# Patient Record
Sex: Female | Born: 1965
Health system: Southern US, Community
[De-identification: ages and names within clinical notes are randomized; demographics above are authoritative.]

## PROBLEM LIST (undated history)

## (undated) ENCOUNTER — Emergency Department (HOSPITAL_COMMUNITY): Admission: EM | Payer: Medicare Other | Source: Home / Self Care

## (undated) DIAGNOSIS — M109 Gout, unspecified: Secondary | ICD-10-CM

## (undated) DIAGNOSIS — R188 Other ascites: Secondary | ICD-10-CM

## (undated) DIAGNOSIS — J309 Allergic rhinitis, unspecified: Secondary | ICD-10-CM

## (undated) DIAGNOSIS — G2581 Restless legs syndrome: Secondary | ICD-10-CM

## (undated) DIAGNOSIS — F329 Major depressive disorder, single episode, unspecified: Secondary | ICD-10-CM

## (undated) DIAGNOSIS — I499 Cardiac arrhythmia, unspecified: Secondary | ICD-10-CM

## (undated) DIAGNOSIS — I10 Essential (primary) hypertension: Secondary | ICD-10-CM

## (undated) DIAGNOSIS — G250 Essential tremor: Secondary | ICD-10-CM

## (undated) DIAGNOSIS — F32A Depression, unspecified: Secondary | ICD-10-CM

## (undated) DIAGNOSIS — I4891 Unspecified atrial fibrillation: Secondary | ICD-10-CM

## (undated) DIAGNOSIS — G629 Polyneuropathy, unspecified: Secondary | ICD-10-CM

## (undated) DIAGNOSIS — F4310 Post-traumatic stress disorder, unspecified: Secondary | ICD-10-CM

## (undated) DIAGNOSIS — G43909 Migraine, unspecified, not intractable, without status migrainosus: Secondary | ICD-10-CM

## (undated) DIAGNOSIS — M1990 Unspecified osteoarthritis, unspecified site: Secondary | ICD-10-CM

## (undated) DIAGNOSIS — N1330 Unspecified hydronephrosis: Secondary | ICD-10-CM

## (undated) DIAGNOSIS — C189 Malignant neoplasm of colon, unspecified: Secondary | ICD-10-CM

## (undated) DIAGNOSIS — K5792 Diverticulitis of intestine, part unspecified, without perforation or abscess without bleeding: Secondary | ICD-10-CM

## (undated) DIAGNOSIS — F419 Anxiety disorder, unspecified: Secondary | ICD-10-CM

## (undated) DIAGNOSIS — K219 Gastro-esophageal reflux disease without esophagitis: Secondary | ICD-10-CM

## (undated) DIAGNOSIS — Z9221 Personal history of antineoplastic chemotherapy: Secondary | ICD-10-CM

## (undated) DIAGNOSIS — R600 Localized edema: Secondary | ICD-10-CM

## (undated) HISTORY — DX: Anxiety disorder, unspecified: F41.9

## (undated) HISTORY — PX: TUBAL LIGATION: SHX77

## (undated) HISTORY — DX: Other ascites: R18.8

## (undated) HISTORY — DX: Polyneuropathy, unspecified: G62.9

## (undated) HISTORY — DX: Allergic rhinitis, unspecified: J30.9

## (undated) HISTORY — DX: Migraine, unspecified, not intractable, without status migrainosus: G43.909

## (undated) HISTORY — DX: Localized edema: R60.0

## (undated) HISTORY — PX: OTHER SURGICAL HISTORY: SHX169

## (undated) HISTORY — PX: CARDIOVASCULAR STRESS TEST: SHX262

## (undated) HISTORY — DX: Depression, unspecified: F32.A

## (undated) HISTORY — DX: Major depressive disorder, single episode, unspecified: F32.9

## (undated) HISTORY — DX: Unspecified osteoarthritis, unspecified site: M19.90

## (undated) HISTORY — DX: Gastro-esophageal reflux disease without esophagitis: K21.9

## (undated) HISTORY — PX: APPENDECTOMY: SHX54

## (undated) HISTORY — DX: Gout, unspecified: M10.9

## (undated) HISTORY — DX: Unspecified hydronephrosis: N13.30

## (undated) HISTORY — DX: Post-traumatic stress disorder, unspecified: F43.10

## (undated) HISTORY — DX: Restless legs syndrome: G25.81

## (undated) HISTORY — PX: TRANSTHORACIC ECHOCARDIOGRAM: SHX275

## (undated) HISTORY — DX: Essential tremor: G25.0

## (undated) HISTORY — DX: Essential (primary) hypertension: I10

---

## 1999-09-04 ENCOUNTER — Emergency Department (HOSPITAL_COMMUNITY): Admission: EM | Admit: 1999-09-04 | Discharge: 1999-09-04 | Payer: Self-pay | Admitting: Emergency Medicine

## 2000-06-22 ENCOUNTER — Emergency Department (HOSPITAL_COMMUNITY): Admission: EM | Admit: 2000-06-22 | Discharge: 2000-06-22 | Payer: Self-pay | Admitting: Emergency Medicine

## 2000-06-22 ENCOUNTER — Encounter: Payer: Self-pay | Admitting: Emergency Medicine

## 2001-09-21 ENCOUNTER — Emergency Department (HOSPITAL_COMMUNITY): Admission: EM | Admit: 2001-09-21 | Discharge: 2001-09-21 | Payer: Self-pay | Admitting: Emergency Medicine

## 2002-07-07 ENCOUNTER — Emergency Department (HOSPITAL_COMMUNITY): Admission: EM | Admit: 2002-07-07 | Discharge: 2002-07-07 | Payer: Self-pay | Admitting: Emergency Medicine

## 2003-04-07 ENCOUNTER — Emergency Department (HOSPITAL_COMMUNITY): Admission: EM | Admit: 2003-04-07 | Discharge: 2003-04-07 | Payer: Self-pay | Admitting: Emergency Medicine

## 2003-06-28 ENCOUNTER — Emergency Department (HOSPITAL_COMMUNITY): Admission: EM | Admit: 2003-06-28 | Discharge: 2003-06-28 | Payer: Self-pay | Admitting: Emergency Medicine

## 2004-11-16 ENCOUNTER — Emergency Department (HOSPITAL_COMMUNITY): Admission: EM | Admit: 2004-11-16 | Discharge: 2004-11-16 | Payer: Self-pay | Admitting: Emergency Medicine

## 2005-01-28 ENCOUNTER — Emergency Department (HOSPITAL_COMMUNITY): Admission: EM | Admit: 2005-01-28 | Discharge: 2005-01-28 | Payer: Self-pay | Admitting: Emergency Medicine

## 2006-04-17 ENCOUNTER — Emergency Department: Payer: Self-pay | Admitting: Emergency Medicine

## 2006-07-02 ENCOUNTER — Emergency Department: Payer: Self-pay | Admitting: Unknown Physician Specialty

## 2007-01-18 ENCOUNTER — Emergency Department (HOSPITAL_COMMUNITY): Admission: EM | Admit: 2007-01-18 | Discharge: 2007-01-18 | Payer: Self-pay | Admitting: Emergency Medicine

## 2007-09-04 ENCOUNTER — Emergency Department (HOSPITAL_COMMUNITY): Admission: EM | Admit: 2007-09-04 | Discharge: 2007-09-04 | Payer: Self-pay | Admitting: Emergency Medicine

## 2008-04-22 ENCOUNTER — Emergency Department (HOSPITAL_COMMUNITY): Admission: EM | Admit: 2008-04-22 | Discharge: 2008-04-22 | Payer: Self-pay | Admitting: Emergency Medicine

## 2008-10-03 ENCOUNTER — Emergency Department (HOSPITAL_COMMUNITY): Admission: EM | Admit: 2008-10-03 | Discharge: 2008-10-03 | Payer: Self-pay | Admitting: Emergency Medicine

## 2008-10-09 ENCOUNTER — Emergency Department (HOSPITAL_COMMUNITY): Admission: EM | Admit: 2008-10-09 | Discharge: 2008-10-09 | Payer: Self-pay | Admitting: Emergency Medicine

## 2009-06-01 ENCOUNTER — Emergency Department (HOSPITAL_COMMUNITY): Admission: EM | Admit: 2009-06-01 | Discharge: 2009-06-01 | Payer: Self-pay | Admitting: Emergency Medicine

## 2010-05-21 LAB — TYPE AND SCREEN: Antibody Screen: NEGATIVE

## 2010-05-21 LAB — COMPREHENSIVE METABOLIC PANEL
ALT: 21 U/L (ref 0–35)
Alkaline Phosphatase: 53 U/L (ref 39–117)
CO2: 23 mEq/L (ref 19–32)
Calcium: 9.3 mg/dL (ref 8.4–10.5)
Creatinine, Ser: 0.68 mg/dL (ref 0.4–1.2)
GFR calc Af Amer: >60 mL/min (ref >60)
Potassium: 3.8 mEq/L (ref 3.5–5.1)
Total Bilirubin: 0.5 mg/dL (ref 0.3–1.2)

## 2010-05-21 LAB — URINALYSIS, ROUTINE W REFLEX MICROSCOPIC
Bilirubin Urine: NEGATIVE
Glucose, UA: NEGATIVE mg/dL
Leukocytes, UA: NEGATIVE
Nitrite: NEGATIVE
Protein, ur: NEGATIVE mg/dL

## 2010-05-21 LAB — POCT I-STAT, CHEM 8
Chloride: 111 mEq/L (ref 96–112)
Sodium: 144 mEq/L (ref 135–145)

## 2010-05-21 LAB — URINE MICROSCOPIC-ADD ON

## 2010-05-21 LAB — LACTIC ACID, PLASMA: Lactic Acid, Venous: 1.4 mmol/L (ref 0.5–2.2)

## 2010-05-21 LAB — CBC
MCHC: 34.4 g/dL (ref 30.0–36.0)
Platelets: 352 10*3/uL (ref 150–400)
RBC: 4.66 MIL/uL (ref 3.87–5.11)

## 2010-05-21 LAB — PROTIME-INR
INR: 1.04 (ref 0.00–1.49)
Prothrombin Time: 13.5 seconds (ref 11.6–15.2)

## 2010-05-21 LAB — APTT: aPTT: 23 seconds — ABNORMAL LOW (ref 24–37)

## 2010-06-07 LAB — URINALYSIS, ROUTINE W REFLEX MICROSCOPIC
Hgb urine dipstick: NEGATIVE
Ketones, ur: NEGATIVE mg/dL
Nitrite: NEGATIVE
Protein, ur: NEGATIVE mg/dL
Specific Gravity, Urine: 1.021 (ref 1.005–1.030)
Urobilinogen, UA: 0.2 mg/dL (ref 0.0–1.0)

## 2010-06-07 LAB — POCT I-STAT, CHEM 8
Calcium, Ion: 1.19 mmol/L (ref 1.12–1.32)
Creatinine, Ser: 0.9 mg/dL (ref 0.4–1.2)
Glucose, Bld: 92 mg/dL (ref 70–99)
HCT: 40 % (ref 36.0–46.0)
Hemoglobin: 13.6 g/dL (ref 12.0–15.0)
Potassium: 3.6 mEq/L (ref 3.5–5.1)
TCO2: 23 mmol/L (ref 0–100)

## 2010-06-07 LAB — POCT CARDIAC MARKERS: Myoglobin, poc: 52.9 ng/mL (ref 12–200)

## 2010-06-15 ENCOUNTER — Emergency Department (HOSPITAL_COMMUNITY)
Admission: EM | Admit: 2010-06-15 | Discharge: 2010-06-15 | Disposition: A | Discharge status: Home / Self Care | Payer: Self-pay | Attending: Emergency Medicine | Admitting: Emergency Medicine

## 2010-06-15 DIAGNOSIS — R0989 Other specified symptoms and signs involving the circulatory and respiratory systems: Secondary | ICD-10-CM | POA: Insufficient documentation

## 2010-06-15 DIAGNOSIS — R0609 Other forms of dyspnea: Secondary | ICD-10-CM | POA: Insufficient documentation

## 2010-06-15 DIAGNOSIS — R112 Nausea with vomiting, unspecified: Secondary | ICD-10-CM | POA: Insufficient documentation

## 2010-06-15 DIAGNOSIS — J45909 Unspecified asthma, uncomplicated: Secondary | ICD-10-CM | POA: Insufficient documentation

## 2010-06-15 DIAGNOSIS — R059 Cough, unspecified: Secondary | ICD-10-CM | POA: Insufficient documentation

## 2010-06-15 DIAGNOSIS — R05 Cough: Secondary | ICD-10-CM | POA: Insufficient documentation

## 2010-06-15 DIAGNOSIS — J3489 Other specified disorders of nose and nasal sinuses: Secondary | ICD-10-CM | POA: Insufficient documentation

## 2010-06-15 DIAGNOSIS — R070 Pain in throat: Secondary | ICD-10-CM | POA: Insufficient documentation

## 2010-06-15 DIAGNOSIS — R509 Fever, unspecified: Secondary | ICD-10-CM | POA: Insufficient documentation

## 2011-02-18 ENCOUNTER — Ambulatory Visit (HOSPITAL_COMMUNITY)
Admission: EM | Admit: 2011-02-18 | Discharge: 2011-02-18 | Discharge status: Against Medical Advice | Payer: Managed Care, Other (non HMO) | Attending: Emergency Medicine | Admitting: Emergency Medicine

## 2011-02-18 ENCOUNTER — Other Ambulatory Visit: Payer: Self-pay

## 2011-02-18 DIAGNOSIS — R079 Chest pain, unspecified: Secondary | ICD-10-CM | POA: Insufficient documentation

## 2011-02-19 ENCOUNTER — Emergency Department (INDEPENDENT_AMBULATORY_CARE_PROVIDER_SITE_OTHER)
Admission: EM | Admit: 2011-02-19 | Discharge: 2011-02-19 | Disposition: A | Discharge status: Home / Self Care | Payer: Managed Care, Other (non HMO) | Source: Home / Self Care | Attending: Family Medicine | Admitting: Family Medicine

## 2011-02-19 ENCOUNTER — Encounter: Payer: Self-pay | Admitting: Emergency Medicine

## 2011-02-19 DIAGNOSIS — R6889 Other general symptoms and signs: Secondary | ICD-10-CM

## 2011-02-19 HISTORY — DX: Unspecified atrial fibrillation: I48.91

## 2011-02-19 MED ORDER — ONDANSETRON HCL 4 MG PO TABS: 4.0000 mg | ORAL_TABLET | Freq: Three times a day (TID) | ORAL | Status: AC | PRN

## 2011-02-19 MED ORDER — ONDANSETRON 4 MG PO TBDP
ORAL_TABLET | ORAL | Status: AC
  Filled 2011-02-19: qty 1

## 2011-02-19 MED ORDER — GUAIFENESIN-CODEINE 100-10 MG/5ML PO SYRP: 5.0000 mL | ORAL_SOLUTION | Freq: Four times a day (QID) | ORAL | Status: AC | PRN

## 2011-02-19 MED ORDER — FLUTICASONE PROPIONATE 50 MCG/ACT NA SUSP: 2.0000 | Freq: Every day | NASAL | Status: DC

## 2011-02-19 MED ORDER — ONDANSETRON 4 MG PO TBDP
4.0000 mg | ORAL_TABLET | Freq: Once | ORAL | Status: AC
  Administered 2011-02-19: 4 mg via ORAL

## 2011-02-19 MED ORDER — ONDANSETRON HCL 4 MG PO TABS: 4.0000 mg | ORAL_TABLET | Freq: Three times a day (TID) | ORAL | Status: DC | PRN

## 2011-02-19 NOTE — ED Notes (Signed)
PT HERE WITH FLU LIKE SX WITHOUT FEVERS THAT STARTED SUDDEN YESTERDAY.VOMITING WITH ANY FOOD/FLUIDS AND NO DIARRHEA.BIALT EARS ARE HURTING AND BODY ACHES ALL OVER REPORTED.PT STATES GRANDCHILD HAS FLU.AFEBRILE TODAY.LAST ATE YESTERDAY AM

## 2011-02-19 NOTE — ED Provider Notes (Signed)
History     CSN: [id number]  Arrival date & time 02/19/11  1014   First MD Initiated Contact with Patient 02/19/11 1131      Chief Complaint  Patient presents with  . Influenza  . Emesis    (Consider location/radiation/quality/duration/timing/severity/associated sxs/prior treatment) HPI Comments: Sheri Calderon presents for evaluation of nausea, vomiting, decreased PO tolerance, body aches, cough, and bilateral ear pain. She reports grandson, with flu, as sick contact.   Patient is a 45 y.o. female presenting with flu symptoms and vomiting. The history is provided by the patient.  Influenza This is a new problem. The current episode started 2 days ago. The problem occurs constantly. Associated symptoms include headaches. The symptoms are aggravated by nothing. The symptoms are relieved by nothing.  Emesis  The current episode started yesterday. The problem occurs 2 to 4 times per day. The problem has not changed since onset.There has been no fever. Associated symptoms include cough, headaches and myalgias.    Past Medical History  Diagnosis Date  . Atrial fibrillation     History reviewed. No pertinent past surgical history.  Family History  Problem Relation Age of Onset  . Diabetes Other   . Cancer Other     History  Substance Use Topics  . Smoking status: Current Everyday Smoker  . Smokeless tobacco: Not on file  . Alcohol Use: No    OB History    Grav Para Term Preterm Abortions TAB SAB Ect Mult Living                  Review of Systems  Constitutional: Positive for appetite change.  HENT: Positive for congestion and rhinorrhea.   Eyes: Negative.   Respiratory: Positive for cough.   Cardiovascular: Negative.   Gastrointestinal: Positive for nausea and vomiting.  Genitourinary: Negative.   Musculoskeletal: Positive for myalgias.  Skin: Negative.   Neurological: Positive for headaches.    Allergies  Review of patient's allergies indicates no known  allergies.  Home Medications   Current Outpatient Rx  Name Route Sig Dispense Refill  . DILTIAZEM HCL 120 MG PO TABS Oral Take 120 mg by mouth 1 day or 1 dose.      Marland Kitchen FLUTICASONE PROPIONATE 50 MCG/ACT NA SUSP Nasal Place 2 sprays into the nose daily. 16 g 2  . GUAIFENESIN-CODEINE 100-10 MG/5ML PO SYRP Oral Take 5 mLs by mouth every 6 (six) hours as needed for cough or congestion. 120 mL 0  . ONDANSETRON HCL 4 MG PO TABS Oral Take 1 tablet (4 mg total) by mouth every 8 (eight) hours as needed for nausea. 15 tablet 0    BP 111/71  Pulse 110  Temp(Src) 98.2 F (36.8 C) (Oral)  Resp 16  SpO2 100%  LMP 02/14/2011  Physical Exam  Nursing note and vitals reviewed. Constitutional: She is oriented to person, place, and time. She appears well-developed and well-nourished.  HENT:  Head: Normocephalic and atraumatic.  Right Ear: Tympanic membrane is retracted.  Left Ear: Tympanic membrane is retracted.  Mouth/Throat: Uvula is midline, oropharynx is clear and moist and mucous membranes are normal.  Eyes: EOM are normal.  Neck: Normal range of motion.  Pulmonary/Chest: Effort normal and breath sounds normal. She has no wheezes. She has no rhonchi. She has no rales.  Musculoskeletal: Normal range of motion.  Neurological: She is alert and oriented to person, place, and time.  Skin: Skin is warm and dry.  Psychiatric: Her behavior is normal.    ED  Course  Procedures (including critical care time)  Labs Reviewed - No data to display No results found.   1. Influenza-like illness       MDM  Given rx for guaifenesin AC, fluticasone for congestion and cough, and ondansetron for nausea and vomiting; tolerated PO after administration of ondansetron in clinic        Marcell Anger, MD 02/19/11 1313

## 2011-02-19 NOTE — ED Notes (Signed)
PT WAS ABLE TO KEEP GINGER ALE DOWN WITH VOMITING

## 2011-04-27 ENCOUNTER — Institutional Professional Consult (permissible substitution): Payer: Managed Care, Other (non HMO) | Admitting: Cardiology

## 2011-05-20 ENCOUNTER — Institutional Professional Consult (permissible substitution): Payer: Managed Care, Other (non HMO) | Admitting: Cardiology

## 2012-03-02 DIAGNOSIS — C189 Malignant neoplasm of colon, unspecified: Secondary | ICD-10-CM

## 2012-03-02 HISTORY — DX: Malignant neoplasm of colon, unspecified: C18.9

## 2012-03-25 ENCOUNTER — Emergency Department (INDEPENDENT_AMBULATORY_CARE_PROVIDER_SITE_OTHER)
Admission: EM | Admit: 2012-03-25 | Discharge: 2012-03-25 | Disposition: A | Discharge status: Home / Self Care | Payer: Self-pay | Source: Home / Self Care | Attending: Family Medicine | Admitting: Family Medicine

## 2012-03-25 ENCOUNTER — Encounter (HOSPITAL_COMMUNITY): Payer: Self-pay

## 2012-03-25 DIAGNOSIS — J069 Acute upper respiratory infection, unspecified: Secondary | ICD-10-CM

## 2012-03-25 MED ORDER — IPRATROPIUM BROMIDE 0.06 % NA SOLN: 2.0000 | Freq: Four times a day (QID) | NASAL | Status: DC

## 2012-03-25 MED ORDER — HYDROCOD POLST-CHLORPHEN POLST 10-8 MG/5ML PO LQCR: 5.0000 mL | Freq: Two times a day (BID) | ORAL | Status: DC | PRN

## 2012-03-25 NOTE — ED Provider Notes (Signed)
History     CSN: [id number]  Arrival date & time 03/25/12  1316   First MD Initiated Contact with Patient 03/25/12 1447      Chief Complaint  Patient presents with  . URI    (Consider location/radiation/quality/duration/timing/severity/associated sxs/prior treatment) Patient is a 47 y.o. female presenting with URI. The history is provided by the patient.  URI The primary symptoms include cough and myalgias. Primary symptoms do not include fever, fatigue, sore throat, wheezing, abdominal pain, nausea or vomiting. The current episode started 3 to 5 days ago. This is a new problem. The problem has not changed since onset. The onset of the illness is associated with exposure to sick contacts. Symptoms associated with the illness include chills, congestion and rhinorrhea.    Past Medical History  Diagnosis Date  . Atrial fibrillation   . Lower extremity edema   . Restless legs syndrome     Past Surgical History  Procedure Date  . Tubal ligation     Family History  Problem Relation Age of Onset  . Cancer Mother   . Diabetes Mother   . Heart disease Mother   . COPD Father   . Hypertension Father   . Hyperlipidemia Father     History  Substance Use Topics  . Smoking status: Current Every Day Smoker  . Smokeless tobacco: Not on file  . Alcohol Use: No    OB History    Grav Para Term Preterm Abortions TAB SAB Ect Mult Living                  Review of Systems  Constitutional: Positive for chills. Negative for fever and fatigue.  HENT: Positive for congestion and rhinorrhea. Negative for sore throat.   Respiratory: Positive for cough. Negative for shortness of breath and wheezing.   Cardiovascular: Negative.   Gastrointestinal: Negative.  Negative for nausea, vomiting and abdominal pain.  Genitourinary: Negative.   Musculoskeletal: Positive for myalgias.    Allergies  Review of patient's allergies indicates no known allergies.  Home Medications   Current  Outpatient Rx  Name  Route  Sig  Dispense  Refill  . HYDROCOD POLST-CPM POLST ER 10-8 MG/5ML PO LQCR   Oral   Take 5 mLs by mouth every 12 (twelve) hours as needed.   115 mL   0   . DILTIAZEM HCL 120 MG PO TABS   Oral   Take 120 mg by mouth 1 day or 1 dose.           Marland Kitchen FAMOTIDINE 20 MG PO TABS   Oral   Take 20 mg by mouth 2 (two) times daily.         Marland Kitchen FLUTICASONE PROPIONATE 50 MCG/ACT NA SUSP   Nasal   Place 2 sprays into the nose daily.   16 g   2   . HYDROCHLOROTHIAZIDE 25 MG PO TABS   Oral   Take 25 mg by mouth daily.         . IPRATROPIUM BROMIDE 0.06 % NA SOLN   Nasal   Place 2 sprays into the nose 4 (four) times daily.   15 mL   1   . LORAZEPAM 1 MG PO TABS   Oral   Take 1 mg by mouth every 8 (eight) hours.         Marland Kitchen ROPINIROLE HCL 0.5 MG PO TABS   Oral   Take 0.5 mg by mouth 2 (two) times daily.         Marland Kitchen  TRAMADOL HCL 50 MG PO TABS   Oral   Take 50 mg by mouth 2 (two) times daily.         . TRIAMCINOLONE ACETONIDE 0.1 % EX CREA   Topical   Apply topically 2 (two) times daily.           BP 130/91  Pulse 84  Temp 97.2 F (36.2 C) (Oral)  Resp 20  SpO2 97%  Physical Exam  Nursing note and vitals reviewed. Constitutional: She is oriented to person, place, and time. She appears well-developed and well-nourished.  HENT:  Head: Normocephalic.  Right Ear: External ear normal.  Left Ear: External ear normal.  Nose: Mucosal edema and rhinorrhea present.  Mouth/Throat: Oropharynx is clear and moist.  Eyes: Pupils are equal, round, and reactive to light.  Neck: Neck supple.  Cardiovascular: Normal rate, regular rhythm, normal heart sounds and intact distal pulses.   Pulmonary/Chest: Effort normal and breath sounds normal.  Abdominal: Soft. Bowel sounds are normal.  Lymphadenopathy:    She has no cervical adenopathy.  Neurological: She is alert and oriented to person, place, and time.  Skin: Skin is warm and dry.    ED Course    Procedures (including critical care time)  Labs Reviewed - No data to display No results found.   1. URI (upper respiratory infection)       MDM          Billy Fischer, MD 03/25/12 1459

## 2012-03-25 NOTE — ED Notes (Signed)
Reportedly has been ill w flu like syx for 1 week, minimal relief w OTC medications

## 2012-06-07 ENCOUNTER — Inpatient Hospital Stay (HOSPITAL_COMMUNITY)
Admission: EM | Admit: 2012-06-07 | Discharge: 2012-06-10 | DRG: 182 | Disposition: A | Discharge status: Home / Self Care | Payer: BC Managed Care – PPO | Attending: Internal Medicine | Admitting: Internal Medicine

## 2012-06-07 ENCOUNTER — Encounter (HOSPITAL_COMMUNITY): Payer: Self-pay | Admitting: Emergency Medicine

## 2012-06-07 DIAGNOSIS — F17200 Nicotine dependence, unspecified, uncomplicated: Secondary | ICD-10-CM | POA: Diagnosis present

## 2012-06-07 DIAGNOSIS — J449 Chronic obstructive pulmonary disease, unspecified: Secondary | ICD-10-CM | POA: Diagnosis present

## 2012-06-07 DIAGNOSIS — J4489 Other specified chronic obstructive pulmonary disease: Secondary | ICD-10-CM | POA: Diagnosis present

## 2012-06-07 DIAGNOSIS — D72829 Elevated white blood cell count, unspecified: Secondary | ICD-10-CM | POA: Diagnosis present

## 2012-06-07 DIAGNOSIS — K5732 Diverticulitis of large intestine without perforation or abscess without bleeding: Principal | ICD-10-CM | POA: Diagnosis present

## 2012-06-07 DIAGNOSIS — E871 Hypo-osmolality and hyponatremia: Secondary | ICD-10-CM | POA: Diagnosis present

## 2012-06-07 DIAGNOSIS — R109 Unspecified abdominal pain: Secondary | ICD-10-CM | POA: Diagnosis present

## 2012-06-07 DIAGNOSIS — IMO0002 Reserved for concepts with insufficient information to code with codable children: Secondary | ICD-10-CM

## 2012-06-07 DIAGNOSIS — K630 Abscess of intestine: Secondary | ICD-10-CM | POA: Diagnosis present

## 2012-06-07 DIAGNOSIS — K5792 Diverticulitis of intestine, part unspecified, without perforation or abscess without bleeding: Secondary | ICD-10-CM | POA: Diagnosis present

## 2012-06-07 LAB — CBC WITH DIFFERENTIAL/PLATELET
Basophils Absolute: 0 10*3/uL (ref 0.0–0.1)
Eosinophils Absolute: 0.3 10*3/uL (ref 0.0–0.7)
Eosinophils Relative: 2 % (ref 0–5)
MCH: 29.1 pg (ref 26.0–34.0)
MCV: 84.4 fL (ref 78.0–100.0)
Monocytes Absolute: 0.9 10*3/uL (ref 0.1–1.0)
Platelets: 393 10*3/uL (ref 150–400)
RDW: 12.9 % (ref 11.5–15.5)

## 2012-06-07 LAB — COMPREHENSIVE METABOLIC PANEL
ALT: 9 U/L (ref 0–35)
AST: 10 U/L (ref 0–37)
Calcium: 9.3 mg/dL (ref 8.4–10.5)
Creatinine, Ser: 0.82 mg/dL (ref 0.50–1.10)
GFR calc Af Amer: >90 mL/min (ref >90)
Glucose, Bld: 99 mg/dL (ref 70–99)
Sodium: 133 mEq/L — ABNORMAL LOW (ref 135–145)
Total Protein: 7.3 g/dL (ref 6.0–8.3)

## 2012-06-07 LAB — URINE MICROSCOPIC-ADD ON

## 2012-06-07 LAB — URINALYSIS, ROUTINE W REFLEX MICROSCOPIC
Bilirubin Urine: NEGATIVE
Hgb urine dipstick: NEGATIVE
Ketones, ur: NEGATIVE mg/dL
Protein, ur: NEGATIVE mg/dL
Urobilinogen, UA: 1 mg/dL (ref 0.0–1.0)

## 2012-06-07 MED ORDER — HYDROMORPHONE HCL PF 1 MG/ML IJ SOLN
1.0000 mg | Freq: Once | INTRAMUSCULAR | Status: AC
  Administered 2012-06-07: 1 mg via INTRAVENOUS
  Filled 2012-06-07: qty 1

## 2012-06-07 MED ORDER — IOHEXOL 300 MG/ML  SOLN
50.0000 mL | Freq: Once | INTRAMUSCULAR | Status: AC | PRN
  Administered 2012-06-07: 50 mL via ORAL

## 2012-06-07 MED ORDER — SODIUM CHLORIDE 0.9 % IV SOLN
1000.0000 mL | Freq: Once | INTRAVENOUS | Status: AC
  Administered 2012-06-07: 1000 mL via INTRAVENOUS

## 2012-06-07 MED ORDER — SODIUM CHLORIDE 0.9 % IV SOLN
1000.0000 mL | INTRAVENOUS | Status: DC
  Administered 2012-06-08: 1000 mL via INTRAVENOUS

## 2012-06-07 MED ORDER — ONDANSETRON HCL 4 MG/2ML IJ SOLN
4.0000 mg | Freq: Once | INTRAMUSCULAR | Status: AC
  Administered 2012-06-07: 4 mg via INTRAVENOUS
  Filled 2012-06-07: qty 2

## 2012-06-07 NOTE — ED Notes (Signed)
Pt drove herself to ED

## 2012-06-07 NOTE — ED Notes (Signed)
Pt presents with RLQ and lower back pain that began 2 days ago.  Pt admits to abdomen being distended- RLQ tender to palpation.  Denies any medical hx- states her mother passed away a month ago from stomach cancer.  Pt denies any N/V/D, denies vaginal discharge.  Pt undressed in gown.  Will continue to monitor pt.

## 2012-06-07 NOTE — ED Notes (Signed)
PT. REPORTS LOW BACK AND LOW ABDOMINAL PAIN ONSET 2 DAYS AGO , DENIES NAUSEA/VOMITTING OR DIARRHEA.  NO DYSURIA OR HEMATURIA . PT. ALSO REPORTS ABDOMINAL DISTENTION .

## 2012-06-08 ENCOUNTER — Encounter (HOSPITAL_COMMUNITY): Payer: Self-pay | Admitting: Radiology

## 2012-06-08 ENCOUNTER — Emergency Department (HOSPITAL_COMMUNITY): Payer: BC Managed Care – PPO

## 2012-06-08 DIAGNOSIS — K5732 Diverticulitis of large intestine without perforation or abscess without bleeding: Secondary | ICD-10-CM

## 2012-06-08 DIAGNOSIS — R109 Unspecified abdominal pain: Secondary | ICD-10-CM | POA: Diagnosis present

## 2012-06-08 DIAGNOSIS — D72829 Elevated white blood cell count, unspecified: Secondary | ICD-10-CM | POA: Diagnosis present

## 2012-06-08 DIAGNOSIS — K5792 Diverticulitis of intestine, part unspecified, without perforation or abscess without bleeding: Secondary | ICD-10-CM | POA: Diagnosis present

## 2012-06-08 DIAGNOSIS — E871 Hypo-osmolality and hyponatremia: Secondary | ICD-10-CM | POA: Diagnosis present

## 2012-06-08 LAB — CBC
HCT: 34.9 % — ABNORMAL LOW (ref 36.0–46.0)
MCH: 28.8 pg (ref 26.0–34.0)
MCV: 84.5 fL (ref 78.0–100.0)
Platelets: 328 10*3/uL (ref 150–400)
RBC: 4.13 MIL/uL (ref 3.87–5.11)

## 2012-06-08 LAB — BASIC METABOLIC PANEL
CO2: 24 mEq/L (ref 19–32)
Calcium: 8.3 mg/dL — ABNORMAL LOW (ref 8.4–10.5)
Chloride: 105 mEq/L (ref 96–112)
Creatinine, Ser: 0.82 mg/dL (ref 0.50–1.10)
Glucose, Bld: 95 mg/dL (ref 70–99)
Sodium: 136 mEq/L (ref 135–145)

## 2012-06-08 MED ORDER — PIPERACILLIN-TAZOBACTAM 3.375 G IVPB
3.3750 g | Freq: Once | INTRAVENOUS | Status: AC
  Administered 2012-06-08: 3.375 g via INTRAVENOUS
  Filled 2012-06-08: qty 50

## 2012-06-08 MED ORDER — SODIUM CHLORIDE 0.9 % IV SOLN
1000.0000 mL | INTRAVENOUS | Status: DC
  Administered 2012-06-08 – 2012-06-09 (×2): 1000 mL via INTRAVENOUS

## 2012-06-08 MED ORDER — ACETAMINOPHEN 325 MG PO TABS
650.0000 mg | ORAL_TABLET | Freq: Four times a day (QID) | ORAL | Status: DC | PRN
  Administered 2012-06-10: 650 mg via ORAL
  Filled 2012-06-08: qty 2

## 2012-06-08 MED ORDER — METRONIDAZOLE IN NACL 5-0.79 MG/ML-% IV SOLN
500.0000 mg | Freq: Three times a day (TID) | INTRAVENOUS | Status: DC
  Administered 2012-06-08 – 2012-06-09 (×4): 500 mg via INTRAVENOUS
  Filled 2012-06-08 (×8): qty 100

## 2012-06-08 MED ORDER — DIPHENHYDRAMINE HCL 50 MG/ML IJ SOLN
12.5000 mg | Freq: Four times a day (QID) | INTRAMUSCULAR | Status: DC | PRN
  Filled 2012-06-08: qty 1

## 2012-06-08 MED ORDER — ONDANSETRON HCL 4 MG/2ML IJ SOLN
INTRAMUSCULAR | Status: AC
  Administered 2012-06-08: 4 mg
  Filled 2012-06-08: qty 2

## 2012-06-08 MED ORDER — ONDANSETRON HCL 4 MG PO TABS: 4.0000 mg | ORAL_TABLET | Freq: Four times a day (QID) | ORAL | Status: DC | PRN

## 2012-06-08 MED ORDER — CIPROFLOXACIN IN D5W 400 MG/200ML IV SOLN
400.0000 mg | Freq: Two times a day (BID) | INTRAVENOUS | Status: DC
  Administered 2012-06-08 – 2012-06-09 (×4): 400 mg via INTRAVENOUS
  Filled 2012-06-08 (×5): qty 200

## 2012-06-08 MED ORDER — HYDROMORPHONE HCL PF 1 MG/ML IJ SOLN
1.0000 mg | Freq: Once | INTRAMUSCULAR | Status: AC
  Administered 2012-06-08: 1 mg via INTRAVENOUS
  Filled 2012-06-08: qty 1

## 2012-06-08 MED ORDER — MORPHINE SULFATE 2 MG/ML IJ SOLN
2.0000 mg | INTRAMUSCULAR | Status: DC | PRN
  Administered 2012-06-08 – 2012-06-09 (×3): 2 mg via INTRAVENOUS
  Filled 2012-06-08 (×3): qty 1

## 2012-06-08 MED ORDER — ENOXAPARIN SODIUM 40 MG/0.4ML ~~LOC~~ SOLN
40.0000 mg | SUBCUTANEOUS | Status: DC
  Administered 2012-06-08 – 2012-06-09 (×2): 40 mg via SUBCUTANEOUS
  Filled 2012-06-08 (×3): qty 0.4

## 2012-06-08 MED ORDER — ONDANSETRON HCL 4 MG/2ML IJ SOLN
4.0000 mg | Freq: Four times a day (QID) | INTRAMUSCULAR | Status: DC | PRN
  Filled 2012-06-08: qty 2

## 2012-06-08 MED ORDER — IOHEXOL 300 MG/ML  SOLN
100.0000 mL | Freq: Once | INTRAMUSCULAR | Status: AC | PRN
  Administered 2012-06-08: 100 mL via INTRAVENOUS

## 2012-06-08 MED ORDER — PROMETHAZINE HCL 25 MG/ML IJ SOLN
12.5000 mg | Freq: Four times a day (QID) | INTRAMUSCULAR | Status: DC | PRN
  Administered 2012-06-08: 12.5 mg via INTRAVENOUS
  Filled 2012-06-08: qty 1

## 2012-06-08 MED ORDER — ACETAMINOPHEN 650 MG RE SUPP: 650.0000 mg | Freq: Four times a day (QID) | RECTAL | Status: DC | PRN

## 2012-06-08 MED ORDER — HYDROMORPHONE HCL PF 1 MG/ML IJ SOLN: 0.5000 mg | INTRAMUSCULAR | Status: DC | PRN

## 2012-06-08 NOTE — ED Provider Notes (Signed)
Case is discussed with Dr. Reece Levy of [facility] who agrees to admit the patient.   Delora Fuel, MD 17/00/17 [id number]

## 2012-06-08 NOTE — Progress Notes (Signed)
[facility] PROGRESS NOTE  Sheri LEHNEN KCM:[id number] DOB: 01/27/1966 DOA: 06/07/2012 PCP: Default, Provider, MD   Brief Narrative: 47 year old female presenting with abdominal pain for last few weeks which was worse on the day of admission with COPD and over bilateral lower quadrant. CT of the abdomen done in the ED showed a distal sigmoid diverticulitis with 2 cm pericolonic abscess.   Assessment/Plan: Sigmoid diverticulitis with pericolonic abscess Patient kept n.p.o. Continue IV hydration Started empirically on ciprofloxacin and Flagyl Appreciate surgical consult. Recommend the abscess to be quite small be drained. Recommend to continue current antibiotics. If clinically improved to start years tomorrow -Continue pain control. -Serial abdominal exam.  Remote history of A. fib Not on any medications. In sinus rhythm  DVT prophylaxis Subcutaneous Lovenox     Code Status: Full code Family Communication: None at Bedside Disposition Plan: Home once clinically improved.   Consultants:  General surgery  Procedures:  None  Antibiotics:  IV ciprofloxacin and Flagyl (day 1)  HPI/Subjective: Admission H&P view. Patient complains of bilateral lower quadrant pain. Denies nausea or vomiting.  Objective: Filed Vitals:   06/08/12 0116 06/08/12 0245 06/08/12 0300 06/08/12 0507  BP: 137/98  121/78 133/83  Pulse: 79  81 75  Temp:   98.6 F (37 C) 98.6 F (37 C)  TempSrc:   Oral Oral  Resp: 20  19 19   Height:  5' 7"  (1.702 m)    Weight:  83.462 kg (184 lb)    SpO2: 99%  99% 98%    Intake/Output Summary (Last 24 hours) at 06/08/12 1130 Last data filed at 06/08/12 0600  Gross per 24 hour  Intake 296.25 ml  Output      0 ml  Net 296.25 ml   Filed Weights   06/08/12 0245  Weight: 83.462 kg (184 lb)    Exam:   General: Middle-aged female lying in bed in no acute distress  HEENT: No pallor, moist oral mucosa  CVS: Normal S1 and S2, no murmurs rubs  or gallop  Chest: Clear to auscultation bilaterally , no added sounds  Abdomen: Soft, nondistended, tender to palpation over bilateral lower quadrants,  bowel sounds present  Extremities: Warm, no edema  CNS: AAO x3   Data Reviewed: Basic Metabolic Panel:  Recent Labs Lab 06/07/12 1938 06/08/12 0640  NA 133* 136  K 4.3 3.8  CL 100 105  CO2 25 24  GLUCOSE 99 95  BUN 12 9  CREATININE 0.82 0.82  CALCIUM 9.3 8.3*   Liver Function Tests:  Recent Labs Lab 06/07/12 1938  AST 10  ALT 9  ALKPHOS 63  BILITOT 0.3  PROT 7.3  ALBUMIN 3.5    Recent Labs Lab 06/07/12 1938  LIPASE 21   No results found for this basename: AMMONIA,  in the last 168 hours CBC:  Recent Labs Lab 06/07/12 1938 06/08/12 0640  WBC 12.9* 10.9*  NEUTROABS 9.3*  --   HGB 13.4 11.9*  HCT 38.9 34.9*  MCV 84.4 84.5  PLT 393 328   Cardiac Enzymes: No results found for this basename: CKTOTAL, CKMB, CKMBINDEX, TROPONINI,  in the last 168 hours BNP (last 3 results) No results found for this basename: PROBNP,  in the last 8760 hours CBG: No results found for this basename: GLUCAP,  in the last 168 hours  No results found for this or any previous visit (from the past 240 hour(s)).   Studies: Ct Abdomen Pelvis W Contrast  06/08/2012  *RADIOLOGY REPORT*  Clinical Data: Right-sided abdominal pain extending into the back.  CT ABDOMEN AND PELVIS WITH CONTRAST  Technique:  Multidetector CT imaging of the abdomen and pelvis was performed following the standard protocol during bolus administration of intravenous contrast.  Contrast: 160m OMNIPAQUE IOHEXOL 300 MG/ML  SOLN  Comparison: CT abdomen and pelvis with contrast 06/01/2009.  Findings: The lung bases are clear without focal nodule, mass, or airspace disease.  The heart size is normal.  No significant pleural or pericardial effusion is present.  Fatty infiltration of the liver is suspected.  No discrete lesions are present.  The spleen is unremarkable.   The stomach, duodenum, and pancreas are within normal limits.  The common bile duct and gallbladder are normal.  The adrenal glands are normal bilaterally.  Parapelvic renal cyst bilaterally there is much more prominent than on the prior study.  The collecting system ureters are within normal limits.  The urinary bladder is unremarkable.  Extensive inflammatory changes surround the distal sigmoid colon. A focal contained perforation or developing abscess is seen just anterior to the area of diverticulitis, measuring 16 x 20 x 13 mm. The collection contains gas.  A small amount of free fluid is present.  The remainder of the colon is unremarkable.  The appendix is visualized and within normal limits.  The small bowel is normal. The uterus and adnexa are normal for age.  Mild degenerative changes are noted in the lower lumbar facets. The bone windows are otherwise unremarkable.  IMPRESSION:  1.  Distal sigmoid diverticulitis. 2.  A 2 cm pericolonic abscess is noted anteriorly. 3.  Minimal free fluid is layering. 4.  Bilateral parapelvic kidney cysts are slightly more conspicuous than on the prior exam.  There is no obstruction. 5.  Probable fatty infiltration of the liver.  Critical Value/emergent results were called by telephone at the time of interpretation on 06/09/2038 at 1 o'clock a.m. to Dr. CVenora Maples who verbally acknowledged these results.   Original Report Authenticated By: CSan Morelle M.D.     Scheduled Meds: . ciprofloxacin  400 mg Intravenous Q12H  . enoxaparin (LOVENOX) injection  40 mg Subcutaneous Q24H  . metronidazole  500 mg Intravenous Q8H   Continuous Infusions: . sodium chloride 1,000 mL (06/08/12 0600)      Time spent: 20 minutes    Shanon Seawright  [facility] Pager [phone] If 7PM-7AM, please contact night-coverage at [email or web address], password [email or web address]/10/2012, 11:30 AM  LOS: 1 day

## 2012-06-08 NOTE — Consult Note (Signed)
Agree with above. Discussed indications for surgery as well as need for colonoscopy.

## 2012-06-08 NOTE — ED Provider Notes (Signed)
History     CSN: [id number]  Arrival date & time 06/07/12  1904   First MD Initiated Contact with Patient 06/07/12 2027      Chief Complaint  Patient presents with  . Abdominal Pain  . Back Pain    (Consider location/radiation/quality/duration/timing/severity/associated sxs/prior treatment) The history is provided by the patient.   patient reports right-sided abdominal pain worsening over the past several days.  She denies nausea vomiting.  She denies fevers and chills.  She reports her pain is worse with palpation and movement.  She's also developed some new abdominal distention.  She has a history of atrial fibrillation and peripheral edema.  She denies a history of GI issues.  No prior abdominal surgery except for a bilateral tubal ligation.  No vaginal complaints.  No urinary complaints.  Her pain is moderate to severe at this time.  Past Medical History  Diagnosis Date  . Atrial fibrillation   . Lower extremity edema   . Restless legs syndrome     Past Surgical History  Procedure Laterality Date  . Tubal ligation      Family History  Problem Relation Age of Onset  . Cancer Mother   . Diabetes Mother   . Heart disease Mother   . COPD Father   . Hypertension Father   . Hyperlipidemia Father     History  Substance Use Topics  . Smoking status: Current Every Day Smoker  . Smokeless tobacco: Not on file  . Alcohol Use: No    OB History   Grav Para Term Preterm Abortions TAB SAB Ect Mult Living                  Review of Systems  All other systems reviewed and are negative.    Allergies  Review of patient's allergies indicates no known allergies.  Home Medications   Current Outpatient Rx  Name  Route  Sig  Dispense  Refill  . naproxen sodium (ANAPROX) 220 MG tablet   Oral   Take 440 mg by mouth every 3 (three) hours as needed (for stomack, back pain).           BP 121/75  Pulse 77  Temp(Src) 97.6 F (36.4 C) (Oral)  Resp 19  SpO2 98%  LMP  04/13/2012  Physical Exam  Nursing note and vitals reviewed. Constitutional: She is oriented to person, place, and time. She appears well-developed and well-nourished. No distress.  HENT:  Head: Normocephalic and atraumatic.  Eyes: EOM are normal.  Neck: Normal range of motion.  Cardiovascular: Normal rate, regular rhythm and normal heart sounds.   Pulmonary/Chest: Effort normal and breath sounds normal.  Abdominal: Soft. She exhibits no distension.  Right-sided abdominal tenderness with guarding no rebound.  No peritonitis.  Her pain on her right side of her abdomen seems to be slightly more severe in the right lower quadrant versus the right upper quadrant.  Also the right lateral abdominal wall tenderness.  No frank right flank tenderness.  Musculoskeletal: Normal range of motion.  Neurological: She is alert and oriented to person, place, and time.  Skin: Skin is warm and dry.  Psychiatric: She has a normal mood and affect. Judgment normal.    ED Course  Procedures (including critical care time)  Labs Reviewed  URINALYSIS, ROUTINE W REFLEX MICROSCOPIC - Abnormal; Notable for the following:    APPearance TURBID (*)    pH 8.5 (*)    Leukocytes, UA TRACE (*)    All  other components within normal limits  CBC WITH DIFFERENTIAL - Abnormal; Notable for the following:    WBC 12.9 (*)    Neutro Abs 9.3 (*)    All other components within normal limits  COMPREHENSIVE METABOLIC PANEL - Abnormal; Notable for the following:    Sodium 133 (*)    GFR calc non Af Amer 84 (*)    All other components within normal limits  URINE MICROSCOPIC-ADD ON - Abnormal; Notable for the following:    Squamous Epithelial / LPF FEW (*)    Bacteria, UA MANY (*)    Crystals TRIPLE PHOSPHATE CRYSTALS (*)    All other components within normal limits  URINE CULTURE  LIPASE, BLOOD  POCT PREGNANCY, URINE   Ct Abdomen Pelvis W Contrast  06/08/2012  *RADIOLOGY REPORT*  Clinical Data: Right-sided abdominal pain  extending into the back.  CT ABDOMEN AND PELVIS WITH CONTRAST  Technique:  Multidetector CT imaging of the abdomen and pelvis was performed following the standard protocol during bolus administration of intravenous contrast.  Contrast: 172m OMNIPAQUE IOHEXOL 300 MG/ML  SOLN  Comparison: CT abdomen and pelvis with contrast 06/01/2009.  Findings: The lung bases are clear without focal nodule, mass, or airspace disease.  The heart size is normal.  No significant pleural or pericardial effusion is present.  Fatty infiltration of the liver is suspected.  No discrete lesions are present.  The spleen is unremarkable.  The stomach, duodenum, and pancreas are within normal limits.  The common bile duct and gallbladder are normal.  The adrenal glands are normal bilaterally.  Parapelvic renal cyst bilaterally there is much more prominent than on the prior study.  The collecting system ureters are within normal limits.  The urinary bladder is unremarkable.  Extensive inflammatory changes surround the distal sigmoid colon. A focal contained perforation or developing abscess is seen just anterior to the area of diverticulitis, measuring 16 x 20 x 13 mm. The collection contains gas.  A small amount of free fluid is present.  The remainder of the colon is unremarkable.  The appendix is visualized and within normal limits.  The small bowel is normal. The uterus and adnexa are normal for age.  Mild degenerative changes are noted in the lower lumbar facets. The bone windows are otherwise unremarkable.  IMPRESSION:  1.  Distal sigmoid diverticulitis. 2.  A 2 cm pericolonic abscess is noted anteriorly. 3.  Minimal free fluid is layering. 4.  Bilateral parapelvic kidney cysts are slightly more conspicuous than on the prior exam.  There is no obstruction. 5.  Probable fatty infiltration of the liver.  Critical Value/emergent results were called by telephone at the time of interpretation on 06/09/2038 at 1 o'clock a.m. to Dr. CVenora Maples who  verbally acknowledged these results.   Original Report Authenticated By: CSan Morelle M.D.    I personally reviewed the imaging tests through PACS system I reviewed available ER/hospitalization records through the EMR   1. Diverticulitis   2. Abdominal abscess       MDM  IV zosyn now for complicated diverticulitis. Will admit. Pain control        KHoy Morn MD 06/08/12 [phone]

## 2012-06-08 NOTE — H&P (Addendum)
Patient's PCP: Does not have a primary care physician  Chief Complaint: Abdominal pain  History of Present Illness: Sheri Calderon is a 47 y.o. Caucasian female history of A. fib which is resolved not on any anticoagulation, and restless leg syndrome who presents with the above complaints.  She reports that she has been having generalized abdominal pain over the last month.  However in the last 2-3 days she has noted increased abdominal distention with increasing pain in the right lower quadrant as a result she presented to the ER for further evaluation.  Imaging studies in the ER showed distal sigmoid diverticulitis with 2 cm pericolonic abscess anteriorly.  Hospitalist service was consulted and the patient for further care and management.  Patient denies any fevers but has felt feverish at home.  Denies any chills.  Denies any nausea, vomiting, shortness of breath, chest pain, diarrhea, headaches or vision changes  Review of Systems: All systems reviewed with the patient and positive as per history of present illness, otherwise all other systems are negative.  Past Medical History  Diagnosis Date  . Atrial fibrillation   . Lower extremity edema   . Restless legs syndrome    Past Surgical History  Procedure Laterality Date  . Tubal ligation     Family History  Problem Relation Age of Onset  . Diabetes Mother   . Heart disease Mother   . Stomach cancer Mother   . COPD Father   . Hypertension Father   . Hyperlipidemia Father    History   Social History  . Marital Status: Married    Spouse Name: N/A    Number of Children: N/A  . Years of Education: N/A   Occupational History  . Not on file.   Social History Main Topics  . Smoking status: Current Every Day Smoker  . Smokeless tobacco: Not on file  . Alcohol Use: No  . Drug Use: No  . Sexually Active: Not on file   Other Topics Concern  . Not on file   Social History Narrative  . No narrative on file    Allergies: Review of patient's allergies indicates no known allergies.  Home Meds: Prior to Admission medications   Medication Sig Start Date End Date Taking? Authorizing Provider  naproxen sodium (ANAPROX) 220 MG tablet Take 440 mg by mouth every 3 (three) hours as needed (for stomack, back pain).   Yes Historical Provider, MD    Physical Exam: Blood pressure 137/98, pulse 79, temperature 97.6 F (36.4 C), temperature source Oral, resp. rate 20, last menstrual period 04/13/2012, SpO2 99.00%. General: Awake, Oriented x3, No acute distress. HEENT: EOMI, Moist mucous membranes Neck: Supple CV: S1 and S2 Lungs: Clear to ascultation bilaterally Abdomen: Soft, generalized tenderness in all quadrants but more so in the right lower quadrant, Nondistended, +bowel sounds. Ext: Good pulses. Trace edema. No clubbing or cyanosis noted. Neuro: Cranial Nerves II-XII grossly intact. Has 5/5 motor strength in upper and lower extremities.  Lab results:  Recent Labs  06/07/12 1938  NA 133*  K 4.3  CL 100  CO2 25  GLUCOSE 99  BUN 12  CREATININE 0.82  CALCIUM 9.3    Recent Labs  06/07/12 1938  AST 10  ALT 9  ALKPHOS 63  BILITOT 0.3  PROT 7.3  ALBUMIN 3.5    Recent Labs  06/07/12 1938  LIPASE 21    Recent Labs  06/07/12 1938  WBC 12.9*  NEUTROABS 9.3*  HGB 13.4  HCT 38.9  MCV 84.4  PLT 393   No results found for this basename: CKTOTAL, CKMB, CKMBINDEX, TROPONINI,  in the last 72 hours No components found with this basename: POCBNP,  No results found for this basename: DDIMER,  in the last 72 hours No results found for this basename: HGBA1C,  in the last 72 hours No results found for this basename: CHOL, HDL, LDLCALC, TRIG, CHOLHDL, LDLDIRECT,  in the last 72 hours No results found for this basename: TSH, T4TOTAL, FREET3, T3FREE, THYROIDAB,  in the last 72 hours No results found for this basename: VITAMINB12, FOLATE, FERRITIN, TIBC, IRON, RETICCTPCT,  in the last 72  hours Imaging results:  Ct Abdomen Pelvis W Contrast  06/08/2012  *RADIOLOGY REPORT*  Clinical Data: Right-sided abdominal pain extending into the back.  CT ABDOMEN AND PELVIS WITH CONTRAST  Technique:  Multidetector CT imaging of the abdomen and pelvis was performed following the standard protocol during bolus administration of intravenous contrast.  Contrast: 158m OMNIPAQUE IOHEXOL 300 MG/ML  SOLN  Comparison: CT abdomen and pelvis with contrast 06/01/2009.  Findings: The lung bases are clear without focal nodule, mass, or airspace disease.  The heart size is normal.  No significant pleural or pericardial effusion is present.  Fatty infiltration of the liver is suspected.  No discrete lesions are present.  The spleen is unremarkable.  The stomach, duodenum, and pancreas are within normal limits.  The common bile duct and gallbladder are normal.  The adrenal glands are normal bilaterally.  Parapelvic renal cyst bilaterally there is much more prominent than on the prior study.  The collecting system ureters are within normal limits.  The urinary bladder is unremarkable.  Extensive inflammatory changes surround the distal sigmoid colon. A focal contained perforation or developing abscess is seen just anterior to the area of diverticulitis, measuring 16 x 20 x 13 mm. The collection contains gas.  A small amount of free fluid is present.  The remainder of the colon is unremarkable.  The appendix is visualized and within normal limits.  The small bowel is normal. The uterus and adnexa are normal for age.  Mild degenerative changes are noted in the lower lumbar facets. The bone windows are otherwise unremarkable.  IMPRESSION:  1.  Distal sigmoid diverticulitis. 2.  A 2 cm pericolonic abscess is noted anteriorly. 3.  Minimal free fluid is layering. 4.  Bilateral parapelvic kidney cysts are slightly more conspicuous than on the prior exam.  There is no obstruction. 5.  Probable fatty infiltration of the liver.  Critical  Value/emergent results were called by telephone at the time of interpretation on 06/09/2038 at 1 o'clock a.m. to Dr. CVenora Maples who verbally acknowledged these results.   Original Report Authenticated By: CSan Morelle M.D.    Assessment & Plan by Problem: Abdominal pain due to distal sigmoid diverticulitis with 2 cm peri-colonic abscess Patient initially given Zosyn in the ER.  Discussed with Dr. BBarry Dienes general surgery who will evaluate the patient in the morning.  Recommended Cipro and Flagyl.  Continue n.p.o.  Continue IV hydration.  Patient may need interventional radiology consultation to determine if the abscess could be drained by CT-guided approach, will defer to general surgery.  Control pain with pain medications.  Continue anti-emetics.  Hyponatremia Likely due to pain.  Continue to monitor.  Mild leukocytosis Likely due to diverticulitis.  Prophylaxis Lovenox.  CODE STATUS Full code.  Disposition Admit the patient to MedSurg as inpatient.  Time spent on admission, talking to the patient, and coordinating  care was: 50 mins.  Colletta Spillers A, MD 06/08/2012, 2:10 AM

## 2012-06-08 NOTE — Progress Notes (Signed)
UR COMPLETED  

## 2012-06-08 NOTE — Consult Note (Signed)
Reason for Consult:Abdominal pain Referring Physician: Dr. Lottie Dawson   HPI: This is a surgery consultation for Sheri Calderon who is a 47 y.o. female with a history of A, RLS, tobacco use who presented to [facility] with complaints of abdominal pain.  The states her pain started about 1 month ago, mild in intensity.  It worsened two days ago, became more constant.  Associated with malaise and anorexia.  She denies nausea, vomiting, diarrhea or constipation.  Denies melena or hematochezia.  The pain is constant.  No aggravating or alleviating factors.  She denies prior like episodes.  Family history significant for mother with metastatic ovarian cancer.  Pt. Denies family history of colon cancer.  Denies history of crohns, colitis, celiac disease. She had a CT scan of abdomen and pelvis which noted distal sigmoid diverticulitis with 2cm pericolonic abscess.  She was started on IV cipro and flagyl per Dr. Barry Dienes recommendations.  IV hydration as well as pain control was initiated.  At present time, she is not in any acute distress.  Father and son are at bedside.  She complains of abdominal pain most pronounced in the right lower quadrant and suprapubic region.  She denies n/v.  She is NPO.     Past Medical History  Diagnosis Date  . Atrial fibrillation   . Lower extremity edema   . Restless legs syndrome     Past Surgical History  Procedure Laterality Date  . Tubal ligation      Family History  Problem Relation Age of Onset  . Diabetes Mother   . Heart disease Mother   . Stomach cancer Mother   . Cancer Mother     Ovarian CA  . COPD Father   . Hypertension Father   . Hyperlipidemia Father     Social History:  reports that she has been smoking.  She does not have any smokeless tobacco history on file. She reports that  drinks alcohol. She reports that she does not use illicit drugs.  Allergies: No Known Allergies  Medications: reviewed  Results for orders placed during the hospital  encounter of 06/07/12 (from the past 48 hour(s))  CBC WITH DIFFERENTIAL     Status: Abnormal   Collection Time    06/07/12  7:38 PM      Result Value Range   WBC 12.9 (*) 4.0 - 10.5 K/uL   RBC 4.61  3.87 - 5.11 MIL/uL   Hemoglobin 13.4  12.0 - 15.0 g/dL   HCT 38.9  36.0 - 46.0 %   MCV 84.4  78.0 - 100.0 fL   MCH 29.1  26.0 - 34.0 pg   MCHC 34.4  30.0 - 36.0 g/dL   RDW 12.9  11.5 - 15.5 %   Platelets 393  150 - 400 K/uL   Neutrophils Relative 73  43 - 77 %   Neutro Abs 9.3 (*) 1.7 - 7.7 K/uL   Lymphocytes Relative 18  12 - 46 %   Lymphs Abs 2.3  0.7 - 4.0 K/uL   Monocytes Relative 7  3 - 12 %   Monocytes Absolute 0.9  0.1 - 1.0 K/uL   Eosinophils Relative 2  0 - 5 %   Eosinophils Absolute 0.3  0.0 - 0.7 K/uL   Basophils Relative 0  0 - 1 %   Basophils Absolute 0.0  0.0 - 0.1 K/uL  COMPREHENSIVE METABOLIC PANEL     Status: Abnormal   Collection Time    06/07/12  7:38  PM      Result Value Range   Sodium 133 (*) 135 - 145 mEq/L   Potassium 4.3  3.5 - 5.1 mEq/L   Chloride 100  96 - 112 mEq/L   CO2 25  19 - 32 mEq/L   Glucose, Bld 99  70 - 99 mg/dL   BUN 12  6 - 23 mg/dL   Creatinine, Ser 0.82  0.50 - 1.10 mg/dL   Calcium 9.3  8.4 - 10.5 mg/dL   Total Protein 7.3  6.0 - 8.3 g/dL   Albumin 3.5  3.5 - 5.2 g/dL   AST 10  0 - 37 U/L   ALT 9  0 - 35 U/L   Alkaline Phosphatase 63  39 - 117 U/L   Total Bilirubin 0.3  0.3 - 1.2 mg/dL   GFR calc non Af Amer 84 (*) >90 mL/min   GFR calc Af Amer >90  >90 mL/min   Comment:            The eGFR has been calculated     using the CKD EPI equation.     This calculation has not been     validated in all clinical     situations.     eGFR's persistently     <90 mL/min signify     possible Chronic Kidney Disease.  LIPASE, BLOOD     Status: None   Collection Time    06/07/12  7:38 PM      Result Value Range   Lipase 21  11 - 59 U/L  URINALYSIS, ROUTINE W REFLEX MICROSCOPIC     Status: Abnormal   Collection Time    06/07/12  7:43 PM       Result Value Range   Color, Urine YELLOW  YELLOW   APPearance TURBID (*) CLEAR   Specific Gravity, Urine 1.025  1.005 - 1.030   pH 8.5 (*) 5.0 - 8.0   Glucose, UA NEGATIVE  NEGATIVE mg/dL   Hgb urine dipstick NEGATIVE  NEGATIVE   Bilirubin Urine NEGATIVE  NEGATIVE   Ketones, ur NEGATIVE  NEGATIVE mg/dL   Protein, ur NEGATIVE  NEGATIVE mg/dL   Urobilinogen, UA 1.0  0.0 - 1.0 mg/dL   Nitrite NEGATIVE  NEGATIVE   Leukocytes, UA TRACE (*) NEGATIVE  URINE MICROSCOPIC-ADD ON     Status: Abnormal   Collection Time    06/07/12  7:43 PM      Result Value Range   Squamous Epithelial / LPF FEW (*) RARE   WBC, UA 0-2  <3 WBC/hpf   RBC / HPF 0-2  <3 RBC/hpf   Bacteria, UA MANY (*) RARE   Crystals TRIPLE PHOSPHATE CRYSTALS (*) NEGATIVE   Urine-Other MUCOUS PRESENT     Comment: AMORPHOUS URATES/PHOSPHATES  POCT PREGNANCY, URINE     Status: None   Collection Time    06/07/12  7:45 PM      Result Value Range   Preg Test, Ur NEGATIVE  NEGATIVE   Comment:            THE SENSITIVITY OF THIS     METHODOLOGY IS >24 mIU/mL  BASIC METABOLIC PANEL     Status: Abnormal   Collection Time    06/08/12  6:40 AM      Result Value Range   Sodium 136  135 - 145 mEq/L   Potassium 3.8  3.5 - 5.1 mEq/L   Chloride 105  96 - 112 mEq/L   CO2 24  19 - 32 mEq/L   Glucose, Bld 95  70 - 99 mg/dL   BUN 9  6 - 23 mg/dL   Creatinine, Ser 0.82  0.50 - 1.10 mg/dL   Calcium 8.3 (*) 8.4 - 10.5 mg/dL   GFR calc non Af Amer 84 (*) >90 mL/min   GFR calc Af Amer >90  >90 mL/min   Comment:            The eGFR has been calculated     using the CKD EPI equation.     This calculation has not been     validated in all clinical     situations.     eGFR's persistently     <90 mL/min signify     possible Chronic Kidney Disease.  CBC     Status: Abnormal   Collection Time    06/08/12  6:40 AM      Result Value Range   WBC 10.9 (*) 4.0 - 10.5 K/uL   RBC 4.13  3.87 - 5.11 MIL/uL   Hemoglobin 11.9 (*) 12.0 - 15.0 g/dL    HCT 34.9 (*) 36.0 - 46.0 %   MCV 84.5  78.0 - 100.0 fL   MCH 28.8  26.0 - 34.0 pg   MCHC 34.1  30.0 - 36.0 g/dL   RDW 13.2  11.5 - 15.5 %   Platelets 328  150 - 400 K/uL    Ct Abdomen Pelvis W Contrast  06/08/2012  *RADIOLOGY REPORT*  Clinical Data: Right-sided abdominal pain extending into the back.  CT ABDOMEN AND PELVIS WITH CONTRAST  Technique:  Multidetector CT imaging of the abdomen and pelvis was performed following the standard protocol during bolus administration of intravenous contrast.  Contrast: 168m OMNIPAQUE IOHEXOL 300 MG/ML  SOLN  Comparison: CT abdomen and pelvis with contrast 06/01/2009.  Findings: The lung bases are clear without focal nodule, mass, or airspace disease.  The heart size is normal.  No significant pleural or pericardial effusion is present.  Fatty infiltration of the liver is suspected.  No discrete lesions are present.  The spleen is unremarkable.  The stomach, duodenum, and pancreas are within normal limits.  The common bile duct and gallbladder are normal.  The adrenal glands are normal bilaterally.  Parapelvic renal cyst bilaterally there is much more prominent than on the prior study.  The collecting system ureters are within normal limits.  The urinary bladder is unremarkable.  Extensive inflammatory changes surround the distal sigmoid colon. A focal contained perforation or developing abscess is seen just anterior to the area of diverticulitis, measuring 16 x 20 x 13 mm. The collection contains gas.  A small amount of free fluid is present.  The remainder of the colon is unremarkable.  The appendix is visualized and within normal limits.  The small bowel is normal. The uterus and adnexa are normal for age.  Mild degenerative changes are noted in the lower lumbar facets. The bone windows are otherwise unremarkable.  IMPRESSION:  1.  Distal sigmoid diverticulitis. 2.  A 2 cm pericolonic abscess is noted anteriorly. 3.  Minimal free fluid is layering. 4.  Bilateral  parapelvic kidney cysts are slightly more conspicuous than on the prior exam.  There is no obstruction. 5.  Probable fatty infiltration of the liver.  Critical Value/emergent results were called by telephone at the time of interpretation on 06/09/2038 at 1 o'clock a.m. to Dr. CVenora Maples who verbally acknowledged these results.   Original Report Authenticated By: CSan Morelle  M.D.     Review of Systems  Constitutional: Negative for fever, chills, weight loss and diaphoresis.  Cardiovascular: Negative for chest pain and palpitations.  Gastrointestinal: Positive for abdominal pain. Negative for nausea, vomiting, diarrhea, constipation, blood in stool and melena.  Genitourinary: Negative for dysuria.   Blood pressure 133/83, pulse 75, temperature 98.6 F (37 C), temperature source Oral, resp. rate 19, height 5' 7"  (1.702 m), weight 184 lb (83.462 kg), last menstrual period 04/13/2012, SpO2 98.00%. Physical Exam  Constitutional: She is oriented to person, place, and time. She appears well-developed and well-nourished. No distress.  HENT:  Head: Normocephalic and atraumatic.  Eyes: Conjunctivae and EOM are normal.  Cardiovascular: Normal rate and normal heart sounds.  Exam reveals no gallop.   No murmur heard. Respiratory: No respiratory distress. She has no wheezes. She has no rales.  GI: Bowel sounds are normal. She exhibits no distension and no mass. There is tenderness.  Suprapubic, RLQ and LLQ tenderness  Neurological: She is alert and oriented to person, place, and time.  Skin: Skin is warm. No erythema.  Psychiatric: She has a normal mood and affect.    Assessment/Plan: Acute Complicated Diverticulitis with 2cm pericolonic abscess -this is likely a phlegmonous process and too small to drain at this time.  Will hold off on IR. -Conservative treatment -continue with flagyl and cipro.  If no improvement will change to zosyn. -NPO if markedly improved, start clear liquids  tomorrow. -Continue with IV hydration and pain control. -repeat CBC in AM -discussed pathophysiology, clinical manifestations, coarse of treatment of diverticulosis.  She verbalizes understanding.    Neill Jurewicz ANP-BC 06/08/2012, 10:55 AM

## 2012-06-09 DIAGNOSIS — K651 Peritoneal abscess: Secondary | ICD-10-CM

## 2012-06-09 LAB — URINE CULTURE

## 2012-06-09 LAB — CBC
MCH: 28.4 pg (ref 26.0–34.0)
MCHC: 33.6 g/dL (ref 30.0–36.0)
Platelets: 318 10*3/uL (ref 150–400)
RDW: 13 % (ref 11.5–15.5)

## 2012-06-09 MED ORDER — HYDROCODONE-ACETAMINOPHEN 5-325 MG PO TABS
1.0000 | ORAL_TABLET | ORAL | Status: DC | PRN
  Administered 2012-06-09 (×4): 1 via ORAL
  Filled 2012-06-09 (×5): qty 1

## 2012-06-09 MED ORDER — CIPROFLOXACIN HCL 500 MG PO TABS
500.0000 mg | ORAL_TABLET | Freq: Two times a day (BID) | ORAL | Status: DC
  Administered 2012-06-09 – 2012-06-10 (×2): 500 mg via ORAL
  Filled 2012-06-09 (×4): qty 1

## 2012-06-09 MED ORDER — METRONIDAZOLE 500 MG PO TABS
500.0000 mg | ORAL_TABLET | Freq: Three times a day (TID) | ORAL | Status: DC
  Administered 2012-06-09 – 2012-06-10 (×4): 500 mg via ORAL
  Filled 2012-06-09 (×6): qty 1

## 2012-06-09 NOTE — Progress Notes (Signed)
Agree with above, wbc normal today

## 2012-06-09 NOTE — Progress Notes (Signed)
[facility] PROGRESS NOTE  Sheri Calderon EOF:[id number] DOB: 1965-06-09 DOA: 06/07/2012 PCP: Default, Provider, MD  Brief Narrative:  47 year old female presenting with abdominal pain for last few weeks which was worse on the day of admission with COPD and over bilateral lower quadrant.  CT of the abdomen done in the ED showed a distal sigmoid diverticulitis with 2 cm pericolonic abscess.   Assessment/Plan:  Sigmoid diverticulitis with pericolonic abscess  Patient kept n.p.o on admission and hydrated. Started empirically on ciprofloxacin and Flagyl  Appreciate surgical consult. Recommend the abscess to be quite small be drained. Recommend to continue current antibiotics and colonoscopy in 4-6 weeks as outpatient..  Pain markedly improved today. Will start clears.   -Continue pain control. ( has not taken any IV meds since yday), will order prn vicodin  Remote history of A. fib  Not on any medications. In sinus rhythm   DVT prophylaxis  Subcutaneous Lovenox   Code Status: Full code   Family Communication: husband at Bedside  Disposition Plan: Home if tolerating diet. Likely tomorrow  Consultants:  General surgery  Procedures:  none  Antibiotics: IV cipro and flagyl ( day 2)  HPI/Subjective: Pain markedly improved, afebrile  Objective: Filed Vitals:   06/08/12 1300 06/08/12 2113 06/09/12 0500 06/09/12 0627  BP: 89/49 118/66  122/71  Pulse: 71 66  71  Temp: 98.2 F (36.8 C) 98.4 F (36.9 C)  98.7 F (37.1 C)  TempSrc:  Oral  Oral  Resp: 18 18  18   Height:      Weight:   83.468 kg (184 lb 0.2 oz)   SpO2: 97% 98%  99%    Intake/Output Summary (Last 24 hours) at 06/09/12 0827 Last data filed at 06/09/12 0645  Gross per 24 hour  Intake   1860 ml  Output      0 ml  Net   1860 ml   Filed Weights   06/08/12 0245 06/09/12 0500  Weight: 83.462 kg (184 lb) 83.468 kg (184 lb 0.2 oz)    Exam:  General: Middle-aged female lying in bed in no acute distress   HEENT: No pallor, moist oral mucosa  CVS: Normal S1 and S2, no murmurs rubs or gallop  Chest: Clear to auscultation bilaterally , no added sounds  Abdomen: Soft, nondistended,  bowel sounds present , minimal LLQ tenderness Extremities: Warm, no edema  CNS: AAO x3   Data Reviewed: Basic Metabolic Panel:  Recent Labs Lab 06/07/12 1938 06/08/12 0640  NA 133* 136  K 4.3 3.8  CL 100 105  CO2 25 24  GLUCOSE 99 95  BUN 12 9  CREATININE 0.82 0.82  CALCIUM 9.3 8.3*   Liver Function Tests:  Recent Labs Lab 06/07/12 1938  AST 10  ALT 9  ALKPHOS 63  BILITOT 0.3  PROT 7.3  ALBUMIN 3.5    Recent Labs Lab 06/07/12 1938  LIPASE 21   No results found for this basename: AMMONIA,  in the last 168 hours CBC:  Recent Labs Lab 06/07/12 1938 06/08/12 0640 06/09/12 0640  WBC 12.9* 10.9* 7.8  NEUTROABS 9.3*  --   --   HGB 13.4 11.9* 11.4*  HCT 38.9 34.9* 33.9*  MCV 84.4 84.5 84.3  PLT 393 328 318   Cardiac Enzymes: No results found for this basename: CKTOTAL, CKMB, CKMBINDEX, TROPONINI,  in the last 168 hours BNP (last 3 results) No results found for this basename: PROBNP,  in the last 8760 hours CBG: No results found for  this basename: GLUCAP,  in the last 168 hours  Recent Results (from the past 240 hour(s))  URINE CULTURE     Status: None   Collection Time    06/08/12  1:00 AM      Result Value Range Status   Specimen Description URINE, CLEAN CATCH   Final   Special Requests NONE   Final   Culture  Setup Time 06/08/2012 01:28   Final   Colony Count 40,000 COLONIES/ML   Final   Culture     Final   Value: Multiple bacterial morphotypes present, none predominant. Suggest appropriate recollection if clinically indicated.   Report Status 06/09/2012 FINAL   Final     Studies: Ct Abdomen Pelvis W Contrast  06/08/2012  *RADIOLOGY REPORT*  Clinical Data: Right-sided abdominal pain extending into the back.  CT ABDOMEN AND PELVIS WITH CONTRAST  Technique:   Multidetector CT imaging of the abdomen and pelvis was performed following the standard protocol during bolus administration of intravenous contrast.  Contrast: 177m OMNIPAQUE IOHEXOL 300 MG/ML  SOLN  Comparison: CT abdomen and pelvis with contrast 06/01/2009.  Findings: The lung bases are clear without focal nodule, mass, or airspace disease.  The heart size is normal.  No significant pleural or pericardial effusion is present.  Fatty infiltration of the liver is suspected.  No discrete lesions are present.  The spleen is unremarkable.  The stomach, duodenum, and pancreas are within normal limits.  The common bile duct and gallbladder are normal.  The adrenal glands are normal bilaterally.  Parapelvic renal cyst bilaterally there is much more prominent than on the prior study.  The collecting system ureters are within normal limits.  The urinary bladder is unremarkable.  Extensive inflammatory changes surround the distal sigmoid colon. A focal contained perforation or developing abscess is seen just anterior to the area of diverticulitis, measuring 16 x 20 x 13 mm. The collection contains gas.  A small amount of free fluid is present.  The remainder of the colon is unremarkable.  The appendix is visualized and within normal limits.  The small bowel is normal. The uterus and adnexa are normal for age.  Mild degenerative changes are noted in the lower lumbar facets. The bone windows are otherwise unremarkable.  IMPRESSION:  1.  Distal sigmoid diverticulitis. 2.  A 2 cm pericolonic abscess is noted anteriorly. 3.  Minimal free fluid is layering. 4.  Bilateral parapelvic kidney cysts are slightly more conspicuous than on the prior exam.  There is no obstruction. 5.  Probable fatty infiltration of the liver.  Critical Value/emergent results were called by telephone at the time of interpretation on 06/09/2038 at 1 o'clock a.m. to Dr. CVenora Maples who verbally acknowledged these results.   Original Report Authenticated By:  CSan Morelle M.D.     Scheduled Meds: . ciprofloxacin  400 mg Intravenous Q12H  . enoxaparin (LOVENOX) injection  40 mg Subcutaneous Q24H  . metronidazole  500 mg Intravenous Q8H   Continuous Infusions: . sodium chloride 1,000 mL (06/08/12 1353)      Time spent: 25 minutes    Shanautica Forker  [facility] Pager [phone] If 7PM-7AM, please contact night-coverage at [email or web address], password [email or web address] At Quarterfield Station4/11/2012, 8:27 AM  LOS: 2 days

## 2012-06-09 NOTE — Progress Notes (Signed)
Patient ID: Sheri Calderon, female   DOB: 11/07/1965, 47 y.o.   MRN: [id number]    Subjective: Pt feels better today, but still having some pain.  Doesn't need strong meds anymore she says.  Ate some jello for breakfast.  Objective: Vital signs in last 24 hours: Temp:  [98.2 F (36.8 C)-98.7 F (37.1 C)] 98.7 F (37.1 C) (04/10 0627) Pulse Rate:  [66-71] 71 (04/10 0627) Resp:  [18] 18 (04/10 0627) BP: (89-122)/(49-71) 122/71 mmHg (04/10 0627) SpO2:  [97 %-99 %] 99 % (04/10 0627) Weight:  [184 lb 0.2 oz (83.468 kg)] 184 lb 0.2 oz (83.468 kg) (04/10 0500) Last BM Date: 06/07/12  Intake/Output from previous day: 04/09 0701 - 04/10 0700 In: 1860 [P.O.:60; I.V.:1800] Out: -  Intake/Output this shift:    PE: Abd: soft, decrease tenderness, +BS, ND Heart: regular Lungs: CTAB  Lab Results:   Recent Labs  06/08/12 0640 06/09/12 0640  WBC 10.9* 7.8  HGB 11.9* 11.4*  HCT 34.9* 33.9*  PLT 328 318   BMET  Recent Labs  06/07/12 1938 06/08/12 0640  NA 133* 136  K 4.3 3.8  CL 100 105  CO2 25 24  GLUCOSE 99 95  BUN 12 9  CREATININE 0.82 0.82  CALCIUM 9.3 8.3*   PT/INR No results found for this basename: LABPROT, INR,  in the last 72 hours CMP     Component Value Date/Time   NA 136 06/08/2012 0640   K 3.8 06/08/2012 0640   CL 105 06/08/2012 0640   CO2 24 06/08/2012 0640   GLUCOSE 95 06/08/2012 0640   BUN 9 06/08/2012 0640   CREATININE 0.82 06/08/2012 0640   CALCIUM 8.3* 06/08/2012 0640   PROT 7.3 06/07/2012 1938   ALBUMIN 3.5 06/07/2012 1938   AST 10 06/07/2012 1938   ALT 9 06/07/2012 1938   ALKPHOS 63 06/07/2012 1938   BILITOT 0.3 06/07/2012 1938   GFRNONAA 84* 06/08/2012 0640   GFRAA >90 06/08/2012 0640   Lipase     Component Value Date/Time   LIPASE 21 06/07/2012 1938       Studies/Results: Ct Abdomen Pelvis W Contrast  06/08/2012  *RADIOLOGY REPORT*  Clinical Data: Right-sided abdominal pain extending into the back.  CT ABDOMEN AND PELVIS WITH CONTRAST  Technique:  Multidetector  CT imaging of the abdomen and pelvis was performed following the standard protocol during bolus administration of intravenous contrast.  Contrast: 138m OMNIPAQUE IOHEXOL 300 MG/ML  SOLN  Comparison: CT abdomen and pelvis with contrast 06/01/2009.  Findings: The lung bases are clear without focal nodule, mass, or airspace disease.  The heart size is normal.  No significant pleural or pericardial effusion is present.  Fatty infiltration of the liver is suspected.  No discrete lesions are present.  The spleen is unremarkable.  The stomach, duodenum, and pancreas are within normal limits.  The common bile duct and gallbladder are normal.  The adrenal glands are normal bilaterally.  Parapelvic renal cyst bilaterally there is much more prominent than on the prior study.  The collecting system ureters are within normal limits.  The urinary bladder is unremarkable.  Extensive inflammatory changes surround the distal sigmoid colon. A focal contained perforation or developing abscess is seen just anterior to the area of diverticulitis, measuring 16 x 20 x 13 mm. The collection contains gas.  A small amount of free fluid is present.  The remainder of the colon is unremarkable.  The appendix is visualized and within normal limits.  The  small bowel is normal. The uterus and adnexa are normal for age.  Mild degenerative changes are noted in the lower lumbar facets. The bone windows are otherwise unremarkable.  IMPRESSION:  1.  Distal sigmoid diverticulitis. 2.  A 2 cm pericolonic abscess is noted anteriorly. 3.  Minimal free fluid is layering. 4.  Bilateral parapelvic kidney cysts are slightly more conspicuous than on the prior exam.  There is no obstruction. 5.  Probable fatty infiltration of the liver.  Critical Value/emergent results were called by telephone at the time of interpretation on 06/09/2038 at 1 o'clock a.m. to Dr. Venora Maples, who verbally acknowledged these results.   Original Report Authenticated By: San Morelle, M.D.     Anti-infectives: Anti-infectives   Start     Dose/Rate Route Frequency Ordered Stop   06/08/12 0215  ciprofloxacin (CIPRO) IVPB 400 mg     400 mg 200 mL/hr over 60 Minutes Intravenous Every 12 hours 06/08/12 0206     06/08/12 0215  metroNIDAZOLE (FLAGYL) IVPB 500 mg     500 mg 100 mL/hr over 60 Minutes Intravenous Every 8 hours 06/08/12 0206     06/08/12 0115  piperacillin-tazobactam (ZOSYN) IVPB 3.375 g     3.375 g 12.5 mL/hr over 240 Minutes Intravenous  Once 06/08/12 0103 06/08/12 0203       Assessment/Plan  1. Diverticulitis with microperf and phlegmon  Plan: 1. Would recommend not advancing past clear liquids today given complicated case of diverticulitis with microperforation.  May advance tomorrow if tolerates this well today.  If conts to do well, could likely change to po abx therapy tomorrow as well. 2. Will cont to follow.   LOS: 2 days    Lavon Horn E 06/09/2012, 9:41 AM Pager: [phone]

## 2012-06-10 DIAGNOSIS — D72829 Elevated white blood cell count, unspecified: Secondary | ICD-10-CM

## 2012-06-10 DIAGNOSIS — E871 Hypo-osmolality and hyponatremia: Secondary | ICD-10-CM

## 2012-06-10 MED ORDER — METRONIDAZOLE 500 MG PO TABS: 500.0000 mg | ORAL_TABLET | Freq: Three times a day (TID) | ORAL | Status: DC

## 2012-06-10 MED ORDER — HYDROCODONE-ACETAMINOPHEN 5-325 MG PO TABS: 1.0000 | ORAL_TABLET | Freq: Four times a day (QID) | ORAL | Status: DC | PRN

## 2012-06-10 MED ORDER — CIPROFLOXACIN HCL 500 MG PO TABS: 500.0000 mg | ORAL_TABLET | Freq: Two times a day (BID) | ORAL | Status: DC

## 2012-06-10 NOTE — Progress Notes (Signed)
Agree with above, will need gi referral for scope

## 2012-06-10 NOTE — Discharge Summary (Signed)
Physician Discharge Summary  CARMAN AUXIER KJZ:[id number] DOB: [birth year] DOA: 06/07/2012  PCP: Default, Provider, MD  Admit date: 06/07/2012 Discharge date: 06/10/2012  Time spent: 40 minutes  Recommendations for Outpatient Follow-up:  Home with outpt surgery follow up in 2 weeks  Discharge Diagnoses:   Principal Problem:  sigmoid  Diverticulitis with abscess  Active Problems:   Leukocytosis   Abdominal pain   Hyponatremia   Discharge Condition: fair  Diet recommendation:low fiber  Filed Weights   06/08/12 0245 06/09/12 0500  Weight: 83.462 kg (184 lb) 83.468 kg (184 lb 0.2 oz)    History of present illness:  Please refer to admission H&P for details, but in brief, 47 year old female presenting with abdominal pain for last few weeks which was worse on the day of admission with pain over bilateral lower quadrant.  CT of the abdomen done in the ED showed a distal sigmoid diverticulitis with 2 cm pericolonic abscess.    Hospital Course:  Sigmoid diverticulitis with pericolonic abscess  Patient kept n.p.o on admission and hydrated.  Started empirically on ciprofloxacin and Flagyl  Appreciate surgical consult. Recommend the abscess to be quite small be drained. Recommend to continue current antibiotics and colonoscopy in 4-6 weeks as outpatient..  Pain markedly improved and tolerating advanced diet to low fiber. Will discharge her home on total 2 weeks course of cipro and flagyl and  follow up with [place] surgery.  leucocytosis and  hyponatremia on presentation have resolved.   Remote history of A. fib  Not on any medications. In sinus rhythm      Procedures:  none  Consultations: [facility] [facility] ( Dr Donne Hazel)  Discharge Exam: Filed Vitals:   06/09/12 0627 06/09/12 1300 06/09/12 2101 06/10/12 0600  BP: 122/71 114/65 155/87 150/93  Pulse: 71 69 69 54  Temp: 98.7 F (37.1 C) 98.8 F (37.1 C) 98.1 F (36.7 C) 97.3 F (36.3 C)  TempSrc: Oral     Resp: 18  18 18 18   Height:      Weight:      SpO2: 99% 98% 100% 97%    General: Middle-aged female lying in bed in no acute distress  HEENT: No pallor, moist oral mucosa  CVS: Normal S1 and S2, no murmurs rubs or gallop  Chest: Clear to auscultation bilaterally , no added sounds  Abdomen: Soft, nondistended, bowel sounds present ,  LLQ tenderness resolved  Extremities: Warm, no edema  CNS: AAO x3   Discharge Instructions     Medication List    TAKE these medications       ciprofloxacin 500 MG tablet  Commonly known as:  CIPRO  Take 1 tablet (500 mg total) by mouth 2 (two) times daily. Until 4/22     HYDROcodone-acetaminophen 5-325 MG per tablet  Commonly known as:  NORCO/VICODIN  Take 1 tablet by mouth every 6 (six) hours as needed for pain.     metroNIDAZOLE 500 MG tablet  Commonly known as:  FLAGYL  Take 1 tablet (500 mg total) by mouth every 8 (eight) hours.  Until 4/22     naproxen sodium 220 MG tablet  Commonly known as:  ANAPROX  Take 440 mg by mouth every 3 (three) hours as needed (for stomack, back pain).           Follow-up Information   Follow up with Default, Provider, MD.   Contact information:   [place] [address] [phone]       Follow up with  [facility]. Schedule an appointment as soon as possible for a visit in 2 weeks.   Contact information:   [place] [address] [phone]       The results of significant diagnostics from this hospitalization (including imaging, microbiology, ancillary and laboratory) are listed below for reference.    Significant Diagnostic Studies: Ct Abdomen Pelvis W Contrast  06/08/2012  *RADIOLOGY REPORT*  Clinical Data: Right-sided abdominal pain extending into the back.  CT ABDOMEN AND PELVIS WITH CONTRAST  Technique:  Multidetector CT imaging of the abdomen and pelvis was performed following the standard protocol during bolus administration of  intravenous contrast.  Contrast: 126m OMNIPAQUE IOHEXOL 300 MG/ML  SOLN  Comparison: CT abdomen and pelvis with contrast 06/01/2009.  Findings: The lung bases are clear without focal nodule, mass, or airspace disease.  The heart size is normal.  No significant pleural or pericardial effusion is present.  Fatty infiltration of the liver is suspected.  No discrete lesions are present.  The spleen is unremarkable.  The stomach, duodenum, and pancreas are within normal limits.  The common bile duct and gallbladder are normal.  The adrenal glands are normal bilaterally.  Parapelvic renal cyst bilaterally there is much more prominent than on the prior study.  The collecting system ureters are within normal limits.  The urinary bladder is unremarkable.  Extensive inflammatory changes surround the distal sigmoid colon. A focal contained perforation or developing abscess is seen just anterior to the area of diverticulitis, measuring 16 x 20 x 13 mm. The collection contains gas.  A small amount of free fluid is present.  The remainder of the colon is unremarkable.  The appendix is visualized and within normal limits.  The small bowel is normal. The uterus and adnexa are normal for age.  Mild degenerative changes are noted in the lower lumbar facets. The bone windows are otherwise unremarkable.  IMPRESSION:  1.  Distal sigmoid diverticulitis. 2.  A 2 cm pericolonic abscess is noted anteriorly. 3.  Minimal free fluid is layering. 4.  Bilateral parapelvic kidney cysts are slightly more conspicuous than on the prior exam.  There is no obstruction. 5.  Probable fatty infiltration of the liver.  Critical Value/emergent results were called by telephone at the time of interpretation on 06/09/2038 at 1 o'clock a.m. to Dr. CVenora Maples who verbally acknowledged these results.   Original Report Authenticated By: CSan Morelle M.D.     Microbiology: Recent Results (from the past 240 hour(s))  URINE CULTURE     Status: None    Collection Time    06/08/12  1:00 AM      Result Value Range Status   Specimen Description URINE, CLEAN CATCH   Final   Special Requests NONE   Final   Culture  Setup Time 06/08/2012 01:28   Final   Colony Count 40,000 COLONIES/ML   Final   Culture     Final   Value: Multiple bacterial morphotypes present, none predominant. Suggest appropriate recollection if clinically indicated.   Report Status 06/09/2012 FINAL   Final     Labs: Basic Metabolic Panel:  Recent Labs Lab 06/07/12 1938 06/08/12 0640  NA 133* 136  K 4.3 3.8  CL 100 105  CO2 25 24  GLUCOSE 99 95  BUN 12 9  CREATININE 0.82 0.82  CALCIUM 9.3 8.3*   Liver Function Tests:  Recent Labs Lab 06/07/12 1938  AST 10  ALT 9  ALKPHOS 63  BILITOT  0.3  PROT 7.3  ALBUMIN 3.5    Recent Labs Lab 06/07/12 1938  LIPASE 21   No results found for this basename: AMMONIA,  in the last 168 hours CBC:  Recent Labs Lab 06/07/12 1938 06/08/12 0640 06/09/12 0640  WBC 12.9* 10.9* 7.8  NEUTROABS 9.3*  --   --   HGB 13.4 11.9* 11.4*  HCT 38.9 34.9* 33.9*  MCV 84.4 84.5 84.3  PLT 393 328 318   Cardiac Enzymes: No results found for this basename: CKTOTAL, CKMB, CKMBINDEX, TROPONINI,  in the last 168 hours BNP: BNP (last 3 results) No results found for this basename: PROBNP,  in the last 8760 hours CBG: No results found for this basename: GLUCAP,  in the last 168 hours     Signed:  Zofia Peckinpaugh, [place]  [facility] 06/10/2012, 9:53 AM

## 2012-06-10 NOTE — Progress Notes (Signed)
NCM attempted call to pt to provide info on how to obtain a PCP by calling the toll free number on her [employer] card to request a list of providers. She can also visit [employer] online for a list of providers. Left message on voice mail for pt to return call. Jonnie Finner RN CCM Case Mgmt phone [phone]

## 2012-06-10 NOTE — Progress Notes (Signed)
D/C instructions and scripts given. I gave pt a list of low fiber foods to review and a note to excuse her from work. pts husband at bedside to take pt home.

## 2012-06-10 NOTE — Progress Notes (Signed)
Patient ID: Sheri Calderon, female   DOB: [birth year], 47 y.o.   MRN: [id number]    Subjective: Pt feels great.  Hungry.  No pain.  Objective: Vital signs in last 24 hours: Temp:  [97.3 F (36.3 C)-98.8 F (37.1 C)] 97.3 F (36.3 C) (04/11 0600) Pulse Rate:  [54-69] 54 (04/11 0600) Resp:  [18] 18 (04/11 0600) BP: (114-155)/(65-93) 150/93 mmHg (04/11 0600) SpO2:  [97 %-100 %] 97 % (04/11 0600) Last BM Date: 06/07/12  Intake/Output from previous day:   Intake/Output this shift:    PE: Abd: soft, NT, ND, +BS  Lab Results:   Recent Labs  06/08/12 0640 06/09/12 0640  WBC 10.9* 7.8  HGB 11.9* 11.4*  HCT 34.9* 33.9*  PLT 328 318   BMET  Recent Labs  06/07/12 1938 06/08/12 0640  NA 133* 136  K 4.3 3.8  CL 100 105  CO2 25 24  GLUCOSE 99 95  BUN 12 9  CREATININE 0.82 0.82  CALCIUM 9.3 8.3*   PT/INR No results found for this basename: LABPROT, INR,  in the last 72 hours CMP     Component Value Date/Time   NA 136 06/08/2012 0640   K 3.8 06/08/2012 0640   CL 105 06/08/2012 0640   CO2 24 06/08/2012 0640   GLUCOSE 95 06/08/2012 0640   BUN 9 06/08/2012 0640   CREATININE 0.82 06/08/2012 0640   CALCIUM 8.3* 06/08/2012 0640   PROT 7.3 06/07/2012 1938   ALBUMIN 3.5 06/07/2012 1938   AST 10 06/07/2012 1938   ALT 9 06/07/2012 1938   ALKPHOS 63 06/07/2012 1938   BILITOT 0.3 06/07/2012 1938   GFRNONAA 84* 06/08/2012 0640   GFRAA >90 06/08/2012 0640   Lipase     Component Value Date/Time   LIPASE 21 06/07/2012 1938       Studies/Results: No results found.  Anti-infectives: Anti-infectives   Start     Dose/Rate Route Frequency Ordered Stop   06/10/12 0000  ciprofloxacin (CIPRO) 500 MG tablet     500 mg Oral 2 times daily 06/10/12 0952     06/10/12 0000  metroNIDAZOLE (FLAGYL) 500 MG tablet  Status:  Discontinued     500 mg Oral Every 8 hours 06/10/12 0952 06/10/12    06/10/12 0000  metroNIDAZOLE (FLAGYL) 500 MG tablet     500 mg Oral Every 8 hours 06/10/12 0955     06/09/12 2000   ciprofloxacin (CIPRO) tablet 500 mg     500 mg Oral 2 times daily 06/09/12 1634     06/09/12 1715  metroNIDAZOLE (FLAGYL) tablet 500 mg     500 mg Oral 3 times per day 06/09/12 1634     06/08/12 0215  ciprofloxacin (CIPRO) IVPB 400 mg  Status:  Discontinued     400 mg 200 mL/hr over 60 Minutes Intravenous Every 12 hours 06/08/12 0206 06/09/12 1634   06/08/12 0215  metroNIDAZOLE (FLAGYL) IVPB 500 mg  Status:  Discontinued     500 mg 100 mL/hr over 60 Minutes Intravenous Every 8 hours 06/08/12 0206 06/09/12 1634   06/08/12 0115  piperacillin-tazobactam (ZOSYN) IVPB 3.375 g     3.375 g 12.5 mL/hr over 240 Minutes Intravenous  Once 06/08/12 0103 06/08/12 0203       Assessment/Plan  1. Diverticulitis with small phlegmon, improved  Plan: 1. Low fiber diet.  [place] [facility] [employer] home later today if tolerates well. 2. 14 days total of abx therapy 3. Follow up with [place] in 3 weeks  in the office as patient has no PCP at this time.   LOS: 3 days    Ezri Landers E 06/10/2012, 10:25 AM Pager: [phone]

## 2012-06-20 ENCOUNTER — Telehealth (INDEPENDENT_AMBULATORY_CARE_PROVIDER_SITE_OTHER): Payer: Self-pay

## 2012-06-20 ENCOUNTER — Encounter (INDEPENDENT_AMBULATORY_CARE_PROVIDER_SITE_OTHER): Payer: Self-pay

## 2012-06-20 ENCOUNTER — Other Ambulatory Visit (INDEPENDENT_AMBULATORY_CARE_PROVIDER_SITE_OTHER): Payer: Self-pay

## 2012-06-20 DIAGNOSIS — K5732 Diverticulitis of large intestine without perforation or abscess without bleeding: Secondary | ICD-10-CM

## 2012-06-20 LAB — CBC WITH DIFFERENTIAL/PLATELET
Basophils Absolute: 0.1 10*3/uL (ref 0.0–0.1)
Eosinophils Relative: 2 % (ref 0–5)
Lymphocytes Relative: 17 % (ref 12–46)
MCV: 82.4 fL (ref 78.0–100.0)
Neutro Abs: 7.8 10*3/uL — ABNORMAL HIGH (ref 1.7–7.7)
Platelets: 431 10*3/uL — ABNORMAL HIGH (ref 150–400)
RDW: 13.3 % (ref 11.5–15.5)
WBC: 10.6 10*3/uL — ABNORMAL HIGH (ref 4.0–10.5)

## 2012-06-20 NOTE — Telephone Encounter (Signed)
The pt called in s/p discharge from the hospital with diverticulitis.  She is on Cipro and Flagyl.  She has cramps in her abdomen and her back hurts.  She gets the cramps worse after she eats.  They come and go.  She avoids eating so she won't hurt.  She has no fever.  She is not nauseated and isn't vomiting.  She has diarrhea.  She is drinking plenty of liquids.  She is worried that the antibiotics aren't strong enough.  I told her the cramps and diarrhea could be from the antibiotics.  She also wonders if she has a kidney infection.  It does not burn when she urinates but she feels pressure after she goes.  She gets cramping after her bowel movements.  She has an appointment to follow up on May 5th. I paged Dr Donne Hazel.  He advised we can get a cbc and ua. I will arrange and notify the pt.

## 2012-06-21 ENCOUNTER — Other Ambulatory Visit (INDEPENDENT_AMBULATORY_CARE_PROVIDER_SITE_OTHER): Payer: Self-pay | Admitting: *Deleted

## 2012-06-21 ENCOUNTER — Inpatient Hospital Stay (HOSPITAL_COMMUNITY)
Admission: AD | Admit: 2012-06-21 | Discharge: 2012-07-04 | DRG: 148 | Disposition: A | Discharge status: Home / Self Care | Payer: BC Managed Care – PPO | Source: Ambulatory Visit | Attending: Surgery | Admitting: Surgery

## 2012-06-21 ENCOUNTER — Other Ambulatory Visit (INDEPENDENT_AMBULATORY_CARE_PROVIDER_SITE_OTHER): Payer: Self-pay | Admitting: General Surgery

## 2012-06-21 ENCOUNTER — Telehealth (INDEPENDENT_AMBULATORY_CARE_PROVIDER_SITE_OTHER): Payer: Self-pay | Admitting: General Surgery

## 2012-06-21 ENCOUNTER — Encounter (HOSPITAL_COMMUNITY): Payer: Self-pay

## 2012-06-21 ENCOUNTER — Ambulatory Visit
Admission: RE | Admit: 2012-06-21 | Discharge: 2012-06-21 | Disposition: A | Discharge status: Home / Self Care | Payer: BC Managed Care – PPO | Source: Ambulatory Visit | Attending: General Surgery | Admitting: General Surgery

## 2012-06-21 DIAGNOSIS — Z87891 Personal history of nicotine dependence: Secondary | ICD-10-CM

## 2012-06-21 DIAGNOSIS — I4891 Unspecified atrial fibrillation: Secondary | ICD-10-CM | POA: Diagnosis present

## 2012-06-21 DIAGNOSIS — D72829 Elevated white blood cell count, unspecified: Secondary | ICD-10-CM | POA: Diagnosis present

## 2012-06-21 DIAGNOSIS — N3289 Other specified disorders of bladder: Secondary | ICD-10-CM | POA: Diagnosis not present

## 2012-06-21 DIAGNOSIS — E876 Hypokalemia: Secondary | ICD-10-CM | POA: Diagnosis present

## 2012-06-21 DIAGNOSIS — R109 Unspecified abdominal pain: Secondary | ICD-10-CM | POA: Diagnosis present

## 2012-06-21 DIAGNOSIS — C772 Secondary and unspecified malignant neoplasm of intra-abdominal lymph nodes: Secondary | ICD-10-CM | POA: Diagnosis present

## 2012-06-21 DIAGNOSIS — C187 Malignant neoplasm of sigmoid colon: Secondary | ICD-10-CM | POA: Diagnosis present

## 2012-06-21 DIAGNOSIS — E871 Hypo-osmolality and hyponatremia: Secondary | ICD-10-CM | POA: Diagnosis not present

## 2012-06-21 DIAGNOSIS — K5732 Diverticulitis of large intestine without perforation or abscess without bleeding: Secondary | ICD-10-CM

## 2012-06-21 DIAGNOSIS — K5792 Diverticulitis of intestine, part unspecified, without perforation or abscess without bleeding: Secondary | ICD-10-CM | POA: Diagnosis present

## 2012-06-21 DIAGNOSIS — K630 Abscess of intestine: Secondary | ICD-10-CM | POA: Diagnosis present

## 2012-06-21 DIAGNOSIS — D62 Acute posthemorrhagic anemia: Secondary | ICD-10-CM | POA: Diagnosis not present

## 2012-06-21 HISTORY — DX: Diverticulitis of intestine, part unspecified, without perforation or abscess without bleeding: K57.92

## 2012-06-21 LAB — CBC
HCT: 35.2 % — ABNORMAL LOW (ref 36.0–46.0)
Hemoglobin: 11.9 g/dL — ABNORMAL LOW (ref 12.0–15.0)
MCH: 28.3 pg (ref 26.0–34.0)
MCHC: 33.8 g/dL (ref 30.0–36.0)

## 2012-06-21 LAB — URINALYSIS
Hgb urine dipstick: NEGATIVE
Ketones, ur: NEGATIVE mg/dL
Nitrite: POSITIVE — AB
Specific Gravity, Urine: 1.022 (ref 1.005–1.030)
Urobilinogen, UA: 0.2 mg/dL (ref 0.0–1.0)

## 2012-06-21 MED ORDER — HYDROMORPHONE HCL PF 1 MG/ML IJ SOLN
1.0000 mg | INTRAMUSCULAR | Status: DC | PRN
  Administered 2012-06-21 – 2012-06-25 (×15): 1 mg via INTRAVENOUS
  Filled 2012-06-21 (×15): qty 1

## 2012-06-21 MED ORDER — PANTOPRAZOLE SODIUM 40 MG IV SOLR: 40.0000 mg | Freq: Every day | INTRAVENOUS | Status: DC

## 2012-06-21 MED ORDER — PANTOPRAZOLE SODIUM 40 MG IV SOLR
40.0000 mg | Freq: Every day | INTRAVENOUS | Status: DC
  Administered 2012-06-21 – 2012-06-25 (×5): 40 mg via INTRAVENOUS
  Filled 2012-06-21 (×6): qty 40

## 2012-06-21 MED ORDER — METRONIDAZOLE IN NACL 5-0.79 MG/ML-% IV SOLN
500.0000 mg | Freq: Three times a day (TID) | INTRAVENOUS | Status: DC
  Administered 2012-06-21 – 2012-06-23 (×5): 500 mg via INTRAVENOUS
  Filled 2012-06-21 (×7): qty 100

## 2012-06-21 MED ORDER — MORPHINE SULFATE 4 MG/ML IJ SOLN: 2.0000 mg | INTRAMUSCULAR | Status: DC | PRN

## 2012-06-21 MED ORDER — ACETAMINOPHEN 325 MG PO TABS: 650.0000 mg | ORAL_TABLET | Freq: Four times a day (QID) | ORAL | Status: DC | PRN

## 2012-06-21 MED ORDER — CIPROFLOXACIN IN D5W 400 MG/200ML IV SOLN
400.0000 mg | Freq: Two times a day (BID) | INTRAVENOUS | Status: DC
  Administered 2012-06-21 – 2012-06-23 (×4): 400 mg via INTRAVENOUS
  Filled 2012-06-21 (×5): qty 200

## 2012-06-21 MED ORDER — ACETAMINOPHEN 650 MG RE SUPP: 650.0000 mg | Freq: Four times a day (QID) | RECTAL | Status: DC | PRN

## 2012-06-21 MED ORDER — SODIUM CHLORIDE 0.9 % IV SOLN: INTRAVENOUS | Status: DC

## 2012-06-21 MED ORDER — IOHEXOL 300 MG/ML  SOLN
100.0000 mL | Freq: Once | INTRAMUSCULAR | Status: AC | PRN
  Administered 2012-06-21: 100 mL via INTRAVENOUS

## 2012-06-21 MED ORDER — DEXTROSE-NACL 5-0.9 % IV SOLN
INTRAVENOUS | Status: DC
  Administered 2012-06-21 – 2012-06-27 (×15): via INTRAVENOUS
  Filled 2012-06-21 (×2): qty 1000

## 2012-06-21 MED ORDER — SODIUM CHLORIDE 0.9 % IV SOLN: 1.0000 g | INTRAVENOUS | Status: DC

## 2012-06-21 MED ORDER — ENOXAPARIN SODIUM 40 MG/0.4ML ~~LOC~~ SOLN
40.0000 mg | SUBCUTANEOUS | Status: DC
  Administered 2012-06-21 – 2012-06-27 (×7): 40 mg via SUBCUTANEOUS
  Filled 2012-06-21 (×8): qty 0.4

## 2012-06-21 MED ORDER — HEPARIN SODIUM (PORCINE) 5000 UNIT/ML IJ SOLN: 5000.0000 [IU] | Freq: Three times a day (TID) | INTRAMUSCULAR | Status: DC

## 2012-06-21 MED ORDER — ONDANSETRON HCL 4 MG/2ML IJ SOLN
4.0000 mg | Freq: Four times a day (QID) | INTRAMUSCULAR | Status: DC | PRN
  Administered 2012-06-21 – 2012-06-26 (×14): 4 mg via INTRAVENOUS
  Filled 2012-06-21 (×14): qty 2

## 2012-06-21 MED ORDER — ONDANSETRON HCL 4 MG/2ML IJ SOLN: 4.0000 mg | Freq: Four times a day (QID) | INTRAMUSCULAR | Status: DC | PRN

## 2012-06-21 MED ORDER — IOHEXOL 300 MG/ML  SOLN
30.0000 mL | Freq: Once | INTRAMUSCULAR | Status: AC | PRN
  Administered 2012-06-21: 30 mL via ORAL

## 2012-06-21 NOTE — Telephone Encounter (Signed)
Called pt at [facility] to notify her of her CT results showing a larger diverticular abscess per Dr Donne Hazel. The pt needs to be admitted to [facility] to get IV antibiotics today. I called [facility] to do a direct admit for the pt to be admitted directly to [facility]. Dr Donne Hazel notified Dr Hulen Skains at [facility] about the pt coming to the hospital. The pt understands and will go directly to [facility] admitting.

## 2012-06-21 NOTE — Progress Notes (Signed)
Assessed veins for IV insertion; pt will like to have IV in left hand; veins to left hand very tortuous.  Pt reports she is a hard stick and veins collapse at times. Paged IV team to attempt IV stick.

## 2012-06-21 NOTE — H&P (Signed)
Sheri Calderon is an 47 y.o. female.   Chief Complaint: Abdominal pain  And recurrent diverticulitis with abscess HPI: discharged last Thursday with diverticulitis and known abscess which at that time was too small to drain.  Call the office today because her symptms have returned and worsened.  No fevers or chill, no nausea or vomiting, but increased pain in the LLQ.  After she eats she gets intense abdominal pain in the LLQ, almost like spasms.    Patient was sent for a repeat CT of the abdomen and pelvis which demonstrated an increased sized abscess associated with peri-diverticular stranding.  Dr. Donne Hazel talked to the patient for readmission.  Past Medical History  Diagnosis Date  . Atrial fibrillation   . Lower extremity edema   . Restless legs syndrome     Past Surgical History  Procedure Laterality Date  . Tubal ligation      Family History  Problem Relation Age of Onset  . Diabetes Mother   . Heart disease Mother   . Stomach cancer Mother   . Cancer Mother     Ovarian CA  . COPD Father   . Hypertension Father   . Hyperlipidemia Father    Social History:  reports that she quit smoking about 2 weeks ago. She does not have any smokeless tobacco history on file. She reports that  drinks alcohol. She reports that she does not use illicit drugs.  Allergies: No Known Allergies  Medications Prior to Admission  Medication Sig Dispense Refill  . ciprofloxacin (CIPRO) 500 MG tablet Take 1 tablet (500 mg total) by mouth 2 (two) times daily.  24 tablet  0  . HYDROcodone-acetaminophen (NORCO/VICODIN) 5-325 MG per tablet Take 1 tablet by mouth every 6 (six) hours as needed for pain.      . metroNIDAZOLE (FLAGYL) 500 MG tablet Take 500 mg by mouth every 8 (eight) hours.      . naproxen sodium (ANAPROX) 220 MG tablet Take 440 mg by mouth every 3 (three) hours as needed (for stomach, back pain).       . [DISCONTINUED] HYDROcodone-acetaminophen (NORCO/VICODIN) 5-325 MG per tablet Take 1  tablet by mouth every 6 (six) hours as needed for pain.  30 tablet  0  . [DISCONTINUED] metroNIDAZOLE (FLAGYL) 500 MG tablet Take 1 tablet (500 mg total) by mouth every 8 (eight) hours.  24 tablet  0    Results for orders placed in visit on 06/20/12 (from the past 48 hour(s))  CBC WITH DIFFERENTIAL     Status: Abnormal   Collection Time    06/20/12  2:08 PM      Result Value Range   WBC 10.6 (*) 4.0 - 10.5 K/uL   RBC 4.44  3.87 - 5.11 MIL/uL   Hemoglobin 12.5  12.0 - 15.0 g/dL   HCT 36.6  36.0 - 46.0 %   MCV 82.4  78.0 - 100.0 fL   MCH 28.2  26.0 - 34.0 pg   MCHC 34.2  30.0 - 36.0 g/dL   RDW 13.3  11.5 - 15.5 %   Platelets 431 (*) 150 - 400 K/uL   Neutrophils Relative 73  43 - 77 %   Neutro Abs 7.8 (*) 1.7 - 7.7 K/uL   Lymphocytes Relative 17  12 - 46 %   Lymphs Abs 1.8  0.7 - 4.0 K/uL   Monocytes Relative 7  3 - 12 %   Monocytes Absolute 0.7  0.1 - 1.0 K/uL   Eosinophils  Relative 2  0 - 5 %   Eosinophils Absolute 0.3  0.0 - 0.7 K/uL   Basophils Relative 1  0 - 1 %   Basophils Absolute 0.1  0.0 - 0.1 K/uL   Smear Review Criteria for review not met    URINALYSIS     Status: Abnormal   Collection Time    06/20/12  2:08 PM      Result Value Range   Color, Urine YELLOW  YELLOW   APPearance CLEAR  CLEAR   Specific Gravity, Urine 1.022  1.005 - 1.030   pH 6.5  5.0 - 8.0   Glucose, UA NEG  NEG mg/dL   Bilirubin Urine NEG  NEG   Ketones, ur NEG  NEG mg/dL   Hgb urine dipstick NEG  NEG   Protein, ur NEG  NEG mg/dL   Urobilinogen, UA 0.2  0.0 - 1.0 mg/dL   Nitrite POS (*) NEG   Leukocytes, UA NEG  NEG   Ct Abdomen Pelvis W Contrast  06/21/2012  *RADIOLOGY REPORT*  Clinical Data: Follow-up diverticulitis and diverticular abscess. New right-sided lower abdominal pain.  CT ABDOMEN AND PELVIS WITH CONTRAST  Technique:  Multidetector CT imaging of the abdomen and pelvis was performed following the standard protocol during bolus administration of intravenous contrast.  Contrast: 65m  OMNIPAQUE IOHEXOL 300 MG/ML  SOLN, 1058mOMNIPAQUE IOHEXOL 300 MG/ML  SOLN  Comparison: 06/08/2012  Findings: Lung bases are clear.  No effusions.  Heart is normal size.  Liver, gallbladder, spleen, pancreas, adrenals, stomach unremarkable.  Numerous bilateral renal parapelvic cysts, stable. No hydronephrosis.  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  Inflammatory stranding again noted around the sigmoid colon.  There is enlarging extraluminal fluid collection anterior to the sigmoid colon and posterior to the uterus.  The largest area now measures 3.7 x 2.7 cm.  This area previously measured 2.4 x 2.0 cm when measured at the same level and in the same planes.  The amount of inflammatory stranding around the sigmoid colon also likely increased.  2.7 cm left ovarian cyst.  Right ovary unremarkable as is the uterus and urinary bladder.  No acute bony abnormality.  IMPRESSION: Increasing inflammatory stranding around the sigmoid colon. Enlarging extraluminal fluid and gas collection compatible with enlarging abscess.  This measures up to 3.7 cm.  Given its location between the sigmoid colon and uterus, this likely is not amenable to percutaneous drainage.   Original Report Authenticated By: KeRolm BaptiseM.D.     Review of Systems  Constitutional: Negative for fever and chills.  HENT: Negative.   Respiratory: Negative.   Cardiovascular: Negative.   Gastrointestinal: Positive for abdominal pain. Negative for heartburn, nausea, vomiting, diarrhea and constipation.  Genitourinary: Negative.   Musculoskeletal: Negative.   Skin: Negative.   Neurological: Negative.   Endo/Heme/Allergies: Negative.   Psychiatric/Behavioral: Negative.     Blood pressure 117/85, pulse 94, temperature 97.8 F (36.6 C), temperature source Oral, resp. rate 18, last menstrual period 06/07/2012, SpO2 97.00%. Physical Exam  Constitutional: She is oriented to person, place, and time. She appears well-developed and  well-nourished.  HENT:  Head: Normocephalic and atraumatic.  Right Ear: External ear normal.  Left Ear: External ear normal.  Eyes: Conjunctivae and EOM are normal. Pupils are equal, round, and reactive to light.  Neck: Normal range of motion. Neck supple.  Cardiovascular: Normal rate, regular rhythm and normal heart sounds.   Respiratory: Effort normal and breath sounds normal.  GI: Soft. Normal appearance  and bowel sounds are normal. There is tenderness in the left lower quadrant. There is rebound and guarding.  Musculoskeletal: Normal range of motion.  Neurological: She is alert and oriented to person, place, and time. She has normal reflexes.  Skin: Skin is warm and dry.  Psychiatric: She has a normal mood and affect. Her behavior is normal. Judgment and thought content normal.     Assessment/Plan Acute diverticulitis with abscess, still not large enough to require drainage.  The patient has been readmitted to the hospital less than one week after being discharge for the same diagnosis.  We will try to get her symptoms resolved, then likely go ahead and prep her and try to perform a one stage procedure while in the hospital this time.  Gwenyth Ober 06/21/2012, 6:18 PM

## 2012-06-21 NOTE — Telephone Encounter (Signed)
Ms Urton has worsening right sided pain after recent hosp for first episode diverticulitis, she has very mildly elevated wbc and nl ua except for nitrite.  Will send for ct today.  If has fever, worse pain will need to go to er

## 2012-06-21 NOTE — Telephone Encounter (Signed)
Patient called again today to state that pain continues.  Patient denies any other symptoms.  Spoke with Donne Hazel MD who will order CT scan for patient.

## 2012-06-22 LAB — CBC
HCT: 33.5 % — ABNORMAL LOW (ref 36.0–46.0)
Hemoglobin: 11.3 g/dL — ABNORMAL LOW (ref 12.0–15.0)
MCH: 28.3 pg (ref 26.0–34.0)
MCHC: 33.7 g/dL (ref 30.0–36.0)

## 2012-06-22 LAB — BASIC METABOLIC PANEL
BUN: 7 mg/dL (ref 6–23)
Chloride: 104 mEq/L (ref 96–112)
GFR calc Af Amer: >90 mL/min (ref >90)
Glucose, Bld: 97 mg/dL (ref 70–99)
Potassium: 3.4 mEq/L — ABNORMAL LOW (ref 3.5–5.1)

## 2012-06-22 LAB — PROTIME-INR: INR: 1.18 (ref 0.00–1.49)

## 2012-06-22 MED ORDER — BIOTENE DRY MOUTH MT LIQD
15.0000 mL | Freq: Two times a day (BID) | OROMUCOSAL | Status: DC
  Administered 2012-06-22 – 2012-06-27 (×4): 15 mL via OROMUCOSAL

## 2012-06-22 MED ORDER — CHLORHEXIDINE GLUCONATE 0.12 % MT SOLN
15.0000 mL | Freq: Two times a day (BID) | OROMUCOSAL | Status: DC
  Administered 2012-06-22 – 2012-06-27 (×3): 15 mL via OROMUCOSAL
  Filled 2012-06-22 (×15): qty 15

## 2012-06-22 NOTE — Progress Notes (Signed)
Will see if the patient gets well enough to start bowel prep tomorrow for colectomy.  Sheri Calderon. [name], MD, [place] [phone] [facility] Surgery

## 2012-06-22 NOTE — Progress Notes (Signed)
Patient ID: Sheri Calderon, female   DOB: 1965-06-03, 47 y.o.   MRN: [id number]    Subjective: Pt feels a little better, but pain still persists.  Objective: Vital signs in last 24 hours: Temp:  [97.6 F (36.4 C)-98.2 F (36.8 C)] 98.2 F (36.8 C) (04/23 0547) Pulse Rate:  [77-96] 77 (04/23 0547) Resp:  [18-20] 20 (04/23 0547) BP: (115-124)/(74-89) 115/74 mmHg (04/23 0547) SpO2:  [96 %-97 %] 96 % (04/23 0547) Weight:  [178 lb 8 oz (80.967 kg)] 178 lb 8 oz (80.967 kg) (04/22 1700) Last BM Date: 06/22/12  Intake/Output from previous day: 04/22 0701 - 04/23 0700 In: 1587.5 [I.V.:1187.5; IV Piggyback:400] Out: 700 [Urine:700] Intake/Output this shift: Total I/O In: 0  Out: 100 [Urine:100]  PE: Abd: soft, still quite tender in LLQ, +BS, ND Heart: regular  Lab Results:   Recent Labs  06/21/12 1822 06/22/12 0455  WBC 11.0* 9.3  HGB 11.9* 11.3*  HCT 35.2* 33.5*  PLT 368 357   BMET  Recent Labs  06/21/12 1822 06/22/12 0455  NA  --  137  K  --  3.4*  CL  --  104  CO2  --  26  GLUCOSE  --  97  BUN  --  7  CREATININE 0.77 0.74  CALCIUM  --  8.2*   PT/INR  Recent Labs  06/22/12 0455  LABPROT 14.8  INR 1.18   CMP     Component Value Date/Time   NA 137 06/22/2012 0455   K 3.4* 06/22/2012 0455   CL 104 06/22/2012 0455   CO2 26 06/22/2012 0455   GLUCOSE 97 06/22/2012 0455   BUN 7 06/22/2012 0455   CREATININE 0.74 06/22/2012 0455   CALCIUM 8.2* 06/22/2012 0455   PROT 7.3 06/07/2012 1938   ALBUMIN 3.5 06/07/2012 1938   AST 10 06/07/2012 1938   ALT 9 06/07/2012 1938   ALKPHOS 63 06/07/2012 1938   BILITOT 0.3 06/07/2012 1938   GFRNONAA >90 06/22/2012 0455   GFRAA >90 06/22/2012 0455   Lipase     Component Value Date/Time   LIPASE 21 06/07/2012 1938       Studies/Results: Ct Abdomen Pelvis W Contrast  06/21/2012  *RADIOLOGY REPORT*  Clinical Data: Follow-up diverticulitis and diverticular abscess. New right-sided lower abdominal pain.  CT ABDOMEN AND PELVIS WITH CONTRAST   Technique:  Multidetector CT imaging of the abdomen and pelvis was performed following the standard protocol during bolus administration of intravenous contrast.  Contrast: 24m OMNIPAQUE IOHEXOL 300 MG/ML  SOLN, 1035mOMNIPAQUE IOHEXOL 300 MG/ML  SOLN  Comparison: 06/08/2012  Findings: Lung bases are clear.  No effusions.  Heart is normal size.  Liver, gallbladder, spleen, pancreas, adrenals, stomach unremarkable.  Numerous bilateral renal parapelvic cysts, stable. No hydronephrosis.  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  Inflammatory stranding again noted around the sigmoid colon.  There is enlarging extraluminal fluid collection anterior to the sigmoid colon and posterior to the uterus.  The largest area now measures 3.7 x 2.7 cm.  This area previously measured 2.4 x 2.0 cm when measured at the same level and in the same planes.  The amount of inflammatory stranding around the sigmoid colon also likely increased.  2.7 cm left ovarian cyst.  Right ovary unremarkable as is the uterus and urinary bladder.  No acute bony abnormality.  IMPRESSION: Increasing inflammatory stranding around the sigmoid colon. Enlarging extraluminal fluid and gas collection compatible with enlarging abscess.  This measures up to 3.7  cm.  Given its location between the sigmoid colon and uterus, this likely is not amenable to percutaneous drainage.   Original Report Authenticated By: Rolm Baptise, M.D.     Anti-infectives: Anti-infectives   Start     Dose/Rate Route Frequency Ordered Stop   06/21/12 2000  ciprofloxacin (CIPRO) IVPB 400 mg     400 mg 200 mL/hr over 60 Minutes Intravenous Every 12 hours 06/21/12 1803     06/21/12 2000  metroNIDAZOLE (FLAGYL) IVPB 500 mg     500 mg 100 mL/hr over 60 Minutes Intravenous Every 8 hours 06/21/12 1803         Assessment/Plan  1. Persistent diverticulitis with enlarging abscess  Plan: 1. Continue NPO for now x ice chips 2. Given this process worsened despite  continued oral abx therapy at home, she may require cooling off here and surgery prior to discharge.  i had a long d/w the patient about this today.  She is a little anxious about this, but wants to get better however is necessary. 3. If patient's symptoms are not improving tomorrow, may try changing abx therapy to [place].   LOS: 1 day    Minnette Merida E 06/22/2012, 9:33 AM Pager: [phone]

## 2012-06-23 MED ORDER — SODIUM CHLORIDE 0.9 % IV SOLN
1.0000 g | INTRAVENOUS | Status: DC
  Administered 2012-06-23 – 2012-06-26 (×4): 1 g via INTRAVENOUS
  Filled 2012-06-23 (×4): qty 1

## 2012-06-23 MED ORDER — TAMSULOSIN HCL 0.4 MG PO CAPS
0.4000 mg | ORAL_CAPSULE | Freq: Every day | ORAL | Status: DC
  Administered 2012-06-23 – 2012-06-27 (×5): 0.4 mg via ORAL
  Filled 2012-06-23 (×6): qty 1

## 2012-06-23 NOTE — Progress Notes (Signed)
Patient stated that she could not pee and stated she felt very full. Bladder scanner results were 281cc. Patient stated that she was going to try to pee. Patient voided 250cc of tea colored urine in bathroom. Patient stated that she felt relief now. Will continue to monitor patient. Ranelle Oyster, RN

## 2012-06-23 NOTE — Care Management Note (Unsigned)
    Page 1 of 1   06/24/2012     4:20:43 PM   CARE MANAGEMENT NOTE 06/24/2012  Patient:  Sheri Calderon, Sheri Calderon   Account Number:  [id number]  Date Initiated:  06/23/2012  Documentation initiated by:  Tomi Bamberger  Subjective/Objective Assessment:   dx diverticulitis with abscess  admit- lives with spouse.  pta indep.  plan for colectomy next week.     Action/Plan:   Anticipated DC Date:  06/28/2012   Anticipated DC Plan:  [place]  CM consult      Choice offered to / List presented to:             Status of service:  In process, will continue to follow Medicare Important Message given?   (If response is "NO", the following Medicare IM given date fields will be blank) Date Medicare IM given:   Date Additional Medicare IM given:    Discharge Disposition:    Per UR Regulation:  Reviewed for med. necessity/level of care/duration of stay  If discussed at [place] of Stay Meetings, dates discussed:    Comments:  06/24/12 16:19 Tomi Bamberger RN, BSN [phone] patient conts on clears and will need a colectomy next week per surgery.  NCM will continue to follow for dc needs.  06/23/12 16:00 Tomi Bamberger RN ,BSN [phone] patient with diverticulitis with abscess, patient is npo with ice chips.  NCM will continue to follow for dc needs.

## 2012-06-23 NOTE — Progress Notes (Signed)
Subjective: Pt states abdominal pain is same from yesterday and she rates the pain as 6/10.  She c/o urinary difficulty and states once she receives pain medication she is able to urinate without difficulty.    Objective: Vital signs in last 24 hours: Temp:  [98 F (36.7 C)-98.1 F (36.7 C)] 98 F (36.7 C) (04/24 0550) Pulse Rate:  [73-82] 77 (04/24 0550) Resp:  [18-20] 18 (04/24 0550) BP: (103-122)/(55-78) 122/78 mmHg (04/24 0550) SpO2:  [96 %-97 %] 96 % (04/24 0550) Last BM Date: 06/22/12  Intake/Output from previous day: 04/23 0701 - 04/24 0700 In: 1808.3 [I.V.:1408.3; IV Piggyback:400] Out: 350 [Urine:350] Intake/Output this shift:    PE: Gen:  Alert, NAD, pleasant Card:  RRR, no M/G/R heard Pulm:  CTA, no W/R/R Abd: Soft, ND, RLQ/LLQ TTP, +BS  Lab Results:   Recent Labs  06/21/12 1822 06/22/12 0455  WBC 11.0* 9.3  HGB 11.9* 11.3*  HCT 35.2* 33.5*  PLT 368 357   BMET  Recent Labs  06/21/12 1822 06/22/12 0455  NA  --  137  K  --  3.4*  CL  --  104  CO2  --  26  GLUCOSE  --  97  BUN  --  7  CREATININE 0.77 0.74  CALCIUM  --  8.2*   PT/INR  Recent Labs  06/22/12 0455  LABPROT 14.8  INR 1.18   CMP     Component Value Date/Time   NA 137 06/22/2012 0455   K 3.4* 06/22/2012 0455   CL 104 06/22/2012 0455   CO2 26 06/22/2012 0455   GLUCOSE 97 06/22/2012 0455   BUN 7 06/22/2012 0455   CREATININE 0.74 06/22/2012 0455   CALCIUM 8.2* 06/22/2012 0455   PROT 7.3 06/07/2012 1938   ALBUMIN 3.5 06/07/2012 1938   AST 10 06/07/2012 1938   ALT 9 06/07/2012 1938   ALKPHOS 63 06/07/2012 1938   BILITOT 0.3 06/07/2012 1938   GFRNONAA >90 06/22/2012 0455   GFRAA >90 06/22/2012 0455   Lipase     Component Value Date/Time   LIPASE 21 06/07/2012 1938       Studies/Results: Ct Abdomen Pelvis W Contrast  06/21/2012  *RADIOLOGY REPORT*  Clinical Data: Follow-up diverticulitis and diverticular abscess. New right-sided lower abdominal pain.  CT ABDOMEN AND PELVIS WITH  CONTRAST  Technique:  Multidetector CT imaging of the abdomen and pelvis was performed following the standard protocol during bolus administration of intravenous contrast.  Contrast: 57m OMNIPAQUE IOHEXOL 300 MG/ML  SOLN, 1033mOMNIPAQUE IOHEXOL 300 MG/ML  SOLN  Comparison: 06/08/2012  Findings: Lung bases are clear.  No effusions.  Heart is normal size.  Liver, gallbladder, spleen, pancreas, adrenals, stomach unremarkable.  Numerous bilateral renal parapelvic cysts, stable. No hydronephrosis.  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  Inflammatory stranding again noted around the sigmoid colon.  There is enlarging extraluminal fluid collection anterior to the sigmoid colon and posterior to the uterus.  The largest area now measures 3.7 x 2.7 cm.  This area previously measured 2.4 x 2.0 cm when measured at the same level and in the same planes.  The amount of inflammatory stranding around the sigmoid colon also likely increased.  2.7 cm left ovarian cyst.  Right ovary unremarkable as is the uterus and urinary bladder.  No acute bony abnormality.  IMPRESSION: Increasing inflammatory stranding around the sigmoid colon. Enlarging extraluminal fluid and gas collection compatible with enlarging abscess.  This measures up to 3.7 cm.  Given  its location between the sigmoid colon and uterus, this likely is not amenable to percutaneous drainage.   Original Report Authenticated By: Rolm Baptise, M.D.     Anti-infectives: Anti-infectives   Start     Dose/Rate Route Frequency Ordered Stop   06/23/12 0900  ertapenem (INVANZ) 1 g in sodium chloride 0.9 % 50 mL IVPB     1 g 100 mL/hr over 30 Minutes Intravenous Every 24 hours 06/23/12 0829     06/21/12 2000  ciprofloxacin (CIPRO) IVPB 400 mg  Status:  Discontinued     400 mg 200 mL/hr over 60 Minutes Intravenous Every 12 hours 06/21/12 1803 06/23/12 0829   06/21/12 2000  metroNIDAZOLE (FLAGYL) IVPB 500 mg  Status:  Discontinued     500 mg 100 mL/hr over  60 Minutes Intravenous Every 8 hours 06/21/12 1803 06/23/12 0829       Assessment/Plan 1. Persistent diverticulitis with enlarging abscess 2. Bladder spasms/urinary difficulty  Plan: 1. Cont NPO with ice chips today  2. Will prescribe Flomax for bladder spasms/urinary difficulty 3. Since current ABX therapy is not effective, will switch to Invanz and see if her pain improves.  4. Cont dilaudid for pain control   LOS: 2 days    Donah Driver 06/23/2012, 8:33 AM [phone]

## 2012-06-24 NOTE — Progress Notes (Signed)
  Subjective: Pt states that her abdominal pain is slightly less today and rates it at 5/10.  She states she has not had to take her pain medication as frequently and did not take any overnight.  Her urinary difficulty has resolved with Flomax and has no complaints currently.    Objective: Vital signs in last 24 hours: Temp:  [98 F (36.7 C)-98.5 F (36.9 C)] 98.5 F (36.9 C) (04/25 0528) Pulse Rate:  [74-107] 107 (04/25 0528) Resp:  [18] 18 (04/25 0528) BP: (112-123)/(70-75) 112/70 mmHg (04/25 0528) SpO2:  [95 %-98 %] 95 % (04/25 0528) Last BM Date: 06/23/12  Intake/Output from previous day: 04/24 0701 - 04/25 0700 In: 2827.1 [I.V.:2827.1] Out: 650 [Urine:650] Intake/Output this shift: Total I/O In: 156.3 [I.V.:156.3] Out: -   PE: Gen:  Alert, NAD, pleasant Card:  RRR, no M/G/R heard Pulm:  CTA, no W/R/R Abd: Soft, ND, LLQ TTP, +BS - slightly hyperactive  Lab Results:   Recent Labs  06/21/12 1822 06/22/12 0455  WBC 11.0* 9.3  HGB 11.9* 11.3*  HCT 35.2* 33.5*  PLT 368 357   BMET  Recent Labs  06/21/12 1822 06/22/12 0455  NA  --  137  K  --  3.4*  CL  --  104  CO2  --  26  GLUCOSE  --  97  BUN  --  7  CREATININE 0.77 0.74  CALCIUM  --  8.2*   PT/INR  Recent Labs  06/22/12 0455  LABPROT 14.8  INR 1.18   CMP     Component Value Date/Time   NA 137 06/22/2012 0455   K 3.4* 06/22/2012 0455   CL 104 06/22/2012 0455   CO2 26 06/22/2012 0455   GLUCOSE 97 06/22/2012 0455   BUN 7 06/22/2012 0455   CREATININE 0.74 06/22/2012 0455   CALCIUM 8.2* 06/22/2012 0455   PROT 7.3 06/07/2012 1938   ALBUMIN 3.5 06/07/2012 1938   AST 10 06/07/2012 1938   ALT 9 06/07/2012 1938   ALKPHOS 63 06/07/2012 1938   BILITOT 0.3 06/07/2012 1938   GFRNONAA >90 06/22/2012 0455   GFRAA >90 06/22/2012 0455   Lipase     Component Value Date/Time   LIPASE 21 06/07/2012 1938       Studies/Results: No results found.  Anti-infectives: Anti-infectives   Start     Dose/Rate Route  Frequency Ordered Stop   06/23/12 0900  ertapenem (INVANZ) 1 g in sodium chloride 0.9 % 50 mL IVPB     1 g 100 mL/hr over 30 Minutes Intravenous Every 24 hours 06/23/12 0829     06/21/12 2000  ciprofloxacin (CIPRO) IVPB 400 mg  Status:  Discontinued     400 mg 200 mL/hr over 60 Minutes Intravenous Every 12 hours 06/21/12 1803 06/23/12 0829   06/21/12 2000  metroNIDAZOLE (FLAGYL) IVPB 500 mg  Status:  Discontinued     500 mg 100 mL/hr over 60 Minutes Intravenous Every 8 hours 06/21/12 1803 06/23/12 0829       Assessment/Plan 1. Persistent diverticulitis with enlarging abscess  2. Bladder spasms/urinary difficulty   Plan:  1. Will initiate intermittent clear liquids today with cont ice chips, as tolerated 2. Cont current ABX therapy - pain has decreased slightly since yesterday but is still too tender to initiate bowel prep.   3. Cont Flomax for bladder spasms/urinary difficulty 4. Cont dilaudid for pain control    LOS: 3 days    Donah Driver 06/24/2012, 8:25 AM [phone]

## 2012-06-24 NOTE — Progress Notes (Signed)
May be able to start a slow bowel prep over the weekend for colectomy one stage procedure next week.  Sheri Calderon. [name], MD, [place] [phone] [facility] Surgery

## 2012-06-24 NOTE — Progress Notes (Signed)
Stable but not able to take prep.  Keep on ice chips.  Sheri Calderon. [name], MD, [place] [phone] [facility] Surgery

## 2012-06-25 LAB — CBC
HCT: 28.2 % — ABNORMAL LOW (ref 36.0–46.0)
Hemoglobin: 9.9 g/dL — ABNORMAL LOW (ref 12.0–15.0)
MCH: 28.8 pg (ref 26.0–34.0)
MCHC: 35.1 g/dL (ref 30.0–36.0)
MCV: 82 fL (ref 78.0–100.0)
Platelets: 338 10*3/uL (ref 150–400)
RBC: 3.44 MIL/uL — ABNORMAL LOW (ref 3.87–5.11)
RDW: 12.5 % (ref 11.5–15.5)
WBC: 5.9 10*3/uL (ref 4.0–10.5)

## 2012-06-25 MED ORDER — HYDROMORPHONE HCL PF 1 MG/ML IJ SOLN
0.5000 mg | INTRAMUSCULAR | Status: DC | PRN
  Administered 2012-06-25 – 2012-06-28 (×25): 1 mg via INTRAVENOUS
  Filled 2012-06-25 (×19): qty 1
  Filled 2012-06-25: qty 2
  Filled 2012-06-25 (×4): qty 1

## 2012-06-25 NOTE — Progress Notes (Signed)
Patient ID: Sheri Calderon, female   DOB: [birth year], 47 y.o.   MRN: [id number]    Subjective: Pt c/o worsening pain this morning after initiating clear liquids yesterday.  Pain meds not controlling her pain well (only 1 mg q 4h).  C/o increasing rectal and low back pressure  Objective: Vital signs in last 24 hours: Temp:  [97.3 F (36.3 C)-98.2 F (36.8 C)] 98.2 F (36.8 C) (04/26 0510) Pulse Rate:  [70-74] 70 (04/26 0510) Resp:  [18-20] 20 (04/26 0510) BP: (138-143)/(85-90) 138/85 mmHg (04/26 0510) SpO2:  [96 %-99 %] 96 % (04/26 0510) Last BM Date: 06/24/12  Intake/Output from previous day: 04/25 0701 - 04/26 0700 In: 3120 [P.O.:120; I.V.:3000] Out: -  Intake/Output this shift: Total I/O In: 156.3 [I.V.:156.3] Out: -   PE: Abd: soft, increase in focal LLQ tenderness today, hypoactive BS, ND Heart: regular Lungs: CTAB  Lab Results:  No results found for this basename: WBC, HGB, HCT, PLT,  in the last 72 hours BMET No results found for this basename: NA, K, CL, CO2, GLUCOSE, BUN, CREATININE, CALCIUM,  in the last 72 hours PT/INR No results found for this basename: LABPROT, INR,  in the last 72 hours CMP     Component Value Date/Time   NA 137 06/22/2012 0455   K 3.4* 06/22/2012 0455   CL 104 06/22/2012 0455   CO2 26 06/22/2012 0455   GLUCOSE 97 06/22/2012 0455   BUN 7 06/22/2012 0455   CREATININE 0.74 06/22/2012 0455   CALCIUM 8.2* 06/22/2012 0455   PROT 7.3 06/07/2012 1938   ALBUMIN 3.5 06/07/2012 1938   AST 10 06/07/2012 1938   ALT 9 06/07/2012 1938   ALKPHOS 63 06/07/2012 1938   BILITOT 0.3 06/07/2012 1938   GFRNONAA >90 06/22/2012 0455   GFRAA >90 06/22/2012 0455   Lipase     Component Value Date/Time   LIPASE 21 06/07/2012 1938       Studies/Results: No results found.  Anti-infectives: Anti-infectives   Start     Dose/Rate Route Frequency Ordered Stop   06/23/12 0900  ertapenem (INVANZ) 1 g in sodium chloride 0.9 % 50 mL IVPB     1 g 100 mL/hr over 30 Minutes  Intravenous Every 24 hours 06/23/12 0829     06/21/12 2000  ciprofloxacin (CIPRO) IVPB 400 mg  Status:  Discontinued     400 mg 200 mL/hr over 60 Minutes Intravenous Every 12 hours 06/21/12 1803 06/23/12 0829   06/21/12 2000  metroNIDAZOLE (FLAGYL) IVPB 500 mg  Status:  Discontinued     500 mg 100 mL/hr over 60 Minutes Intravenous Every 8 hours 06/21/12 1803 06/23/12 0829       Assessment/Plan  1. Diverticulitis with abscess  Plan: 1. Will make npo and dc clear liquids given increase in abdominal pain 2. Increase pain meds to q2 hrs for better pain control. 3. Will d/w MD.  Tomorrow the patient would be eligible for repeat CT scan to see if this abscess is enlarging or becomes drainable, which i doubt, vs going ahead and discussing surgery without being able to prep her.  Hopefully, resuming bowel rest and further Invanz therapy will cool her off again so we can attempt to bowel prep her before surgery.   4. No labs have been check in several days, will check a cbc today and cbc, bmet in am.   LOS: 4 days    Enrico Eaddy E 06/25/2012, 8:46 AM Pager: [phone]

## 2012-06-25 NOTE — Progress Notes (Signed)
Agree with PA-Osborne's note NPO today May need CT Sun/Mon to re-eval her diverticulitis if pain not better. Mobilize Abx

## 2012-06-26 ENCOUNTER — Inpatient Hospital Stay (HOSPITAL_COMMUNITY): Payer: BC Managed Care – PPO

## 2012-06-26 DIAGNOSIS — G2581 Restless legs syndrome: Secondary | ICD-10-CM | POA: Insufficient documentation

## 2012-06-26 LAB — CBC
Hemoglobin: 10.5 g/dL — ABNORMAL LOW (ref 12.0–15.0)
Platelets: 380 10*3/uL (ref 150–400)
RBC: 3.69 MIL/uL — ABNORMAL LOW (ref 3.87–5.11)
WBC: 7.7 10*3/uL (ref 4.0–10.5)

## 2012-06-26 LAB — BASIC METABOLIC PANEL
CO2: 26 mEq/L (ref 19–32)
Calcium: 8.1 mg/dL — ABNORMAL LOW (ref 8.4–10.5)
Glucose, Bld: 103 mg/dL — ABNORMAL HIGH (ref 70–99)
Potassium: 2.9 mEq/L — ABNORMAL LOW (ref 3.5–5.1)
Sodium: 139 mEq/L (ref 135–145)

## 2012-06-26 MED ORDER — DIPHENHYDRAMINE HCL 50 MG/ML IJ SOLN
12.5000 mg | Freq: Four times a day (QID) | INTRAMUSCULAR | Status: DC | PRN
  Administered 2012-06-26 – 2012-06-27 (×2): 12.5 mg via INTRAVENOUS
  Administered 2012-06-27: 25 mg via INTRAVENOUS
  Administered 2012-06-28: 12.5 mg via INTRAVENOUS
  Filled 2012-06-26 (×4): qty 1

## 2012-06-26 MED ORDER — PANTOPRAZOLE SODIUM 40 MG PO TBEC
40.0000 mg | DELAYED_RELEASE_TABLET | Freq: Every day | ORAL | Status: DC
  Administered 2012-06-26 – 2012-06-27 (×2): 40 mg via ORAL
  Filled 2012-06-26 (×2): qty 1

## 2012-06-26 MED ORDER — KETOROLAC TROMETHAMINE 15 MG/ML IJ SOLN
15.0000 mg | Freq: Four times a day (QID) | INTRAMUSCULAR | Status: DC | PRN
  Filled 2012-06-26: qty 2

## 2012-06-26 MED ORDER — POTASSIUM CHLORIDE 10 MEQ/100ML IV SOLN
10.0000 meq | INTRAVENOUS | Status: AC
  Administered 2012-06-26 (×4): 10 meq via INTRAVENOUS
  Filled 2012-06-26 (×4): qty 100

## 2012-06-26 MED ORDER — IOHEXOL 300 MG/ML  SOLN
25.0000 mL | INTRAMUSCULAR | Status: AC
  Administered 2012-06-26: 25 mL via ORAL

## 2012-06-26 MED ORDER — PIPERACILLIN-TAZOBACTAM 3.375 G IVPB
3.3750 g | Freq: Three times a day (TID) | INTRAVENOUS | Status: DC
  Administered 2012-06-26 – 2012-06-28 (×6): 3.375 g via INTRAVENOUS
  Filled 2012-06-26 (×8): qty 50

## 2012-06-26 MED ORDER — MAGIC MOUTHWASH
15.0000 mL | Freq: Four times a day (QID) | ORAL | Status: DC | PRN
  Filled 2012-06-26: qty 15

## 2012-06-26 MED ORDER — ONDANSETRON HCL 4 MG/2ML IJ SOLN
4.0000 mg | INTRAMUSCULAR | Status: DC | PRN
  Administered 2012-06-26 – 2012-06-28 (×4): 4 mg via INTRAVENOUS
  Filled 2012-06-26 (×4): qty 2

## 2012-06-26 MED ORDER — BISACODYL 10 MG RE SUPP: 10.0000 mg | Freq: Two times a day (BID) | RECTAL | Status: DC | PRN

## 2012-06-26 MED ORDER — LIP MEDEX EX OINT
1.0000 "application " | TOPICAL_OINTMENT | Freq: Two times a day (BID) | CUTANEOUS | Status: DC
  Filled 2012-06-26: qty 7

## 2012-06-26 MED ORDER — METOPROLOL TARTRATE 1 MG/ML IV SOLN: 5.0000 mg | Freq: Four times a day (QID) | INTRAVENOUS | Status: DC | PRN

## 2012-06-26 MED ORDER — SACCHAROMYCES BOULARDII 250 MG PO CAPS
250.0000 mg | ORAL_CAPSULE | Freq: Two times a day (BID) | ORAL | Status: DC
  Administered 2012-06-26 – 2012-06-27 (×4): 250 mg via ORAL
  Filled 2012-06-26 (×6): qty 1

## 2012-06-26 MED ORDER — POTASSIUM CHLORIDE CRYS ER 20 MEQ PO TBCR
40.0000 meq | EXTENDED_RELEASE_TABLET | Freq: Every day | ORAL | Status: DC
  Administered 2012-06-26 – 2012-06-27 (×2): 40 meq via ORAL
  Filled 2012-06-26 (×3): qty 2

## 2012-06-26 MED ORDER — ALUM & MAG HYDROXIDE-SIMETH 200-200-20 MG/5ML PO SUSP: 30.0000 mL | Freq: Four times a day (QID) | ORAL | Status: DC | PRN

## 2012-06-26 MED ORDER — BLISTEX EX OINT
TOPICAL_OINTMENT | Freq: Two times a day (BID) | CUTANEOUS | Status: DC
  Administered 2012-06-26 – 2012-06-27 (×2): via TOPICAL
  Filled 2012-06-26 (×2): qty 10

## 2012-06-26 MED ORDER — FLUCONAZOLE IN SODIUM CHLORIDE 200-0.9 MG/100ML-% IV SOLN
200.0000 mg | INTRAVENOUS | Status: DC
  Administered 2012-06-27: 200 mg via INTRAVENOUS
  Filled 2012-06-26 (×2): qty 100

## 2012-06-26 MED ORDER — IOHEXOL 300 MG/ML  SOLN
100.0000 mL | Freq: Once | INTRAMUSCULAR | Status: AC | PRN
  Administered 2012-06-26: 100 mL via INTRAVENOUS

## 2012-06-26 MED ORDER — PROMETHAZINE HCL 25 MG/ML IJ SOLN
12.5000 mg | Freq: Four times a day (QID) | INTRAMUSCULAR | Status: DC | PRN
  Administered 2012-06-26 – 2012-06-27 (×3): 12.5 mg via INTRAVENOUS
  Filled 2012-06-26 (×2): qty 1

## 2012-06-26 MED ORDER — FLUCONAZOLE IN SODIUM CHLORIDE 400-0.9 MG/200ML-% IV SOLN
400.0000 mg | Freq: Once | INTRAVENOUS | Status: AC
  Administered 2012-06-26: 400 mg via INTRAVENOUS
  Filled 2012-06-26: qty 200

## 2012-06-26 MED ORDER — ONDANSETRON HCL 4 MG/2ML IJ SOLN: 4.0000 mg | Freq: Four times a day (QID) | INTRAMUSCULAR | Status: DC | PRN

## 2012-06-26 MED ORDER — ACETAMINOPHEN 650 MG RE SUPP: 650.0000 mg | Freq: Four times a day (QID) | RECTAL | Status: DC | PRN

## 2012-06-26 NOTE — Progress Notes (Signed)
ANTIBIOTIC CONSULT NOTE - INITIAL  Pharmacy Consult for zosyn Indication: diverticulitis  No Known Allergies  Patient Measurements: Height: 5' 7"  (170.2 cm) Weight: 178 lb 8 oz (80.967 kg) IBW/kg (Calculated) : 61.6 Adjusted Body Weight:   Vital Signs: Temp: 98.4 F (36.9 C) (04/27 0500) Temp src: Oral (04/27 0500) BP: 136/71 mmHg (04/27 0500) Pulse Rate: 71 (04/27 0500) Intake/Output from previous day: 04/26 0701 - 04/27 0700 In: 1622.9 [I.V.:1622.9] Out: -  Intake/Output from this shift: Total I/O In: 1658.3 [I.V.:1658.3] Out: -   Labs:  Recent Labs  06/25/12 1142 06/26/12 0651  WBC 5.9 7.7  HGB 9.9* 10.5*  PLT 338 380  CREATININE  --  0.66   Estimated Creatinine Clearance: 95.2 ml/min (by C-G formula based on Cr of 0.66). No results found for this basename: VANCOTROUGH, Corlis Leak, VANCORANDOM, [place], GENTPEAK, [place], [place], TOBRAPEAK, TOBRARND, AMIKACINPEAK, AMIKACINTROU, AMIKACIN,  in the last 72 hours   Microbiology: Recent Results (from the past 720 hour(s))  URINE CULTURE     Status: None   Collection Time    06/08/12  1:00 AM      Result Value Range Status   Specimen Description URINE, CLEAN CATCH   Final   Special Requests NONE   Final   Culture  Setup Time 06/08/2012 01:28   Final   Colony Count 40,000 COLONIES/ML   Final   Culture     Final   Value: Multiple bacterial morphotypes present, none predominant. Suggest appropriate recollection if clinically indicated.   Report Status 06/09/2012 FINAL   Final    Medical History: Past Medical History  Diagnosis Date  . Atrial fibrillation   . Lower extremity edema   . Restless legs syndrome   . Diverticulitis     "this is my 2nd time in hospital w/this in the last 2 wk" (06/21/2012)    Medications:  Scheduled:  . antiseptic oral rinse  15 mL Mouth Rinse q12n4p  . chlorhexidine  15 mL Mouth Rinse BID  . enoxaparin (LOVENOX) injection  40 mg Subcutaneous Q24H  . [START ON  06/27/2012] fluconazole (DIFLUCAN) IV  200 mg Intravenous Q24H  . fluconazole (DIFLUCAN) IV  400 mg Intravenous Once  . [EXPIRED] iohexol  25 mL Oral Q1 Hr x 2  . lip balm  1 application Topical BID  . pantoprazole  40 mg Oral Q1200  . potassium chloride  10 mEq Intravenous Q1 Hr x 4  . potassium chloride  40 mEq Oral Daily  . saccharomyces boulardii  250 mg Oral BID  . tamsulosin  0.4 mg Oral Daily  . [DISCONTINUED] ertapenem  1 g Intravenous Q24H  . [DISCONTINUED] pantoprazole (PROTONIX) IV  40 mg Intravenous QHS   Infusions:  . dextrose 5 % and 0.9% NaCl 125 mL/hr at 06/26/12 0708   Assessment: 47 yo female with diverticulitis will be switched from ertapenem to zosyn.  CrCl ~95  Goal of Therapy:    Plan:  1) Zosyn 3.375g iv q8h (4h infusion).  2) Pharmacy will sign off  Jeanpierre Thebeau, Tsz-Yin 06/26/2012,12:02 PM

## 2012-06-26 NOTE — Progress Notes (Signed)
Patient ID: Sheri Calderon, female   DOB: 01-20-1966, 47 y.o.   MRN: [id number]    Subjective: Pt says pain is a little better, but now nauseated with dry heaving all night.  Just miserable right now.  Objective: Vital signs in last 24 hours: Temp:  [97.7 F (36.5 C)-98.4 F (36.9 C)] 98.4 F (36.9 C) (04/27 0500) Pulse Rate:  [69-75] 71 (04/27 0500) Resp:  [16-18] 16 (04/27 0500) BP: (136-151)/(71-94) 136/71 mmHg (04/27 0500) SpO2:  [95 %-98 %] 95 % (04/27 0500) Last BM Date: 06/25/12  Intake/Output from previous day: 04/26 0701 - 04/27 0700 In: 1622.9 [I.V.:1622.9] Out: -  Intake/Output this shift:    PE: Abd: soft, still focally tender in LLQ, +BS, ND Heart: regular Lungs: CTAB  Lab Results:   Recent Labs  06/25/12 1142 06/26/12 0651  WBC 5.9 7.7  HGB 9.9* 10.5*  HCT 28.2* 29.9*  PLT 338 380   BMET No results found for this basename: NA, K, CL, CO2, GLUCOSE, BUN, CREATININE, CALCIUM,  in the last 72 hours PT/INR No results found for this basename: LABPROT, INR,  in the last 72 hours CMP     Component Value Date/Time   NA 137 06/22/2012 0455   K 3.4* 06/22/2012 0455   CL 104 06/22/2012 0455   CO2 26 06/22/2012 0455   GLUCOSE 97 06/22/2012 0455   BUN 7 06/22/2012 0455   CREATININE 0.74 06/22/2012 0455   CALCIUM 8.2* 06/22/2012 0455   PROT 7.3 06/07/2012 1938   ALBUMIN 3.5 06/07/2012 1938   AST 10 06/07/2012 1938   ALT 9 06/07/2012 1938   ALKPHOS 63 06/07/2012 1938   BILITOT 0.3 06/07/2012 1938   GFRNONAA >90 06/22/2012 0455   GFRAA >90 06/22/2012 0455   Lipase     Component Value Date/Time   LIPASE 21 06/07/2012 1938       Studies/Results: No results found.  Anti-infectives: Anti-infectives   Start     Dose/Rate Route Frequency Ordered Stop   06/23/12 0900  ertapenem (INVANZ) 1 g in sodium chloride 0.9 % 50 mL IVPB     1 g 100 mL/hr over 30 Minutes Intravenous Every 24 hours 06/23/12 0829     06/21/12 2000  ciprofloxacin (CIPRO) IVPB 400 mg  Status:   Discontinued     400 mg 200 mL/hr over 60 Minutes Intravenous Every 12 hours 06/21/12 1803 06/23/12 0829   06/21/12 2000  metroNIDAZOLE (FLAGYL) IVPB 500 mg  Status:  Discontinued     500 mg 100 mL/hr over 60 Minutes Intravenous Every 8 hours 06/21/12 1803 06/23/12 0829       Assessment/Plan  1. Diverticulitis with abscess, not progressing clinically  Plan: 1. Will repeat a CT scan today to evaluate this area of tics as well as abscess.  We were hoping to be able to prep her and do a one stage procedure; however, the patient is struggling enough right now that we are unable to prep her.  She may end up with a colectomy/colostomy, if repeat CT does not show anything still drainable.   LOS: 5 days    Sheri Calderon E 06/26/2012, 8:24 AM Pager: [phone]

## 2012-06-26 NOTE — Progress Notes (Addendum)
06/26/12 Physician called to inform of nausea and not due for another dose till 1117am.She is unable to keep contrast down.

## 2012-06-26 NOTE — Progress Notes (Signed)
Struggling w pain Tired but not toxic Still w LLQ pain Agree w CT scan r/o abscess Switch ABx to Zosyn/fluconazole OR if worse or not improved  Adin Hector, M.D., F.A.C.S. Gastrointestinal and Minimally Invasive Surgery [facility] [address] [place], [place] [address] [phone] Main / Paging

## 2012-06-27 LAB — CBC
HCT: 29 % — ABNORMAL LOW (ref 36.0–46.0)
Hemoglobin: 10 g/dL — ABNORMAL LOW (ref 12.0–15.0)
MCHC: 34.5 g/dL (ref 30.0–36.0)
RBC: 3.52 MIL/uL — ABNORMAL LOW (ref 3.87–5.11)

## 2012-06-27 LAB — SURGICAL PCR SCREEN
MRSA, PCR: POSITIVE — AB
Staphylococcus aureus: POSITIVE — AB

## 2012-06-27 LAB — CREATININE, SERUM
GFR calc Af Amer: >90 mL/min (ref >90)
GFR calc non Af Amer: >90 mL/min (ref >90)

## 2012-06-27 MED ORDER — CLONAZEPAM 0.5 MG PO TABS
0.5000 mg | ORAL_TABLET | Freq: Two times a day (BID) | ORAL | Status: DC | PRN
  Administered 2012-06-27: 0.5 mg via ORAL
  Filled 2012-06-27: qty 1

## 2012-06-27 MED ORDER — PEG 3350-KCL-NA BICARB-NACL 420 G PO SOLR
4000.0000 mL | Freq: Once | ORAL | Status: AC
  Administered 2012-06-27: 4000 mL via ORAL
  Filled 2012-06-27: qty 4000

## 2012-06-27 MED ORDER — CHLORHEXIDINE GLUCONATE CLOTH 2 % EX PADS
6.0000 | MEDICATED_PAD | Freq: Every day | CUTANEOUS | Status: DC
  Administered 2012-06-28: 6 via TOPICAL

## 2012-06-27 MED ORDER — MUPIROCIN 2 % EX OINT
1.0000 "application " | TOPICAL_OINTMENT | Freq: Two times a day (BID) | CUTANEOUS | Status: DC
  Administered 2012-06-27: 1 via NASAL
  Filled 2012-06-27: qty 22

## 2012-06-27 NOTE — Progress Notes (Signed)
Patient ID: Sheri Calderon, female   DOB: 1966-02-03, 47 y.o.   MRN: [id number]   I had a long discussion with the patient and her family.  She is improving minimally.  Will start bowel prep today and plan partial colectomy with possible colostomy tomorrow.  I discussed this with her in detail.  I discussed the risks which include but are not limited to bleeding, infection, need for ostomy, injury to surrounding structures, etc.

## 2012-06-27 NOTE — Progress Notes (Signed)
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 Placed on orange precautions, and protocol treatments ordered. Pt completed her bowel prep. Pt reports that her BMs are clear liquid.

## 2012-06-27 NOTE — Progress Notes (Signed)
Patient ID: Sheri Calderon, female   DOB: 26-Jun-1965, 47 y.o.   MRN: [id number]    Subjective: Pt reports pain a little better but still present, had diarrhea last night.  Frustrated at situation and hungry  Objective: Vital signs in last 24 hours: Temp:  [98.1 F (36.7 C)-98.2 F (36.8 C)] 98.2 F (36.8 C) (04/28 0500) Pulse Rate:  [66-73] 66 (04/28 0500) Resp:  [16-18] 16 (04/28 0500) BP: (146-155)/(88-96) 146/89 mmHg (04/28 0500) SpO2:  [92 %-96 %] 92 % (04/28 0500) Last BM Date: 06/26/12  Intake/Output from previous day: 04/27 0701 - 04/28 0700 In: 4877.1 [P.O.:100; I.V.:4127.1; IV Piggyback:650] Out: -  Intake/Output this shift:    PE: Abd: tender in left lower quad and suprapubic area, +bs, soft otherwise General: awake, alert, NAd  Lab Results:   Recent Labs  06/26/12 0651 06/27/12 0606  WBC 7.7 6.1  HGB 10.5* 10.0*  HCT 29.9* 29.0*  PLT 380 350   BMET  Recent Labs  06/26/12 0651 06/27/12 0100  NA 139  --   K 2.9*  --   CL 104  --   CO2 26  --   GLUCOSE 103*  --   BUN <3*  --   CREATININE 0.66 0.77  CALCIUM 8.1*  --    PT/INR No results found for this basename: LABPROT, INR,  in the last 72 hours CMP     Component Value Date/Time   NA 139 06/26/2012 0651   K 2.9* 06/26/2012 0651   CL 104 06/26/2012 0651   CO2 26 06/26/2012 0651   GLUCOSE 103* 06/26/2012 0651   BUN <3* 06/26/2012 0651   CREATININE 0.77 06/27/2012 0100   CALCIUM 8.1* 06/26/2012 0651   PROT 7.3 06/07/2012 1938   ALBUMIN 3.5 06/07/2012 1938   AST 10 06/07/2012 1938   ALT 9 06/07/2012 1938   ALKPHOS 63 06/07/2012 1938   BILITOT 0.3 06/07/2012 1938   GFRNONAA >90 06/27/2012 0100   GFRAA >90 06/27/2012 0100   Lipase     Component Value Date/Time   LIPASE 21 06/07/2012 1938       Studies/Results: Ct Abdomen Pelvis W Contrast  06/26/2012  *RADIOLOGY REPORT*  Clinical Data: Evaluate known diverticular abscess  CT ABDOMEN AND PELVIS WITH CONTRAST  Technique:  Multidetector CT imaging of the  abdomen and pelvis was performed following the standard protocol during bolus administration of intravenous contrast.  Contrast:  100 ml Omnipaque-300  Comparison: CT abdomen pelvis - 06/21/2012; 06/08/2012  Findings:  There has been suspected improvement in previously noted diverticular abscess adjacent to the anterior distal sigmoid colon with extraluminal fluid collection now measuring approximately 3.6 x 1.9 cm (previously, 3.7 x 2.7 cm and no longer containing extraluminal air.  There is grossly unchanged moderate length wall thickening involving the adjacent sigmoid colon without evidence of obstruction.  Enteric contrast extends beyond this location to the level of the rectum.  There is no definitive extraluminal enteric contrast.  Persistent stranding centered primarily within the left hemi pelvis and lower abdomen.  Minimal amount of fluid within the caudal aspect of the right pericolic gutter.  No pneumoperitoneum, pneumatosis or portal venous gas.  The bowel is otherwise normal in course and caliber, again without evidence of obstruction.  Grossly unchanged appearance of approximately 3.2 x 2.9 cm left adnexal cyst (image 74, series 2).  A small amount of fluid is seen within endometrial canal, presumably physiologic.  Possible Nabothian cyst.  No discrete right-sided adnexal lesions.  Normal  hepatic contour.  Minimal amount of focal fatty infiltration adjacent to the fissure for the ligamentum teres.  Query very minimal intrahepatic biliary ductal dilatation, the etiology of which is not detected on this examination.  Normal appearance of the gallbladder.  No ascites.  There is symmetric enhancement and excretion of the bilateral kidneys.  Multiple parapelvic cysts are seen bilaterally, right greater than left.  No evidence of urinary obstruction or perinephric stranding.  Normal appearance of the bilateral adrenal glands, pancreas and spleen.  Normal caliber of the abdominal aorta.  The major branch  vessels of the abdominal aorta appear patent on this non CT examination. Incidental note is made of a retroaortic left sided renal vein.  No retroperitoneal, mesenteric, pelvic or inguinal lymphadenopathy.  Limited visualization of the lower thorax demonstrates minimal subsegmental atelectasis within the left lower lobe, lingula and right middle lobe.  No focal airspace opacities.  No pleural effusion.  Normal heart size.  No pericardial effusion.  No acute or aggressive osseous abnormalities.  Facet degenerative change bilaterally at L4 - L5 and L5 - S1.  IMPRESSION: 1.  Interval decrease in size of small diverticular abscess located within the pelvis anterior to the sigmoid colon. Also of note, the abscess no longer contains extraluminal air. 2.  Persistent wall thickening within and moderate length of adjacent sigmoid colon, not resulting in obstruction.  There is no definitive extraluminal enteric contrast identified in this examination.   Original Report Authenticated By: Jake Seats, MD     Anti-infectives: Anti-infectives   Start     Dose/Rate Route Frequency Ordered Stop   06/27/12 1300  fluconazole (DIFLUCAN) IVPB 200 mg     200 mg 100 mL/hr over 60 Minutes Intravenous Every 24 hours 06/26/12 1156     06/26/12 1400  piperacillin-tazobactam (ZOSYN) IVPB 3.375 g     3.375 g 12.5 mL/hr over 240 Minutes Intravenous Every 8 hours 06/26/12 1204     06/26/12 1300  fluconazole (DIFLUCAN) IVPB 400 mg     400 mg 100 mL/hr over 120 Minutes Intravenous  Once 06/26/12 1156 06/26/12 1541   06/23/12 0900  ertapenem (INVANZ) 1 g in sodium chloride 0.9 % 50 mL IVPB  Status:  Discontinued     1 g 100 mL/hr over 30 Minutes Intravenous Every 24 hours 06/23/12 0829 06/26/12 1158   06/21/12 2000  ciprofloxacin (CIPRO) IVPB 400 mg  Status:  Discontinued     400 mg 200 mL/hr over 60 Minutes Intravenous Every 12 hours 06/21/12 1803 06/23/12 0829   06/21/12 2000  metroNIDAZOLE (FLAGYL) IVPB 500 mg  Status:   Discontinued     500 mg 100 mL/hr over 60 Minutes Intravenous Every 8 hours 06/21/12 1803 06/23/12 0829       Assessment/Plan 1. Perf Diverticulitis with abscess and Persistent wall thickening within and moderate length of adjacent sigmoid colon: abscess smaller on CT yesterday but strictured area in colon seen, pt needs colectomy on this admission but timing and procedure will need to be determined by Dr. Ninfa Linden, he will see and talk to pt today.  Hypokalemia yesterday was replaced, keep on zosyn/flagyl for now, IVF, NPO   LOS: 6 days    Sheri Calderon 06/27/2012

## 2012-06-27 NOTE — Progress Notes (Signed)
I have seen and examined the patient and agree with the assessment and plans. Plan bowel prep today and OR tomorrow  Nathaneil Canary A. Ninfa Linden  MD, FACS

## 2012-06-28 ENCOUNTER — Inpatient Hospital Stay (HOSPITAL_COMMUNITY): Payer: BC Managed Care – PPO | Admitting: Anesthesiology

## 2012-06-28 ENCOUNTER — Encounter (HOSPITAL_COMMUNITY): Payer: Self-pay | Admitting: Certified Registered"

## 2012-06-28 ENCOUNTER — Encounter (HOSPITAL_COMMUNITY): Admission: AD | Disposition: A | Discharge status: Home / Self Care | Payer: Self-pay | Source: Ambulatory Visit

## 2012-06-28 ENCOUNTER — Encounter (HOSPITAL_COMMUNITY): Payer: Self-pay | Admitting: Anesthesiology

## 2012-06-28 DIAGNOSIS — C189 Malignant neoplasm of colon, unspecified: Secondary | ICD-10-CM

## 2012-06-28 HISTORY — PX: PARTIAL COLECTOMY: SHX5273

## 2012-06-28 SURGERY — COLECTOMY, PARTIAL
Anesthesia: General | Site: Abdomen | Wound class: Clean Contaminated

## 2012-06-28 MED ORDER — HYDROMORPHONE HCL PF 1 MG/ML IJ SOLN: 0.2500 mg | INTRAMUSCULAR | Status: DC | PRN

## 2012-06-28 MED ORDER — CHLORHEXIDINE GLUCONATE 0.12 % MT SOLN
15.0000 mL | Freq: Two times a day (BID) | OROMUCOSAL | Status: DC
  Administered 2012-06-28 – 2012-07-03 (×8): 15 mL via OROMUCOSAL
  Filled 2012-06-28 (×5): qty 15

## 2012-06-28 MED ORDER — HYDROMORPHONE HCL PF 1 MG/ML IJ SOLN
INTRAMUSCULAR | Status: AC
  Filled 2012-06-28: qty 1

## 2012-06-28 MED ORDER — ONDANSETRON HCL 4 MG PO TABS: 4.0000 mg | ORAL_TABLET | Freq: Four times a day (QID) | ORAL | Status: DC | PRN

## 2012-06-28 MED ORDER — POTASSIUM CHLORIDE IN NACL 20-0.9 MEQ/L-% IV SOLN
INTRAVENOUS | Status: DC
  Administered 2012-06-28 – 2012-07-01 (×10): via INTRAVENOUS
  Filled 2012-06-28 (×17): qty 1000

## 2012-06-28 MED ORDER — CHLORHEXIDINE GLUCONATE CLOTH 2 % EX PADS
6.0000 | MEDICATED_PAD | Freq: Every day | CUTANEOUS | Status: AC
  Administered 2012-06-29 – 2012-07-03 (×5): 6 via TOPICAL

## 2012-06-28 MED ORDER — PROPOFOL 10 MG/ML IV BOLUS
INTRAVENOUS | Status: DC | PRN
  Administered 2012-06-28: 50 mg via INTRAVENOUS
  Administered 2012-06-28: 150 mg via INTRAVENOUS

## 2012-06-28 MED ORDER — SODIUM CHLORIDE 0.9 % IJ SOLN: 9.0000 mL | INTRAMUSCULAR | Status: DC | PRN

## 2012-06-28 MED ORDER — BIOTENE DRY MOUTH MT LIQD
15.0000 mL | Freq: Two times a day (BID) | OROMUCOSAL | Status: DC
  Administered 2012-06-29 – 2012-07-02 (×4): 15 mL via OROMUCOSAL

## 2012-06-28 MED ORDER — OXYCODONE HCL 5 MG PO TABS: 5.0000 mg | ORAL_TABLET | Freq: Once | ORAL | Status: DC | PRN

## 2012-06-28 MED ORDER — LACTATED RINGERS IV SOLN
INTRAVENOUS | Status: DC | PRN
  Administered 2012-06-28 (×3): via INTRAVENOUS

## 2012-06-28 MED ORDER — DIPHENHYDRAMINE HCL 50 MG/ML IJ SOLN: 12.5000 mg | Freq: Four times a day (QID) | INTRAMUSCULAR | Status: DC | PRN

## 2012-06-28 MED ORDER — FENTANYL CITRATE 0.05 MG/ML IJ SOLN
INTRAMUSCULAR | Status: DC | PRN
  Administered 2012-06-28: 100 ug via INTRAVENOUS
  Administered 2012-06-28: 50 ug via INTRAVENOUS
  Administered 2012-06-28 (×3): 100 ug via INTRAVENOUS
  Administered 2012-06-28: 150 ug via INTRAVENOUS
  Administered 2012-06-28: 100 ug via INTRAVENOUS
  Administered 2012-06-28: 50 ug via INTRAVENOUS

## 2012-06-28 MED ORDER — CHLORHEXIDINE GLUCONATE 0.12 % MT SOLN
OROMUCOSAL | Status: AC
  Administered 2012-06-28: 23:00:00
  Filled 2012-06-28: qty 15

## 2012-06-28 MED ORDER — NEOSTIGMINE METHYLSULFATE 1 MG/ML IJ SOLN
INTRAMUSCULAR | Status: DC | PRN
  Administered 2012-06-28: 4 mg via INTRAVENOUS

## 2012-06-28 MED ORDER — NALOXONE HCL 0.4 MG/ML IJ SOLN: 0.4000 mg | INTRAMUSCULAR | Status: DC | PRN

## 2012-06-28 MED ORDER — ARTIFICIAL TEARS OP OINT
TOPICAL_OINTMENT | OPHTHALMIC | Status: DC | PRN
  Administered 2012-06-28: 1 via OPHTHALMIC

## 2012-06-28 MED ORDER — MIDAZOLAM HCL 5 MG/5ML IJ SOLN
INTRAMUSCULAR | Status: DC | PRN
  Administered 2012-06-28: 2 mg via INTRAVENOUS

## 2012-06-28 MED ORDER — MEPERIDINE HCL 25 MG/ML IJ SOLN: 6.2500 mg | INTRAMUSCULAR | Status: DC | PRN

## 2012-06-28 MED ORDER — HYDROMORPHONE 0.3 MG/ML IV SOLN
INTRAVENOUS | Status: DC
  Administered 2012-06-28: 1.8 mg via INTRAVENOUS
  Administered 2012-06-28: 0.9 mg via INTRAVENOUS
  Administered 2012-06-28: 2 mg via INTRAVENOUS
  Administered 2012-06-28: 0.3 mg via INTRAVENOUS
  Administered 2012-06-29: 1.2 mg via INTRAVENOUS
  Administered 2012-06-29: 1.8 mg via INTRAVENOUS
  Administered 2012-06-29: 0.9 mg via INTRAVENOUS
  Administered 2012-06-29: 2.1 mg via INTRAVENOUS
  Filled 2012-06-28: qty 25

## 2012-06-28 MED ORDER — ONDANSETRON HCL 4 MG/2ML IJ SOLN
INTRAMUSCULAR | Status: DC | PRN
  Administered 2012-06-28: 4 mg via INTRAVENOUS

## 2012-06-28 MED ORDER — MUPIROCIN 2 % EX OINT
1.0000 "application " | TOPICAL_OINTMENT | Freq: Two times a day (BID) | CUTANEOUS | Status: AC
  Administered 2012-06-28 – 2012-07-03 (×10): 1 via NASAL
  Filled 2012-06-28: qty 22

## 2012-06-28 MED ORDER — SODIUM CHLORIDE 0.9 % IR SOLN
Status: DC | PRN
  Administered 2012-06-28 (×3): 1

## 2012-06-28 MED ORDER — GLYCOPYRROLATE 0.2 MG/ML IJ SOLN
INTRAMUSCULAR | Status: DC | PRN
  Administered 2012-06-28: 0.6 mg via INTRAVENOUS

## 2012-06-28 MED ORDER — HYDROMORPHONE HCL PF 1 MG/ML IJ SOLN
INTRAMUSCULAR | Status: AC
  Administered 2012-06-28 (×2): 0.5 mg
  Filled 2012-06-28: qty 1

## 2012-06-28 MED ORDER — LIDOCAINE HCL (CARDIAC) 20 MG/ML IV SOLN
INTRAVENOUS | Status: DC | PRN
  Administered 2012-06-28: 100 mg via INTRAVENOUS

## 2012-06-28 MED ORDER — ONDANSETRON HCL 4 MG/2ML IJ SOLN: 4.0000 mg | Freq: Once | INTRAMUSCULAR | Status: DC | PRN

## 2012-06-28 MED ORDER — ENOXAPARIN SODIUM 40 MG/0.4ML ~~LOC~~ SOLN
40.0000 mg | SUBCUTANEOUS | Status: DC
  Administered 2012-06-29 – 2012-07-04 (×6): 40 mg via SUBCUTANEOUS
  Filled 2012-06-28 (×8): qty 0.4

## 2012-06-28 MED ORDER — ONDANSETRON HCL 4 MG/2ML IJ SOLN
4.0000 mg | Freq: Four times a day (QID) | INTRAMUSCULAR | Status: DC | PRN
  Administered 2012-06-29 – 2012-07-02 (×3): 4 mg via INTRAVENOUS
  Filled 2012-06-28 (×2): qty 2

## 2012-06-28 MED ORDER — DIPHENHYDRAMINE HCL 12.5 MG/5ML PO ELIX: 12.5000 mg | ORAL_SOLUTION | Freq: Four times a day (QID) | ORAL | Status: DC | PRN

## 2012-06-28 MED ORDER — KETOROLAC TROMETHAMINE 30 MG/ML IJ SOLN
30.0000 mg | Freq: Three times a day (TID) | INTRAMUSCULAR | Status: AC | PRN
  Administered 2012-06-28 – 2012-06-29 (×3): 30 mg via INTRAVENOUS
  Filled 2012-06-28 (×3): qty 1

## 2012-06-28 MED ORDER — OXYCODONE HCL 5 MG/5ML PO SOLN: 5.0000 mg | Freq: Once | ORAL | Status: DC | PRN

## 2012-06-28 MED ORDER — ROCURONIUM BROMIDE 100 MG/10ML IV SOLN
INTRAVENOUS | Status: DC | PRN
  Administered 2012-06-28: 10 mg via INTRAVENOUS
  Administered 2012-06-28: 50 mg via INTRAVENOUS
  Administered 2012-06-28: 20 mg via INTRAVENOUS
  Administered 2012-06-28: 10 mg via INTRAVENOUS

## 2012-06-28 MED ORDER — PIPERACILLIN-TAZOBACTAM 3.375 G IVPB
3.3750 g | Freq: Three times a day (TID) | INTRAVENOUS | Status: DC
  Administered 2012-06-28 – 2012-07-03 (×16): 3.375 g via INTRAVENOUS
  Filled 2012-06-28 (×20): qty 50

## 2012-06-28 MED ORDER — HYDROMORPHONE 0.3 MG/ML IV SOLN
INTRAVENOUS | Status: AC
  Filled 2012-06-28: qty 25

## 2012-06-28 MED ORDER — HYDROMORPHONE HCL PF 1 MG/ML IJ SOLN
0.2500 mg | INTRAMUSCULAR | Status: DC | PRN
  Administered 2012-06-28: 0.5 mg via INTRAVENOUS

## 2012-06-28 MED ORDER — ONDANSETRON HCL 4 MG/2ML IJ SOLN: 4.0000 mg | Freq: Four times a day (QID) | INTRAMUSCULAR | Status: DC | PRN

## 2012-06-28 SURGICAL SUPPLY — 66 items
BLADE SURG ROTATE 9660 (MISCELLANEOUS) IMPLANT
CANISTER SUCTION 2500CC (MISCELLANEOUS) ×2 IMPLANT
CHLORAPREP W/TINT 26ML (MISCELLANEOUS) ×2 IMPLANT
CLOTH BEACON ORANGE TIMEOUT ST (SAFETY) ×2 IMPLANT
COVER MAYO STAND STRL (DRAPES) ×2 IMPLANT
COVER SURGICAL LIGHT HANDLE (MISCELLANEOUS) ×3 IMPLANT
DRAPE INCISE IOBAN 66X45 STRL (DRAPES) IMPLANT
DRAPE LAPAROSCOPIC ABDOMINAL (DRAPES) ×2 IMPLANT
DRAPE PROXIMA HALF (DRAPES) ×1 IMPLANT
DRAPE UTILITY 15X26 W/TAPE STR (DRAPE) ×6 IMPLANT
DRAPE WARM FLUID 44X44 (DRAPE) ×2 IMPLANT
DRSG OPSITE POSTOP 4X8 (GAUZE/BANDAGES/DRESSINGS) ×1 IMPLANT
ELECT BLADE 6.5 EXT (BLADE) ×1 IMPLANT
ELECT CAUTERY BLADE 6.4 (BLADE) ×4 IMPLANT
ELECT REM PT RETURN 9FT ADLT (ELECTROSURGICAL) ×2
ELECTRODE REM PT RTRN 9FT ADLT (ELECTROSURGICAL) ×1 IMPLANT
GLOVE BIO SURGEON STRL SZ7.5 (GLOVE) ×1 IMPLANT
GLOVE BIOGEL PI IND STRL 6 (GLOVE) IMPLANT
GLOVE BIOGEL PI IND STRL 6.5 (GLOVE) IMPLANT
GLOVE BIOGEL PI IND STRL 7.0 (GLOVE) IMPLANT
GLOVE BIOGEL PI IND STRL 7.5 (GLOVE) IMPLANT
GLOVE BIOGEL PI INDICATOR 6 (GLOVE) ×1
GLOVE BIOGEL PI INDICATOR 6.5 (GLOVE) ×1
GLOVE BIOGEL PI INDICATOR 7.0 (GLOVE) ×3
GLOVE BIOGEL PI INDICATOR 7.5 (GLOVE) ×3
GLOVE ECLIPSE 7.5 STRL STRAW (GLOVE) ×2 IMPLANT
GLOVE SS BIOGEL STRL SZ 7 (GLOVE) IMPLANT
GLOVE SUPERSENSE BIOGEL SZ 7 (GLOVE) ×1
GLOVE SURG SIGNA 7.5 PF LTX (GLOVE) ×7 IMPLANT
GOWN BRE IMP PREV XXLGXLNG (GOWN DISPOSABLE) ×2 IMPLANT
GOWN ISOLATION IMPERV (GOWNS) ×1 IMPLANT
GOWN PREVENTION PLUS XLARGE (GOWN DISPOSABLE) ×4 IMPLANT
GOWN STRL NON-REIN LRG LVL3 (GOWN DISPOSABLE) ×12 IMPLANT
KIT BASIN OR (CUSTOM PROCEDURE TRAY) ×2 IMPLANT
KIT ROOM TURNOVER OR (KITS) ×2 IMPLANT
LEGGING LITHOTOMY PAIR STRL (DRAPES) IMPLANT
LIGASURE IMPACT 36 18CM CVD LR (INSTRUMENTS) ×2 IMPLANT
NS IRRIG 1000ML POUR BTL (IV SOLUTION) ×4 IMPLANT
PACK GENERAL/GYN (CUSTOM PROCEDURE TRAY) ×2 IMPLANT
PAD ARMBOARD 7.5X6 YLW CONV (MISCELLANEOUS) ×2 IMPLANT
PAD SHARPS MAGNETIC DISPOSAL (MISCELLANEOUS) ×2 IMPLANT
RELOAD PROXIMATE 75MM BLUE (ENDOMECHANICALS) ×2 IMPLANT
RELOAD STAPLE 75 3.8 BLU REG (ENDOMECHANICALS) IMPLANT
SPECIMEN JAR LARGE (MISCELLANEOUS) ×1 IMPLANT
SPONGE LAP 18X18 X RAY DECT (DISPOSABLE) ×3 IMPLANT
STAPLER CIRC CVD 29MM 37CM (STAPLE) ×1 IMPLANT
STAPLER CUT CVD 40MM BLUE (STAPLE) ×1 IMPLANT
STAPLER PROXIMATE 75MM BLUE (STAPLE) ×1 IMPLANT
STAPLER VISISTAT 35W (STAPLE) ×2 IMPLANT
SUCTION POOLE TIP (SUCTIONS) ×2 IMPLANT
SURGILUBE 2OZ TUBE FLIPTOP (MISCELLANEOUS) ×1 IMPLANT
SUT PDS AB 1 TP1 96 (SUTURE) ×2 IMPLANT
SUT PROLENE 2 0 CT2 30 (SUTURE) ×1 IMPLANT
SUT PROLENE 2 0 KS (SUTURE) IMPLANT
SUT SILK 2 0 SH CR/8 (SUTURE) ×3 IMPLANT
SUT SILK 2 0 TIES 10X30 (SUTURE) ×2 IMPLANT
SUT SILK 3 0 SH CR/8 (SUTURE) ×3 IMPLANT
SUT SILK 3 0 TIES 10X30 (SUTURE) ×2 IMPLANT
SUT VIC AB 3-0 SH 18 (SUTURE) IMPLANT
SYR BULB IRRIGATION 50ML (SYRINGE) ×2 IMPLANT
TOWEL OR 17X26 10 PK STRL BLUE (TOWEL DISPOSABLE) ×4 IMPLANT
TRAY FOLEY CATH 14FRSI W/METER (CATHETERS) ×2 IMPLANT
TRAY PROCTOSCOPIC FIBER OPTIC (SET/KITS/TRAYS/PACK) IMPLANT
UNDERPAD 30X30 INCONTINENT (UNDERPADS AND DIAPERS) IMPLANT
WATER STERILE IRR 1000ML POUR (IV SOLUTION) IMPLANT
YANKAUER SUCT BULB TIP NO VENT (SUCTIONS) ×4 IMPLANT

## 2012-06-28 NOTE — Op Note (Signed)
PARTIAL COLECTOMY  Procedure Note  TOREE EDLING 06/21/2012 - 06/28/2012   Pre-op Diagnosis: Diverticulitis with abscess     Post-op Diagnosis: same  Procedure(s): PARTIAL COLECTOMY  Surgeon(s): Harl Bowie, MD  Anesthesia: General  Staff:  Circulator: Luberta Mutter, RN; Cyd Silence, RN Scrub Person: Delrae Alfred, [place]; Cyd Silence, RN  Estimated Blood Loss: Minimal               Specimens: sent to path          [facility] A   Date: 06/28/2012  Time: 9:34 AM

## 2012-06-28 NOTE — Anesthesia Preprocedure Evaluation (Addendum)
Anesthesia Evaluation  Patient identified by MRN, date of birth, ID band Patient awake    Reviewed: Allergy & Precautions, H&P , NPO status , Patient's Chart, lab work & pertinent test results  Airway Mallampati: I TM Distance: >3 FB Neck ROM: Full    Dental  (+) Teeth Intact, Poor Dentition and Chipped,    Pulmonary          Cardiovascular + dysrhythmias Atrial Fibrillation Rhythm:Regular Rate:Normal     Neuro/Psych    GI/Hepatic   Endo/Other    Renal/GU      Musculoskeletal   Abdominal   Peds  Hematology   Anesthesia Other Findings   Reproductive/Obstetrics                          Anesthesia Physical Anesthesia Plan  ASA: III  Anesthesia Plan: General   Post-op Pain Management:    Induction: Intravenous  Airway Management Planned: Oral ETT  Additional Equipment:   Intra-op Plan:   Post-operative Plan: Extubation in OR  Informed Consent: I have reviewed the patients History and Physical, chart, labs and discussed the procedure including the risks, benefits and alternatives for the proposed anesthesia with the patient or authorized representative who has indicated his/her understanding and acceptance.     Plan Discussed with: CRNA and Surgeon  Anesthesia Plan Comments:         Anesthesia Quick Evaluation

## 2012-06-28 NOTE — Anesthesia Procedure Notes (Signed)
Procedure Name: Intubation Date/Time: 06/28/2012 7:44 AM Performed by: Sampson Si E Pre-anesthesia Checklist: Patient identified, Timeout performed, Emergency Drugs available, Suction available and Patient being monitored Patient Re-evaluated:Patient Re-evaluated prior to inductionOxygen Delivery Method: Circle system utilized Preoxygenation: Pre-oxygenation with 100% oxygen Intubation Type: IV induction Ventilation: Mask ventilation without difficulty Laryngoscope Size: Mac and 4 Grade View: Grade I Tube type: Oral Tube size: 7.0 mm Number of attempts: 1 Airway Equipment and Method: Stylet Placement Confirmation: ETT inserted through vocal cords under direct vision,  breath sounds checked- equal and bilateral and positive ETCO2 Secured at: 21 cm Tube secured with: Tape Dental Injury: Teeth and Oropharynx as per pre-operative assessment

## 2012-06-28 NOTE — Preoperative (Signed)
Beta Blockers   Reason not to administer Beta Blockers:Not Applicable 

## 2012-06-28 NOTE — Transfer of Care (Signed)
Immediate Anesthesia Transfer of Care Note  Patient: Sheri Calderon  Procedure(s) Performed: Procedure(s): PARTIAL COLECTOMY (N/A)  Patient Location: PACU  Anesthesia Type:General  Level of Consciousness: oriented, lethargic and responds to stimulation  Airway & Oxygen Therapy: Patient Spontanous Breathing and Patient connected to nasal cannula oxygen  Post-op Assessment: Report given to PACU RN  Post vital signs: Reviewed and stable  Complications: No apparent anesthesia complications

## 2012-06-28 NOTE — Progress Notes (Addendum)
ANTIBIOTIC CONSULT NOTE - INITIAL  Pharmacy Consult for Zosyn Indication: diverticulitis with abscess  No Known Allergies  Patient Measurements: Height: 5' 7"  (170.2 cm) Weight: 184 lb (83.462 kg) IBW/kg (Calculated) : 61.6   Vital Signs: Temp: 97.7 F (36.5 C) (04/29 1234) Temp src: Oral (04/29 1234) BP: 136/83 mmHg (04/29 1234) Pulse Rate: 84 (04/29 1234) Intake/Output from previous day: 04/28 0701 - 04/29 0700 In: 1875 [I.V.:1875] Out: -  Intake/Output from this shift: Total I/O In: 2250 [I.V.:2250] Out: 925 [Urine:775; Blood:150]  Labs:  Recent Labs  06/26/12 0651 06/27/12 0100 06/27/12 0606  WBC 7.7  --  6.1  HGB 10.5*  --  10.0*  PLT 380  --  350  CREATININE 0.66 0.77  --    Estimated Creatinine Clearance: 96.6 ml/min (by C-G formula based on Cr of 0.77). No results found for this basename: VANCOTROUGH, Corlis Leak, VANCORANDOM, [place], GENTPEAK, [place], [place], TOBRAPEAK, TOBRARND, AMIKACINPEAK, AMIKACINTROU, AMIKACIN,  in the last 72 hours   Microbiology: Recent Results (from the past 720 hour(s))  URINE CULTURE     Status: None   Collection Time    06/08/12  1:00 AM      Result Value Range Status   Specimen Description URINE, CLEAN CATCH   Final   Special Requests NONE   Final   Culture  Setup Time 06/08/2012 01:28   Final   Colony Count 40,000 COLONIES/ML   Final   Culture     Final   Value: Multiple bacterial morphotypes present, none predominant. Suggest appropriate recollection if clinically indicated.   Report Status 06/09/2012 FINAL   Final  SURGICAL PCR SCREEN     Status: Abnormal   Collection Time    06/27/12  2:46 PM      Result Value Range Status   MRSA, PCR POSITIVE (*) NEGATIVE Final   Staphylococcus aureus POSITIVE (*) NEGATIVE Final   Comment:            The Xpert SA Assay (FDA     approved for NASAL specimens     in patients over 36 years of age),     is one component of     a comprehensive surveillance   program.  Test performance has     been validated by [employer] for patients greater     than or equal to 2 year old.     It is not intended     to diagnose infection nor to     guide or monitor treatment.    Assessment: 47 yo female presents with acute diverticulitis with abscess. Pt on Day #3 of Zosyn (Day #8 total abx - Cipro/Flagyl -> Ertapenem -> Zosyn). CrCl remains stable. Zosyn to continue post-op. S/p OR today for partial colectomy.  Goal of Therapy: Eradication of infection   Plan:  1) Continue Zosyn 3.375g iv q8h (4h infusion).  2) As CrCl seems to be stable, pharmacy will sign off - please reconsult if needed.  Sherlon Handing, PharmD, BCPS Clinical pharmacist, pager [phone] 06/28/2012,12:50 PM

## 2012-06-28 NOTE — Anesthesia Postprocedure Evaluation (Signed)
Anesthesia Post Note  Patient: Sheri Calderon  Procedure(s) Performed: Procedure(s) (LRB): PARTIAL COLECTOMY (N/A)  Anesthesia type: general  Patient location: PACU  Post pain: Pain level controlled  Post assessment: Patient's Cardiovascular Status Stable  Last Vitals:  Filed Vitals:   06/28/12 1017  BP: 148/91  Pulse: 83  Temp:   Resp: 17    Post vital signs: Reviewed and stable  Level of consciousness: sedated  Complications: No apparent anesthesia complications

## 2012-06-29 ENCOUNTER — Encounter (HOSPITAL_COMMUNITY): Payer: Self-pay | Admitting: Surgery

## 2012-06-29 LAB — CBC
HCT: 31.5 % — ABNORMAL LOW (ref 36.0–46.0)
Hemoglobin: 10.6 g/dL — ABNORMAL LOW (ref 12.0–15.0)
MCV: 82.9 fL (ref 78.0–100.0)
RBC: 3.8 MIL/uL — ABNORMAL LOW (ref 3.87–5.11)
RDW: 12.7 % (ref 11.5–15.5)
WBC: 8.6 10*3/uL (ref 4.0–10.5)

## 2012-06-29 LAB — BASIC METABOLIC PANEL
BUN: <3 mg/dL — ABNORMAL LOW (ref 6–23)
CO2: 26 mEq/L (ref 19–32)
Chloride: 104 mEq/L (ref 96–112)
Creatinine, Ser: 0.75 mg/dL (ref 0.50–1.10)
Glucose, Bld: 77 mg/dL (ref 70–99)
Potassium: 3.5 mEq/L (ref 3.5–5.1)

## 2012-06-29 MED ORDER — ONDANSETRON HCL 4 MG/2ML IJ SOLN
4.0000 mg | Freq: Four times a day (QID) | INTRAMUSCULAR | Status: DC | PRN
  Filled 2012-06-29: qty 2

## 2012-06-29 MED ORDER — SODIUM CHLORIDE 0.9 % IJ SOLN: 9.0000 mL | INTRAMUSCULAR | Status: DC | PRN

## 2012-06-29 MED ORDER — DIPHENHYDRAMINE HCL 12.5 MG/5ML PO ELIX: 12.5000 mg | ORAL_SOLUTION | Freq: Four times a day (QID) | ORAL | Status: DC | PRN

## 2012-06-29 MED ORDER — BLISTEX EX OINT
TOPICAL_OINTMENT | CUTANEOUS | Status: DC | PRN
  Filled 2012-06-29: qty 10

## 2012-06-29 MED ORDER — LIP MEDEX EX OINT
TOPICAL_OINTMENT | CUTANEOUS | Status: DC | PRN
Start: 2012-06-29 — End: 2012-06-29
  Filled 2012-06-29: qty 7

## 2012-06-29 MED ORDER — HYDROMORPHONE 0.3 MG/ML IV SOLN
INTRAVENOUS | Status: DC
  Administered 2012-06-29: 17:00:00 via INTRAVENOUS
  Administered 2012-06-29: 2.4 mg via INTRAVENOUS
  Administered 2012-06-30: 1.25 mg via INTRAVENOUS
  Filled 2012-06-29: qty 25

## 2012-06-29 MED ORDER — ONDANSETRON HCL 4 MG/2ML IJ SOLN: 4.0000 mg | Freq: Four times a day (QID) | INTRAMUSCULAR | Status: DC | PRN

## 2012-06-29 MED ORDER — DIPHENHYDRAMINE HCL 50 MG/ML IJ SOLN: 12.5000 mg | Freq: Four times a day (QID) | INTRAMUSCULAR | Status: DC | PRN

## 2012-06-29 MED ORDER — NALOXONE HCL 0.4 MG/ML IJ SOLN: 0.4000 mg | INTRAMUSCULAR | Status: DC | PRN

## 2012-06-29 MED ORDER — MORPHINE SULFATE (PF) 1 MG/ML IV SOLN
INTRAVENOUS | Status: DC
  Administered 2012-06-29: 12 mg via INTRAVENOUS
  Administered 2012-06-29: 6 mg via INTRAVENOUS
  Administered 2012-06-29: 12:00:00 via INTRAVENOUS
  Filled 2012-06-29: qty 25

## 2012-06-29 MED ORDER — DIPHENHYDRAMINE HCL 50 MG/ML IJ SOLN
12.5000 mg | Freq: Four times a day (QID) | INTRAMUSCULAR | Status: DC | PRN
  Administered 2012-06-29: 12.5 mg via INTRAVENOUS
  Filled 2012-06-29: qty 1

## 2012-06-29 MED ORDER — DIPHENHYDRAMINE HCL 12.5 MG/5ML PO ELIX
12.5000 mg | ORAL_SOLUTION | Freq: Four times a day (QID) | ORAL | Status: DC | PRN
  Filled 2012-06-29: qty 10

## 2012-06-29 NOTE — Progress Notes (Signed)
I have seen and examined the patient and agree with the assessment and plans.  Cecil Vandyke A. Ninfa Linden  MD, FACS

## 2012-06-29 NOTE — Op Note (Signed)
Sheri Calderon, Sheri Calderon NO.:  [id number]  MEDICAL RECORD NO.:  [id number]  LOCATION:  [id number]                        FACILITY:  [facility]  PHYSICIAN:  Coralie Keens, M.D. DATE OF BIRTH:  1965-07-31  DATE OF PROCEDURE:  06/28/2012 DATE OF DISCHARGE:                              OPERATIVE REPORT   PREOPERATIVE DIAGNOSIS:  Diverticular abscess.  POSTOPERATIVE DIAGNOSIS:  Diverticular abscess.  PROCEDURES: 1. Partial colectomy. 2. Incidental appendectomy.  SURGEONS:  Coralie Keens, M.D.  ANESTHESIA:  General endotracheal anesthesia.  ESTIMATED BLOOD LOSS:  Minimal.  ASSISTANT:  Judyann Munson, RN.  ESTIMATED BLOOD LOSS:  Minimal.  INDICATIONS:  This is a 47 year old female who has had significant diverticulitis with abscess.  She was not able to be percutaneously drained; however, she did improve with IV antibiotics.  She was able to undergo a bowel preparation.  Decision was made to proceed to the operating room.  FINDINGS:  Patient was found to have diverticulitis without gross perforation or spillage of the abscess.  She actually had a left ovarian cyst as well.  Decision was made to proceed with a partial colectomy with reanastomosis as well as an incidental appendectomy.  PROCEDURE IN DETAIL:  The patient was brought to the operative room, identified as Sheri Calderon.  She was placed supine on the operative table and general anesthesia was induced.  Her abdomen was then prepped and draped in usual sterile fashion.  I made a lower midline incision with a #10 blade scalpel.  I took this down through the fascia with electrocautery.  The peritoneum was opened to the entire length of the incision.  Upon entering the abdomen, there was a small amount of free fluid.  The patient was found to have extensive diverticulitis involving the sigmoid colon.  She actually had a left ovarian cyst, which had the appearance of an abscess on CAT scan, but actually  just involving the ovaries as a simple cyst which was drained.  The other abscess was in the wall of the colon and was contained.  I was able to stay out of the abscess during the entire dissection.  I was able to mobilize the sigmoid colon along the white line of T[place] and the lateral pelvic sidewall taking down multiple adhesions.  I then transected the colon proximal to the area of diverticulitis with a GIA 75 stapler.  I then took down the mesentery with both the ligature and 2-0 silk ties.  I then identified the proximal rectum and transected with a contour stapler.  The sigmoid colon contained the diverticulitis with abscess. It was completely removed and sent to pathology for evaluation.  I then opened up the proximal staple line with the cautery.  I evaluated with the EEA stapler sizers and decided to proceed with a 29 EEA stapler.  I then placed a 2-0 Prolene pursestring suture in the proximal end of colon and placed the anvil from a 29 EEA stapler into the open end and sutured it down with the pursestring suture.  I then brought the other end of the EEA stapler up through the rectum and out of the distal staple line.  The stapling device was  then opened up bringing the end just anterior to the staple line.  I then connected the anvil to the stapler and slowly closed the stapling device bringing the proximal colon down to the rectum.  The stapling device was then fired creating the colorectal anastomosis.  I examined the donuts which were found to be intact.  There was some slight serosal splitting on the anterior staple lines.  I reinforced this with interrupted silk sutures.  I then placed a bowel clamp on the proximal colon.  I filled the pelvis with saline and then insufflated air into the rectum and anus through a proctoscope.  The anastomosis distended well without evidence of leak. I then release the air from the colon.  I released the bowel clamp.  I then thoroughly  irrigated the abdomen with several L of normal saline. At this point, the appendix was identified.  I took down the mesoappendix with ligature and then transected the base of the appendix with a single firing of the GIA 75 stapler.  I irrigated the abdomen further.  Hemostasis appeared to be achieved.  I then closed the patient's midline fascia peritoneum with a running #1 looped PDS suture. The skin was then irrigated and closed with skin staples.  Occlusive dressing was then applied.  The patient tolerated the procedure well. All counts were correct at the end of the procedure.  The patient was then extubated in the operating room and taken in stable condition to recovery room.     Coralie Keens, M.D.     DB/MEDQ  D:  06/28/2012  T:  06/29/2012  Job:  [id number]

## 2012-06-29 NOTE — Progress Notes (Signed)
1 Day Post-Op  Subjective: Pt states she is in significant pain, especially with movement.  She rates her pain as 9/10, even at rest.  She denies flatus but states that she heard a "rumbling" in her stomach this morning.  Her Foley was removed this morning and she has not urinated since.  Denies nausea and vomiting as well.    Objective: Vital signs in last 24 hours: Temp:  [97.7 F (36.5 C)-99.1 F (37.3 C)] 98.4 F (36.9 C) (04/30 0636) Pulse Rate:  [75-93] 86 (04/30 0636) Resp:  [12-24] 20 (04/30 0929) BP: (123-153)/(74-93) 151/93 mmHg (04/30 0636) SpO2:  [93 %-100 %] 98 % (04/30 0929) FiO2 (%):  [37 %-40 %] 40 % (04/30 0400) Weight:  [83.462 kg (184 lb)] 83.462 kg (184 lb) (04/29 1233) Last BM Date: 06/27/12  Intake/Output from previous day: 04/29 0701 - 04/30 0700 In: 3429.4 [P.O.:60; I.V.:3369.4] Out: 2575 [Urine:2425; Blood:150] Intake/Output this shift:    PE: Gen:  Alert, NAD, pleasant Card:  RRR, no M/G/R heard Pulm:  CTA, no W/R/R Abd: Soft, slightly distended, diffuse TTP throughout all 4 quadrants and appropriately around the incision, +BS, staples/incision C/D/I with no drainage noted  Lab Results:   Recent Labs  06/27/12 0606 06/29/12 0440  WBC 6.1 8.6  HGB 10.0* 10.6*  HCT 29.0* 31.5*  PLT 350 378   BMET  Recent Labs  06/27/12 0100 06/29/12 0440  NA  --  138  K  --  3.5  CL  --  104  CO2  --  26  GLUCOSE  --  77  BUN  --  <3*  CREATININE 0.77 0.75  CALCIUM  --  8.0*   PT/INR No results found for this basename: LABPROT, INR,  in the last 72 hours CMP     Component Value Date/Time   NA 138 06/29/2012 0440   K 3.5 06/29/2012 0440   CL 104 06/29/2012 0440   CO2 26 06/29/2012 0440   GLUCOSE 77 06/29/2012 0440   BUN <3* 06/29/2012 0440   CREATININE 0.75 06/29/2012 0440   CALCIUM 8.0* 06/29/2012 0440   PROT 7.3 06/07/2012 1938   ALBUMIN 3.5 06/07/2012 1938   AST 10 06/07/2012 1938   ALT 9 06/07/2012 1938   ALKPHOS 63 06/07/2012 1938   BILITOT 0.3  06/07/2012 1938   GFRNONAA >90 06/29/2012 0440   GFRAA >90 06/29/2012 0440   Lipase     Component Value Date/Time   LIPASE 21 06/07/2012 1938       Studies/Results: No results found.  Anti-infectives: Anti-infectives   Start     Dose/Rate Route Frequency Ordered Stop   06/28/12 1400  piperacillin-tazobactam (ZOSYN) IVPB 3.375 g     3.375 g 12.5 mL/hr over 240 Minutes Intravenous Every 8 hours 06/28/12 1245     06/27/12 1300  fluconazole (DIFLUCAN) IVPB 200 mg  Status:  Discontinued     200 mg 100 mL/hr over 60 Minutes Intravenous Every 24 hours 06/26/12 1156 06/28/12 1233   06/26/12 1400  piperacillin-tazobactam (ZOSYN) IVPB 3.375 g  Status:  Discontinued     3.375 g 12.5 mL/hr over 240 Minutes Intravenous Every 8 hours 06/26/12 1204 06/28/12 1233   06/26/12 1300  fluconazole (DIFLUCAN) IVPB 400 mg     400 mg 100 mL/hr over 120 Minutes Intravenous  Once 06/26/12 1156 06/26/12 1541   06/23/12 0900  ertapenem (INVANZ) 1 g in sodium chloride 0.9 % 50 mL IVPB  Status:  Discontinued     1  g 100 mL/hr over 30 Minutes Intravenous Every 24 hours 06/23/12 0829 06/26/12 1158   06/21/12 2000  ciprofloxacin (CIPRO) IVPB 400 mg  Status:  Discontinued     400 mg 200 mL/hr over 60 Minutes Intravenous Every 12 hours 06/21/12 1803 06/23/12 0829   06/21/12 2000  metroNIDAZOLE (FLAGYL) IVPB 500 mg  Status:  Discontinued     500 mg 100 mL/hr over 60 Minutes Intravenous Every 8 hours 06/21/12 1803 06/23/12 0829       Assessment/Plan 1. S/P partial colectomy, incidental appendectomy for diverticular abscess  - Cont NPO with ice chips due to lack of flatus.  Once flatus returns, can advance to clear liquid diet. - Her pain is not currently well-controlled.  Will add morphine PCA, d/c dilaudid PCA. Cont Toradol. - Cont abx therapy (Zosyn) - Cont IV fluids - Cont SCDs, lovenox for VTE prophylaxis - Order PT/OT consult for ambulation 2. Acute Blood Loss Anemia - Repeat CBC tomorrow AM   LOS: 8  days    Donah Driver 06/29/2012, 9:40 AM [phone]

## 2012-06-29 NOTE — Progress Notes (Signed)
Verified Hydromorphone PCA with Charlies Silvers.

## 2012-06-30 LAB — CBC
HCT: 30.6 % — ABNORMAL LOW (ref 36.0–46.0)
Hemoglobin: 10.1 g/dL — ABNORMAL LOW (ref 12.0–15.0)
MCH: 27.7 pg (ref 26.0–34.0)
MCHC: 33 g/dL (ref 30.0–36.0)
MCV: 84.1 fL (ref 78.0–100.0)
RDW: 12.7 % (ref 11.5–15.5)

## 2012-06-30 LAB — BASIC METABOLIC PANEL
BUN: 3 mg/dL — ABNORMAL LOW (ref 6–23)
Creatinine, Ser: 0.7 mg/dL (ref 0.50–1.10)
GFR calc Af Amer: >90 mL/min (ref >90)
GFR calc non Af Amer: >90 mL/min (ref >90)
Glucose, Bld: 84 mg/dL (ref 70–99)

## 2012-06-30 MED ORDER — LORAZEPAM 2 MG/ML IJ SOLN
1.0000 mg | Freq: Four times a day (QID) | INTRAMUSCULAR | Status: DC | PRN
  Administered 2012-07-02: 1 mg via INTRAVENOUS
  Filled 2012-06-30: qty 1

## 2012-06-30 MED ORDER — CLONAZEPAM 0.5 MG PO TABS
0.5000 mg | ORAL_TABLET | Freq: Three times a day (TID) | ORAL | Status: DC | PRN
  Administered 2012-06-30 – 2012-07-03 (×7): 0.5 mg via ORAL
  Filled 2012-06-30 (×7): qty 1

## 2012-06-30 MED ORDER — ACETAMINOPHEN 10 MG/ML IV SOLN
1000.0000 mg | Freq: Four times a day (QID) | INTRAVENOUS | Status: AC
  Administered 2012-06-30 – 2012-07-01 (×4): 1000 mg via INTRAVENOUS
  Filled 2012-06-30 (×4): qty 100

## 2012-06-30 MED ORDER — HYDROMORPHONE 0.3 MG/ML IV SOLN
INTRAVENOUS | Status: DC
  Administered 2012-06-30: 1.8 mg via INTRAVENOUS
  Administered 2012-06-30: 4 mg via INTRAVENOUS
  Administered 2012-06-30: 4.04 mg via INTRAVENOUS
  Administered 2012-06-30: 2.1 mg via INTRAVENOUS
  Administered 2012-06-30: 08:00:00 via INTRAVENOUS
  Administered 2012-07-01: 0.9 mg via INTRAVENOUS
  Administered 2012-07-01: 04:00:00 via INTRAVENOUS
  Filled 2012-06-30 (×2): qty 25

## 2012-06-30 MED ORDER — HYDROMORPHONE 0.3 MG/ML IV SOLN: INTRAVENOUS | Status: DC

## 2012-06-30 NOTE — Progress Notes (Signed)
Patient ID: Sheri Calderon, female   DOB: 02/04/1966, 47 y.o.   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 Will check a CEA level  I broke the news to the patient and her family

## 2012-06-30 NOTE — Progress Notes (Signed)
OT Cancellation Note  Patient Details Name: Sheri Calderon MRN: [id number] DOB: [birth year]   Cancelled Treatment:    Reason Eval/Treat Not Completed: Other (comment)Pt and family just received news that she has colon adenocarcinoma and is very upset. Will defer eval til tomorrow   Britt Bottom, [place] 06/30/2012, 1:06 PM

## 2012-06-30 NOTE — Progress Notes (Signed)
I have seen and examined the patient and agree with the assessment and plans. Will start liquids  Nicholas Ossa A. Ninfa Linden  MD, FACS

## 2012-06-30 NOTE — Progress Notes (Signed)
2 Days Post-Op  Subjective: Pt states she is still in significant pain today, but says it has decreased to an 8/10.  Pt describes the pain as a "soreness."  Pt states she began passing flatus last night, denies bowel movements.  Denies urinary difficulty.  Pt began ambulating in the halls yesterday as well as up to the commode to urinate.   Objective: Vital signs in last 24 hours: Temp:  [97.8 F (36.6 C)-98.4 F (36.9 C)] 98.1 F (36.7 C) (05/01 0442) Pulse Rate:  [78-88] 78 (05/01 0442) Resp:  [16-20] 16 (05/01 0750) BP: (134-151)/(72-85) 151/84 mmHg (05/01 0442) SpO2:  [96 %-99 %] 97 % (05/01 0750) FiO2 (%):  [40 %] 40 % (05/01 0455) Last BM Date: 06/27/12  Intake/Output from previous day: 04/30 0701 - 05/01 0700 In: 2995.4 [P.O.:110; I.V.:2885.4] Out: 1001 [Urine:1000; Stool:1] Intake/Output this shift:    PE: Gen:  Alert, NAD, pleasant Card:  RRR, no M/G/R heard Pulm:  CTA, no W/R/R Abd: Soft, slightly distended, diffuse TTP, but primarily tender in the LLQ and appropriately around the incision site, ND, +BS, no HSM, staples C/D/I  Lab Results:   Recent Labs  06/29/12 0440 06/30/12 0555  WBC 8.6 6.7  HGB 10.6* 10.1*  HCT 31.5* 30.6*  PLT 378 383   BMET  Recent Labs  06/29/12 0440 06/30/12 0555  NA 138 137  K 3.5 3.2*  CL 104 103  CO2 26 27  GLUCOSE 77 84  BUN <3* 3*  CREATININE 0.75 0.70  CALCIUM 8.0* 8.4   PT/INR No results found for this basename: LABPROT, INR,  in the last 72 hours CMP     Component Value Date/Time   NA 137 06/30/2012 0555   K 3.2* 06/30/2012 0555   CL 103 06/30/2012 0555   CO2 27 06/30/2012 0555   GLUCOSE 84 06/30/2012 0555   BUN 3* 06/30/2012 0555   CREATININE 0.70 06/30/2012 0555   CALCIUM 8.4 06/30/2012 0555   PROT 7.3 06/07/2012 1938   ALBUMIN 3.5 06/07/2012 1938   AST 10 06/07/2012 1938   ALT 9 06/07/2012 1938   ALKPHOS 63 06/07/2012 1938   BILITOT 0.3 06/07/2012 1938   GFRNONAA >90 06/30/2012 0555   GFRAA >90 06/30/2012 0555   Lipase      Component Value Date/Time   LIPASE 21 06/07/2012 1938       Studies/Results: No results found.  Anti-infectives: Anti-infectives   Start     Dose/Rate Route Frequency Ordered Stop   06/28/12 1400  piperacillin-tazobactam (ZOSYN) IVPB 3.375 g     3.375 g 12.5 mL/hr over 240 Minutes Intravenous Every 8 hours 06/28/12 1245     06/27/12 1300  fluconazole (DIFLUCAN) IVPB 200 mg  Status:  Discontinued     200 mg 100 mL/hr over 60 Minutes Intravenous Every 24 hours 06/26/12 1156 06/28/12 1233   06/26/12 1400  piperacillin-tazobactam (ZOSYN) IVPB 3.375 g  Status:  Discontinued     3.375 g 12.5 mL/hr over 240 Minutes Intravenous Every 8 hours 06/26/12 1204 06/28/12 1233   06/26/12 1300  fluconazole (DIFLUCAN) IVPB 400 mg     400 mg 100 mL/hr over 120 Minutes Intravenous  Once 06/26/12 1156 06/26/12 1541   06/23/12 0900  ertapenem (INVANZ) 1 g in sodium chloride 0.9 % 50 mL IVPB  Status:  Discontinued     1 g 100 mL/hr over 30 Minutes Intravenous Every 24 hours 06/23/12 0829 06/26/12 1158   06/21/12 2000  ciprofloxacin (CIPRO) IVPB 400 mg  Status:  Discontinued     400 mg 200 mL/hr over 60 Minutes Intravenous Every 12 hours 06/21/12 1803 06/23/12 0829   06/21/12 2000  metroNIDAZOLE (FLAGYL) IVPB 500 mg  Status:  Discontinued     500 mg 100 mL/hr over 60 Minutes Intravenous Every 8 hours 06/21/12 1803 06/23/12 0829       Assessment/Plan 1. S/P partial colectomy, incidental appendectomy for diverticular abscess  - Due to presence of flatus, will introduce clear liquids today, as tolerated.  - Pt still in significant pain; will cont dilaudid PCA (as morphine PCA was not as effective), add IV tylenol to help control pain - Cont abx therapy (Zosyn)  - Cont IV fluids  - Cont SCDs, lovenox for VTE prophylaxis  - PT/OT for ambulation 2. Acute Blood Loss Anemia  - Hgb decreased from 10.6 to 10.1, this could be due to equilibration.  Will repeat CBC in the AM.   LOS: 9 days     Donah Driver 06/30/2012, 8:31 AM [phone]

## 2012-06-30 NOTE — Evaluation (Signed)
Physical Therapy Evaluation Patient Details Name: Sheri Calderon MRN: [id number] DOB: [birth year] Today's Date: 06/30/2012 Time: 0277-4128 PT Time Calculation (min): 21 min  PT Assessment / Plan / Recommendation Clinical Impression  Pt. presents to PT with a decrease in her usual functional activity and gait level following partial colectomy for  diverticulitis and abscess.  She is moving slowly but of her own volition and without physical assist.  She is already walking with family and nursing staff on the unit.  Needs to continue with this but does not appear to have skilled acute PT needs, therefore will sign off.      PT Assessment  Patent does not need any further PT services    Follow Up Recommendations  No PT follow up    Does the patient have the potential to tolerate intense rehabilitation      Barriers to Discharge        Equipment Recommendations  None recommended by PT    Recommendations for Other Services     Frequency      Precautions / Restrictions Precautions Precautions: Other (comment) (abdominal surgery) Restrictions Weight Bearing Restrictions: No   Pertinent Vitals/Pain See vitals tab       Mobility  Bed Mobility Bed Mobility: Supine to Sit Supine to Sit: 6: Modified independent (Device/Increase time);With rails Details for Bed Mobility Assistance: managing well with rails and increased time Transfers Transfers: Sit to Stand;Stand to Sit Sit to Stand: 7: Independent Stand to Sit: 7: Independent Details for Transfer Assistance: managing transitions well Ambulation/Gait Ambulation/Gait Assistance: 6: Modified independent (Device/Increase time) Ambulation Distance (Feet): 250 Feet Assistive device: None Ambulation/Gait Assistance Details: Pt. able to ambulate around unit with no overt LOB.  Moves slowly due to soreness, needed reminder to stand as erect as posstible.  Overall moving in the unit well and with family members assist to push IV pole Gait  Pattern: Step-through pattern;Trunk flexed Gait velocity: mildly decrerased General Gait Details: shuffles feet as she begins to fatigue Stairs: No    Exercises     PT Diagnosis:    PT Problem List:   PT Treatment Interventions:     PT Goals    Visit Information  Last PT Received On: 06/30/12 Assistance Needed: +1    Subjective Data  Subjective: "My husband has been walking with me on the hallway" Patient Stated Goal: get out of the hospital ASAP and return to work /life   Prior [place] Lives With: Spouse Available Help at Discharge: Available 24 hours/day Type of Home: House Home Access: Stairs to enter Technical brewer of Steps: 4 Entrance Stairs-Rails: Right;Left;Can reach both Home Layout: One level Bathroom Shower/Tub: Product/process development scientist: Handicapped height [place]: None Prior Function Level of Independence: Independent Able to Take Stairs?: Yes Driving: Yes Vocation: Full time employment Communication Communication: No difficulties    Cognition  Cognition Arousal/Alertness: Awake/alert Behavior During Therapy: WFL for tasks assessed/performed Overall Cognitive Status: Within Functional Limits for tasks assessed    Extremity/Trunk Assessment Right Upper Extremity Assessment RUE ROM/Strength/Tone: [facility] for tasks assessed Left Upper Extremity Assessment LUE ROM/Strength/Tone: WFL for tasks assessed Right Lower Extremity Assessment RLE ROM/Strength/Tone: [facility] for tasks assessed Left Lower Extremity Assessment LLE ROM/Strength/Tone: WFL for tasks assessed Trunk Assessment Trunk Assessment: Normal   Balance    End of Session PT - End of Session Activity Tolerance: Patient tolerated treatment well Patient left: in bed;with call bell/phone within reach;with family/visitor present Nurse Communication: Mobility status  GP  Ladona Ridgel 06/30/2012, 12:37 PM Gerlean Ren PT Acute  Rehab Services [phone] [place]

## 2012-07-01 LAB — CEA: CEA: 1.5 ng/mL (ref 0.0–5.0)

## 2012-07-01 MED ORDER — HYDROMORPHONE HCL PF 1 MG/ML IJ SOLN
0.5000 mg | INTRAMUSCULAR | Status: DC | PRN
  Administered 2012-07-01 – 2012-07-03 (×10): 1 mg via INTRAVENOUS
  Filled 2012-07-01 (×13): qty 1

## 2012-07-01 MED ORDER — PANTOPRAZOLE SODIUM 40 MG PO TBEC
40.0000 mg | DELAYED_RELEASE_TABLET | Freq: Every day | ORAL | Status: DC
  Administered 2012-07-01 – 2012-07-04 (×4): 40 mg via ORAL
  Filled 2012-07-01 (×6): qty 1

## 2012-07-01 MED ORDER — OXYCODONE HCL 5 MG PO TABS
5.0000 mg | ORAL_TABLET | ORAL | Status: DC | PRN
  Administered 2012-07-01 – 2012-07-04 (×9): 10 mg via ORAL
  Filled 2012-07-01 (×9): qty 2

## 2012-07-01 NOTE — Progress Notes (Signed)
Pt's abdomen is distended with faint bowel sounds. Pt having pain in RUQ and LUQ. VS stable. Pt ambulating and had a BM this morning. [facility] MD on-call paged and notified. No new orders given at this time. Will continue to monitor.  Estelle Grumbles, RN 07/01/12

## 2012-07-01 NOTE — Progress Notes (Signed)
Occupational Therapy Note  OT order received and appreciated. Spoke with pt and husband who report that pt is at baseline with ADLs.  Per PT note, pt independent with mobility. No acute OT needs at this time. Signing off.  07/01/2012 Darrol Jump OTR/L Pager [phone] Office [phone]

## 2012-07-01 NOTE — Progress Notes (Signed)
Patient ID: Sheri Calderon, female   DOB: 05/11/1965, 47 y.o.   MRN: [id number] 3 Days Post-Op  Subjective: Pt feeling much better.  Passing flatus and had a couple of BMs.  No nausea.  Tolerating clear liquids  Objective: Vital signs in last 24 hours: Temp:  [97.5 F (36.4 C)-98.1 F (36.7 C)] 98.1 F (36.7 C) (05/02 0601) Pulse Rate:  [81-87] 84 (05/02 0601) Resp:  [16-24] 18 (05/02 0601) BP: (133-159)/(87-95) 159/87 mmHg (05/02 0601) SpO2:  [97 %-99 %] 97 % (05/02 0601) FiO2 (%):  [40 %-41 %] 40 % (05/02 0411) Last BM Date: 06/30/12  Intake/Output from previous day: 05/01 0701 - 05/02 0700 In: 4855 [P.O.:220; I.V.:3425; IV Piggyback:1210] Out: 351 [Urine:350; Stool:1] Intake/Output this shift:    PE: Abd: soft, tender, wound is c/d/i with staples, dressing removed, few BS, some distention.  Lab Results:   Recent Labs  06/29/12 0440 06/30/12 0555  WBC 8.6 6.7  HGB 10.6* 10.1*  HCT 31.5* 30.6*  PLT 378 383   BMET  Recent Labs  06/29/12 0440 06/30/12 0555  NA 138 137  K 3.5 3.2*  CL 104 103  CO2 26 27  GLUCOSE 77 84  BUN <3* 3*  CREATININE 0.75 0.70  CALCIUM 8.0* 8.4   PT/INR No results found for this basename: LABPROT, INR,  in the last 72 hours CMP     Component Value Date/Time   NA 137 06/30/2012 0555   K 3.2* 06/30/2012 0555   CL 103 06/30/2012 0555   CO2 27 06/30/2012 0555   GLUCOSE 84 06/30/2012 0555   BUN 3* 06/30/2012 0555   CREATININE 0.70 06/30/2012 0555   CALCIUM 8.4 06/30/2012 0555   PROT 7.3 06/07/2012 1938   ALBUMIN 3.5 06/07/2012 1938   AST 10 06/07/2012 1938   ALT 9 06/07/2012 1938   ALKPHOS 63 06/07/2012 1938   BILITOT 0.3 06/07/2012 1938   GFRNONAA >90 06/30/2012 0555   GFRAA >90 06/30/2012 0555   Lipase     Component Value Date/Time   LIPASE 21 06/07/2012 1938       Studies/Results: No results found.  Anti-infectives: Anti-infectives   Start     Dose/Rate Route Frequency Ordered Stop   06/28/12 1400  piperacillin-tazobactam (ZOSYN) IVPB 3.375 g      3.375 g 12.5 mL/hr over 240 Minutes Intravenous Every 8 hours 06/28/12 1245     06/27/12 1300  fluconazole (DIFLUCAN) IVPB 200 mg  Status:  Discontinued     200 mg 100 mL/hr over 60 Minutes Intravenous Every 24 hours 06/26/12 1156 06/28/12 1233   06/26/12 1400  piperacillin-tazobactam (ZOSYN) IVPB 3.375 g  Status:  Discontinued     3.375 g 12.5 mL/hr over 240 Minutes Intravenous Every 8 hours 06/26/12 1204 06/28/12 1233   06/26/12 1300  fluconazole (DIFLUCAN) IVPB 400 mg     400 mg 100 mL/hr over 120 Minutes Intravenous  Once 06/26/12 1156 06/26/12 1541   06/23/12 0900  ertapenem (INVANZ) 1 g in sodium chloride 0.9 % 50 mL IVPB  Status:  Discontinued     1 g 100 mL/hr over 30 Minutes Intravenous Every 24 hours 06/23/12 0829 06/26/12 1158   06/21/12 2000  ciprofloxacin (CIPRO) IVPB 400 mg  Status:  Discontinued     400 mg 200 mL/hr over 60 Minutes Intravenous Every 12 hours 06/21/12 1803 06/23/12 0829   06/21/12 2000  metroNIDAZOLE (FLAGYL) IVPB 500 mg  Status:  Discontinued     500 mg 100 mL/hr over  60 Minutes Intravenous Every 8 hours 06/21/12 1803 06/23/12 0829       Assessment/Plan  1. S/p sigmoid colectomy, path reveals adeno CA with 10 nodes +  Plan: 1. advance to full liquids 2. Dc PCA, start oral pain meds 3. Zosyn still running, will d/w MD how long he wants this 4. CEA pending 5. Will d/w MD about oncology and whether we will get an inpatient consult or proceed as outpatient.   LOS: 10 days    Latavious Bitter E 07/01/2012, 9:21 AM Pager: [phone]

## 2012-07-01 NOTE — Progress Notes (Signed)
I have seen and examined the patient and agree with the assessment and plans. 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 Ninfa Linden  MD, FACS

## 2012-07-01 NOTE — Progress Notes (Signed)
Cardiac monitoring discontinued per Dr Grandville Silos for pt patient had been in NSR at all times.

## 2012-07-02 NOTE — Progress Notes (Signed)
4 Days Post-Op   Assessment: s/p Procedure(s): PARTIAL COLECTOMY Patient Active Problem List   Diagnosis Date Noted  . Restless legs syndrome   . Abdominal pain 06/08/2012  . Diverticulitis 06/08/2012  . Hyponatremia 06/08/2012  . Leukocytosis 06/08/2012    Not ready to advance diet, thinnk abd bloatinig secondary to air swallowing  Plan: No change today, ask to avoid eating ice and carbonated drinks. Continue to ambulate as much as possible. Will recheck some labs in am to be sure all OK  Subjective: Feels more distended today than yesterday, passed a little gas but had a BM last night and again this am. No nausea, intermittent crampy abd pain. Eating a lot of ice chips  Objective: Vital signs in last 24 hours: Temp:  [97.4 F (36.3 C)-98 F (36.7 C)] 98 F (36.7 C) (05/03 0612) Pulse Rate:  [79-87] 86 (05/03 0612) Resp:  [16-18] 18 (05/03 0612) BP: (143-162)/(92-97) 161/96 mmHg (05/03 0612) SpO2:  [98 %-100 %] 100 % (05/03 0612)   Intake/Output from previous day: 05/02 0701 - 05/03 0700 In: 1141.7 [I.V.:1091.7; IV Piggyback:50] Out: -   General appearance: alert, cooperative, no distress and sitting up in bed visiting with family, paying bills Resp: clear to auscultation bilaterally GI: soft but distended, not tender except at incision, BS+  Incision: healing well  Lab Results:   Recent Labs  06/30/12 0555  WBC 6.7  HGB 10.1*  HCT 30.6*  PLT 383   BMET  Recent Labs  06/30/12 0555  NA 137  K 3.2*  CL 103  CO2 27  GLUCOSE 84  BUN 3*  CREATININE 0.70  CALCIUM 8.4    MEDS, Scheduled . antiseptic oral rinse  15 mL Mouth Rinse q12n4p  . chlorhexidine  15 mL Mouth Rinse BID  . Chlorhexidine Gluconate Cloth  6 each Topical Q0600  . enoxaparin (LOVENOX) injection  40 mg Subcutaneous Q24H  . mupirocin ointment  1 application Nasal BID  . pantoprazole  40 mg Oral Daily  . piperacillin-tazobactam (ZOSYN)  IV  3.375 g Intravenous Q8H     Studies/Results: No results found.    LOS: 11 days     Haywood Lasso, MD, [facility], [place] [phone]   07/02/2012 9:22 AM

## 2012-07-03 LAB — CBC
HCT: 30.7 % — ABNORMAL LOW (ref 36.0–46.0)
Hemoglobin: 10.5 g/dL — ABNORMAL LOW (ref 12.0–15.0)
MCV: 81.9 fL (ref 78.0–100.0)
WBC: 4.8 10*3/uL (ref 4.0–10.5)

## 2012-07-03 LAB — BASIC METABOLIC PANEL
BUN: <3 mg/dL — ABNORMAL LOW (ref 6–23)
CO2: 29 mEq/L (ref 19–32)
Chloride: 102 mEq/L (ref 96–112)
GFR calc Af Amer: >90 mL/min (ref >90)
Glucose, Bld: 107 mg/dL — ABNORMAL HIGH (ref 70–99)
Potassium: 3.4 mEq/L — ABNORMAL LOW (ref 3.5–5.1)

## 2012-07-03 NOTE — Progress Notes (Signed)
5 Days Post-Op   Assessment: s/p Procedure(s): PARTIAL COLECTOMY Patient Active Problem List   Diagnosis Date Noted  . Restless legs syndrome   . Abdominal pain 06/08/2012  . Diverticulitis 06/08/2012  . Hyponatremia 06/08/2012  . Leukocytosis 06/08/2012    Improved and close to being able to go home Mild hypokalemia  Plan: Advance diet Supplemental K May be able to go later today Subjective: Feels better, less distended, passed more gas, no nausea, hungry, less pain  Objective: Vital signs in last 24 hours: Temp:  [98.2 F (36.8 C)-98.5 F (36.9 C)] 98.5 F (36.9 C) (05/04 0627) Pulse Rate:  [80-96] 80 (05/04 0627) Resp:  [19] 19 (05/04 0627) BP: (152-167)/(89-98) 152/89 mmHg (05/04 0627) SpO2:  [100 %] 100 % (05/04 0627)   Intake/Output from previous day:    General appearance: alert, cooperative and no distress Resp: clear to auscultation bilaterally GI: Still a little distended, less than yesterday, still some incisional tenderness, BS+  Incision: healing well  Lab Results:   Recent Labs  07/03/12 0625  WBC 4.8  HGB 10.5*  HCT 30.7*  PLT 460*   BMET  Recent Labs  07/03/12 0625  NA 137  K 3.4*  CL 102  CO2 29  GLUCOSE 107*  BUN <3*  CREATININE 0.68  CALCIUM 8.7    MEDS, Scheduled . antiseptic oral rinse  15 mL Mouth Rinse q12n4p  . chlorhexidine  15 mL Mouth Rinse BID  . enoxaparin (LOVENOX) injection  40 mg Subcutaneous Q24H  . mupirocin ointment  1 application Nasal BID  . pantoprazole  40 mg Oral Daily  . piperacillin-tazobactam (ZOSYN)  IV  3.375 g Intravenous Q8H    Studies/Results: No results found.    LOS: 12 days     Haywood Lasso, MD, [facility], [place]   07/03/2012 9:57 AM

## 2012-07-04 ENCOUNTER — Other Ambulatory Visit (INDEPENDENT_AMBULATORY_CARE_PROVIDER_SITE_OTHER): Payer: Self-pay | Admitting: Surgery

## 2012-07-04 ENCOUNTER — Encounter (HOSPITAL_COMMUNITY): Payer: Self-pay | Admitting: Surgery

## 2012-07-04 ENCOUNTER — Encounter (INDEPENDENT_AMBULATORY_CARE_PROVIDER_SITE_OTHER): Payer: BC Managed Care – PPO | Admitting: General Surgery

## 2012-07-04 MED ORDER — OXYCODONE-ACETAMINOPHEN 5-325 MG PO TABS: 2.0000 | ORAL_TABLET | ORAL | Status: DC | PRN

## 2012-07-04 NOTE — Discharge Summary (Signed)
Physician Discharge Summary  Patient ID: Sheri Calderon MRN: [id number] DOB/AGE: 1965/09/02 47 y.o.  Admit date: 06/21/2012 Discharge date: 07/04/2012  Admission Diagnoses:  Discharge Diagnoses:  Principal Problem:   Diverticulitis Active Problems:   Abdominal pain   Leukocytosis colon adenocarcinoma  Discharged Condition: good  Hospital Course: admitted for diverticulitis with abscess.  Because of lack of improvement, bowel was prepped and she was taken to OR for resection.  Post op found to also have adenocarcinoma of colon wit positive nodes.  Had uneventful post op recovery.  Discharge POD#6  Consults: None  Significant Diagnostic Studies:   Treatments: surgery: partial colectomy, appendectomy  Discharge Exam: Blood pressure 149/81, pulse 71, temperature 98.5 F (36.9 C), temperature source Oral, resp. rate 18, height 5' 7"  (1.702 m), weight 184 lb (83.462 kg), last menstrual period 06/07/2012, SpO2 100.00%. General appearance: alert, cooperative and no distress Resp: clear to auscultation bilaterally Incision/Wound:minimal abd fullness, non tender, incision healing well  Disposition: 01-Home or Self Care   Future Appointments Provider Department Dept Phone   07/04/2012 3:30 PM Rolm Bookbinder, MD [facility], [place] [phone]       Medication List    TAKE these medications       ciprofloxacin 500 MG tablet  Commonly known as:  CIPRO  Take 1 tablet (500 mg total) by mouth 2 (two) times daily.     HYDROcodone-acetaminophen 5-325 MG per tablet  Commonly known as:  NORCO/VICODIN  Take 1 tablet by mouth every 6 (six) hours as needed for pain.     metroNIDAZOLE 500 MG tablet  Commonly known as:  FLAGYL  Take 500 mg by mouth every 8 (eight) hours.     naproxen sodium 220 MG tablet  Commonly known as:  ANAPROX  Take 440 mg by mouth every 3 (three) hours as needed (for stomach, back pain).     oxyCODONE-acetaminophen 5-325 MG per tablet  Commonly known  as:  ROXICET  Take 2 tablets by mouth every 4 (four) hours as needed for pain.           Follow-up Information   Follow up with [facility], MD. (office will call you with surgery plans)    Contact information:   [address] [place] [address] [phone]       Signed: Harl Bowie 07/04/2012, 8:17 AM

## 2012-07-04 NOTE — Progress Notes (Signed)
Patient ID: Sheri Calderon, female   DOB: May 10, 1965, 47 y.o.   MRN: [id number]  Feeling better today Passing flatus and having BM's  Less bloated Incision clean  Plan: discharge

## 2012-07-04 NOTE — Progress Notes (Signed)
Patient discharged to home with family.  Discharge teaching completed including follow up care, medications, signs and symptoms of infection, diet and activity.  Verbalizes understanding with no further questions.  Vital signs stable. Tolerating diet with no complaints of nausea. Discharged per wheelchair with family.

## 2012-07-05 ENCOUNTER — Telehealth: Payer: Self-pay | Admitting: *Deleted

## 2012-07-05 ENCOUNTER — Telehealth: Payer: Self-pay | Admitting: Oncology

## 2012-07-05 NOTE — Telephone Encounter (Signed)
Spoke with patient and confirmed appointment with Dr. Benay Spice for 07/18/12.  Contact names and phone numbers were provided.

## 2012-07-05 NOTE — Telephone Encounter (Signed)
Pt scheduled per Barnett Applebaum

## 2012-07-06 ENCOUNTER — Encounter (HOSPITAL_COMMUNITY): Payer: Self-pay | Admitting: Pharmacy Technician

## 2012-07-07 ENCOUNTER — Encounter (INDEPENDENT_AMBULATORY_CARE_PROVIDER_SITE_OTHER): Payer: BC Managed Care – PPO | Admitting: Surgery

## 2012-07-07 ENCOUNTER — Other Ambulatory Visit (HOSPITAL_COMMUNITY): Payer: Self-pay | Admitting: *Deleted

## 2012-07-07 ENCOUNTER — Encounter (HOSPITAL_COMMUNITY): Payer: Self-pay | Admitting: *Deleted

## 2012-07-09 NOTE — H&P (Signed)
Sheri Calderon is an 47 y.o. female.  Chief Complaint: Colon cancer HPI: She is status post recent exploratory laparotomy was expected to be diverticulitis with abscess. Unfortunately, she was also found to have perforated colon cancer with positive nodes. She now presents for Port-A-Cath insertion. She is currently stable without complaints  Past Medical History  Diagnosis Date  . Lower extremity edema   . Restless legs syndrome   . Diverticulitis     "this is my 2nd time in hospital w/this in the last 2 wk" (06/21/2012)  . Atrial fibrillation     resolved - does not see cardiologist  . Colon cancer     Past Surgical History  Procedure Laterality Date  . Tubal ligation  ~ 1989  . Partial colectomy N/A 06/28/2012    Procedure: PARTIAL COLECTOMY;  Surgeon: Harl Bowie, MD;  Location: [place];  Service: General;  Laterality: N/A;    Family History  Problem Relation Age of Onset  . Diabetes Mother   . Heart disease Mother   . Stomach cancer Mother   . Cancer Mother     Ovarian CA  . COPD Father   . Hypertension Father   . Hyperlipidemia Father    Social History:  reports that she quit smoking about 4 weeks ago. Her smoking use included Cigarettes. She has a 15 pack-year smoking history. She has never used smokeless tobacco. She reports that she does not drink alcohol or use illicit drugs.  Allergies: No Known Allergies  No prescriptions prior to admission    No results found for this or any previous visit (from the past 48 hour(s)). No results found.  Review of Systems  All other systems reviewed and are negative.    Height 5' 7.5" (1.715 m), weight 180 lb (81.647 kg), last menstrual period 06/30/2012. Physical Exam  Constitutional: She is oriented to person, place, and time. She appears well-developed and well-nourished. No distress.  HENT:  Head: Normocephalic and atraumatic.  Eyes: Conjunctivae are normal. Pupils are equal, round, and reactive to light.  Neck:  Normal range of motion. Neck supple. No tracheal deviation present.  Cardiovascular: Normal rate, regular rhythm, normal heart sounds and intact distal pulses.   No murmur heard. Respiratory: Effort normal and breath sounds normal. No respiratory distress. She has no wheezes.  GI: Soft. Bowel sounds are normal. She exhibits no distension.  Musculoskeletal: Normal range of motion.  Neurological: She is alert and oriented to person, place, and time.  Skin: Skin is warm and dry. No erythema.  Psychiatric: Her behavior is normal. Judgment normal.     Assessment/Plan Colon cancer with need for IV chemotherapy  We'll proceed with Port-A-Cath insertion. I discussed this with the patient and her husband in detail. I discussed the risks of surgery which includes but is not limited to bleeding, infection, injury to surrounding structures, pneumothorax, etc. She understands and wishes to proceed  Sheri Calderon A 07/09/2012, 5:33 PM

## 2012-07-11 ENCOUNTER — Encounter (HOSPITAL_COMMUNITY): Payer: Self-pay | Admitting: *Deleted

## 2012-07-11 ENCOUNTER — Encounter (HOSPITAL_COMMUNITY)
Admission: RE | Disposition: A | Discharge status: Home / Self Care | Payer: Self-pay | Source: Ambulatory Visit | Attending: Surgery

## 2012-07-11 ENCOUNTER — Ambulatory Visit (HOSPITAL_COMMUNITY)
Admission: RE | Admit: 2012-07-11 | Discharge: 2012-07-11 | Disposition: A | Discharge status: Home / Self Care | Payer: BC Managed Care – PPO | Source: Ambulatory Visit | Attending: Surgery | Admitting: Surgery

## 2012-07-11 ENCOUNTER — Ambulatory Visit (HOSPITAL_COMMUNITY): Payer: BC Managed Care – PPO

## 2012-07-11 ENCOUNTER — Encounter (HOSPITAL_COMMUNITY): Payer: Self-pay | Admitting: Anesthesiology

## 2012-07-11 ENCOUNTER — Ambulatory Visit (HOSPITAL_COMMUNITY): Payer: BC Managed Care – PPO | Admitting: Anesthesiology

## 2012-07-11 DIAGNOSIS — C189 Malignant neoplasm of colon, unspecified: Secondary | ICD-10-CM | POA: Insufficient documentation

## 2012-07-11 HISTORY — DX: Malignant neoplasm of colon, unspecified: C18.9

## 2012-07-11 HISTORY — PX: PORTACATH PLACEMENT: SHX2246

## 2012-07-11 LAB — SURGICAL PCR SCREEN: MRSA, PCR: POSITIVE — AB

## 2012-07-11 SURGERY — INSERTION, TUNNELED CENTRAL VENOUS DEVICE, WITH PORT
Anesthesia: General | Wound class: Clean

## 2012-07-11 MED ORDER — OXYCODONE-ACETAMINOPHEN 10-325 MG PO TABS: 1.0000 | ORAL_TABLET | ORAL | Status: DC | PRN

## 2012-07-11 MED ORDER — LIDOCAINE HCL 1 % IJ SOLN
INTRAMUSCULAR | Status: DC | PRN
  Administered 2012-07-11: 10 mg

## 2012-07-11 MED ORDER — LIDOCAINE HCL 1 % IJ SOLN
INTRAMUSCULAR | Status: AC
  Filled 2012-07-11: qty 20

## 2012-07-11 MED ORDER — CEFAZOLIN SODIUM-DEXTROSE 2-3 GM-% IV SOLR
2.0000 g | INTRAVENOUS | Status: AC
  Administered 2012-07-11: 2 g via INTRAVENOUS

## 2012-07-11 MED ORDER — MEPERIDINE HCL 50 MG/ML IJ SOLN: 6.2500 mg | INTRAMUSCULAR | Status: DC | PRN

## 2012-07-11 MED ORDER — CEFAZOLIN SODIUM-DEXTROSE 2-3 GM-% IV SOLR
INTRAVENOUS | Status: AC
  Filled 2012-07-11: qty 50

## 2012-07-11 MED ORDER — ONDANSETRON HCL 4 MG/2ML IJ SOLN
INTRAMUSCULAR | Status: DC | PRN
  Administered 2012-07-11: 4 mg via INTRAVENOUS

## 2012-07-11 MED ORDER — HEPARIN SOD (PORK) LOCK FLUSH 100 UNIT/ML IV SOLN
INTRAVENOUS | Status: AC
  Filled 2012-07-11: qty 10

## 2012-07-11 MED ORDER — BUPIVACAINE-EPINEPHRINE PF 0.25-1:200000 % IJ SOLN
INTRAMUSCULAR | Status: AC
  Filled 2012-07-11: qty 30

## 2012-07-11 MED ORDER — FENTANYL CITRATE 0.05 MG/ML IJ SOLN
INTRAMUSCULAR | Status: DC | PRN
  Administered 2012-07-11: 50 ug via INTRAVENOUS

## 2012-07-11 MED ORDER — DEXAMETHASONE SODIUM PHOSPHATE 10 MG/ML IJ SOLN
INTRAMUSCULAR | Status: DC | PRN
  Administered 2012-07-11: 8 mg via INTRAVENOUS

## 2012-07-11 MED ORDER — LACTATED RINGERS IV SOLN
INTRAVENOUS | Status: DC
  Administered 2012-07-11: 1000 mL via INTRAVENOUS

## 2012-07-11 MED ORDER — LIDOCAINE-EPINEPHRINE 1 %-1:100000 IJ SOLN
INTRAMUSCULAR | Status: AC
  Filled 2012-07-11: qty 1

## 2012-07-11 MED ORDER — LACTATED RINGERS IV SOLN: INTRAVENOUS | Status: DC

## 2012-07-11 MED ORDER — FENTANYL CITRATE 0.05 MG/ML IJ SOLN: 25.0000 ug | INTRAMUSCULAR | Status: DC | PRN

## 2012-07-11 MED ORDER — PROPOFOL 10 MG/ML IV BOLUS
INTRAVENOUS | Status: DC | PRN
  Administered 2012-07-11: 150 mg via INTRAVENOUS

## 2012-07-11 MED ORDER — MIDAZOLAM HCL 5 MG/5ML IJ SOLN
INTRAMUSCULAR | Status: DC | PRN
  Administered 2012-07-11: 2 mg via INTRAVENOUS

## 2012-07-11 MED ORDER — SODIUM CHLORIDE 0.9 % IR SOLN
Status: DC
  Filled 2012-07-11: qty 1.2

## 2012-07-11 MED ORDER — MUPIROCIN 2 % EX OINT
TOPICAL_OINTMENT | CUTANEOUS | Status: AC
  Administered 2012-07-11: 11:00:00
  Filled 2012-07-11: qty 22

## 2012-07-11 MED ORDER — PROMETHAZINE HCL 25 MG/ML IJ SOLN: 6.2500 mg | INTRAMUSCULAR | Status: DC | PRN

## 2012-07-11 SURGICAL SUPPLY — 47 items
ADH SKN CLS APL DERMABOND .7 (GAUZE/BANDAGES/DRESSINGS)
APL SKNCLS STERI-STRIP NONHPOA (GAUZE/BANDAGES/DRESSINGS)
BAG DECANTER FOR FLEXI CONT (MISCELLANEOUS) ×2 IMPLANT
BENZOIN TINCTURE PRP APPL 2/3 (GAUZE/BANDAGES/DRESSINGS) IMPLANT
BLADE HEX COATED 2.75 (ELECTRODE) ×2 IMPLANT
BLADE SURG 15 STRL LF DISP TIS (BLADE) ×1 IMPLANT
BLADE SURG 15 STRL SS (BLADE) ×2
BLADE SURG SZ11 CARB STEEL (BLADE) ×2 IMPLANT
CLOTH BEACON ORANGE TIMEOUT ST (SAFETY) ×2 IMPLANT
DECANTER SPIKE VIAL GLASS SM (MISCELLANEOUS) ×2 IMPLANT
DERMABOND ADVANCED (GAUZE/BANDAGES/DRESSINGS)
DERMABOND ADVANCED .7 DNX12 (GAUZE/BANDAGES/DRESSINGS) IMPLANT
DRAPE C-ARM 42X72 X-RAY (DRAPES) ×2 IMPLANT
DRAPE LAPAROSCOPIC ABDOMINAL (DRAPES) ×2 IMPLANT
DRSG TEGADERM 2-3/8X2-3/4 SM (GAUZE/BANDAGES/DRESSINGS) IMPLANT
DRSG TEGADERM 4X4.75 (GAUZE/BANDAGES/DRESSINGS) IMPLANT
DRSG TELFA 4X5 ISLAND ADH (GAUZE/BANDAGES/DRESSINGS) ×1 IMPLANT
ELECT REM PT RETURN 9FT ADLT (ELECTROSURGICAL) ×2
ELECTRODE REM PT RTRN 9FT ADLT (ELECTROSURGICAL) ×1 IMPLANT
GAUZE SPONGE 4X4 16PLY XRAY LF (GAUZE/BANDAGES/DRESSINGS) ×2 IMPLANT
GLOVE BIO SURGEON STRL SZ7 (GLOVE) ×6 IMPLANT
GLOVE BIOGEL PI IND STRL 7.0 (GLOVE) ×1 IMPLANT
GLOVE BIOGEL PI INDICATOR 7.0 (GLOVE) ×1
GLOVE SURG SIGNA 7.5 PF LTX (GLOVE) ×2 IMPLANT
GOWN STRL NON-REIN LRG LVL3 (GOWN DISPOSABLE) ×2 IMPLANT
GOWN STRL REIN XL XLG (GOWN DISPOSABLE) ×4 IMPLANT
KIT BASIN OR (CUSTOM PROCEDURE TRAY) ×2 IMPLANT
KIT PORT POWER 8FR ISP CVUE (Catheter) ×1 IMPLANT
KIT PORT POWER ISP 8FR (Catheter) IMPLANT
KIT POWER CATH 8FR (Catheter) IMPLANT
NDL HYPO 25X1 1.5 SAFETY (NEEDLE) ×1 IMPLANT
NEEDLE HYPO 25X1 1.5 SAFETY (NEEDLE) ×2 IMPLANT
NS IRRIG 1000ML POUR BTL (IV SOLUTION) ×2 IMPLANT
PACK BASIC VI WITH GOWN DISP (CUSTOM PROCEDURE TRAY) ×2 IMPLANT
PENCIL BUTTON HOLSTER BLD 10FT (ELECTRODE) ×2 IMPLANT
SPONGE GAUZE 4X4 12PLY (GAUZE/BANDAGES/DRESSINGS) IMPLANT
STRIP CLOSURE SKIN 1/2X4 (GAUZE/BANDAGES/DRESSINGS) IMPLANT
SUT MNCRL AB 4-0 PS2 18 (SUTURE) ×2 IMPLANT
SUT PROLENE 2 0 SH DA (SUTURE) ×2 IMPLANT
SUT SILK 2 0 (SUTURE)
SUT SILK 2-0 30XBRD TIE 12 (SUTURE) IMPLANT
SUT VIC AB 3-0 SH 27 (SUTURE) ×2
SUT VIC AB 3-0 SH 27XBRD (SUTURE) ×1 IMPLANT
SYR CONTROL 10ML LL (SYRINGE) ×2 IMPLANT
SYRINGE 10CC LL (SYRINGE) ×2 IMPLANT
TAPE STRIPS DRAPE STRL (GAUZE/BANDAGES/DRESSINGS) ×1 IMPLANT
TOWEL OR 17X26 10 PK STRL BLUE (TOWEL DISPOSABLE) ×2 IMPLANT

## 2012-07-11 NOTE — Transfer of Care (Signed)
Immediate Anesthesia Transfer of Care Note  Patient: Sheri Calderon  Procedure(s) Performed: Procedure(s): INSERTION PORT-A-CATH (N/A)  Patient Location: PACU  Anesthesia Type:General  Level of Consciousness: awake, sedated and patient cooperative  Airway & Oxygen Therapy: Patient Spontanous Breathing and Patient connected to face mask oxygen  Post-op Assessment: Report given to PACU RN and Post -op Vital signs reviewed and stable  Post vital signs: Reviewed and stable  Complications: No apparent anesthesia complications

## 2012-07-11 NOTE — Interval H&P Note (Signed)
History and Physical Interval Note: no change in H and P.  07/11/2012 11:08 AM  Sheri Calderon  has presented today for surgery, with the diagnosis of colon cancer  The various methods of treatment have been discussed with the patient and family. After consideration of risks, benefits and other options for treatment, the patient has consented to  Procedure(s): INSERTION PORT-A-CATH (N/A) as a surgical intervention .  The patient's history has been reviewed, patient examined, no change in status, stable for surgery.  I have reviewed the patient's chart and labs.  Questions were answered to the patient's satisfaction.  Will remove staples from the midline incision   [name]

## 2012-07-11 NOTE — Progress Notes (Signed)
Portable chest x-ray results reviewed.

## 2012-07-11 NOTE — Progress Notes (Signed)
Portable upright chest x-ray done.

## 2012-07-11 NOTE — Op Note (Signed)
INSERTION PORT-A-CATH  Procedure Note  Sheri Calderon 07/11/2012   Pre-op Diagnosis: colon cancer     Post-op Diagnosis: same  Procedure(s): INSERTION PORT-A-CATH  Surgeon(s): Harl Bowie, MD  Anesthesia: Monitor Anesthesia Care  Staff:  Circulator: Kipp Laurence, RN Scrub Person: Lannette Donath I Key  Estimated Blood Loss: Minimal                         [name]   Date: 07/11/2012  Time: 12:12 PM

## 2012-07-11 NOTE — Anesthesia Preprocedure Evaluation (Addendum)
Anesthesia Evaluation  Patient identified by MRN, date of birth, ID band Patient awake    Reviewed: Allergy & Precautions, H&P , NPO status , Patient's Chart, lab work & pertinent test results  Airway Mallampati: II TM Distance: >3 FB Neck ROM: Full    Dental no notable dental hx. (+) Chipped and Poor Dentition   Pulmonary neg pulmonary ROS,  breath sounds clear to auscultation  Pulmonary exam normal       Cardiovascular + dysrhythmias Atrial Fibrillation Rhythm:Regular Rate:Normal     Neuro/Psych negative neurological ROS  negative psych ROS   GI/Hepatic negative GI ROS, Neg liver ROS,   Endo/Other  negative endocrine ROS  Renal/GU negative Renal ROS  negative genitourinary   Musculoskeletal negative musculoskeletal ROS (+)   Abdominal   Peds negative pediatric ROS (+)  Hematology negative hematology ROS (+)   Anesthesia Other Findings   Reproductive/Obstetrics negative OB ROS                          Anesthesia Physical Anesthesia Plan  ASA: II  Anesthesia Plan: General   Post-op Pain Management:    Induction: Intravenous  Airway Management Planned: LMA  Additional Equipment:   Intra-op Plan:   Post-operative Plan:   Informed Consent: I have reviewed the patients History and Physical, chart, labs and discussed the procedure including the risks, benefits and alternatives for the proposed anesthesia with the patient or authorized representative who has indicated his/her understanding and acceptance.   Dental advisory given  Plan Discussed with: CRNA  Anesthesia Plan Comments:         Anesthesia Quick Evaluation

## 2012-07-12 ENCOUNTER — Encounter (HOSPITAL_COMMUNITY): Payer: Self-pay | Admitting: Surgery

## 2012-07-12 ENCOUNTER — Ambulatory Visit (INDEPENDENT_AMBULATORY_CARE_PROVIDER_SITE_OTHER): Payer: BC Managed Care – PPO

## 2012-07-12 VITALS — BP 128/72 | HR 68 | Temp 98.0°F | Resp 18 | Ht 67.0 in | Wt 178.0 lb

## 2012-07-12 DIAGNOSIS — Z4800 Encounter for change or removal of nonsurgical wound dressing: Secondary | ICD-10-CM

## 2012-07-12 NOTE — Op Note (Signed)
NAME:  [name], [name] NO.:  [id number]  MEDICAL RECORD NO.:  [id number]  LOCATION:  [facility]                         FACILITY:  [facility]  PHYSICIAN:  Coralie Keens, M.D. DATE OF BIRTH:  [birth year]  DATE OF PROCEDURE:  07/11/2012 DATE OF DISCHARGE:  07/11/2012                              OPERATIVE REPORT   PREOPERATIVE DIAGNOSIS:  Colon cancer.  POSTOPERATIVE DIAGNOSIS:  Colon cancer.  PROCEDURE:  Left subclavian Infuse-A-Port insertion (8-French).  SURGEON:  Coralie Keens, M.D.  ANESTHESIA:  General and 1% lidocaine.  ESTIMATED BLOOD LOSS:  Minimal.  PROCEDURE IN DETAIL:  The patient was brought to the operative room, identified as Sheri Calderon.  She was placed supine on the operating room table and general anesthesia was induced.  Her left chest and neck were then prepped and draped in usual sterile fashion.  I anesthetized the skin and clavicle with lidocaine.  I then used an introducer needle to easily cannulate the left subclavian vein with the patient in Trendelenburg position.  A wire was then passed through the needle and the needle was removed.  The wire was confirmed to be in the central venous system under direct fluoroscopy.  I then anesthetized the skin further with lidocaine.  I made a longitudinal incision on the chest incorporating the localization wire.  I took this down to the chest tissue with the electrocautery.  I then created a port pocket for the port with the electrocautery.  An 8-French clear view Infuse-A-Port was brought onto the field.  It was flushed with heparinized saline.  The port fit easily into the pocket.  I then attached the catheter to the port and cut it in appropriate length.  I then passed the introducer sheath and dilator over the wire and into central venous system.  The dilator and wire were then removed.  I then fed the catheter down the introducer sheath as the sheath was peeled away into the central  venous system.  Fluoroscopy confirmed good placement in the superior vena cava. I accessed the port and good flush and return were demonstrated with heparinized saline solution.  I then sewed the port to the chest wall with 2 interrupted 3-0 Prolene sutures.  I again accessed the port and instilled it with heparinized saline solution.  I then closed the subcutaneous tissue with interrupted 3-0 Vicryl sutures and closed the skin with running 4-0 Monocryl.  Steri-Strips, Tegaderm, and gauze were then applied.  The patient tolerated the procedure well.  All the counts were correct at the end of the procedure.  The patient was then extubated in the operating room and taken in a stable condition to the recovery room.     Coralie Keens, M.D.     DB/MEDQ  D:  07/11/2012  T:  07/12/2012  Job:  [id number]

## 2012-07-12 NOTE — Anesthesia Postprocedure Evaluation (Signed)
  Anesthesia Post-op Note  Patient: Sheri Calderon  Procedure(s) Performed: Procedure(s) (LRB): INSERTION PORT-A-CATH (N/A)  Patient Location: PACU  Anesthesia Type: MAC  Level of Consciousness: awake and alert   Airway and Oxygen Therapy: Patient Spontanous Breathing  Post-op Pain: mild  Post-op Assessment: Post-op Vital signs reviewed, Patient's Cardiovascular Status Stable, Respiratory Function Stable, Patent Airway and No signs of Nausea or vomiting  Last Vitals:  Filed Vitals:   07/11/12 1358  BP: 142/87  Pulse: 68  Temp: 36.3 C  Resp: 16    Post-op Vital Signs: stable   Complications: No apparent anesthesia complications

## 2012-07-12 NOTE — Progress Notes (Signed)
Dressing Change to left upper chest  port ;Stri streps intact; applied 4x4 secured with paper tape. Patient tolerated well. Advised to keep clean and dry. To call if any changes in condition redness around port site, fever., drainage . Patient verbalized understanding.

## 2012-07-15 ENCOUNTER — Telehealth (INDEPENDENT_AMBULATORY_CARE_PROVIDER_SITE_OTHER): Payer: Self-pay

## 2012-07-15 NOTE — Telephone Encounter (Signed)
Pt called approximately 3:00 pm today.  Unable to document until now.  She was requesting a pain medication refill for Percocet.  Dr. Ninfa Linden was paged after the telephone call and did not respond.  Pt notified.

## 2012-07-18 ENCOUNTER — Telehealth: Payer: Self-pay | Admitting: *Deleted

## 2012-07-18 ENCOUNTER — Ambulatory Visit: Payer: BC Managed Care – PPO

## 2012-07-18 ENCOUNTER — Ambulatory Visit (HOSPITAL_BASED_OUTPATIENT_CLINIC_OR_DEPARTMENT_OTHER): Payer: BC Managed Care – PPO | Admitting: Oncology

## 2012-07-18 ENCOUNTER — Encounter: Payer: Self-pay | Admitting: Oncology

## 2012-07-18 ENCOUNTER — Telehealth: Payer: Self-pay | Admitting: Oncology

## 2012-07-18 ENCOUNTER — Telehealth (INDEPENDENT_AMBULATORY_CARE_PROVIDER_SITE_OTHER): Payer: Self-pay | Admitting: *Deleted

## 2012-07-18 VITALS — BP 135/91 | HR 87 | Temp 97.0°F | Resp 18 | Ht 67.0 in | Wt 173.5 lb

## 2012-07-18 DIAGNOSIS — Z809 Family history of malignant neoplasm, unspecified: Secondary | ICD-10-CM

## 2012-07-18 DIAGNOSIS — C189 Malignant neoplasm of colon, unspecified: Secondary | ICD-10-CM

## 2012-07-18 DIAGNOSIS — C779 Secondary and unspecified malignant neoplasm of lymph node, unspecified: Secondary | ICD-10-CM

## 2012-07-18 DIAGNOSIS — I4891 Unspecified atrial fibrillation: Secondary | ICD-10-CM

## 2012-07-18 DIAGNOSIS — C187 Malignant neoplasm of sigmoid colon: Secondary | ICD-10-CM

## 2012-07-18 MED ORDER — ALPRAZOLAM 0.5 MG PO TABS: 0.5000 mg | ORAL_TABLET | Freq: Two times a day (BID) | ORAL | Status: DC | PRN

## 2012-07-18 MED ORDER — PROCHLORPERAZINE MALEATE 10 MG PO TABS: 10.0000 mg | ORAL_TABLET | Freq: Four times a day (QID) | ORAL | Status: DC | PRN

## 2012-07-18 MED ORDER — LIDOCAINE-PRILOCAINE 2.5-2.5 % EX CREA: TOPICAL_CREAM | CUTANEOUS | Status: DC | PRN

## 2012-07-18 NOTE — Progress Notes (Signed)
[place] Patient Consult   Referring MD: Liv Rallis 47 y.o.  [birth year]    Reason for Referral: Colon cancer     HPI: She was admitted on 06/08/2012 with the sudden onset of abdominal pain. She was diagnosed with sigmoid diverticulitis with a focal contained perforation. Fatty infiltration of the liver was suspected. No discrete liver lesions. Lung bases were clear. She was treated with antibiotics. The pain improved. She was discharged to complete an outpatient course of ciprofloxacin and Flagyl. She was readmitted on 06/19/2012 with increased abdominal pain. A repeat CT on 06/21/2012 revealed an enlarging extraluminal fluid collection anterior to the sigmoid colon with increased inflammatory stranding around the sigmoid colon. A repeat CT on 06/26/2012 family abscess to be smaller. Persistent wall thickening was noted at the adjacent sigmoid colon.  She was taken to the operating room by Dr. Ninfa Linden on 06/28/2012 and underwent a partial colectomy and appendectomy. Extensive diverticulosis was noted of the sigmoid colon. A contained abscess was noted in the wall of the sigmoid colon. The sigmoid colon containing the abscess was removed and submitted for pathology.  The pathology [id number]) confirmed an invasive adenocarcinoma. Lymphovascular invasion was present.9 of 10 lymph nodes were positive for metastatic adenocarcinoma. Tumor focally involved cauterized tissue at non peritonealized surgical margin. No invasive tumor was identified in sections of the perforation site.  The tumor was noted to have a K-ras mutation. The tumor was microsatellite stable by PCR and no loss of mismatch repair protein expression was identified  Past Medical History  Diagnosis Date  .    Marland Kitchen Restless legs syndrome   . Diverticulitis  April 2014   .   G1 P1   . Atrial fibrillation     resolved - does not see cardiologist  . Colon cancer (T4 b, N2 b)-sigmoid  colon   06/28/2012     Past Surgical History  Procedure Laterality Date  . Tubal ligation  ~ 1989  . Partial colectomy N/A 06/28/2012    Procedure: PARTIAL COLECTOMY;  Surgeon: Harl Bowie, MD;  Location: [place];  Service: General;  Laterality: N/A;  . Portacath placement N/A 07/11/2012    Procedure: INSERTION PORT-A-CATH;  Surgeon: Harl Bowie, MD;  Location: [facility];  Service: General;  Laterality: N/A;    Family History  Problem Relation Age of Onset  . Diabetes Mother   . Heart disease Mother   . Stomach cancer Mother   . Cancer Mother     Ovarian CA  . COPD Father   . Hypertension Father   . Hyperlipidemia Father    .   Gastric cancer                                   paternal grandmother                                              48s  Current outpatient prescriptions:ALPRAZolam (XANAX) 0.5 MG tablet, Take 1 tablet (0.5 mg total) by mouth 2 (two) times daily as needed for sleep or anxiety., Disp: 60 tablet, Rfl: 1;  lidocaine-prilocaine (EMLA) cream, Apply topically as needed. Apply 1 teaspoon to PAC site 1-2 hours prior to stick and cover with plastic wrap to numb site, Disp:  30 g, Rfl: prn oxyCODONE-acetaminophen (PERCOCET) 10-325 MG per tablet, Take 1 tablet by mouth every 4 (four) hours as needed for pain., Disp: 50 tablet, Rfl: 0;  prochlorperazine (COMPAZINE) 10 MG tablet, Take 1 tablet (10 mg total) by mouth every 6 (six) hours as needed (nausea)., Disp: 60 tablet, Rfl: 1  Allergies: No Known Allergies  Social History: She lives with her husband and [place]. She works in a [employer]. She quit smoking cigarettes for 60,014. She reports rare alcohol use. No risk factors for HIV or hepatitis.   ROS:   Positives include: Recent "heaviness "over the chest, decreased visual acuity-wears reading glasses, in her mid to low back pain following surgery, postprandial abdominal pain A complete ROS was otherwise negative.  Physical Exam:  Blood pressure 135/91,  pulse 87, temperature 97 F (36.1 C), temperature source Oral, resp. rate 18, height 5' 7"  (1.702 m), weight 173 lb 8 oz (78.699 kg), last menstrual period 06/30/2012, SpO2 98.00%.  HEENT: Oropharynx without visible mass, neck without mass Lungs: Clear bilaterally Cardiac: Regular rate and rhythm Abdomen: No hepatosplenomegaly, no mass, healed surgical incision  Vascular: No leg edema Lymph nodes: No cervical, supraclavicular, axillary, or inguinal nodes Neurologic: Alert and oriented, the motor exam appears intact in the upper and lower extremities Skin: Several tattoos Musculoskeletal: No spine tenderness   LAB:  CBC  Lab Results  Component Value Date   WBC 4.8 07/03/2012   HGB 10.5* 07/03/2012   HCT 30.7* 07/03/2012   MCV 81.9 07/03/2012   PLT 460* 07/03/2012     CMP      Component Value Date/Time   NA 137 07/03/2012 0625   K 3.4* 07/03/2012 0625   CL 102 07/03/2012 0625   CO2 29 07/03/2012 0625   GLUCOSE 107* 07/03/2012 0625   BUN <3* 07/03/2012 0625   CREATININE 0.68 07/03/2012 0625   CALCIUM 8.7 07/03/2012 0625   PROT 7.3 06/07/2012 1938   ALBUMIN 3.5 06/07/2012 1938   AST 10 06/07/2012 1938   ALT 9 06/07/2012 1938   ALKPHOS 63 06/07/2012 1938   BILITOT 0.3 06/07/2012 1938   GFRNONAA >90 07/03/2012 0625   GFRAA >90 07/03/2012 0625   CEA on 06/30/2012-1.5   Radiology: As per history of present illness    Assessment/Plan:   1. 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 The tumor is associated with a bowel perforation. Tumor focally involved a radial surgical margin.  2. Bowel perforation/diverticulitis secondary to #1  3. History of atrial fibrillation  4. Family history of cancer-the sigmoid colon tumor 06/28/2012 was microsatellite stable and without loss of mismatch repair proteins  5. Status post Port-A-Cath placement 07/11/2012   Disposition:   She has been diagnosed  with locally advanced colon cancer. I discussed the diagnosis, prognosis, and adjuvant treatment options with Ms. Sheri Calderon and multiple family members. She will complete a CT of the chest within the next one week to be sure there is no evidence of measurable metastatic disease.  She is at high-risk of developing recurrent colon cancer over the next several years. I reviewed the improvement in disease-free survival associated with adjuvant systemic chemotherapy in this setting. We specifically discussed the benefit associated with adjuvant 5-fluorouracil and oxaliplatin. We discussed the FOLFOX and CAPOX regimens. She understands these chemotherapy regimens are felt to be equivalent with regard to cancer outcome. She is more comfortable receiving FOLFOX. She would like to avoid taking  a large number of pills.  We reviewed the specific toxicities associated with the FOLFOX regimen including the chance for nausea/vomiting, mucositis, diarrhea, alopecia, and hematologic toxicity. We discussed the skin rash, hyperpigmentation, and hand/foot syndrome associated with 5-fluorouracil. We discussed the various types of neuropathy caused by oxaliplatin. She will attend a chemotherapy teaching class.  Ms. Hadlock has undergone placement of a Port-A-Cath for the administration of chemotherapy. A first cycle of FOLFOX will be scheduled for 07/28/2012. She will return for an office visit and cycle 2 on 08/11/2012.  Her case will be presented at the GI tumor conference on 07/20/2012. We will specifically discuss the indication for adjuvant radiation given the bowel perforation and positive radial margin.  Her family members are advised to proceed with colonoscopy screening at an age younger than her age at diagnosis.  [place], [facility] 07/18/2012, 6:25 PM

## 2012-07-18 NOTE — Progress Notes (Signed)
Checked in new pt with no financial concerns. °

## 2012-07-18 NOTE — Progress Notes (Signed)
Met with patient and family.  Explained role of nurse navigator.  Educational information provided on colon cancer along with [facility] resource sheet and phone numbers.Referral made to dietician for diet education.  SW here to see patient and assess per patient request.  Will continue to follow as needed.

## 2012-07-18 NOTE — Telephone Encounter (Signed)
Per staff message and POF I have scheduled appts.  [name]  

## 2012-07-18 NOTE — Telephone Encounter (Signed)
gv and printed appt sched and avs for pt...pt sched to see nut 6.3.14....emailed MW to add tx...pt aware cs will contact to sched ct

## 2012-07-18 NOTE — Telephone Encounter (Signed)
Patient has called again today requesting a refill of pain medication Percocet.  Ninfa Linden MD paged at this time.  Spoke to [place] MD who approved Percocet 5/360m 1 tablet every 4-6 hours as needed for pain #30 no refills.  Dr. BBarry Dieneshas signed the prescription per Dr. BNinfa Lindenwho is in the OR.  Patient updated and will come to pickup prescription at this time.

## 2012-07-20 ENCOUNTER — Telehealth: Payer: Self-pay | Admitting: Oncology

## 2012-07-20 ENCOUNTER — Other Ambulatory Visit: Payer: Self-pay | Admitting: *Deleted

## 2012-07-20 DIAGNOSIS — C189 Malignant neoplasm of colon, unspecified: Secondary | ICD-10-CM

## 2012-07-20 NOTE — Telephone Encounter (Signed)
Added genetics appt per 5/21 pof. No other orders. S/w pt re d/t for 7/7 and pt will get new schedule when she comes in 7/7.

## 2012-07-21 ENCOUNTER — Ambulatory Visit (HOSPITAL_COMMUNITY)
Admission: RE | Admit: 2012-07-21 | Discharge: 2012-07-21 | Disposition: A | Discharge status: Home / Self Care | Payer: BC Managed Care – PPO | Source: Ambulatory Visit | Attending: Oncology | Admitting: Oncology

## 2012-07-21 ENCOUNTER — Other Ambulatory Visit (HOSPITAL_BASED_OUTPATIENT_CLINIC_OR_DEPARTMENT_OTHER): Payer: BC Managed Care – PPO | Admitting: Lab

## 2012-07-21 ENCOUNTER — Telehealth: Payer: Self-pay | Admitting: Oncology

## 2012-07-21 DIAGNOSIS — I319 Disease of pericardium, unspecified: Secondary | ICD-10-CM | POA: Insufficient documentation

## 2012-07-21 DIAGNOSIS — R911 Solitary pulmonary nodule: Secondary | ICD-10-CM | POA: Insufficient documentation

## 2012-07-21 DIAGNOSIS — C189 Malignant neoplasm of colon, unspecified: Secondary | ICD-10-CM

## 2012-07-21 DIAGNOSIS — Z87891 Personal history of nicotine dependence: Secondary | ICD-10-CM | POA: Insufficient documentation

## 2012-07-21 DIAGNOSIS — C187 Malignant neoplasm of sigmoid colon: Secondary | ICD-10-CM

## 2012-07-21 DIAGNOSIS — Z9089 Acquired absence of other organs: Secondary | ICD-10-CM | POA: Insufficient documentation

## 2012-07-21 DIAGNOSIS — J438 Other emphysema: Secondary | ICD-10-CM | POA: Insufficient documentation

## 2012-07-21 LAB — COMPREHENSIVE METABOLIC PANEL (CC13)
AST: 27 U/L (ref 5–34)
Albumin: 3.2 g/dL — ABNORMAL LOW (ref 3.5–5.0)
Alkaline Phosphatase: 66 U/L (ref 40–150)
BUN: 8.3 mg/dL (ref 7.0–26.0)
Potassium: 4.4 mEq/L (ref 3.5–5.1)
Sodium: 140 mEq/L (ref 136–145)
Total Bilirubin: 0.2 mg/dL (ref 0.20–1.20)
Total Protein: 7.1 g/dL (ref 6.4–8.3)

## 2012-07-21 LAB — CBC WITH DIFFERENTIAL/PLATELET
EOS%: 8.1 % — ABNORMAL HIGH (ref 0.0–7.0)
LYMPH%: 39.5 % (ref 14.0–49.7)
MCH: 27.1 pg (ref 25.1–34.0)
MCV: 83.3 fL (ref 79.5–101.0)
MONO%: 6.9 % (ref 0.0–14.0)
Platelets: 307 10*3/uL (ref 145–400)
RBC: 4.33 10*6/uL (ref 3.70–5.45)
RDW: 13.7 % (ref 11.2–14.5)

## 2012-07-21 MED ORDER — IOHEXOL 300 MG/ML  SOLN
80.0000 mL | Freq: Once | INTRAMUSCULAR | Status: AC | PRN
  Administered 2012-07-21: 80 mL via INTRAVENOUS

## 2012-07-21 NOTE — Telephone Encounter (Signed)
Talked to pt and gave her appt for chemo class 07/26/12

## 2012-07-25 ENCOUNTER — Other Ambulatory Visit: Payer: Self-pay | Admitting: Oncology

## 2012-07-26 ENCOUNTER — Other Ambulatory Visit: Payer: BC Managed Care – PPO

## 2012-07-26 ENCOUNTER — Other Ambulatory Visit: Payer: Self-pay | Admitting: *Deleted

## 2012-07-26 ENCOUNTER — Encounter: Payer: Self-pay | Admitting: *Deleted

## 2012-07-26 MED ORDER — OXYCODONE-ACETAMINOPHEN 10-325 MG PO TABS: 1.0000 | ORAL_TABLET | ORAL | Status: DC | PRN

## 2012-07-27 ENCOUNTER — Encounter (INDEPENDENT_AMBULATORY_CARE_PROVIDER_SITE_OTHER): Payer: BC Managed Care – PPO | Admitting: Surgery

## 2012-07-28 ENCOUNTER — Other Ambulatory Visit: Payer: Self-pay | Admitting: Oncology

## 2012-07-28 ENCOUNTER — Other Ambulatory Visit: Payer: Self-pay | Admitting: Nurse Practitioner

## 2012-07-28 ENCOUNTER — Ambulatory Visit (HOSPITAL_BASED_OUTPATIENT_CLINIC_OR_DEPARTMENT_OTHER): Payer: BC Managed Care – PPO

## 2012-07-28 ENCOUNTER — Encounter: Payer: Self-pay | Admitting: Oncology

## 2012-07-28 VITALS — BP 138/76 | HR 98 | Temp 98.0°F | Resp 18

## 2012-07-28 DIAGNOSIS — Z5111 Encounter for antineoplastic chemotherapy: Secondary | ICD-10-CM

## 2012-07-28 DIAGNOSIS — C187 Malignant neoplasm of sigmoid colon: Secondary | ICD-10-CM

## 2012-07-28 DIAGNOSIS — C189 Malignant neoplasm of colon, unspecified: Secondary | ICD-10-CM

## 2012-07-28 MED ORDER — FLUOROURACIL CHEMO INJECTION 5 GM/100ML
2400.0000 mg/m2 | INTRAVENOUS | Status: DC
  Administered 2012-07-28: 4650 mg via INTRAVENOUS
  Filled 2012-07-28: qty 93

## 2012-07-28 MED ORDER — ALTEPLASE 2 MG IJ SOLR
2.0000 mg | Freq: Once | INTRAMUSCULAR | Status: AC | PRN
  Administered 2012-07-28: 2 mg
  Filled 2012-07-28: qty 2

## 2012-07-28 MED ORDER — SODIUM CHLORIDE 0.9 % IJ SOLN
10.0000 mL | INTRAMUSCULAR | Status: DC | PRN
  Filled 2012-07-28: qty 10

## 2012-07-28 MED ORDER — DEXTROSE 5 % IV SOLN
Freq: Once | INTRAVENOUS | Status: AC
  Administered 2012-07-28: 50 mL via INTRAVENOUS

## 2012-07-28 MED ORDER — LEUCOVORIN CALCIUM INJECTION 350 MG
400.0000 mg/m2 | Freq: Once | INTRAVENOUS | Status: AC
  Administered 2012-07-28: 772 mg via INTRAVENOUS
  Filled 2012-07-28: qty 38.6

## 2012-07-28 MED ORDER — DEXAMETHASONE SODIUM PHOSPHATE 10 MG/ML IJ SOLN
10.0000 mg | Freq: Once | INTRAMUSCULAR | Status: AC
  Administered 2012-07-28: 10 mg via INTRAVENOUS

## 2012-07-28 MED ORDER — OXALIPLATIN CHEMO INJECTION 100 MG/20ML
85.0000 mg/m2 | Freq: Once | INTRAVENOUS | Status: AC
  Administered 2012-07-28: 165 mg via INTRAVENOUS
  Filled 2012-07-28: qty 33

## 2012-07-28 MED ORDER — FLUOROURACIL CHEMO INJECTION 2.5 GM/50ML
400.0000 mg/m2 | Freq: Once | INTRAVENOUS | Status: AC
  Administered 2012-07-28: 750 mg via INTRAVENOUS
  Filled 2012-07-28: qty 15

## 2012-07-28 MED ORDER — ONDANSETRON 8 MG/50ML IVPB (CHCC)
8.0000 mg | Freq: Once | INTRAVENOUS | Status: AC
  Administered 2012-07-28: 8 mg via INTRAVENOUS

## 2012-07-28 MED ORDER — HEPARIN SOD (PORK) LOCK FLUSH 100 UNIT/ML IV SOLN
500.0000 [IU] | Freq: Once | INTRAVENOUS | Status: DC | PRN
  Filled 2012-07-28: qty 5

## 2012-07-28 NOTE — Progress Notes (Signed)
12:30 Pt's PAC accessed. Flushes easily. No blood return. Pt placed in multiple positions,coughed; no blood return. 2 mg Cath Flo Activase instilled in PAC per protocol at 1345. 1500 10 ml blood/cath flo withdrawn from [facility]. Excellent blood return and flushes easily.

## 2012-07-28 NOTE — Patient Instructions (Addendum)
[facility] Discharge Instructions for Patients Receiving Chemotherapy  Today you received the following chemotherapy agents Oxaliplatin,Leucovorin,5FU  To help prevent nausea and vomiting after your treatment, we encourage you to take your nausea medication as prescribed..   If you develop nausea and vomiting that is not controlled by your nausea medication, call the clinic. If it is after clinic hours your family physician or the after hours number for the clinic or go to the Emergency Department.   BELOW ARE SYMPTOMS THAT SHOULD BE REPORTED IMMEDIATELY:  *FEVER GREATER THAN 100.5 F  *CHILLS WITH OR WITHOUT FEVER  NAUSEA AND VOMITING THAT IS NOT CONTROLLED WITH YOUR NAUSEA MEDICATION  *UNUSUAL SHORTNESS OF BREATH  *UNUSUAL BRUISING OR BLEEDING  TENDERNESS IN MOUTH AND THROAT WITH OR WITHOUT PRESENCE OF ULCERS  *URINARY PROBLEMS  *BOWEL PROBLEMS  UNUSUAL RASH Items with * indicate a potential emergency and should be followed up as soon as possible.  One of the nurses will contact you 24 hours after your treatment. Please let the nurse know about any problems that you may have experienced. Feel free to call the clinic you have any questions or concerns. The clinic phone number is [phone].   I have been informed and understand all the instructions given to me. I know to contact the clinic, my physician, or go to the Emergency Department if any problems should occur. I do not have any questions at this time, but understand that I may call the clinic during office hours   should I have any questions or need assistance in obtaining follow up care.   Leucovorin injection What is this medicine? LEUCOVORIN (loo koe VOR in) is used to prevent or treat the harmful effects of some medicines. This medicine is used to treat anemia caused by a low amount of folic acid in the body. It is also used with 5-fluorouracil (5-FU) to treat colon cancer. This medicine may be  used for other purposes; ask your health care provider or pharmacist if you have questions. What should I tell my health care provider before I take this medicine? They need to know if you have any of these conditions: -anemia from low levels of vitamin B-12 in the blood -an unusual or allergic reaction to leucovorin, folic acid, other medicines, foods, dyes, or preservatives -pregnant or trying to get pregnant -breast-feeding How should I use this medicine? This medicine is for injection into a muscle or into a vein. It is given by a health care professional in a hospital or clinic setting. Talk to your pediatrician regarding the use of this medicine in children. Special care may be needed. Overdosage: If you think you have taken too much of this medicine contact a poison control center or emergency room at once. NOTE: This medicine is only for you. Do not share this medicine with others. What if I miss a dose? This does not apply. What may interact with this medicine? -capecitabine -fluorouracil -phenobarbital -phenytoin -primidone -trimethoprim-sulfamethoxazole This list may not describe all possible interactions. Give your health care provider a list of all the medicines, herbs, non-prescription drugs, or dietary supplements you use. Also tell them if you smoke, drink alcohol, or use illegal drugs. Some items may interact with your medicine. What should I watch for while using this medicine? Your condition will be monitored carefully while you are receiving this medicine. This medicine may increase the side effects of 5-fluorouracil, 5-FU. Tell your doctor or health care professional if you have diarrhea  or mouth sores that do not get better or that get worse. What side effects may I notice from receiving this medicine? Side effects that you should report to your doctor or health care professional as soon as possible: -allergic reactions like skin rash, itching or hives, swelling of  the face, lips, or tongue -breathing problems -fever, infection -mouth sores -unusual bleeding or bruising -unusually weak or tired Side effects that usually do not require medical attention (report to your doctor or health care professional if they continue or are bothersome): -constipation or diarrhea -loss of appetite -nausea, vomiting This list may not describe all possible side effects. Call your doctor for medical advice about side effects. You may report side effects to FDA at 1-800-FDA-1088. Where should I keep my medicine? This drug is given in a hospital or clinic and will not be stored at home. NOTE: This sheet is a summary. It may not cover all possible information. If you have questions about this medicine, talk to your doctor, pharmacist, or health care provider.   2012, Elsevier/Gold Standard. (08/23/2007 4:50:29 PM)Oxaliplatin Injection What is this medicine? OXALIPLATIN (ox AL i PLA tin) is a chemotherapy drug. It targets fast dividing cells, like cancer cells, and causes these cells to die. This medicine is used to treat cancers of the colon and rectum, and many other cancers. This medicine may be used for other purposes; ask your health care provider or pharmacist if you have questions. What should I tell my health care provider before I take this medicine? They need to know if you have any of these conditions: -kidney disease -an unusual or allergic reaction to oxaliplatin, other chemotherapy, other medicines, foods, dyes, or preservatives -pregnant or trying to get pregnant -breast-feeding How should I use this medicine? This drug is given as an infusion into a vein. It is administered in a hospital or clinic by a specially trained health care professional. Talk to your pediatrician regarding the use of this medicine in children. Special care may be needed. Overdosage: If you think you have taken too much of this medicine contact a poison control center or emergency  room at once. NOTE: This medicine is only for you. Do not share this medicine with others. What if I miss a dose? It is important not to miss a dose. Call your doctor or health care professional if you are unable to keep an appointment. What may interact with this medicine? -medicines to increase blood counts like filgrastim, pegfilgrastim, sargramostim -probenecid -some antibiotics like amikacin, gentamicin, neomycin, polymyxin B, streptomycin, tobramycin -zalcitabine Talk to your doctor or health care professional before taking any of these medicines: -acetaminophen -aspirin -ibuprofen -ketoprofen -naproxen This list may not describe all possible interactions. Give your health care provider a list of all the medicines, herbs, non-prescription drugs, or dietary supplements you use. Also tell them if you smoke, drink alcohol, or use illegal drugs. Some items may interact with your medicine. What should I watch for while using this medicine? Your condition will be monitored carefully while you are receiving this medicine. You will need important blood work done while you are taking this medicine. This medicine can make you more sensitive to cold. Do not drink cold drinks or use ice. Cover exposed skin before coming in contact with cold temperatures or cold objects. When out in cold weather wear warm clothing and cover your mouth and nose to warm the air that goes into your lungs. Tell your doctor if you get sensitive to the  cold. This drug may make you feel generally unwell. This is not uncommon, as chemotherapy can affect healthy cells as well as cancer cells. Report any side effects. Continue your course of treatment even though you feel ill unless your doctor tells you to stop. In some cases, you may be given additional medicines to help with side effects. Follow all directions for their use. Call your doctor or health care professional for advice if you get a fever, chills or sore throat, or  other symptoms of a cold or flu. Do not treat yourself. This drug decreases your body's ability to fight infections. Try to avoid being around people who are sick. This medicine may increase your risk to bruise or bleed. Call your doctor or health care professional if you notice any unusual bleeding. Be careful brushing and flossing your teeth or using a toothpick because you may get an infection or bleed more easily. If you have any dental work done, tell your dentist you are receiving this medicine. Avoid taking products that contain aspirin, acetaminophen, ibuprofen, naproxen, or ketoprofen unless instructed by your doctor. These medicines may hide a fever. Do not become pregnant while taking this medicine. Women should inform their doctor if they wish to become pregnant or think they might be pregnant. There is a potential for serious side effects to an unborn child. Talk to your health care professional or pharmacist for more information. Do not breast-feed an infant while taking this medicine. Call your doctor or health care professional if you get diarrhea. Do not treat yourself. What side effects may I notice from receiving this medicine? Side effects that you should report to your doctor or health care professional as soon as possible: -allergic reactions like skin rash, itching or hives, swelling of the face, lips, or tongue -low blood counts - This drug may decrease the number of white blood cells, red blood cells and platelets. You may be at increased risk for infections and bleeding. -signs of infection - fever or chills, cough, sore throat, pain or difficulty passing urine -signs of decreased platelets or bleeding - bruising, pinpoint red spots on the skin, black, tarry stools, nosebleeds -signs of decreased red blood cells - unusually weak or tired, fainting spells, lightheadedness -breathing problems -chest pain, pressure -cough -diarrhea -jaw tightness -mouth sores -nausea and  vomiting -pain, swelling, redness or irritation at the injection site -pain, tingling, numbness in the hands or feet -problems with balance, talking, walking -redness, blistering, peeling or loosening of the skin, including inside the mouth -trouble passing urine or change in the amount of urine Side effects that usually do not require medical attention (report to your doctor or health care professional if they continue or are bothersome): -changes in vision -constipation -hair loss -loss of appetite -metallic taste in the mouth or changes in taste -stomach pain This list may not describe all possible side effects. Call your doctor for medical advice about side effects. You may report side effects to FDA at 1-800-FDA-1088. Where should I keep my medicine? This drug is given in a hospital or clinic and will not be stored at home. NOTE: This sheet is a summary. It may not cover all possible information. If you have questions about this medicine, talk to your doctor, pharmacist, or health care provider.    2012, Elsevier/Gold Standard. (09/13/2007 5:22:47 PM)Fluorouracil, 5-FU injection What is this medicine? FLUOROURACIL, 5-FU (flure oh YOOR a sil) is a chemotherapy drug. It slows the growth of cancer cells. This medicine  is used to treat many types of cancer like breast cancer, colon or rectal cancer, pancreatic cancer, and stomach cancer. This medicine may be used for other purposes; ask your health care provider or pharmacist if you have questions. What should I tell my health care provider before I take this medicine? They need to know if you have any of these conditions: -blood disorders -dihydropyrimidine dehydrogenase (DPD) deficiency -infection (especially a virus infection such as chickenpox, cold sores, or herpes) -kidney disease -liver disease -malnourished, poor nutrition -recent or ongoing radiation therapy -an unusual or allergic reaction to fluorouracil, other  chemotherapy, other medicines, foods, dyes, or preservatives -pregnant or trying to get pregnant -breast-feeding How should I use this medicine? This drug is given as an infusion or injection into a vein. It is administered in a hospital or clinic by a specially trained health care professional. Talk to your pediatrician regarding the use of this medicine in children. Special care may be needed. Overdosage: If you think you have taken too much of this medicine contact a poison control center or emergency room at once. NOTE: This medicine is only for you. Do not share this medicine with others. What if I miss a dose? It is important not to miss your dose. Call your doctor or health care professional if you are unable to keep an appointment. What may interact with this medicine? -allopurinol -cimetidine -dapsone -digoxin -hydroxyurea -leucovorin -levamisole -medicines for seizures like ethotoin, fosphenytoin, phenytoin -medicines to increase blood counts like filgrastim, pegfilgrastim, sargramostim -medicines that treat or prevent blood clots like warfarin, enoxaparin, and dalteparin -methotrexate -metronidazole -pyrimethamine -some other chemotherapy drugs like busulfan, cisplatin, estramustine, vinblastine -trimethoprim -trimetrexate -vaccines Talk to your doctor or health care professional before taking any of these medicines: -acetaminophen -aspirin -ibuprofen -ketoprofen -naproxen This list may not describe all possible interactions. Give your health care provider a list of all the medicines, herbs, non-prescription drugs, or dietary supplements you use. Also tell them if you smoke, drink alcohol, or use illegal drugs. Some items may interact with your medicine. What should I watch for while using this medicine? Visit your doctor for checks on your progress. This drug may make you feel generally unwell. This is not uncommon, as chemotherapy can affect healthy cells as well as  cancer cells. Report any side effects. Continue your course of treatment even though you feel ill unless your doctor tells you to stop. In some cases, you may be given additional medicines to help with side effects. Follow all directions for their use. Call your doctor or health care professional for advice if you get a fever, chills or sore throat, or other symptoms of a cold or flu. Do not treat yourself. This drug decreases your body's ability to fight infections. Try to avoid being around people who are sick. This medicine may increase your risk to bruise or bleed. Call your doctor or health care professional if you notice any unusual bleeding. Be careful brushing and flossing your teeth or using a toothpick because you may get an infection or bleed more easily. If you have any dental work done, tell your dentist you are receiving this medicine. Avoid taking products that contain aspirin, acetaminophen, ibuprofen, naproxen, or ketoprofen unless instructed by your doctor. These medicines may hide a fever. Do not become pregnant while taking this medicine. Women should inform their doctor if they wish to become pregnant or think they might be pregnant. There is a potential for serious side effects to an unborn child.  Talk to your health care professional or pharmacist for more information. Do not breast-feed an infant while taking this medicine. Men should inform their doctor if they wish to father a child. This medicine may lower sperm counts. Do not treat diarrhea with over the counter products. Contact your doctor if you have diarrhea that lasts more than 2 days or if it is severe and watery. This medicine can make you more sensitive to the sun. Keep out of the sun. If you cannot avoid being in the sun, wear protective clothing and use sunscreen. Do not use sun lamps or tanning beds/booths. What side effects may I notice from receiving this medicine? Side effects that you should report to your doctor  or health care professional as soon as possible: -allergic reactions like skin rash, itching or hives, swelling of the face, lips, or tongue -low blood counts - this medicine may decrease the number of white blood cells, red blood cells and platelets. You may be at increased risk for infections and bleeding. -signs of infection - fever or chills, cough, sore throat, pain or difficulty passing urine -signs of decreased platelets or bleeding - bruising, pinpoint red spots on the skin, black, tarry stools, blood in the urine -signs of decreased red blood cells - unusually weak or tired, fainting spells, lightheadedness -breathing problems -changes in vision -chest pain -mouth sores -nausea and vomiting -pain, swelling, redness at site where injected -pain, tingling, numbness in the hands or feet -redness, swelling, or sores on hands or feet -stomach pain -unusual bleeding Side effects that usually do not require medical attention (report to your doctor or health care professional if they continue or are bothersome): -changes in finger or toe nails -diarrhea -dry or itchy skin -hair loss -headache -loss of appetite -sensitivity of eyes to the light -stomach upset -unusually teary eyes This list may not describe all possible side effects. Call your doctor for medical advice about side effects. You may report side effects to FDA at 1-800-FDA-1088. Where should I keep my medicine? This drug is given in a hospital or clinic and will not be stored at home. NOTE: This sheet is a summary. It may not cover all possible information. If you have questions about this medicine, talk to your doctor, pharmacist, or health care provider.  2012, Elsevier/Gold Standard. (06/22/2007 1:53:16 PM)

## 2012-07-28 NOTE — Progress Notes (Addendum)
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  Taking compazine which is not helping.  Pain issues, if any:  Generalized pain rating at a 9/10.  Taking 1 Percocet every 4 hours which is not helping.  SAFETY ISSUES:  Prior radiation? no  Pacemaker/ICD? no  Possible current pregnancy? no  Is the patient on methotrexate? no  Current Complaints / other details:  Sheri Calderon here with her daughter for consult.  She states she feels sick and "like she was hit by a truck."  She has been taking Percocet.  She has been up all night vomiting.  She vomited once before she was seen today.  She did take compazine this morning.  She started chemo last Thursday.  She has been crying on an off during the visit.

## 2012-07-29 ENCOUNTER — Encounter: Payer: Self-pay | Admitting: *Deleted

## 2012-07-29 ENCOUNTER — Telehealth: Payer: Self-pay | Admitting: *Deleted

## 2012-07-29 NOTE — Telephone Encounter (Signed)
Message copied by Cherylynn Ridges on Fri Jul 29, 2012  1:19 PM ------      Message from: Effie Berkshire      Created: Thu Jul 28, 2012  5:00 PM      Regarding: chemo follow up      Contact: [phone]       Pt in today 07/28/12 for first Folfox treatment. Please follow up with patient. ------

## 2012-07-29 NOTE — Telephone Encounter (Signed)
Sheri Calderon is doing well after starting FOLFOX.  Denies any side effects or symptoms at this time.  No problems with use of pump.

## 2012-07-30 ENCOUNTER — Ambulatory Visit (HOSPITAL_BASED_OUTPATIENT_CLINIC_OR_DEPARTMENT_OTHER): Payer: BC Managed Care – PPO

## 2012-07-30 DIAGNOSIS — Z452 Encounter for adjustment and management of vascular access device: Secondary | ICD-10-CM

## 2012-07-30 DIAGNOSIS — C187 Malignant neoplasm of sigmoid colon: Secondary | ICD-10-CM

## 2012-08-01 ENCOUNTER — Other Ambulatory Visit: Payer: Self-pay | Admitting: *Deleted

## 2012-08-01 ENCOUNTER — Ambulatory Visit (HOSPITAL_BASED_OUTPATIENT_CLINIC_OR_DEPARTMENT_OTHER): Payer: 59 | Admitting: Physician Assistant

## 2012-08-01 ENCOUNTER — Ambulatory Visit (HOSPITAL_BASED_OUTPATIENT_CLINIC_OR_DEPARTMENT_OTHER): Payer: 59

## 2012-08-01 ENCOUNTER — Ambulatory Visit
Admission: RE | Admit: 2012-08-01 | Discharge: 2012-08-01 | Disposition: A | Discharge status: Home / Self Care | Payer: 59 | Source: Ambulatory Visit | Attending: Radiation Oncology | Admitting: Radiation Oncology

## 2012-08-01 ENCOUNTER — Telehealth: Payer: Self-pay | Admitting: *Deleted

## 2012-08-01 ENCOUNTER — Encounter: Payer: BC Managed Care – PPO | Admitting: Nutrition

## 2012-08-01 ENCOUNTER — Encounter: Payer: Self-pay | Admitting: Nutrition

## 2012-08-01 ENCOUNTER — Ambulatory Visit (HOSPITAL_BASED_OUTPATIENT_CLINIC_OR_DEPARTMENT_OTHER): Payer: 59 | Admitting: Lab

## 2012-08-01 VITALS — BP 141/94 | HR 100 | Temp 97.7°F | Ht 67.0 in | Wt 176.0 lb

## 2012-08-01 VITALS — BP 160/105 | HR 117 | Temp 98.0°F | Resp 18 | Ht 67.0 in | Wt 175.2 lb

## 2012-08-01 DIAGNOSIS — K5732 Diverticulitis of large intestine without perforation or abscess without bleeding: Secondary | ICD-10-CM

## 2012-08-01 DIAGNOSIS — R911 Solitary pulmonary nodule: Secondary | ICD-10-CM | POA: Insufficient documentation

## 2012-08-01 DIAGNOSIS — C189 Malignant neoplasm of colon, unspecified: Secondary | ICD-10-CM

## 2012-08-01 DIAGNOSIS — R112 Nausea with vomiting, unspecified: Secondary | ICD-10-CM

## 2012-08-01 DIAGNOSIS — I319 Disease of pericardium, unspecified: Secondary | ICD-10-CM | POA: Insufficient documentation

## 2012-08-01 DIAGNOSIS — C187 Malignant neoplasm of sigmoid colon: Secondary | ICD-10-CM | POA: Insufficient documentation

## 2012-08-01 DIAGNOSIS — J438 Other emphysema: Secondary | ICD-10-CM | POA: Insufficient documentation

## 2012-08-01 DIAGNOSIS — R109 Unspecified abdominal pain: Secondary | ICD-10-CM

## 2012-08-01 DIAGNOSIS — C779 Secondary and unspecified malignant neoplasm of lymph node, unspecified: Secondary | ICD-10-CM | POA: Insufficient documentation

## 2012-08-01 DIAGNOSIS — K5730 Diverticulosis of large intestine without perforation or abscess without bleeding: Secondary | ICD-10-CM | POA: Insufficient documentation

## 2012-08-01 DIAGNOSIS — Z79899 Other long term (current) drug therapy: Secondary | ICD-10-CM | POA: Insufficient documentation

## 2012-08-01 DIAGNOSIS — Z87891 Personal history of nicotine dependence: Secondary | ICD-10-CM | POA: Insufficient documentation

## 2012-08-01 DIAGNOSIS — Z5189 Encounter for other specified aftercare: Secondary | ICD-10-CM

## 2012-08-01 DIAGNOSIS — Z9049 Acquired absence of other specified parts of digestive tract: Secondary | ICD-10-CM | POA: Insufficient documentation

## 2012-08-01 DIAGNOSIS — K5792 Diverticulitis of intestine, part unspecified, without perforation or abscess without bleeding: Secondary | ICD-10-CM

## 2012-08-01 DIAGNOSIS — IMO0002 Reserved for concepts with insufficient information to code with codable children: Secondary | ICD-10-CM | POA: Insufficient documentation

## 2012-08-01 LAB — CBC WITH DIFFERENTIAL/PLATELET
BASO%: 1.1 % (ref 0.0–2.0)
EOS%: 5.9 % (ref 0.0–7.0)
MCH: 27.6 pg (ref 25.1–34.0)
MCHC: 33.5 g/dL (ref 31.5–36.0)
MONO#: 0.1 10*3/uL (ref 0.1–0.9)
RBC: 4.29 10*6/uL (ref 3.70–5.45)
RDW: 13.6 % (ref 11.2–14.5)
WBC: 3.9 10*3/uL (ref 3.9–10.3)
lymph#: 1.7 10*3/uL (ref 0.9–3.3)

## 2012-08-01 MED ORDER — DEXAMETHASONE 4 MG PO TABS: 4.0000 mg | ORAL_TABLET | Freq: Two times a day (BID) | ORAL | Status: DC

## 2012-08-01 MED ORDER — OXYCODONE-ACETAMINOPHEN 10-325 MG PO TABS: 1.0000 | ORAL_TABLET | ORAL | Status: DC | PRN

## 2012-08-01 MED ORDER — SODIUM CHLORIDE 0.9 % IV SOLN
1000.0000 mL | INTRAVENOUS | Status: DC
  Administered 2012-08-01: 1000 mL via INTRAVENOUS

## 2012-08-01 MED ORDER — ONDANSETRON HCL 8 MG PO TABS: 8.0000 mg | ORAL_TABLET | Freq: Three times a day (TID) | ORAL | Status: DC | PRN

## 2012-08-01 MED ORDER — DEXAMETHASONE SODIUM PHOSPHATE 10 MG/ML IJ SOLN
10.0000 mg | Freq: Once | INTRAMUSCULAR | Status: AC
  Administered 2012-08-01: 10 mg via INTRAVENOUS

## 2012-08-01 MED ORDER — LORAZEPAM 0.5 MG PO TABS: 0.5000 mg | ORAL_TABLET | Freq: Four times a day (QID) | ORAL | Status: DC | PRN

## 2012-08-01 MED ORDER — ONDANSETRON 8 MG/50ML IVPB (CHCC)
8.0000 mg | Freq: Once | INTRAVENOUS | Status: AC
  Administered 2012-08-01: 8 mg via INTRAVENOUS

## 2012-08-01 NOTE — Patient Instructions (Addendum)
Dehydration, Adult  Dehydration means your body does not have as much fluid as it needs. Your kidneys, brain, and heart will not work properly without the right amount of fluids and salt.   HOME CARE   Ask your doctor how to replace body fluid losses (rehydrate).   Drink enough fluids to keep your pee (urine) clear or pale yellow.   Drink small amounts of fluids often if you feel sick to your stomach (nauseous) or throw up (vomit).   Eat like you normally do.   Avoid:   Foods or drinks high in sugar.   Bubbly (carbonated) drinks.   Juice.   Very hot or cold fluids.   Drinks with caffeine.   Fatty, greasy foods.   Alcohol.   Tobacco.   Eating too much.   Gelatin desserts.   Wash your hands to avoid spreading germs (bacteria, viruses).   Only take medicine as told by your doctor.   Keep all doctor visits as told.  GET HELP RIGHT AWAY IF:    You cannot drink something without throwing up.   You get worse even with treatment.   Your vomit has blood in it or looks greenish.   Your poop (stool) has blood in it or looks black and tarry.   You have not peed in 6 to 8 hours.   You pee a small amount of very dark pee.   You have a fever.   You pass out (faint).   You have belly (abdominal) pain that gets worse or stays in one spot (localizes).   You have a rash, stiff neck, or bad headache.   You get easily annoyed, sleepy, or are hard to wake up.   You feel weak, dizzy, or very thirsty.  MAKE SURE YOU:    Understand these instructions.   Will watch your condition.   Will get help right away if you are not doing well or get worse.  Document Released: 12/13/2008 Document Revised: 05/11/2011 Document Reviewed: 10/06/2010  [employer] Patient Information 2014 [employer].

## 2012-08-01 NOTE — Telephone Encounter (Signed)
Received phone call from patient stating she has been throwing up every day since her chemo treatment.  She stated she threw up 10 times last night.  She stated she has been taking the Compazine as prescribed and that it does not help.  She stated that she has been able to take at least 32 oz fluid per day.  She stated she is here now at [facility] seeing Dr. Lisbeth Renshaw.  Dr. Gearldine Shown nurse, Manuela Schwartz, was given this message.

## 2012-08-01 NOTE — Progress Notes (Signed)
Called in script for Lorazepam 0.5 mg every 6 hours prn nausea and Zofran 8 mg every 8 hours prn nausea to [facility].

## 2012-08-01 NOTE — Progress Notes (Signed)
Patient had a 12:00 nutrition appointment scheduled. She did not show up for scheduled appointment nor did she cancel. I have rescheduled patient for nutrition consult during chemotherapy on Thursday, June 12.

## 2012-08-01 NOTE — Progress Notes (Signed)
Please see the Nurse Progress Note in the MD Initial Consult Encounter for this patient. 

## 2012-08-01 NOTE — Patient Instructions (Addendum)
Be sure to eat a well balanced diet Follow up with Dr. Benay Spice as scheduled on 08/11/12

## 2012-08-01 NOTE — Progress Notes (Signed)
Walk in today with c/o pain and body aches all over.  vOMITING STARTED YESTERDAY AND HAS VOMITED TEN TIMES OVER NIGHT.  LBM WAS YESTERDAY AND NORMAL.  DRINKING 32 OZ FLUID AND HAS NOT EATEN TODAY.  DR. Benay Spice NOTIFIED, WANTS PATIENT SEEN.  WILL SCHEDULE TO SEE ADRENA JOHNSON PA-C.

## 2012-08-02 NOTE — Progress Notes (Signed)
No images are attached to the encounter. No scans are attached to the encounter. No scans are attached to the encounter. [place] OFFICE PROGRESS NOTE  No PCP Per Patient [place] [address]  DIAGNOSIS: Stage IIIc (T4, N2) moderately differentiated adenocarcinoma of the sigmoid colon, positive for a K-ras mutation  PRIOR THERAPY: Status post sigmoid colectomy on 06/28/2012. The tumor is associated with bowel perforation. Tumor focally involved a radiosurgical margin  CURRENT THERAPY: Adjuvant chemotherapy with FOLFOX  INTERVAL HISTORY: Sheri Calderon 47 y.o. female presents for work in visit for complaints of persistent nausea and vomiting. She received her first cycle of adjuvant FOLFOX on 07/28/2012 with pump discontinuation on 07/30/2012. She states that later that day she experienced nausea and vomiting without significant relief from her Compazine. She also continues to have pain related to her surgery and requests a refill for her Percocet. She is been taking Percocet and ibuprofen alternate leg for pain. She has been drinking primarily red Crystal light as well as ingesting primarily carrots and celery. She denied fever or chills. She's not had any problems with diarrhea or constipation.  MEDICAL HISTORY: Past Medical History  Diagnosis Date  . Lower extremity edema   . Restless legs syndrome   . Diverticulitis     "this is my 2nd time in hospital w/this in the last 2 wk" (06/21/2012)  . Atrial fibrillation     resolved - does not see cardiologist  . Colon cancer     ALLERGIES:  is allergic to lorazepam.  MEDICATIONS:  Current Outpatient Prescriptions  Medication Sig Dispense Refill  . ALPRAZolam (XANAX) 0.5 MG tablet Take 1 tablet (0.5 mg total) by mouth 2 (two) times daily as needed for sleep or anxiety.  60 tablet  1  . dexamethasone (DECADRON) 4 MG tablet Take 1 tablet (4 mg total) by mouth 2 (two) times daily with a meal.  8 tablet  1  .  lidocaine-prilocaine (EMLA) cream Apply topically as needed. Apply 1 teaspoon to PAC site 1-2 hours prior to stick and cover with plastic wrap to numb site  30 g  prn  . LORazepam (ATIVAN) 0.5 MG tablet Take 1 tablet (0.5 mg total) by mouth every 6 (six) hours as needed (nausea).  60 tablet  0  . ondansetron (ZOFRAN) 8 MG tablet Take 1 tablet (8 mg total) by mouth every 8 (eight) hours as needed for nausea.  20 tablet  1  . oxyCODONE-acetaminophen (PERCOCET) 10-325 MG per tablet Take 1 tablet by mouth every 4 (four) hours as needed for pain.  30 tablet  0  . prochlorperazine (COMPAZINE) 10 MG tablet Take 1 tablet (10 mg total) by mouth every 6 (six) hours as needed (nausea).  60 tablet  1   Current Facility-Administered Medications  Medication Dose Route Frequency Provider Last Rate Last Dose  . 0.9 %  sodium chloride infusion  1,000 mL Intravenous Continuous Carlton Adam, PA-C   1,000 mL at 08/01/12 1310    SURGICAL HISTORY:  Past Surgical History  Procedure Laterality Date  . Tubal ligation  ~ 1989  . Partial colectomy N/A 06/28/2012    Procedure: PARTIAL COLECTOMY;  Surgeon: Harl Bowie, MD;  Location: [facility];  Service: General;  Laterality: N/A;  . Portacath placement N/A 07/11/2012    Procedure: INSERTION PORT-A-CATH;  Surgeon: Harl Bowie, MD;  Location: [facility];  Service: General;  Laterality: N/A;    REVIEW OF SYSTEMS:  A comprehensive review of systems was negative except for: Constitutional: positive for fatigue Gastrointestinal: positive for nausea and vomiting   PHYSICAL EXAMINATION: General appearance: alert, cooperative, appears stated age and no distress Head: Normocephalic, without obvious abnormality, atraumatic Neck: no adenopathy, no carotid bruit, no JVD, supple, symmetrical, trachea midline and thyroid not enlarged, symmetric, no tenderness/mass/nodules Lymph nodes: Cervical, supraclavicular, and axillary nodes normal. Resp: clear to auscultation  bilaterally Cardio: regular rate and rhythm, S1, S2 normal, no murmur, click, rub or gallop GI: Abdomen generally soft with normoactive bowel sounds in all quadrants, mild right lower quadrant abdominal without rebound or referred pain Extremities: extremities normal, atraumatic, no cyanosis or edema Neurologic: Alert and oriented X 3, normal strength and tone. Normal symmetric reflexes. Normal coordination and gait  ECOG PERFORMANCE STATUS: 1 - Symptomatic but completely ambulatory  Blood pressure 160/105, pulse 117, temperature 98 F (36.7 C), temperature source Oral, resp. rate 18, height 5' 7"  (1.702 m), weight 175 lb 3.2 oz (79.47 kg), last menstrual period 06/30/2012, SpO2 95.00%.  LABORATORY DATA: Lab Results  Component Value Date   WBC 3.9 08/01/2012   HGB 11.8 08/01/2012   HCT 35.4 08/01/2012   MCV 82.4 08/01/2012   PLT 305 08/01/2012      Chemistry      Component Value Date/Time   NA 140 07/21/2012 1005   NA 137 07/03/2012 0625   K 4.4 07/21/2012 1005   K 3.4* 07/03/2012 0625   CL 108* 07/21/2012 1005   CL 102 07/03/2012 0625   CO2 24 07/21/2012 1005   CO2 29 07/03/2012 0625   BUN 8.3 07/21/2012 1005   BUN <3* 07/03/2012 0625   CREATININE 0.9 07/21/2012 1005   CREATININE 0.68 07/03/2012 0625      Component Value Date/Time   CALCIUM 9.1 07/21/2012 1005   CALCIUM 8.7 07/03/2012 0625   ALKPHOS 66 07/21/2012 1005   ALKPHOS 63 06/07/2012 1938   AST 27 07/21/2012 1005   AST 10 06/07/2012 1938   ALT 37 07/21/2012 1005   ALT 9 06/07/2012 1938   BILITOT 0.20 07/21/2012 1005   BILITOT 0.3 06/07/2012 1938       RADIOGRAPHIC STUDIES:  Ct Chest W Contrast  07/21/2012   *RADIOLOGY REPORT*  Clinical Data: Colon cancer diagnosed 04/14.  Partial colon resection.  Appendectomy.  Chemotherapy to start.  Quit smoking earlier this year.  CT CHEST WITH CONTRAST  Technique:  Multidetector CT imaging of the chest was performed following the standard protocol during bolus administration of intravenous contrast.   Contrast: 82m OMNIPAQUE IOHEXOL 300 MG/ML  SOLN  Comparison: Plain film 07/11/2012.  Chest CT 06/01/2009.  Findings: Lungs/pleura: Mild to moderate centrilobular emphysema. Mild scarring anterior right upper lobe. Bibasilar scarring as well. A ground-glass opacity which measures 5 mm on image 17/series 5 is favored to have been present on 06/01/2009 (image 17 of that exam).No pleural fluid.  Heart/Mediastinum: No supraclavicular adenopathy.  Normal heart size.  Small volume pericardial effusion which is new since 06/26/2012.  No mediastinal or hilar adenopathy.  Left-sided Port-A- Cath which terminates at the mid SVC.  Suspect a small amount of thrombus along the course of the catheter, along the lateral SVC wall on image 22/series 2. No central pulmonary embolism, on this non-dedicated study.  Upper abdomen: No significant findings.  Bones/Musculoskeletal:  No acute osseous abnormality.  IMPRESSION:  1. No acute process or evidence of metastatic disease in the chest. 2.  Left upper lobe 5 mm ground-glass nodule is favored  to be similar to 2011, most likely represents an area of post infectious or inflammatory scarring. 3.  New small pericardial effusion. 4.  Port-A-Cath in place.  Nonocclusive thrombus suspected along the course of the catheter within the SVC. 5.  Mild to moderate centrilobular emphysema.   Original Report Authenticated By: Abigail Miyamoto, M.D.   Dg Chest Port 1 View  07/11/2012   *RADIOLOGY REPORT*  Clinical Data: Colon cancer, status post left Port-A-Cath insertion  PORTABLE CHEST - 1 VIEW  Comparison: 06/22/2010  Findings: Chronic interstitial markings.  No focal consolidation. No pleural effusion or pneumothorax.  The heart is normal in size.  Left subclavian chest port terminates in the mid SVC.  IMPRESSION: Left subclavian chest port terminates in the mid SVC.  No pneumothorax is seen.   Original Report Authenticated By: Julian Hy, M.D.   Dg C-arm 1-60 Min-no Report  07/11/2012    CLINICAL DATA: port-a cath   C-ARM 1-60 MINUTES  Fluoroscopy was utilized by the requesting physician.  No radiographic  interpretation.      ASSESSMENT:  1.  stage IIIc (T4, N2) moderately differentiated adenocarcinoma of the sigmoid colon, positive for a K-ras mutation. Status post a sigmoid colectomy on 06/28/2012. The tumor associated with bowel perforation. Tumor focally involved the radial surgical margin. 2. Nausea and vomiting related to adjuvant chemotherapy with FOLFOX   PLAN:  1. Patient will be given 1 L of normal saline as well as 10 mg of dexamethasone IV and 8 mg of Zofran IV to address her persistent nausea and vomiting associated with her first cycle of adjuvant FOLFOX. 2. A prescription for dexamethasone 4 mg by mouth twice daily for 2 days after her pump DC totally tablet with 1 refill was sent to her pharmacy of record. His medications taken with food the patient understands these instructions. 3. She will be given Aloxi with her next cycle of chemotherapy. 4. Patient encouraged to increase her by mouth intake both of food and fluids and she will also see Dory Peru our dietitian for further instructions. 5. Patient reviewed with Dr. Benay Spice and she will followup with him as previously scheduled on 08/11/2012.  Wynetta Emery, Aparna Vanderweele E, PA-C     All questions were answered. The patient knows to call the clinic with any problems, questions or concerns. We can certainly see the patient much sooner if necessary.  I spent 20 minutes counseling the patient face to face. The total time spent in the appointment was 30 minutes.

## 2012-08-03 ENCOUNTER — Encounter: Payer: Self-pay | Admitting: *Deleted

## 2012-08-03 NOTE — Progress Notes (Signed)
Clinical Social Work met with patient in radiation oncology.  The patient scored a 10 on distress thermometer.  Ms. Pola states her distress is caused by physical symptoms including pain and nausea. She requested further assistance with applying for SSDI. CSW discussed social security disability benefits with patient. The patient has requested assistance with SSDI application. CSW made referral to [facility] for application.  Patient/family plans to contact CSW with any questions or concerns.  Polo Riley, MSW, [place] Worker [facility] [phone]

## 2012-08-04 DIAGNOSIS — C187 Malignant neoplasm of sigmoid colon: Secondary | ICD-10-CM | POA: Insufficient documentation

## 2012-08-04 NOTE — Progress Notes (Signed)
Radiation Oncology         [phone] ________________________________  Name: Sheri Calderon MRN: [id number]  Date: 08/01/2012  DOB: 05/24/1965  CC:No PCP Per Patient  Ladell Pier, MD     REFERRING PHYSICIAN: Ladell Pier, MD   DIAGNOSIS: The encounter diagnosis was Malignant neoplasm of colon, unspecified site.: Sigmoid colon   HISTORY OF PRESENT ILLNESS::Sheri Calderon is a 47 y.o. female who is seen for an initial consultation visit. The patient initially developed some abdominal pain in April of 2014. She was admitted on 06/08/2012. The patient was diagnosed with sigmoid diverticulitis with a focal contained perforation. The patient was treated with antibiotics and her pain improved. However she subsequently had continued/increased abdominal pain and further imaging was obtained. This included a CT scan of the abdomen and dullness on 06/26/2012. There was an interval decrease in the size of small diverticular abscess located within the pelvis anterior to the sigmoid colon. Persistent wall thickening within a moderate length of adjacent sigmoid colon was noted which was not resulting in obstruction.  The patient continued symptoms and imaging studies led to the patient proceeding with a partial colectomy on 06/28/2012. Extensive diverticulosis was noted within the sigmoid colon. An abscess was noted within the wall of the sigmoid colon. Final pathology revealed invasive adenocarcinoma with lymphovascular invasion identified. Tumor was present at the circumferential margin. The proximal and distal margins were negative. 9 of 10 lymph nodes were positive for metastatic adenocarcinoma.  The patient indicates that she has been recovering from her surgery. She has had some abdominal and lower back pain since her surgery. She has been gaining some weight. The patient was seen by Dr. Benay Spice in medical oncology and has begun chemotherapy. She states that she has not felt especially well over  last couple of days to 2 beginning chemotherapy. I been asked to see the patient today for evaluation for possible adjuvant radiotherapy as well.   PREVIOUS RADIATION THERAPY: No   PAST MEDICAL HISTORY:  has a past medical history of Lower extremity edema; Restless legs syndrome; Diverticulitis; Atrial fibrillation; and Colon cancer.     PAST SURGICAL HISTORY: Past Surgical History  Procedure Laterality Date  . Tubal ligation  ~ 1989  . Partial colectomy N/A 06/28/2012    Procedure: PARTIAL COLECTOMY;  Surgeon: Harl Bowie, MD;  Location: [facility];  Service: General;  Laterality: N/A;  . Portacath placement N/A 07/11/2012    Procedure: INSERTION PORT-A-CATH;  Surgeon: Harl Bowie, MD;  Location: [facility];  Service: General;  Laterality: N/A;     FAMILY HISTORY: family history includes COPD in her father; Cancer in her mother; Diabetes in her mother; Heart disease in her mother; Hyperlipidemia in her father; Hypertension in her father; and Stomach cancer in her mother.   SOCIAL HISTORY:  reports that she quit smoking about 8 weeks ago. Her smoking use included Cigarettes. She has a 15 pack-year smoking history. She has never used smokeless tobacco. She reports that she does not drink alcohol or use illicit drugs.   ALLERGIES: Lorazepam   MEDICATIONS:  Current Outpatient Prescriptions  Medication Sig Dispense Refill  . ALPRAZolam (XANAX) 0.5 MG tablet Take 1 tablet (0.5 mg total) by mouth 2 (two) times daily as needed for sleep or anxiety.  60 tablet  1  . lidocaine-prilocaine (EMLA) cream Apply topically as needed. Apply 1 teaspoon to PAC site 1-2 hours prior to stick and cover with plastic wrap to numb site  30 g  prn  . prochlorperazine (COMPAZINE) 10 MG tablet Take 1 tablet (10 mg total) by mouth every 6 (six) hours as needed (nausea).  60 tablet  1  . dexamethasone (DECADRON) 4 MG tablet Take 1 tablet (4 mg total) by mouth 2 (two) times daily with a meal.  8 tablet  1  .  LORazepam (ATIVAN) 0.5 MG tablet Take 1 tablet (0.5 mg total) by mouth every 6 (six) hours as needed (nausea).  60 tablet  0  . ondansetron (ZOFRAN) 8 MG tablet Take 1 tablet (8 mg total) by mouth every 8 (eight) hours as needed for nausea.  20 tablet  1  . oxyCODONE-acetaminophen (PERCOCET) 10-325 MG per tablet Take 1 tablet by mouth every 4 (four) hours as needed for pain.  30 tablet  0   No current facility-administered medications for this encounter.     REVIEW OF SYSTEMS:  A 15 point review of systems is documented in the electronic medical record. This was obtained by the nursing staff. However, I reviewed this with the patient to discuss relevant findings and make appropriate changes.  Pertinent items are noted in HPI.    PHYSICAL EXAM:  height is 5' 7"  (1.702 m) and weight is 176 lb (79.833 kg). Her temperature is 97.7 F (36.5 C). Her blood pressure is 141/94 and her pulse is 100.   General: Well-developed, in no acute distress HEENT: Normocephalic, atraumatic; oral cavity clear Neck: Supple without any lymphadenopathy Cardiovascular: Regular rate and rhythm Respiratory: Clear to auscultation bilaterally GI: Soft, nontender, normal bowel sounds, well-healed midline surgical incision present, a little tenderness present within the right abdomen in the mid abdominal region Extremities: No edema present Neuro: No focal deficits     LABORATORY DATA:  Lab Results  Component Value Date   WBC 3.9 08/01/2012   HGB 11.8 08/01/2012   HCT 35.4 08/01/2012   MCV 82.4 08/01/2012   PLT 305 08/01/2012   Lab Results  Component Value Date   NA 140 07/21/2012   K 4.4 07/21/2012   CL 108* 07/21/2012   CO2 24 07/21/2012   Lab Results  Component Value Date   ALT 37 07/21/2012   AST 27 07/21/2012   ALKPHOS 66 07/21/2012   BILITOT 0.20 07/21/2012      RADIOGRAPHY: Ct Chest W Contrast  07/21/2012   *RADIOLOGY REPORT*  Clinical Data: Colon cancer diagnosed 04/14.  Partial colon resection.   Appendectomy.  Chemotherapy to start.  Quit smoking earlier this year.  CT CHEST WITH CONTRAST  Technique:  Multidetector CT imaging of the chest was performed following the standard protocol during bolus administration of intravenous contrast.  Contrast: 60m OMNIPAQUE IOHEXOL 300 MG/ML  SOLN  Comparison: Plain film 07/11/2012.  Chest CT 06/01/2009.  Findings: Lungs/pleura: Mild to moderate centrilobular emphysema. Mild scarring anterior right upper lobe. Bibasilar scarring as well. A ground-glass opacity which measures 5 mm on image 17/series 5 is favored to have been present on 06/01/2009 (image 17 of that exam).No pleural fluid.  Heart/Mediastinum: No supraclavicular adenopathy.  Normal heart size.  Small volume pericardial effusion which is new since 06/26/2012.  No mediastinal or hilar adenopathy.  Left-sided Port-A- Cath which terminates at the mid SVC.  Suspect a small amount of thrombus along the course of the catheter, along the lateral SVC wall on image 22/series 2. No central pulmonary embolism, on this non-dedicated study.  Upper abdomen: No significant findings.  Bones/Musculoskeletal:  No acute osseous abnormality.  IMPRESSION:  1.  No acute process or evidence of metastatic disease in the chest. 2.  Left upper lobe 5 mm ground-glass nodule is favored to be similar to 2011, most likely represents an area of post infectious or inflammatory scarring. 3.  New small pericardial effusion. 4.  Port-A-Cath in place.  Nonocclusive thrombus suspected along the course of the catheter within the SVC. 5.  Mild to moderate centrilobular emphysema.   Original Report Authenticated By: Abigail Miyamoto, M.D.   Dg Chest Port 1 View  07/11/2012   *RADIOLOGY REPORT*  Clinical Data: Colon cancer, status post left Port-A-Cath insertion  PORTABLE CHEST - 1 VIEW  Comparison: 06/22/2010  Findings: Chronic interstitial markings.  No focal consolidation. No pleural effusion or pneumothorax.  The heart is normal in size.  Left  subclavian chest port terminates in the mid SVC.  IMPRESSION: Left subclavian chest port terminates in the mid SVC.  No pneumothorax is seen.   Original Report Authenticated By: Julian Hy, M.D.   Dg C-arm 1-60 Min-no Report  07/11/2012   CLINICAL DATA: port-a cath   C-ARM 1-60 MINUTES  Fluoroscopy was utilized by the requesting physician.  No radiographic  interpretation.        IMPRESSION: The patient is status post a partial colectomy for an invasive adenocarcinoma of the sigmoid colon. This corresponded to a pT4b, R1 pN2bM0 tumor with positive margins. No sign of distant disease and the patient has had a CT scan of the chest for further staging.  The patient has begun chemotherapy. She had her case discussed at multidisciplinary GI conference. From reviewing her case, I believe that the patient is at substantial risk for local failure. With the number of positive lymph nodes, certainly the patient is at risk for distant failure also. For this reason, she has begun chemotherapy as her first course of treatment after surgery. We did discuss at conference the possibility of chemoradiotherapy after a couple of cycles of chemotherapy alone. In reviewing her case in greater detail today, I believe that this would be appropriate for her.  I discussed with the patient a typical 5-1/2 week course of treatment in this setting. We discussed the benefit/rationale of this treatment. We also discussed the possible side effects and risks. All of her questions were answered.   PLAN: The patient will proceed with chemotherapy. I will plan to begin radiotherapy after a couple of cycles, likely to be given with concurrent chemotherapy during that time. I will coordinate this with Dr. Benay Spice in medical oncology.    I spent 60 minutes minutes face to face with the patient and more than 50% of that time was spent in counseling and/or coordination of care.    ________________________________   Jodelle Gross, MD, PhD

## 2012-08-07 ENCOUNTER — Other Ambulatory Visit: Payer: Self-pay | Admitting: Oncology

## 2012-08-08 ENCOUNTER — Other Ambulatory Visit: Payer: Self-pay | Admitting: *Deleted

## 2012-08-08 NOTE — Telephone Encounter (Signed)
Message from pt requesting refill on Xanax. Pharmacy would not refill. Returned call to pt, she reports she has been taking more Xanax than prescribed due to difficulty coping with news that she has to have 5 weeks of radiation. Pt tearful and slurring her speech during conversation. Reviewed with Dr. Benay Spice, do not refill early. Pt to take Xanax BID. Called pt with these instructions, she voiced understanding.

## 2012-08-11 ENCOUNTER — Other Ambulatory Visit (HOSPITAL_BASED_OUTPATIENT_CLINIC_OR_DEPARTMENT_OTHER): Payer: BC Managed Care – PPO | Admitting: Lab

## 2012-08-11 ENCOUNTER — Ambulatory Visit (HOSPITAL_BASED_OUTPATIENT_CLINIC_OR_DEPARTMENT_OTHER): Payer: BC Managed Care – PPO

## 2012-08-11 ENCOUNTER — Telehealth: Payer: Self-pay | Admitting: *Deleted

## 2012-08-11 ENCOUNTER — Ambulatory Visit (HOSPITAL_BASED_OUTPATIENT_CLINIC_OR_DEPARTMENT_OTHER): Payer: BC Managed Care – PPO | Admitting: Oncology

## 2012-08-11 ENCOUNTER — Ambulatory Visit: Payer: BC Managed Care – PPO | Admitting: Nutrition

## 2012-08-11 ENCOUNTER — Telehealth: Payer: Self-pay | Admitting: Oncology

## 2012-08-11 VITALS — BP 149/86 | HR 88 | Temp 97.7°F | Resp 18 | Ht 67.0 in | Wt 180.2 lb

## 2012-08-11 DIAGNOSIS — C187 Malignant neoplasm of sigmoid colon: Secondary | ICD-10-CM

## 2012-08-11 DIAGNOSIS — F329 Major depressive disorder, single episode, unspecified: Secondary | ICD-10-CM

## 2012-08-11 DIAGNOSIS — C189 Malignant neoplasm of colon, unspecified: Secondary | ICD-10-CM

## 2012-08-11 DIAGNOSIS — Z5111 Encounter for antineoplastic chemotherapy: Secondary | ICD-10-CM

## 2012-08-11 DIAGNOSIS — Z800 Family history of malignant neoplasm of digestive organs: Secondary | ICD-10-CM

## 2012-08-11 LAB — CBC WITH DIFFERENTIAL/PLATELET
Basophils Absolute: 0 10*3/uL (ref 0.0–0.1)
EOS%: 3.6 % (ref 0.0–7.0)
Eosinophils Absolute: 0.3 10*3/uL (ref 0.0–0.5)
HCT: 34.4 % — ABNORMAL LOW (ref 34.8–46.6)
HGB: 11.9 g/dL (ref 11.6–15.9)
LYMPH%: 16.2 % (ref 14.0–49.7)
MCH: 28.4 pg (ref 25.1–34.0)
MCV: 82 fL (ref 79.5–101.0)
MONO%: 4.5 % (ref 0.0–14.0)
NEUT%: 75.2 % (ref 38.4–76.8)
Platelets: 294 10*3/uL (ref 145–400)
RDW: 14.8 % — ABNORMAL HIGH (ref 11.2–14.5)

## 2012-08-11 LAB — COMPREHENSIVE METABOLIC PANEL (CC13)
AST: 29 U/L (ref 5–34)
Alkaline Phosphatase: 68 U/L (ref 40–150)
BUN: 14.8 mg/dL (ref 7.0–26.0)
Creatinine: 1 mg/dL (ref 0.6–1.1)
Glucose: 95 mg/dl (ref 70–99)
Total Bilirubin: 0.23 mg/dL (ref 0.20–1.20)

## 2012-08-11 MED ORDER — OXALIPLATIN CHEMO INJECTION 100 MG/20ML
85.0000 mg/m2 | Freq: Once | INTRAVENOUS | Status: AC
  Administered 2012-08-11: 165 mg via INTRAVENOUS
  Filled 2012-08-11: qty 33

## 2012-08-11 MED ORDER — OXYCODONE-ACETAMINOPHEN 5-325 MG PO TABS: 1.0000 | ORAL_TABLET | Freq: Four times a day (QID) | ORAL | Status: DC | PRN

## 2012-08-11 MED ORDER — LEUCOVORIN CALCIUM INJECTION 350 MG
400.0000 mg/m2 | Freq: Once | INTRAVENOUS | Status: AC
  Administered 2012-08-11: 772 mg via INTRAVENOUS
  Filled 2012-08-11: qty 38.6

## 2012-08-11 MED ORDER — OXYCODONE-ACETAMINOPHEN 5-325 MG PO TABS: 1.0000 | ORAL_TABLET | ORAL | Status: DC | PRN

## 2012-08-11 MED ORDER — ESCITALOPRAM OXALATE 5 MG PO TABS: 5.0000 mg | ORAL_TABLET | Freq: Every day | ORAL | Status: DC

## 2012-08-11 MED ORDER — DEXTROSE 5 % IV SOLN
Freq: Once | INTRAVENOUS | Status: AC
  Administered 2012-08-11: 12:00:00 via INTRAVENOUS

## 2012-08-11 MED ORDER — OXYCODONE-ACETAMINOPHEN 5-325 MG PO TABS
1.0000 | ORAL_TABLET | Freq: Once | ORAL | Status: AC
  Administered 2012-08-11: 1 via ORAL

## 2012-08-11 MED ORDER — FLUOROURACIL CHEMO INJECTION 2.5 GM/50ML
400.0000 mg/m2 | Freq: Once | INTRAVENOUS | Status: AC
  Administered 2012-08-11: 750 mg via INTRAVENOUS
  Filled 2012-08-11: qty 15

## 2012-08-11 MED ORDER — PALONOSETRON HCL INJECTION 0.25 MG/5ML
0.2500 mg | Freq: Once | INTRAVENOUS | Status: AC
  Administered 2012-08-11: 0.25 mg via INTRAVENOUS

## 2012-08-11 MED ORDER — DEXAMETHASONE SODIUM PHOSPHATE 10 MG/ML IJ SOLN
10.0000 mg | Freq: Once | INTRAMUSCULAR | Status: AC
  Administered 2012-08-11: 10 mg via INTRAVENOUS

## 2012-08-11 MED ORDER — SODIUM CHLORIDE 0.9 % IV SOLN
2400.0000 mg/m2 | INTRAVENOUS | Status: DC
  Administered 2012-08-11: 4650 mg via INTRAVENOUS
  Filled 2012-08-11: qty 93

## 2012-08-11 NOTE — Progress Notes (Signed)
   [facility]    OFFICE PROGRESS NOTE   INTERVAL HISTORY:   Sheri Calderon returns as scheduled. She completed a first cycle of FOLFOX on 07/28/2012. She had nausea/vomiting beginning on day 3 . No mouth sores or diarrhea. Pain in the hands and feet for several days. She had cold sensitivity following chemotherapy. This has resolved. No numbness today. She has noted a nodular area at the left side of the abdominal incision. She feels depressed.  Objective:  Vital signs in last 24 hours:  Blood pressure 149/86, pulse 88, temperature 97.7 F (36.5 C), temperature source Oral, resp. rate 18, height 5' 7"  (1.702 m), weight 180 lb 3.2 oz (81.738 kg).    HEENT: No thrush or ulcer Resp: Lungs clear bilaterally Cardio: Regular rate and rhythm GI: No hepatomegaly, healed midline incision with slight fullness deep to the left side of the upper portion of the incision. No discrete mass. No erythema or fluctuance  Vascular: No leg edema  Skin: Palms and soles without erythema   Portacath/PICC-without erythema  Lab Results:  Lab Results  Component Value Date   WBC 6.9 08/11/2012   HGB 11.9 08/11/2012   HCT 34.4* 08/11/2012   MCV 82.0 08/11/2012   PLT 294 08/11/2012   ANC 5.2   Medications: I have reviewed the patient's current medications.  Assessment/Plan: 1.Stage IIIc (T4,N2) moderately differentiated adenocarcinoma of the sigmoid colon, positive for a K-ras mutation, status post a sigmoid colectomy 06/28/2012. The tumor is associated with a bowel perforation. Tumor focally involved a radial surgical margin.  -Cycle 1 adjuvant FOLFOX 07/28/2012 2. Bowel perforation/diverticulitis secondary to #1  3. History of atrial fibrillation  4. Family history of cancer-the sigmoid colon tumor 06/28/2012 was microsatellite stable and without loss of mismatch repair proteins  5. Status post Port-A-Cath placement 07/11/2012  6. Nausea and vomiting following cycle 1 of FOLFOX, Aloxi  will be added with cycle 2 7. Depression-she will begin a trial of Lexapro   Disposition:  She has completed one cycle of adjuvant FOLFOX. The plan is to proceed with cycle 2 today. She saw Dr. Lisbeth Renshaw and he recommends adjuvant radiation based on the T4 lesion, surgical margin, and bowel perforation. We will complete at least 3 cycles of FOLFOX prior to referring her for concurrent Xeloda and radiation. Sheri Calderon will return for an office visit and chemotherapy in 2 weeks. She knows to contact us if she has significant nausea following this cycle.   Betsy Coder, MD  08/11/2012  3:20 PM

## 2012-08-11 NOTE — Progress Notes (Signed)
This is a 47 year old female patient of Dr. Benay Spice diagnosed with colon cancer status post bowel perforation.   Past medical history includes diverticulosis and atrial fibrillation.  Medications include Xanax, Percocet, and Compazine.  Labs include potassium 3.4 and glucose 107 on May 4.  Height: 67 inches. Weight: 180.2 pounds. Usual body weight: 184 pounds April 10. BMI: 28.22.  Patient reports she did experience nausea however denies nausea today. She is complaining of abdominal pain today after eating fried chicken sandwich and fries. RN is aware. Patient requesting information on what she should be eating during treatment. Patient has actually gained weight, approaching usual body weight. Patient is well-healed from surgery.  Nutrition diagnosis: Food and nutrition related knowledge deficit related to diagnosis of colon cancer and associated treatments as evidenced by no prior need for nutrition related information.  Intervention: I educated patient on the importance of smaller meals/snacks throughout the day including protein with every meal and snack. I have educated her on the importance of weight maintenance. I have reviewed strategies for dealing with nausea. I provided fact sheets for her to take with her today. I also provided my contact information. Questions were answered. Teach back method used.  Monitoring, evaluation, goals: Patient will tolerate adequate calories and protein to promote weight maintenance throughout treatment and lessen nutrition impact symptoms. Nutrition diagnosis resolved.  Next visit: Patient has my contact information and will call me with further questions or concerns.

## 2012-08-11 NOTE — Telephone Encounter (Signed)
gv and printed appt sched and avs for pt....MW added tx

## 2012-08-11 NOTE — Telephone Encounter (Signed)
Per staff message and POF I have scheduled appts.  [name]  

## 2012-08-11 NOTE — Patient Instructions (Addendum)
Dr. Benay Spice has started you on Lexapro 5 mg daily for depression symptoms. This is a low dose-he wants to start low and increase dose slowly as tolerated. It will take couple weeks for medication to take effect. You may not note improvement of symptoms until dose is increased. He just wants to start slowly to prevent adverse reaction that would make you feel worse.        Escitalopram tablets What is this medicine? ESCITALOPRAM (es sye TAL oh pram) is used to treat depression and certain types of anxiety. This medicine may be used for other purposes; ask your health care provider or pharmacist if you have questions. What should I tell my health care provider before I take this medicine? They need to know if you have any of these conditions: -bipolar disorder or a family history of bipolar disorder -diabetes -heart disease -kidney or liver disease -receiving electroconvulsive therapy -seizures (convulsions) -suicidal thoughts, plans, or attempt by you or a family member -an unusual or allergic reaction to escitalopram, the related drug citalopram, other medicines, foods, dyes, or preservatives -pregnant or trying to become pregnant -breast-feeding How should I use this medicine? Take this medicine by mouth with a glass of water. Follow the directions on the prescription label. You can take it with or without food. If it upsets your stomach, take it with food. Take your medicine at regular intervals. Do not take it more often than directed. Do not stop taking this medicine suddenly except upon the advice of your doctor. Stopping this medicine too quickly may cause serious side effects or your condition may worsen. A special MedGuide will be given to you by the pharmacist with each prescription and refill. Be sure to read this information carefully each time. Talk to your pediatrician regarding the use of this medicine in children. Special care may be needed. Overdosage: If you think you have  taken too much of this medicine contact a poison control center or emergency room at once. NOTE: This medicine is only for you. Do not share this medicine with others. What if I miss a dose? If you miss a dose, take it as soon as you can. If it is almost time for your next dose, take only that dose. Do not take double or extra doses. What may interact with this medicine? Do not take this medicine with any of the following medications: -cisapride -citalopram -linezolid -MAOIs like Carbex, Eldepryl, Marplan, Nardil, and Parnate -methylene blue (injected into a vein) -pimozide This medicine may also interact with the following medications: -alcohol -aspirin and aspirin-like medicines -carbamazepine -certain medicines for depression, anxiety, or psychotic disturbances -certain medicines for migraine headache like almotriptan, eletriptan, frovatriptan, naratriptan, rizatriptan, sumatriptan, zolmitriptan -cimetidine -diuretics -fentanyl -furazolidone -isoniazid -ketoconazole -lithium -medicines that treat or prevent blood clots like warfarin, enoxaparin, and dalteparin -medicines for sleep -metoprolol -NSAIDs, medicines for pain and inflammation, like ibuprofen or naproxen -procarbazine -rasagiline -supplements like St. John's wort, kava kava, valerian -tramadol -tryptophan This list may not describe all possible interactions. Give your health care provider a list of all the medicines, herbs, non-prescription drugs, or dietary supplements you use. Also tell them if you smoke, drink alcohol, or use illegal drugs. Some items may interact with your medicine. What should I watch for while using this medicine? Tell your doctor if your symptoms do not get better or if they get worse. Visit your doctor or health care professional for regular checks on your progress. Because it may take several weeks to see  the full effects of this medicine, it is important to continue your treatment as  prescribed by your doctor. Patients and their families should watch out for new or worsening thoughts of suicide or depression. Also watch out for sudden changes in feelings such as feeling anxious, agitated, panicky, irritable, hostile, aggressive, impulsive, severely restless, overly excited and hyperactive, or not being able to sleep. If this happens, especially at the beginning of treatment or after a change in dose, call your health care professional. [name] may get drowsy or dizzy. Do not drive, use machinery, or do anything that needs mental alertness until you know how this medicine affects you. Do not stand or sit up quickly, especially if you are an older patient. This reduces the risk of dizzy or fainting spells. Alcohol may interfere with the effect of this medicine. Avoid alcoholic drinks. Your mouth may get dry. Chewing sugarless gum or sucking hard candy, and drinking plenty of water may help. Contact your doctor if the problem does not go away or is severe. What side effects may I notice from receiving this medicine? Side effects that you should report to your doctor or health care professional as soon as possible: -allergic reactions like skin rash, itching or hives, swelling of the face, lips, or tongue -confusion -feeling faint or lightheaded, falls -fast talking and excited feelings or actions that are out of control -hallucination, loss of contact with reality -seizures -suicidal thoughts or other mood changes -unusual bleeding or bruising Side effects that usually do not require medical attention (report to your doctor or health care professional if they continue or are bothersome): -blurred vision -changes in appetite -change in sex drive or performance -headache -increased sweating -nausea This list may not describe all possible side effects. Call your doctor for medical advice about side effects. You may report side effects to FDA at 1-800-FDA-1088. Where should I keep my  medicine? Keep out of reach of children. Store at room temperature between 15 and 30 degrees C (59 and 86 degrees F). Throw away any unused medicine after the expiration date. NOTE: This sheet is a summary. It may not cover all possible information. If you have questions about this medicine, talk to your doctor, pharmacist, or health care provider.  2013, [employer]/Gold Standard. (07/03/2011 7:12:08 PM) [facility] Discharge Instructions for Patients Receiving Chemotherapy  Today you received the following chemotherapy agents Oxaliplatin, Leucovorin and Adrucil.  To help prevent nausea and vomiting after your treatment, we encourage you to take your nausea medication as prescribed.   If you develop nausea and vomiting that is not controlled by your nausea medication, call the clinic.   BELOW ARE SYMPTOMS THAT SHOULD BE REPORTED IMMEDIATELY:  *FEVER GREATER THAN 100.5 F  *CHILLS WITH OR WITHOUT FEVER  NAUSEA AND VOMITING THAT IS NOT CONTROLLED WITH YOUR NAUSEA MEDICATION  *UNUSUAL SHORTNESS OF BREATH  *UNUSUAL BRUISING OR BLEEDING  TENDERNESS IN MOUTH AND THROAT WITH OR WITHOUT PRESENCE OF ULCERS  *URINARY PROBLEMS  *BOWEL PROBLEMS  UNUSUAL RASH   Feel free to call the clinic you have any questions or concerns. The clinic phone number is [phone].   [place] Discharge Instructions for Patients Receiving Chemotherapy  Today you received the following chemotherapy agents Oxaliplatin, Leucovoein and Adrucil.  To help prevent nausea and vomiting after your treatment, we encourage you to take your nausea medication as prescribed.   If you develop nausea and vomiting that is not controlled by your nausea medication,  call the clinic.   BELOW ARE SYMPTOMS THAT SHOULD BE REPORTED IMMEDIATELY:  *FEVER GREATER THAN 100.5 F  *CHILLS WITH OR WITHOUT FEVER  NAUSEA AND VOMITING THAT IS NOT CONTROLLED WITH YOUR NAUSEA MEDICATION  *UNUSUAL  SHORTNESS OF BREATH  *UNUSUAL BRUISING OR BLEEDING  TENDERNESS IN MOUTH AND THROAT WITH OR WITHOUT PRESENCE OF ULCERS  *URINARY PROBLEMS  *BOWEL PROBLEMS  UNUSUAL RASH  Feel free to call the clinic you have any questions or concerns. The clinic phone number is [phone].

## 2012-08-11 NOTE — Progress Notes (Signed)
Reports significant nausea starting day 2 of chemo with vomiting on day 4. Nausea did not resolve until day 12.

## 2012-08-13 ENCOUNTER — Ambulatory Visit (HOSPITAL_BASED_OUTPATIENT_CLINIC_OR_DEPARTMENT_OTHER): Payer: BC Managed Care – PPO

## 2012-08-13 VITALS — BP 144/92 | HR 69 | Temp 98.0°F | Resp 20

## 2012-08-13 DIAGNOSIS — C187 Malignant neoplasm of sigmoid colon: Secondary | ICD-10-CM

## 2012-08-13 DIAGNOSIS — Z452 Encounter for adjustment and management of vascular access device: Secondary | ICD-10-CM

## 2012-08-13 MED ORDER — SODIUM CHLORIDE 0.9 % IJ SOLN
10.0000 mL | INTRAMUSCULAR | Status: DC | PRN
  Administered 2012-08-13: 10 mL
  Filled 2012-08-13: qty 10

## 2012-08-13 MED ORDER — HEPARIN SOD (PORK) LOCK FLUSH 100 UNIT/ML IV SOLN
500.0000 [IU] | Freq: Once | INTRAVENOUS | Status: AC | PRN
  Administered 2012-08-13: 500 [IU]
  Filled 2012-08-13: qty 5

## 2012-08-13 MED ORDER — HEPARIN SOD (PORK) LOCK FLUSH 100 UNIT/ML IV SOLN
500.0000 [IU] | Freq: Once | INTRAVENOUS | Status: DC | PRN
  Filled 2012-08-13: qty 5

## 2012-08-13 MED ORDER — SODIUM CHLORIDE 0.9 % IJ SOLN
10.0000 mL | INTRAMUSCULAR | Status: DC | PRN
  Filled 2012-08-13: qty 10

## 2012-08-22 ENCOUNTER — Ambulatory Visit (INDEPENDENT_AMBULATORY_CARE_PROVIDER_SITE_OTHER): Payer: 59 | Admitting: Surgery

## 2012-08-22 VITALS — BP 136/80 | HR 82 | Temp 98.0°F | Resp 18 | Ht 67.5 in | Wt 179.0 lb

## 2012-08-22 DIAGNOSIS — Z09 Encounter for follow-up examination after completed treatment for conditions other than malignant neoplasm: Secondary | ICD-10-CM

## 2012-08-22 NOTE — Progress Notes (Signed)
Subjective:     Patient ID: Sheri Calderon, female   DOB: [birth year], 47 y.o.   MRN: [id number]  HPI She is here for another postop visit. She has had 2 treatments of IV chemotherapy. She has crampy abdominal pain but moves her bowels well.  Review of Systems     Objective:   Physical Exam On exam, her abdomen is soft and nontender her incision is well-healed. The Port-A-Cath site is also well healed    Assessment:     Patient stable status post partial colectomy for colon cancer     Plan:     She will continue IV chemotherapy and will be undergoing radiation therapy as well. I will see her back in 6 months unless there is a problem

## 2012-08-24 ENCOUNTER — Other Ambulatory Visit: Payer: Self-pay | Admitting: Oncology

## 2012-08-25 ENCOUNTER — Telehealth: Payer: Self-pay | Admitting: Oncology

## 2012-08-25 ENCOUNTER — Ambulatory Visit (HOSPITAL_BASED_OUTPATIENT_CLINIC_OR_DEPARTMENT_OTHER): Payer: 59 | Admitting: Nurse Practitioner

## 2012-08-25 ENCOUNTER — Other Ambulatory Visit (HOSPITAL_BASED_OUTPATIENT_CLINIC_OR_DEPARTMENT_OTHER): Payer: 59 | Admitting: Lab

## 2012-08-25 ENCOUNTER — Ambulatory Visit (HOSPITAL_BASED_OUTPATIENT_CLINIC_OR_DEPARTMENT_OTHER): Payer: 59

## 2012-08-25 VITALS — BP 147/86 | HR 71 | Temp 97.5°F | Resp 18 | Ht 67.0 in | Wt 180.4 lb

## 2012-08-25 DIAGNOSIS — C189 Malignant neoplasm of colon, unspecified: Secondary | ICD-10-CM

## 2012-08-25 DIAGNOSIS — C187 Malignant neoplasm of sigmoid colon: Secondary | ICD-10-CM

## 2012-08-25 DIAGNOSIS — D709 Neutropenia, unspecified: Secondary | ICD-10-CM

## 2012-08-25 DIAGNOSIS — IMO0001 Reserved for inherently not codable concepts without codable children: Secondary | ICD-10-CM

## 2012-08-25 DIAGNOSIS — Z5111 Encounter for antineoplastic chemotherapy: Secondary | ICD-10-CM

## 2012-08-25 DIAGNOSIS — F329 Major depressive disorder, single episode, unspecified: Secondary | ICD-10-CM

## 2012-08-25 LAB — COMPREHENSIVE METABOLIC PANEL (CC13)
ALT: 30 U/L (ref 0–55)
Alkaline Phosphatase: 64 U/L (ref 40–150)
CO2: 25 mEq/L (ref 22–29)
Sodium: 140 mEq/L (ref 136–145)
Total Bilirubin: <0.2 mg/dL (ref 0.20–1.20)
Total Protein: 6.7 g/dL (ref 6.4–8.3)

## 2012-08-25 LAB — CBC WITH DIFFERENTIAL/PLATELET
BASO%: 1 % (ref 0.0–2.0)
LYMPH%: 42.7 % (ref 14.0–49.7)
MCHC: 34.8 g/dL (ref 31.5–36.0)
MONO#: 0.4 10*3/uL (ref 0.1–0.9)
Platelets: 267 10*3/uL (ref 145–400)
RBC: 4.03 10*6/uL (ref 3.70–5.45)
WBC: 3.8 10*3/uL — ABNORMAL LOW (ref 3.9–10.3)

## 2012-08-25 MED ORDER — DEXTROSE 5 % IV SOLN
Freq: Once | INTRAVENOUS | Status: AC
  Administered 2012-08-25: 11:00:00 via INTRAVENOUS

## 2012-08-25 MED ORDER — DEXAMETHASONE SODIUM PHOSPHATE 10 MG/ML IJ SOLN
10.0000 mg | Freq: Once | INTRAMUSCULAR | Status: AC
  Administered 2012-08-25: 10 mg via INTRAVENOUS

## 2012-08-25 MED ORDER — PALONOSETRON HCL INJECTION 0.25 MG/5ML
0.2500 mg | Freq: Once | INTRAVENOUS | Status: AC
  Administered 2012-08-25: 0.25 mg via INTRAVENOUS

## 2012-08-25 MED ORDER — FLUOROURACIL CHEMO INJECTION 2.5 GM/50ML
400.0000 mg/m2 | Freq: Once | INTRAVENOUS | Status: AC
  Administered 2012-08-25: 750 mg via INTRAVENOUS
  Filled 2012-08-25: qty 15

## 2012-08-25 MED ORDER — OXYCODONE-ACETAMINOPHEN 5-325 MG PO TABS: 1.0000 | ORAL_TABLET | Freq: Four times a day (QID) | ORAL | Status: DC | PRN

## 2012-08-25 MED ORDER — OXALIPLATIN CHEMO INJECTION 100 MG/20ML
85.0000 mg/m2 | Freq: Once | INTRAVENOUS | Status: AC
  Administered 2012-08-25: 165 mg via INTRAVENOUS
  Filled 2012-08-25: qty 33

## 2012-08-25 MED ORDER — DEXTROSE 5 % IV SOLN
400.0000 mg/m2 | Freq: Once | INTRAVENOUS | Status: AC
  Administered 2012-08-25: 772 mg via INTRAVENOUS
  Filled 2012-08-25: qty 38.6

## 2012-08-25 MED ORDER — SODIUM CHLORIDE 0.9 % IV SOLN
2400.0000 mg/m2 | INTRAVENOUS | Status: DC
  Administered 2012-08-25: 4650 mg via INTRAVENOUS
  Filled 2012-08-25: qty 93

## 2012-08-25 NOTE — Telephone Encounter (Signed)
Per staff phone call and POF I have schedueld appts.  [name]  

## 2012-08-25 NOTE — Progress Notes (Signed)
OFFICE PROGRESS NOTE  Interval history:  Sheri Calderon returns as scheduled. She completed cycle 2 FOLFOX on 08/11/2012. The nausea was controlled with Zofran. No mouth sores. She has periodic loose stools. Cold sensitivity lasted approximately 12 days. No persistent neuropathy symptoms. She has "muscle aches" for about 7 days after the chemotherapy. She takes Percocet as needed during this time.   Objective: Blood pressure 147/86, pulse 71, temperature 97.5 F (36.4 C), temperature source Oral, resp. rate 18, height 5' 7"  (1.702 m), weight 180 lb 6.4 oz (81.829 kg).  No thrush or ulcerations. Lungs clear. Regular cardiac rhythm. Port-A-Cath site is without erythema. Abdomen is soft and nontender. No hepatomegaly. Healed midline incision. Extremities without edema. Vibratory sense mildly decreased over the fingertips per tuning fork exam.  Lab Results: Lab Results  Component Value Date   WBC 3.8* 08/25/2012   HGB 11.6 08/25/2012   HCT 33.2* 08/25/2012   MCV 82.3 08/25/2012   PLT 267 08/25/2012    Chemistry:    Chemistry      Component Value Date/Time   NA 140 08/25/2012 0911   NA 137 07/03/2012 0625   K 3.4* 08/25/2012 0911   K 3.4* 07/03/2012 0625   CL 105 08/11/2012 0941   CL 102 07/03/2012 0625   CO2 25 08/25/2012 0911   CO2 29 07/03/2012 0625   BUN 9.5 08/25/2012 0911   BUN <3* 07/03/2012 0625   CREATININE 0.8 08/25/2012 0911   CREATININE 0.68 07/03/2012 0625      Component Value Date/Time   CALCIUM 9.2 08/25/2012 0911   CALCIUM 8.7 07/03/2012 0625   ALKPHOS 64 08/25/2012 0911   ALKPHOS 63 06/07/2012 1938   AST 19 08/25/2012 0911   AST 10 06/07/2012 1938   ALT 30 08/25/2012 0911   ALT 9 06/07/2012 1938   BILITOT <0.20 Repeated and Verified 08/25/2012 0911   BILITOT 0.3 06/07/2012 1938       Studies/Results: No results found.  Medications: I have reviewed the patient's current medications.  Assessment/Plan:  1. 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 The tumor is associated with a bowel perforation. Tumor focally involved a radial surgical margin. She completed cycle 1 adjuvant FOLFOX 07/28/2012; cycle 2 08/11/2012. 2. Bowel perforation/diverticulitis secondary to #1. 3. History of atrial fibrillation. 4. Family history of cancer. The sigmoid colon tumor 06/28/2012 was microsatellite stable and without loss of mismatch repair proteins. 5. Status post Port-A-Cath placement 07/11/2012. 6. Nausea and vomiting following cycle 1 FOLFOX. Aloxi was added with cycle 2. 7. Depression. She is on Lexapro. 8. "Muscle aches" following chemotherapy. Likely unrelated to the chemotherapy. She will try to wean off of the Percocet. 9. Mild neutropenia.  Disposition-Sheri Calderon appears stable. She has completed 2 cycles of adjuvant FOLFOX. She is mildly neutropenic on today's labs. Plan to proceed with cycle 3 FOLFOX as scheduled with Neulasta on the day of pump discontinuation. We reviewed potential toxicities associated with Neulasta including arthralgias, skin rash, splenic rupture. She will return for a followup visit and cycle 4 FOLFOX in 2 weeks. We will contact Dr. Lisbeth Renshaw regarding the timing of radiation. She will contact the office prior to her next visit with any problems.  Plan reviewed with Dr. Benay Spice.  Ned Card ANP/GNP-BC

## 2012-08-25 NOTE — Patient Instructions (Addendum)
[place] Discharge Instructions for Patients Receiving Chemotherapy  Today you received the following chemotherapy agents Oxaliplatin/Leucovorin/5FU  To help prevent nausea and vomiting after your treatment, we encourage you to take your nausea medication as prescribed.   If you develop nausea and vomiting that is not controlled by your nausea medication, call the clinic.   BELOW ARE SYMPTOMS THAT SHOULD BE REPORTED IMMEDIATELY:  *FEVER GREATER THAN 100.5 F  *CHILLS WITH OR WITHOUT FEVER  NAUSEA AND VOMITING THAT IS NOT CONTROLLED WITH YOUR NAUSEA MEDICATION  *UNUSUAL SHORTNESS OF BREATH  *UNUSUAL BRUISING OR BLEEDING  TENDERNESS IN MOUTH AND THROAT WITH OR WITHOUT PRESENCE OF ULCERS  *URINARY PROBLEMS  *BOWEL PROBLEMS  UNUSUAL RASH Items with * indicate a potential emergency and should be followed up as soon as possible.  Feel free to call the clinic you have any questions or concerns. The clinic phone number is [phone].

## 2012-08-25 NOTE — Telephone Encounter (Signed)
gv and printed appt sched and avs for pt....MW added tx.

## 2012-08-26 ENCOUNTER — Other Ambulatory Visit: Payer: Self-pay | Admitting: Emergency Medicine

## 2012-08-27 ENCOUNTER — Ambulatory Visit (HOSPITAL_BASED_OUTPATIENT_CLINIC_OR_DEPARTMENT_OTHER): Payer: 59

## 2012-08-27 VITALS — BP 140/80 | HR 64 | Temp 97.8°F

## 2012-08-27 DIAGNOSIS — D709 Neutropenia, unspecified: Secondary | ICD-10-CM

## 2012-08-27 MED ORDER — PEGFILGRASTIM INJECTION 6 MG/0.6ML
6.0000 mg | Freq: Once | SUBCUTANEOUS | Status: AC
  Administered 2012-08-27: 6 mg via SUBCUTANEOUS

## 2012-08-27 MED ORDER — SODIUM CHLORIDE 0.9 % IJ SOLN
10.0000 mL | INTRAMUSCULAR | Status: DC | PRN
  Administered 2012-08-27: 10 mL
  Filled 2012-08-27: qty 10

## 2012-08-27 MED ORDER — HEPARIN SOD (PORK) LOCK FLUSH 100 UNIT/ML IV SOLN
500.0000 [IU] | Freq: Once | INTRAVENOUS | Status: AC | PRN
  Administered 2012-08-27: 500 [IU]
  Filled 2012-08-27: qty 5

## 2012-08-29 ENCOUNTER — Encounter (INDEPENDENT_AMBULATORY_CARE_PROVIDER_SITE_OTHER): Payer: 59 | Admitting: Surgery

## 2012-09-05 ENCOUNTER — Encounter: Payer: BC Managed Care – PPO | Admitting: Genetic Counselor

## 2012-09-05 ENCOUNTER — Telehealth: Payer: Self-pay | Admitting: Genetic Counselor

## 2012-09-05 ENCOUNTER — Other Ambulatory Visit: Payer: BC Managed Care – PPO | Admitting: Lab

## 2012-09-05 NOTE — Telephone Encounter (Signed)
PT CALLED TO R/S APPT TO 08/28 @ 1.

## 2012-09-07 ENCOUNTER — Telehealth: Payer: Self-pay | Admitting: *Deleted

## 2012-09-07 NOTE — Telephone Encounter (Signed)
Call from pt reporting abdominal "swelling". Feels pain on R side of abdomen when she presses the center of her abdomen. Also asking if chemo can stop her menstrual cycle? Pt reports she has had a tubal ligation. Bowels and bladder are moving. Also reports pressure/pain at rectum intermittently. Informed pt MD will assess these things at office visit 7/10. She voiced understanding.

## 2012-09-08 ENCOUNTER — Ambulatory Visit (HOSPITAL_BASED_OUTPATIENT_CLINIC_OR_DEPARTMENT_OTHER): Payer: 59 | Admitting: Oncology

## 2012-09-08 ENCOUNTER — Other Ambulatory Visit: Payer: Self-pay | Admitting: *Deleted

## 2012-09-08 ENCOUNTER — Telehealth: Payer: Self-pay | Admitting: Oncology

## 2012-09-08 ENCOUNTER — Ambulatory Visit (HOSPITAL_BASED_OUTPATIENT_CLINIC_OR_DEPARTMENT_OTHER): Payer: 59

## 2012-09-08 ENCOUNTER — Other Ambulatory Visit (HOSPITAL_BASED_OUTPATIENT_CLINIC_OR_DEPARTMENT_OTHER): Payer: 59 | Admitting: Lab

## 2012-09-08 VITALS — BP 136/84 | HR 91 | Temp 97.3°F | Resp 18 | Ht 67.0 in | Wt 181.3 lb

## 2012-09-08 DIAGNOSIS — R1084 Generalized abdominal pain: Secondary | ICD-10-CM

## 2012-09-08 DIAGNOSIS — C187 Malignant neoplasm of sigmoid colon: Secondary | ICD-10-CM

## 2012-09-08 DIAGNOSIS — Z800 Family history of malignant neoplasm of digestive organs: Secondary | ICD-10-CM

## 2012-09-08 DIAGNOSIS — Z5111 Encounter for antineoplastic chemotherapy: Secondary | ICD-10-CM

## 2012-09-08 DIAGNOSIS — IMO0001 Reserved for inherently not codable concepts without codable children: Secondary | ICD-10-CM

## 2012-09-08 DIAGNOSIS — C189 Malignant neoplasm of colon, unspecified: Secondary | ICD-10-CM

## 2012-09-08 DIAGNOSIS — Z452 Encounter for adjustment and management of vascular access device: Secondary | ICD-10-CM

## 2012-09-08 LAB — CBC WITH DIFFERENTIAL/PLATELET
BASO%: 0.5 % (ref 0.0–2.0)
Basophils Absolute: 0 10*3/uL (ref 0.0–0.1)
EOS%: 5.5 % (ref 0.0–7.0)
HGB: 12.3 g/dL (ref 11.6–15.9)
MCH: 29.3 pg (ref 25.1–34.0)
MCHC: 34.9 g/dL (ref 31.5–36.0)
MCV: 83.9 fL (ref 79.5–101.0)
MONO%: 8.7 % (ref 0.0–14.0)
RBC: 4.19 10*6/uL (ref 3.70–5.45)
RDW: 16.8 % — ABNORMAL HIGH (ref 11.2–14.5)
lymph#: 1.8 10*3/uL (ref 0.9–3.3)

## 2012-09-08 LAB — COMPREHENSIVE METABOLIC PANEL (CC13)
ALT: 100 U/L — ABNORMAL HIGH (ref 0–55)
AST: 59 U/L — ABNORMAL HIGH (ref 5–34)
Albumin: 3.4 g/dL — ABNORMAL LOW (ref 3.5–5.0)
Alkaline Phosphatase: 86 U/L (ref 40–150)
BUN: 8.2 mg/dL (ref 7.0–26.0)
Calcium: 9 mg/dL (ref 8.4–10.4)
Chloride: 106 mEq/L (ref 98–109)
Potassium: 3.4 mEq/L — ABNORMAL LOW (ref 3.5–5.1)
Sodium: 140 mEq/L (ref 136–145)

## 2012-09-08 MED ORDER — LEUCOVORIN CALCIUM INJECTION 350 MG
400.0000 mg/m2 | Freq: Once | INTRAVENOUS | Status: AC
  Administered 2012-09-08: 772 mg via INTRAVENOUS
  Filled 2012-09-08: qty 38.6

## 2012-09-08 MED ORDER — SODIUM CHLORIDE 0.9 % IV SOLN
2400.0000 mg/m2 | INTRAVENOUS | Status: DC
  Administered 2012-09-08: 4650 mg via INTRAVENOUS
  Filled 2012-09-08: qty 93

## 2012-09-08 MED ORDER — ONDANSETRON HCL 8 MG PO TABS: 8.0000 mg | ORAL_TABLET | Freq: Three times a day (TID) | ORAL | Status: DC | PRN

## 2012-09-08 MED ORDER — FLUOROURACIL CHEMO INJECTION 2.5 GM/50ML
400.0000 mg/m2 | Freq: Once | INTRAVENOUS | Status: AC
  Administered 2012-09-08: 750 mg via INTRAVENOUS
  Filled 2012-09-08: qty 15

## 2012-09-08 MED ORDER — DEXTROSE 5 % IV SOLN
Freq: Once | INTRAVENOUS | Status: AC
  Administered 2012-09-08: 10:00:00 via INTRAVENOUS

## 2012-09-08 MED ORDER — DEXTROSE 5 % IV SOLN
85.0000 mg/m2 | Freq: Once | INTRAVENOUS | Status: AC
  Administered 2012-09-08: 165 mg via INTRAVENOUS
  Filled 2012-09-08: qty 33

## 2012-09-08 MED ORDER — ALTEPLASE 2 MG IJ SOLR
2.0000 mg | Freq: Once | INTRAMUSCULAR | Status: AC | PRN
  Administered 2012-09-08: 2 mg
  Filled 2012-09-08: qty 2

## 2012-09-08 MED ORDER — HYDROCODONE-ACETAMINOPHEN 5-325 MG PO TABS
1.0000 | ORAL_TABLET | ORAL | Status: AC
  Administered 2012-09-08: 1 via ORAL

## 2012-09-08 MED ORDER — PALONOSETRON HCL INJECTION 0.25 MG/5ML
0.2500 mg | Freq: Once | INTRAVENOUS | Status: AC
  Administered 2012-09-08: 0.25 mg via INTRAVENOUS

## 2012-09-08 MED ORDER — DEXAMETHASONE SODIUM PHOSPHATE 10 MG/ML IJ SOLN
10.0000 mg | Freq: Once | INTRAMUSCULAR | Status: AC
  Administered 2012-09-08: 10 mg via INTRAVENOUS

## 2012-09-08 MED ORDER — HYDROCODONE-ACETAMINOPHEN 5-325 MG PO TABS: 1.0000 | ORAL_TABLET | Freq: Four times a day (QID) | ORAL | Status: DC | PRN

## 2012-09-08 MED ORDER — CAPECITABINE 500 MG PO TABS: 1500.0000 mg | ORAL_TABLET | Freq: Two times a day (BID) | ORAL | Status: DC

## 2012-09-08 NOTE — Patient Instructions (Addendum)
[place] Discharge Instructions for Patients Receiving Chemotherapy  Today you received the following chemotherapy agents oxaliplatin, 5FU, leucovorin  To help prevent nausea and vomiting after your treatment, we encourage you to take your nausea medication as needed   If you develop nausea and vomiting that is not controlled by your nausea medication, call the clinic.   BELOW ARE SYMPTOMS THAT SHOULD BE REPORTED IMMEDIATELY:  *FEVER GREATER THAN 100.5 F  *CHILLS WITH OR WITHOUT FEVER  NAUSEA AND VOMITING THAT IS NOT CONTROLLED WITH YOUR NAUSEA MEDICATION  *UNUSUAL SHORTNESS OF BREATH  *UNUSUAL BRUISING OR BLEEDING  TENDERNESS IN MOUTH AND THROAT WITH OR WITHOUT PRESENCE OF ULCERS  *URINARY PROBLEMS  *BOWEL PROBLEMS  UNUSUAL RASH Items with * indicate a potential emergency and should be followed up as soon as possible.  Feel free to call the clinic you have any questions or concerns. The clinic phone number is [phone].   Foods Rich in Potassium Food / Potassium (mg)  Apricots, dried,  cup / 378 mg   Apricots, raw, 1 cup halves / 401 mg   Avocado,  / 487 mg   Banana, 1 large / 487 mg   Beef, lean, round, 3 oz / 202 mg   Cantaloupe, 1 cup cubes / 427 mg   Dates, medjool, 5 whole / 835 mg   Ham, cured, 3 oz / 212 mg   Lentils, dried,  cup / 458 mg   Lima beans, frozen,  cup / 258 mg   Orange, 1 large / 333 mg   Orange juice, 1 cup / 443 mg   Peaches, dried,  cup / 398 mg   Peas, split, cooked,  cup / 355 mg   Potato, boiled, 1 medium / 515 mg   Prunes, dried, uncooked,  cup / 318 mg   Raisins,  cup / 309 mg   Salmon, pink, raw, 3 oz / 275 mg   Sardines, canned , 3 oz / 338 mg   Tomato, raw, 1 medium / 292 mg   Tomato juice, 6 oz / 417 mg   [place], 3 oz / 349 mg  Document Released: 02/16/2005 Document Revised: 10/29/2010 Document Reviewed: 07/02/2008 [facility] Patient Information 2012 [place], [place].

## 2012-09-08 NOTE — Progress Notes (Signed)
1015--port accessed, no blood return noted, flushes well.  TPA infused at 1035.    Per pt request, PIV started in right wrist to begin premedications while the TPA dwells in the port.    1110--positive blood return noted from the port.  10cc blood removed.  New dextrose line started with port.  PIV and tubing discontinued.  Premedications only infused through the PIV, Will administer chemo through the port.  SLJ  1329--pt c/o 8/10 pain in her abdomen.  Per Dr Benay Spice, okay to give 1 norco.  Pt needs to contact surgeon regarding pain.  Pt verbalized understanding.  1348--pt d/c'd to go home before she can be reassessed 30 minutes after pain medication administration.  Pt states upon d/c that the pain medication has not started helping yet.  Encouraged her to contact her surgeon, she verbalized understanding and will call them today.  SLJ

## 2012-09-08 NOTE — Telephone Encounter (Signed)
gave pt appt for lab and ML on July 24th

## 2012-09-08 NOTE — Patient Instructions (Signed)
During radiation you will be taking chemo pills. They are to be taken twice daily with meals.  Capecitabine tablets What is this medicine? CAPECITABINE (ka pe SITE a been) is a chemotherapy drug. It slows the growth of cancer cells. This medicine is used to treat breast cancer, and also colon or rectal cancer. This medicine may be used for other purposes; ask your health care provider or pharmacist if you have questions. What should I tell my health care provider before I take this medicine? They need to know if you have any of these conditions: -bleeding or blood disorders -dihydropyrimidine dehydrogenase (DPD) deficiency -heart disease -infection (especially a virus infection such as chickenpox, cold sores, or herpes) -kidney disease -liver disease -an unusual or allergic reaction to capecitabine, 5-fluorouracil, other medicines, foods, dyes, or preservatives -pregnant or trying to get pregnant -breast-feeding How should I use this medicine? Take this medicine by mouth with a glass of water, within 30 minutes of the end of a meal. Follow the directions on the prescription label. Take your medicine at regular intervals. Do not take it more often than directed. Do not stop taking except on your doctor's advice. Your doctor may want you to take a combination of 150 mg and 500 mg tablets for each dose. It is very important that you know how to correctly take your dose. Taking the wrong tablets could result in an overdose (too much medication) or underdose (too little medication). Talk to your pediatrician regarding the use of this medicine in children. Special care may be needed. Overdosage: If you think you have taken too much of this medicine contact a poison control center or emergency room at once. NOTE: This medicine is only for you. Do not share this medicine with others. What if I miss a dose? If you miss a dose, do not take the missed dose at all. Do not take double or extra doses.  Instead, continue with your next scheduled dose and check with your doctor. What may interact with this medicine? -antacids with aluminum and/or magnesium -folic acid -leucovorin -medicines to increase blood counts like filgrastim, pegfilgrastim, sargramostim -phenytoin -vaccines -warfarin Talk to your doctor or health care professional before taking any of these medicines: -acetaminophen -aspirin -ibuprofen -ketoprofen -naproxen This list may not describe all possible interactions. Give your health care provider a list of all the medicines, herbs, non-prescription drugs, or dietary supplements you use. Also tell them if you smoke, drink alcohol, or use illegal drugs. Some items may interact with your medicine. What should I watch for while using this medicine? Visit your doctor for checks on your progress. This drug may make you feel generally unwell. This is not uncommon, as chemotherapy can affect healthy cells as well as cancer cells. Report any side effects. Continue your course of treatment even though you feel ill unless your doctor tells you to stop. In some cases, you may be given additional medicines to help with side effects. Follow all directions for their use. Call your doctor or health care professional for advice if you get a fever, chills or sore throat, or other symptoms of a cold or flu. Do not treat yourself. This drug decreases your body's ability to fight infections. Try to avoid being around people who are sick. This medicine may increase your risk to bruise or bleed. Call your doctor or health care professional if you notice any unusual bleeding. Be careful brushing and flossing your teeth or using a toothpick because you may get an  infection or bleed more easily. If you have any dental work done, tell your dentist you are receiving this medicine. Avoid taking products that contain aspirin, acetaminophen, ibuprofen, naproxen, or ketoprofen unless instructed by your  doctor. These medicines may hide a fever. Do not become pregnant while taking this medicine. Women should inform their doctor if they wish to become pregnant or think they might be pregnant. There is a potential for serious side effects to an unborn child. Talk to your health care professional or pharmacist for more information. Do not breast-feed an infant while taking this medicine. Men are advised not to father a child while taking this medicine. What side effects may I notice from receiving this medicine? Side effects that you should report to your doctor or health care professional as soon as possible: -allergic reactions like skin rash, itching or hives, swelling of the face, lips, or tongue -low blood counts - this medicine may decrease the number of white blood cells, red blood cells and platelets. You may be at increased risk for infections and bleeding. -signs of infection - fever or chills, cough, sore throat, pain or difficulty passing urine -signs of decreased platelets or bleeding - bruising, pinpoint red spots on the skin, black, tarry stools, blood in the urine -signs of decreased red blood cells - unusually weak or tired, fainting spells, lightheadedness -breathing problems -changes in vision -chest pain -diarrhea of more than 4 bowel movements in one day or any diarrhea at night -mouth sores -nausea and vomiting -pain, swelling, redness at site where injected -pain, tingling, numbness in the hands or feet -redness, swelling, or sores on hands or feet -stomach pain -vomiting -yellow color of skin or eyes Side effects that usually do not require medical attention (report to your doctor or health care professional if they continue or are bothersome): -constipation -diarrhea -dry or itchy skin -hair loss -loss of appetite -nausea -weak or tired This list may not describe all possible side effects. Call your doctor for medical advice about side effects. You may report side  effects to FDA at 1-800-FDA-1088. Where should I keep my medicine? Keep out of the reach of children. Store at room temperature between 15 and 30 degrees C (59 and 86 degrees F). Keep container tightly closed. Throw away any unused medicine after the expiration date. NOTE: This sheet is a summary. It may not cover all possible information. If you have questions about this medicine, talk to your doctor, pharmacist, or health care provider.  2013, [employer]. (01/30/2008 11:47:41 AM)

## 2012-09-08 NOTE — Progress Notes (Signed)
Met with patient prior to chemo initiation to assess for needs.  She denies barriers to care such as transportation or financial issues.  She states she is eating and drinking well.  She is concerned that her pain medication was changed by MD today since she has pain in her abdomen on approximately day 3-4 after chemo.  Dr. Benay Spice was informed and he stated that if she experiences pain, with no relief after taking the new pain medication,  She is to call [phone] or if severe go to the nearest ED.  Patient verbalized comprehension. Patient denies other needs at this time and said she would call if any arise.

## 2012-09-08 NOTE — Progress Notes (Signed)
   [facility]    OFFICE PROGRESS NOTE   INTERVAL HISTORY:   She returns as scheduled. She completed a third cycle of FOLFOX beginning on 08/25/2012. Cold sensitivity lasted for approximately 9 days after chemotherapy. No numbness today. She reports diffuse muscle aching following chemotherapy. Nausea lasted for 8 days after chemotherapy and was relieved with Zofran. No diarrhea or mouth sores. She reports being "late "by 8 days on her menstrual cycle.  She continues to have abdominal pain. She requests a refill on the Percocet. The pain is chiefly present in the low mid and right abdomen.  She saw Dr. Lisbeth Renshaw and is being scheduled to receive adjuvant radiation.  Objective:  Vital signs in last 24 hours:  Blood pressure 136/84, pulse 91, temperature 97.3 F (36.3 C), temperature source Oral, resp. rate 18, height 5' 7"  (1.702 m), weight 181 lb 4.8 oz (82.237 kg).    HEENT: No thrush or ulcers Resp: Lungs clear bilaterally Cardio: Regular rate and rhythm GI: No hepatosplenomegaly, no mass, mild tenderness in the mid low abdomen and right low abdomen. Vascular: No leg edema Neuro: The vibratory sense is mildly diminished at the fingertips bilaterally  Skin: Palms and soles with dryness and mild erythema. No skin breakdown   Portacath/PICC-without erythema  Lab Results:  Lab Results  Component Value Date   WBC 4.3 09/08/2012   HGB 12.3 09/08/2012   HCT 35.2 09/08/2012   MCV 83.9 09/08/2012   PLT 131* 09/08/2012   ANC 1.9    Medications: I have reviewed the patient's current medications.  Assessment/Plan: 1. 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 The tumor is associated with a bowel perforation. Tumor focally involved a radial surgical margin. She completed cycle 1 adjuvant FOLFOX 07/28/2012; cycle 2 08/11/2012. 2. Bowel perforation/diverticulitis  secondary to #1. 3. History of atrial fibrillation. 4. Family history of cancer. The sigmoid colon tumor 06/28/2012 was microsatellite stable and without loss of mismatch repair proteins. 5. Status post Port-A-Cath placement 07/11/2012. 6. Nausea and vomiting following cycle 1 FOLFOX. Aloxi was added with cycle 2. 7. Depression. She is on Lexapro. 8. "Muscle aches" following chemotherapy. ? Manifestation of oxaliplatin neuropathy 9. Abdominal pain-this is most likely not related to chemotherapy. I recommended she followup with Dr. Ninfa Linden. It is unusual to have this degree of pain 2 months out from abdominal surgery. 10. ? 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 We will not administer Neulasta with this cycle. We gave her a prescription for hydrocodone to use as needed for pain. It is possible the "muscle aches "following chemotherapy are manifestation of oxaliplatin neuropathy.  I discussed the case with Dr. Lisbeth Renshaw. The plan is to begin radiation during the week of 09/26/2012. We will give concurrent capecitabine. I reviewed the potential toxicities associated with capecitabine including the chance for mucositis, diarrhea, and hematologic toxicity. We discussed the skin rash, hyperpigmentation, and hand/foot syndrome associated with capecitabine. She will take capecitabine only on the days of radiation.  Ms. Bissette will return for an office visit on 09/22/2012. The irregular menses may be related to chemotherapy.   Betsy Coder, MD  09/08/2012  10:15 AM

## 2012-09-09 ENCOUNTER — Other Ambulatory Visit: Payer: Self-pay | Admitting: Oncology

## 2012-09-10 ENCOUNTER — Ambulatory Visit (HOSPITAL_BASED_OUTPATIENT_CLINIC_OR_DEPARTMENT_OTHER): Payer: 59

## 2012-09-10 VITALS — BP 152/85 | HR 72 | Temp 96.9°F

## 2012-09-10 DIAGNOSIS — Z452 Encounter for adjustment and management of vascular access device: Secondary | ICD-10-CM

## 2012-09-10 DIAGNOSIS — C187 Malignant neoplasm of sigmoid colon: Secondary | ICD-10-CM

## 2012-09-10 MED ORDER — SODIUM CHLORIDE 0.9 % IJ SOLN
10.0000 mL | INTRAMUSCULAR | Status: DC | PRN
  Administered 2012-09-10: 10 mL
  Filled 2012-09-10: qty 10

## 2012-09-10 MED ORDER — HEPARIN SOD (PORK) LOCK FLUSH 100 UNIT/ML IV SOLN
500.0000 [IU] | Freq: Once | INTRAVENOUS | Status: AC | PRN
  Administered 2012-09-10: 500 [IU]
  Filled 2012-09-10: qty 5

## 2012-09-12 ENCOUNTER — Encounter: Payer: Self-pay | Admitting: Oncology

## 2012-09-12 NOTE — Progress Notes (Signed)
[facility], [id number], approved xeloda from 09/09/12-03/12/13.

## 2012-09-14 ENCOUNTER — Telehealth: Payer: Self-pay | Admitting: *Deleted

## 2012-09-14 NOTE — Telephone Encounter (Addendum)
Message from pt to follow up on Xeloda Rx. She has not heard from pharmacy. Spoke with Lucy from [place] was shipped today for next day delivery. Called pt with this info. She voiced understanding.

## 2012-09-15 ENCOUNTER — Ambulatory Visit: Admission: RE | Admit: 2012-09-15 | Payer: 59 | Source: Ambulatory Visit | Admitting: Radiation Oncology

## 2012-09-15 NOTE — Telephone Encounter (Signed)
RECEIVED A FAX FROM [facility]CERNING A CONFIRMATION OF PRESCRIPTION SHIPMENT FOR CAPECITABINE ON 09/14/12.

## 2012-09-16 ENCOUNTER — Ambulatory Visit
Admission: RE | Admit: 2012-09-16 | Discharge: 2012-09-16 | Disposition: A | Discharge status: Home / Self Care | Payer: 59 | Source: Ambulatory Visit | Attending: Radiation Oncology | Admitting: Radiation Oncology

## 2012-09-16 ENCOUNTER — Other Ambulatory Visit: Payer: Self-pay | Admitting: Oncology

## 2012-09-16 ENCOUNTER — Other Ambulatory Visit: Payer: Self-pay | Admitting: *Deleted

## 2012-09-16 DIAGNOSIS — M545 Low back pain, unspecified: Secondary | ICD-10-CM | POA: Insufficient documentation

## 2012-09-16 DIAGNOSIS — R197 Diarrhea, unspecified: Secondary | ICD-10-CM | POA: Insufficient documentation

## 2012-09-16 DIAGNOSIS — Z510 Encounter for antineoplastic radiation therapy: Secondary | ICD-10-CM | POA: Insufficient documentation

## 2012-09-16 DIAGNOSIS — C187 Malignant neoplasm of sigmoid colon: Secondary | ICD-10-CM | POA: Insufficient documentation

## 2012-09-16 DIAGNOSIS — C189 Malignant neoplasm of colon, unspecified: Secondary | ICD-10-CM

## 2012-09-16 NOTE — Telephone Encounter (Signed)
Message from [place] with [facility] reporting pt will need to get Xeloda refills from [facility] pharmacy. [facility] phone: [phone]. Fax: [phone]. Clarified with pt, she has received initial Xeloda Rx. Any refills will go to above pharmacy.

## 2012-09-18 ENCOUNTER — Other Ambulatory Visit: Payer: Self-pay | Admitting: Oncology

## 2012-09-20 ENCOUNTER — Other Ambulatory Visit: Payer: Self-pay | Admitting: Oncology

## 2012-09-22 ENCOUNTER — Telehealth: Payer: Self-pay | Admitting: Oncology

## 2012-09-22 ENCOUNTER — Ambulatory Visit (HOSPITAL_BASED_OUTPATIENT_CLINIC_OR_DEPARTMENT_OTHER): Payer: 59 | Admitting: Nurse Practitioner

## 2012-09-22 ENCOUNTER — Other Ambulatory Visit (HOSPITAL_BASED_OUTPATIENT_CLINIC_OR_DEPARTMENT_OTHER): Payer: 59

## 2012-09-22 VITALS — BP 115/76 | HR 83 | Temp 97.7°F | Resp 18 | Ht 67.0 in | Wt 187.5 lb

## 2012-09-22 DIAGNOSIS — C189 Malignant neoplasm of colon, unspecified: Secondary | ICD-10-CM

## 2012-09-22 LAB — CBC WITH DIFFERENTIAL/PLATELET
Basophils Absolute: 0.1 10*3/uL (ref 0.0–0.1)
Eosinophils Absolute: 0.4 10*3/uL (ref 0.0–0.5)
HCT: 32.1 % — ABNORMAL LOW (ref 34.8–46.6)
HGB: 11.2 g/dL — ABNORMAL LOW (ref 11.6–15.9)
MCH: 29.8 pg (ref 25.1–34.0)
MONO#: 0.5 10*3/uL (ref 0.1–0.9)
NEUT#: 0.6 10*3/uL — ABNORMAL LOW (ref 1.5–6.5)
NEUT%: 19.6 % — ABNORMAL LOW (ref 38.4–76.8)
RDW: 18.4 % — ABNORMAL HIGH (ref 11.2–14.5)
WBC: 3.2 10*3/uL — ABNORMAL LOW (ref 3.9–10.3)
lymph#: 1.7 10*3/uL (ref 0.9–3.3)

## 2012-09-22 NOTE — Progress Notes (Signed)
OFFICE PROGRESS NOTE  Interval history:  Sheri Calderon returns as scheduled. She completed cycle 4 FOLFOX beginning 09/08/2012. She had nausea which was controlled with Zofran. No mouth sores. She had loose stools for 2 days. Cold sensitivity lasted approximately 9 days. No persistent neuropathy symptoms. The right-sided abdominal pain is better. She continues to have periodic "muscle cramps" mainly involving the calves. She takes hydrocodone as needed.   Objective: Blood pressure 115/76, pulse 83, temperature 97.7 F (36.5 C), temperature source Oral, resp. rate 18, height 5' 7"  (1.702 m), weight 187 lb 8 oz (85.049 kg).  No thrush or ulcerations. Lungs clear. Regular cardiac rhythm. Port-A-Cath site is without erythema. Abdomen is soft and nontender. No hepatomegaly. Extremities are without edema. Calves are soft and nontender.  Lab Results: Lab Results  Component Value Date   WBC 3.2* 09/22/2012   HGB 11.2* 09/22/2012   HCT 32.1* 09/22/2012   MCV 85.6 09/22/2012   PLT 159 09/22/2012    Chemistry:    Chemistry      Component Value Date/Time   NA 140 09/08/2012 0854   NA 137 07/03/2012 0625   K 3.4* 09/08/2012 0854   K 3.4* 07/03/2012 0625   CL 105 08/11/2012 0941   CL 102 07/03/2012 0625   CO2 24 09/08/2012 0854   CO2 29 07/03/2012 0625   BUN 8.2 09/08/2012 0854   BUN <3* 07/03/2012 0625   CREATININE 1.0 09/08/2012 0854   CREATININE 0.68 07/03/2012 0625      Component Value Date/Time   CALCIUM 9.0 09/08/2012 0854   CALCIUM 8.7 07/03/2012 0625   ALKPHOS 86 09/08/2012 0854   ALKPHOS 63 06/07/2012 1938   AST 59* 09/08/2012 0854   AST 10 06/07/2012 1938   ALT 100* 09/08/2012 0854   ALT 9 06/07/2012 1938   BILITOT 0.33 09/08/2012 0854   BILITOT 0.3 06/07/2012 1938       Studies/Results: No results found.  Medications: I have reviewed the patient's current medications.  Assessment/Plan:  1. 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 The tumor is associated with a bowel perforation. Tumor focally involved a radial surgical margin. She completed cycle 1 adjuvant FOLFOX 07/28/2012; cycle 2 08/11/2012; cycle 3 08/25/2012; cycle 4 09/08/2012. 2. Bowel perforation/diverticulitis secondary to #1. 3. History of atrial fibrillation. 4. Family history of cancer. The sigmoid colon tumor 06/28/2012 was microsatellite stable and without loss of mismatch repair proteins. 5. Status post Port-A-Cath placement 07/11/2012. 6. Nausea and vomiting following cycle 1 FOLFOX. Aloxi was added with cycle 2. 7. Depression. She is on Lexapro. 8. "Muscle aches" following chemotherapy. ? manifestation of oxaliplatin neuropathy. She takes hydrocodone as needed. 9. Abdominal pain-this is most likely not related to chemotherapy. The pain is better. 10. 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 11. Neutropenia related to recent chemotherapy.  Disposition-Ms. Dimercurio appears stable. She has completed 4 cycles of FOLFOX chemotherapy. She is scheduled to begin radiation/Xeloda 09/26/2012. We again reviewed potential toxicities associated with Xeloda including nausea, mouth sores, diarrhea, hand-foot syndrome, skin rash, skin hyperpigmentation, conjunctivitis.  She is neutropenic on labs today. We will obtain a followup CBC on 09/26/2012 with plans to proceed with the Xeloda if the absolute neutrophil count count is greater than 1000. She understands to contact the office with fever, chills, other signs of infection.  She will return for a followup visit on 10/14/2012. She will contact the  office in the interim as outlined above or with any other problems.  Plan reviewed with Dr. Benay Spice.  Ned Card ANP/GNP-BC

## 2012-09-22 NOTE — Telephone Encounter (Signed)
gv and printed appt sched and avs for pt.Sheri KitchenMarland Calderon

## 2012-09-23 ENCOUNTER — Ambulatory Visit: Payer: 59 | Admitting: Family Medicine

## 2012-09-23 ENCOUNTER — Ambulatory Visit
Admission: RE | Admit: 2012-09-23 | Discharge: 2012-09-23 | Disposition: A | Discharge status: Home / Self Care | Payer: 59 | Source: Ambulatory Visit | Attending: Radiation Oncology | Admitting: Radiation Oncology

## 2012-09-23 DIAGNOSIS — C187 Malignant neoplasm of sigmoid colon: Secondary | ICD-10-CM

## 2012-09-26 ENCOUNTER — Ambulatory Visit
Admission: RE | Admit: 2012-09-26 | Discharge: 2012-09-26 | Disposition: A | Discharge status: Home / Self Care | Payer: 59 | Source: Ambulatory Visit | Attending: Radiation Oncology | Admitting: Radiation Oncology

## 2012-09-26 ENCOUNTER — Other Ambulatory Visit: Payer: Self-pay | Admitting: Oncology

## 2012-09-26 DIAGNOSIS — C187 Malignant neoplasm of sigmoid colon: Secondary | ICD-10-CM

## 2012-09-26 DIAGNOSIS — C189 Malignant neoplasm of colon, unspecified: Secondary | ICD-10-CM

## 2012-09-27 ENCOUNTER — Ambulatory Visit
Admission: RE | Admit: 2012-09-27 | Discharge: 2012-09-27 | Disposition: A | Discharge status: Home / Self Care | Payer: 59 | Source: Ambulatory Visit | Attending: Radiation Oncology | Admitting: Radiation Oncology

## 2012-09-27 DIAGNOSIS — C187 Malignant neoplasm of sigmoid colon: Secondary | ICD-10-CM

## 2012-09-27 MED ORDER — RADIAPLEXRX EX GEL
Freq: Once | CUTANEOUS | Status: AC
  Administered 2012-09-27: 12:00:00 via TOPICAL

## 2012-09-27 NOTE — Progress Notes (Signed)
Sheri Calderon is here for post sim education.  She was given the Radiation Therapy and You book and discussed the potential side effects of radiation including fatigue, diarrhea, skin changes, nausea and vomiting and bladder changes.  She was given radiaplex gel and instructed to apply to her abdomen twice a day after treatment and at bedtime.  We discussed PUT day with Dr. Lisbeth Renshaw on Fridays.  She was advised to contact nursing with any questions or concerns.

## 2012-09-27 NOTE — Progress Notes (Signed)
  Radiation Oncology         [phone] ________________________________  Name: Sheri Calderon MRN: [id number]  Date: 09/23/2012  DOB: 09/03/1965  Simulation Verification Note   NARRATIVE: The patient was brought to the treatment unit and placed in the planned treatment position. The clinical setup was verified. Then port films were obtained and uploaded to the radiation oncology medical record software.  The treatment beams were carefully compared against the planned radiation fields. The position, location, and shape of the radiation fields was reviewed. The targeted volume of tissue appears to be appropriately covered by the radiation beams. Based on my personal review, I approved the simulation verification. The patient's treatment will proceed as planned.  ________________________________   Jodelle Gross, MD, PhD

## 2012-09-28 ENCOUNTER — Ambulatory Visit
Admission: RE | Admit: 2012-09-28 | Discharge: 2012-09-28 | Disposition: A | Discharge status: Home / Self Care | Payer: 59 | Source: Ambulatory Visit | Attending: Radiation Oncology | Admitting: Radiation Oncology

## 2012-09-29 ENCOUNTER — Ambulatory Visit: Payer: 59

## 2012-09-30 ENCOUNTER — Ambulatory Visit
Admission: RE | Admit: 2012-09-30 | Discharge: 2012-09-30 | Disposition: A | Discharge status: Home / Self Care | Payer: 59 | Source: Ambulatory Visit | Attending: Radiation Oncology | Admitting: Radiation Oncology

## 2012-09-30 VITALS — BP 121/90 | HR 90 | Temp 97.6°F | Ht 67.0 in | Wt 180.6 lb

## 2012-09-30 DIAGNOSIS — C187 Malignant neoplasm of sigmoid colon: Secondary | ICD-10-CM

## 2012-09-30 MED ORDER — OXYCODONE-ACETAMINOPHEN 5-325 MG PO TABS: 1.0000 | ORAL_TABLET | ORAL | Status: DC | PRN

## 2012-09-30 NOTE — Progress Notes (Signed)
   Department of Radiation Oncology  Phone:  [phone] Fax:        [phone]  Weekly Treatment Note    Name: Sheri Calderon Date: 09/30/2012 MRN: [id number] DOB: 19-Jun-1965   Current dose: 7.2 Gy  Current fraction: 4   MEDICATIONS: Current Outpatient Prescriptions  Medication Sig Dispense Refill  . ALPRAZolam (XANAX) 0.5 MG tablet TAKE 1 TABLET BY MOUTH TWICE A DAY AS NEEDED FOR ANXIETY/SLEEP  60 tablet  1  . capecitabine (XELODA) 500 MG tablet Take 3 tablets (1,500 mg total) by mouth 2 (two) times daily after a meal. Take on days of radiation only. (M-F)  120 tablet  0  . escitalopram (LEXAPRO) 5 MG tablet Take 1 tablet (5 mg total) by mouth daily. Call office with update in 2-3 weeks  30 tablet  0  . lidocaine-prilocaine (EMLA) cream Apply topically as needed. Apply 1 teaspoon to PAC site 1-2 hours prior to stick and cover with plastic wrap to numb site  30 g  prn  . LORazepam (ATIVAN) 0.5 MG tablet Take 1 tablet (0.5 mg total) by mouth every 6 (six) hours as needed (nausea).  60 tablet  0  . ondansetron (ZOFRAN) 8 MG tablet Take 1 tablet (8 mg total) by mouth every 8 (eight) hours as needed for nausea.  30 tablet  1  . dexamethasone (DECADRON) 4 MG tablet Take 1 tablet (4 mg total) by mouth 2 (two) times daily with a meal.  8 tablet  1  . oxyCODONE-acetaminophen (PERCOCET/ROXICET) 5-325 MG per tablet Take 1 tablet by mouth every 4 (four) hours as needed for pain.  60 tablet  0   No current facility-administered medications for this encounter.     ALLERGIES: Lorazepam   LABORATORY DATA:  Lab Results  Component Value Date   WBC 3.2* 09/22/2012   HGB 11.2* 09/22/2012   HCT 32.1* 09/22/2012   MCV 85.6 09/22/2012   PLT 159 09/22/2012   Lab Results  Component Value Date   NA 140 09/08/2012   K 3.4* 09/08/2012   CL 105 08/11/2012   CO2 24 09/08/2012   Lab Results  Component Value Date   ALT 100* 09/08/2012   AST 59* 09/08/2012   ALKPHOS 86 09/08/2012   BILITOT 0.33  09/08/2012     NARRATIVE: Sheri Calderon was seen today for weekly treatment management. The chart was checked and the patient's films were reviewed. The patient is doing satisfactorily with treatment. She is complaining of some lower back pain which has been present prior to treatment. She states that the Lortab is not working as well as Ambulance person which she took previously for this.  PHYSICAL EXAMINATION: height is 5' 7"  (1.702 m) and weight is 180 lb 9.6 oz (81.92 kg). Her temperature is 97.6 F (36.4 C). Her blood pressure is 121/90 and her pulse is 90.        ASSESSMENT: The patient is doing satisfactorily with treatment.  PLAN: We will continue with the patient's radiation treatment as planned. She has been given a prescription for Percocet and Lortab/Norco has been discontinued.

## 2012-09-30 NOTE — Progress Notes (Addendum)
Sheri Calderon here for weekly under treat visit.  She has had 4 fractions to her pelvis.  She is having pain in her lower back that she is rating at an 8/10.  She also has pressure in her abdomen that feels like a UTI.  She is currently taking Norco which is not working.  She would like to go back to percocet.  She is taking Xeloda and is having nausea.  She reports that she is able to eat in the mornings but by the evening, she is not able to eat.  She is also reporting memory issues.  She had to call twice today to find out her appt time.  She is fatigued.  She is having diarrhea and started taking imodium yesterday.

## 2012-10-03 ENCOUNTER — Ambulatory Visit
Admission: RE | Admit: 2012-10-03 | Discharge: 2012-10-03 | Disposition: A | Discharge status: Home / Self Care | Payer: 59 | Source: Ambulatory Visit | Attending: Radiation Oncology | Admitting: Radiation Oncology

## 2012-10-04 ENCOUNTER — Ambulatory Visit
Admission: RE | Admit: 2012-10-04 | Discharge: 2012-10-04 | Disposition: A | Discharge status: Home / Self Care | Payer: 59 | Source: Ambulatory Visit | Attending: Radiation Oncology | Admitting: Radiation Oncology

## 2012-10-05 ENCOUNTER — Ambulatory Visit
Admission: RE | Admit: 2012-10-05 | Discharge: 2012-10-05 | Disposition: A | Discharge status: Home / Self Care | Payer: 59 | Source: Ambulatory Visit | Attending: Radiation Oncology | Admitting: Radiation Oncology

## 2012-10-05 VITALS — BP 133/90 | HR 76 | Temp 97.6°F | Ht 67.0 in | Wt 181.9 lb

## 2012-10-05 DIAGNOSIS — C187 Malignant neoplasm of sigmoid colon: Secondary | ICD-10-CM

## 2012-10-05 NOTE — Progress Notes (Signed)
Sheri Calderon here for weekly under treat visit.  She has had 7 fractions to her pelvis.  She is rating her pain today at 10/10 at times in her rectal area after bowel movements.  She states that she is having 10 loose stools per day.  She has been using Imodium twice a day and it is not helping.  She states it gets worse as the week progress.  She also has nausea and is taking zofran which is helping.  She is needing a refill on oxycodone/acetaminophen and her zofran.  The skin in her rectal area is intact.  She is also having pain in her joints, especially her shoulders and hips.  She is very fatigued.

## 2012-10-05 NOTE — Progress Notes (Signed)
   Department of Radiation Oncology  Phone:  [phone] Fax:        [phone]  Weekly Treatment Note    Name: Sheri Calderon Date: 10/05/2012 MRN: [id number] DOB: 04-09-1965   Current dose: 12.6 Gy  Current fraction:7   MEDICATIONS: Current Outpatient Prescriptions  Medication Sig Dispense Refill  . ALPRAZolam (XANAX) 0.5 MG tablet TAKE 1 TABLET BY MOUTH TWICE A DAY AS NEEDED FOR ANXIETY/SLEEP  60 tablet  1  . capecitabine (XELODA) 500 MG tablet Take 3 tablets (1,500 mg total) by mouth 2 (two) times daily after a meal. Take on days of radiation only. (M-F)  120 tablet  0  . dexamethasone (DECADRON) 4 MG tablet Take 1 tablet (4 mg total) by mouth 2 (two) times daily with a meal.  8 tablet  1  . escitalopram (LEXAPRO) 5 MG tablet Take 1 tablet (5 mg total) by mouth daily. Call office with update in 2-3 weeks  30 tablet  0  . lidocaine-prilocaine (EMLA) cream Apply topically as needed. Apply 1 teaspoon to PAC site 1-2 hours prior to stick and cover with plastic wrap to numb site  30 g  prn  . ondansetron (ZOFRAN) 8 MG tablet Take 1 tablet (8 mg total) by mouth every 8 (eight) hours as needed for nausea.  30 tablet  1  . oxyCODONE-acetaminophen (PERCOCET/ROXICET) 5-325 MG per tablet Take 1 tablet by mouth every 4 (four) hours as needed for pain.  60 tablet  0  . LORazepam (ATIVAN) 0.5 MG tablet Take 1 tablet (0.5 mg total) by mouth every 6 (six) hours as needed (nausea).  60 tablet  0   No current facility-administered medications for this encounter.     ALLERGIES: Lorazepam   LABORATORY DATA:  Lab Results  Component Value Date   WBC 3.2* 09/22/2012   HGB 11.2* 09/22/2012   HCT 32.1* 09/22/2012   MCV 85.6 09/22/2012   PLT 159 09/22/2012   Lab Results  Component Value Date   NA 140 09/08/2012   K 3.4* 09/08/2012   CL 105 08/11/2012   CO2 24 09/08/2012   Lab Results  Component Value Date   ALT 100* 09/08/2012   AST 59* 09/08/2012   ALKPHOS 86 09/08/2012   BILITOT 0.33  09/08/2012     NARRATIVE: Sheri Calderon was seen today for weekly treatment management. The chart was checked and the patient's films were reviewed. The patient is having significant diarrhea which is her main complaint today. She tried Imodium but will use this a couple of times. She is using Zofran with his controlling her nausea well.  PHYSICAL EXAMINATION: height is 5' 7"  (1.702 m) and weight is 181 lb 14.4 oz (82.509 kg). Her temperature is 97.6 F (36.4 C). Her blood pressure is 133/90 and her pulse is 76.        ASSESSMENT: The patient is doing satisfactorily with treatment.  PLAN: We will continue with the patient's radiation treatment as planned.  The patient will increase her Imodiumup to 8 tablets per day. I discussed how to increase this slowly. She will let us know if this persists.

## 2012-10-05 NOTE — Addendum Note (Signed)
Encounter addended by: Marye Round, MD on: 10/05/2012 11:10 AM<BR>     Documentation filed: Visit Diagnoses, Notes Section

## 2012-10-05 NOTE — Progress Notes (Signed)
  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 The patient was placed in a supine position. A customized alpha cradle was constructed toaid in patient immobilization. This complex treatment device will be used on a daily basis during the treatment. In this fashion a CT scan was obtained through the pelvic region and the isocenter was placed near midline within the pelvis.  The patient will initially be planned to receive a course of radiation to a dose of 45 gray. This will be accomplished in 25 fractions at 1.8 gray per fraction. This initial treatment will correspond to a 3-D conformal technique. The gross tumor volume has been contoured in addition to the rectum, bladder and femoral heads. DVH's of each of these structures have been requested and these will be carefully reviewed as part of the 3-D conformal treatment planning process. To accomplish this initial treatment, 4 customized blocks have been designed for this purpose. Each of these 4 complex treatment devices will be used on a daily basis during the initial course of his treatment. It is anticipated that the patient will then receive a boost for an additional 5.4 gray. The anticipated total dose therefore will be 50.4 gray.  Special treatment procedure The patient will receive chemotherapy during the course of radiation treatment. The patient may experience increased or overlapping toxicity due to this combined-modality approach and the patient will be monitored for such problems. This may include extra lab work as necessary. This therefore constitutes a special treatment procedure.    ________________________________  Jodelle Gross, MD, PhD

## 2012-10-06 ENCOUNTER — Ambulatory Visit
Admission: RE | Admit: 2012-10-06 | Discharge: 2012-10-06 | Disposition: A | Discharge status: Home / Self Care | Payer: 59 | Source: Ambulatory Visit | Attending: Radiation Oncology | Admitting: Radiation Oncology

## 2012-10-07 ENCOUNTER — Ambulatory Visit: Admission: RE | Admit: 2012-10-07 | Payer: 59 | Source: Ambulatory Visit

## 2012-10-07 ENCOUNTER — Ambulatory Visit
Admission: RE | Admit: 2012-10-07 | Discharge: 2012-10-07 | Disposition: A | Discharge status: Home / Self Care | Payer: 59 | Source: Ambulatory Visit | Attending: Radiation Oncology | Admitting: Radiation Oncology

## 2012-10-10 ENCOUNTER — Ambulatory Visit
Admission: RE | Admit: 2012-10-10 | Discharge: 2012-10-10 | Disposition: A | Discharge status: Home / Self Care | Payer: 59 | Source: Ambulatory Visit | Attending: Radiation Oncology | Admitting: Radiation Oncology

## 2012-10-10 ENCOUNTER — Other Ambulatory Visit: Payer: Self-pay | Admitting: Radiation Oncology

## 2012-10-10 MED ORDER — OXYCODONE-ACETAMINOPHEN 5-325 MG PO TABS: 1.0000 | ORAL_TABLET | ORAL | Status: DC | PRN

## 2012-10-11 ENCOUNTER — Ambulatory Visit
Admission: RE | Admit: 2012-10-11 | Discharge: 2012-10-11 | Disposition: A | Discharge status: Home / Self Care | Payer: 59 | Source: Ambulatory Visit | Attending: Radiation Oncology | Admitting: Radiation Oncology

## 2012-10-12 ENCOUNTER — Ambulatory Visit
Admission: RE | Admit: 2012-10-12 | Discharge: 2012-10-12 | Disposition: A | Discharge status: Home / Self Care | Payer: 59 | Source: Ambulatory Visit | Attending: Radiation Oncology | Admitting: Radiation Oncology

## 2012-10-13 ENCOUNTER — Ambulatory Visit
Admission: RE | Admit: 2012-10-13 | Discharge: 2012-10-13 | Disposition: A | Discharge status: Home / Self Care | Payer: 59 | Source: Ambulatory Visit | Attending: Radiation Oncology | Admitting: Radiation Oncology

## 2012-10-13 ENCOUNTER — Encounter: Payer: Self-pay | Admitting: Radiation Oncology

## 2012-10-13 VITALS — BP 133/86 | HR 88 | Temp 97.5°F | Resp 20 | Wt 189.7 lb

## 2012-10-13 DIAGNOSIS — C187 Malignant neoplasm of sigmoid colon: Secondary | ICD-10-CM

## 2012-10-13 NOTE — Progress Notes (Signed)
Weekly rad tx pelvis  13/25 completed, c/o muscle pain 2-3 scale, in calves, percocet helps ; has gained 8 lb 1 week, no c/o pain in rectal area, using imodim daily, stools are better, nausea and taking zofran daily as well,   10:37 AM

## 2012-10-13 NOTE — Progress Notes (Signed)
   Department of Radiation Oncology  Phone:  [phone] Fax:        [phone]  Weekly Treatment Note    Name: Sheri Calderon Date: 10/13/2012 MRN: [id number] DOB: 01-21-1966   Current dose: 23.4 Gy  Current fraction: 13   MEDICATIONS: Current Outpatient Prescriptions  Medication Sig Dispense Refill  . ALPRAZolam (XANAX) 0.5 MG tablet TAKE 1 TABLET BY MOUTH TWICE A DAY AS NEEDED FOR ANXIETY/SLEEP  60 tablet  1  . capecitabine (XELODA) 500 MG tablet Take 3 tablets (1,500 mg total) by mouth 2 (two) times daily after a meal. Take on days of radiation only. (M-F)  120 tablet  0  . escitalopram (LEXAPRO) 5 MG tablet Take 1 tablet (5 mg total) by mouth daily. Call office with update in 2-3 weeks  30 tablet  0  . ondansetron (ZOFRAN) 8 MG tablet Take 1 tablet (8 mg total) by mouth every 8 (eight) hours as needed for nausea.  30 tablet  1  . oxyCODONE-acetaminophen (PERCOCET/ROXICET) 5-325 MG per tablet Take 1 tablet by mouth every 4 (four) hours as needed for pain.  60 tablet  0  . dexamethasone (DECADRON) 4 MG tablet Take 1 tablet (4 mg total) by mouth 2 (two) times daily with a meal.  8 tablet  1  . lidocaine-prilocaine (EMLA) cream Apply topically as needed. Apply 1 teaspoon to PAC site 1-2 hours prior to stick and cover with plastic wrap to numb site  30 g  prn  . LORazepam (ATIVAN) 0.5 MG tablet Take 1 tablet (0.5 mg total) by mouth every 6 (six) hours as needed (nausea).  60 tablet  0   No current facility-administered medications for this encounter.     ALLERGIES: Lorazepam   LABORATORY DATA:  Lab Results  Component Value Date   WBC 3.2* 09/22/2012   HGB 11.2* 09/22/2012   HCT 32.1* 09/22/2012   MCV 85.6 09/22/2012   PLT 159 09/22/2012   Lab Results  Component Value Date   NA 140 09/08/2012   K 3.4* 09/08/2012   CL 105 08/11/2012   CO2 24 09/08/2012   Lab Results  Component Value Date   ALT 100* 09/08/2012   AST 59* 09/08/2012   ALKPHOS 86 09/08/2012   BILITOT 0.33  09/08/2012     NARRATIVE: Sheri Calderon was seen today for weekly treatment management. The chart was checked and the patient's films were reviewed. The patient is doing better this week. Nausea is well controlled. No pain in rectal area.  PHYSICAL EXAMINATION: weight is 189 lb 11.2 oz (86.047 kg). Her oral temperature is 97.5 F (36.4 C). Her blood pressure is 133/86 and her pulse is 88. Her respiration is 20.        ASSESSMENT: The patient is doing satisfactorily with treatment.  PLAN: We will continue with the patient's radiation treatment as planned.

## 2012-10-14 ENCOUNTER — Other Ambulatory Visit (HOSPITAL_BASED_OUTPATIENT_CLINIC_OR_DEPARTMENT_OTHER): Payer: 59 | Admitting: Lab

## 2012-10-14 ENCOUNTER — Telehealth: Payer: Self-pay | Admitting: *Deleted

## 2012-10-14 ENCOUNTER — Ambulatory Visit
Admission: RE | Admit: 2012-10-14 | Discharge: 2012-10-14 | Disposition: A | Discharge status: Home / Self Care | Payer: 59 | Source: Ambulatory Visit | Attending: Radiation Oncology | Admitting: Radiation Oncology

## 2012-10-14 ENCOUNTER — Telehealth: Payer: Self-pay

## 2012-10-14 ENCOUNTER — Ambulatory Visit (HOSPITAL_BASED_OUTPATIENT_CLINIC_OR_DEPARTMENT_OTHER): Payer: 59 | Admitting: Nurse Practitioner

## 2012-10-14 VITALS — BP 162/96 | HR 78 | Temp 96.9°F | Resp 19 | Ht 67.0 in | Wt 189.0 lb

## 2012-10-14 DIAGNOSIS — C187 Malignant neoplasm of sigmoid colon: Secondary | ICD-10-CM

## 2012-10-14 DIAGNOSIS — C189 Malignant neoplasm of colon, unspecified: Secondary | ICD-10-CM

## 2012-10-14 DIAGNOSIS — M533 Sacrococcygeal disorders, not elsewhere classified: Secondary | ICD-10-CM

## 2012-10-14 DIAGNOSIS — R109 Unspecified abdominal pain: Secondary | ICD-10-CM

## 2012-10-14 LAB — CBC WITH DIFFERENTIAL/PLATELET
BASO%: 0.3 % (ref 0.0–2.0)
HCT: 33.8 % — ABNORMAL LOW (ref 34.8–46.6)
HGB: 11 g/dL — ABNORMAL LOW (ref 11.6–15.9)
LYMPH%: 13.1 % — ABNORMAL LOW (ref 14.0–49.7)
MCH: 28.4 pg (ref 25.1–34.0)
MCHC: 32.5 g/dL (ref 31.5–36.0)
MCV: 87.3 fL (ref 79.5–101.0)
MONO#: 0.3 10*3/uL (ref 0.1–0.9)
MONO%: 7 % (ref 0.0–14.0)
NEUT#: 2.8 10*3/uL (ref 1.5–6.5)
NEUT%: 70.8 % (ref 38.4–76.8)
Platelets: 170 10*3/uL (ref 145–400)

## 2012-10-14 LAB — COMPREHENSIVE METABOLIC PANEL (CC13)
ALT: 26 U/L (ref 0–55)
CO2: 23 mEq/L (ref 22–29)
Creatinine: 0.8 mg/dL (ref 0.6–1.1)
Total Bilirubin: 0.32 mg/dL (ref 0.20–1.20)

## 2012-10-14 NOTE — Telephone Encounter (Signed)
Per staff phone call and POF I have schedueld appts.  [name]  

## 2012-10-14 NOTE — Progress Notes (Addendum)
OFFICE PROGRESS NOTE  Interval history:  Sheri Calderon returns as scheduled. She began radiation and Xeloda on 09/26/2012. She is having intermittent nausea relieved by Zofran. She estimates 2 loose stools a day. She takes Imodium as needed. No mouth sores. No hand or foot pain or redness. Since beginning radiation she has noted pain at the "tailbone". The pain begins about 30 minutes after completing the radiation treatment and is significantly better prior to the next treatment. She does not have the pain on the days she does not take radiation. She is also having pain around the umbilicus. She had similar pain following surgery. The pain recently recurred. She is taking Percocet 3-4 times a day. She denies fever.   Objective: Blood pressure 162/96, pulse 78, temperature 96.9 F (36.1 C), temperature source Oral, resp. rate 19, height 5' 7"  (1.702 m), weight 189 lb (85.73 kg).  Oropharynx is without thrush or ulceration. Lungs are clear. Regular cardiac rhythm. Port-A-Cath site is without erythema. Abdomen is soft. No hepatomegaly. Nondistended. Bowel sounds active. Tender to the right of the umbilicus. No mass. Marked tenderness over the coccyx. No erythema or induration. Legs without edema. Palms and soles without erythema.  Lab Results: Lab Results  Component Value Date   WBC 3.9 10/14/2012   HGB 11.0* 10/14/2012   HCT 33.8* 10/14/2012   MCV 87.3 10/14/2012   PLT 170 10/14/2012    Chemistry:    Chemistry      Component Value Date/Time   NA 142 10/14/2012 0913   NA 137 07/03/2012 0625   K 3.7 10/14/2012 0913   K 3.4* 07/03/2012 0625   CL 105 08/11/2012 0941   CL 102 07/03/2012 0625   CO2 23 10/14/2012 0913   CO2 29 07/03/2012 0625   BUN 10.3 10/14/2012 0913   BUN <3* 07/03/2012 0625   CREATININE 0.8 10/14/2012 0913   CREATININE 0.68 07/03/2012 0625      Component Value Date/Time   CALCIUM 8.7 10/14/2012 0913   CALCIUM 8.7 07/03/2012 0625   ALKPHOS 61 10/14/2012 0913   ALKPHOS 63 06/07/2012 1938   AST  23 10/14/2012 0913   AST 10 06/07/2012 1938   ALT 26 10/14/2012 0913   ALT 9 06/07/2012 1938   BILITOT 0.32 10/14/2012 0913   BILITOT 0.3 06/07/2012 1938       Studies/Results: No results found.  Medications: I have reviewed the patient's current medications.  Assessment/Plan:  1. 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 The tumor is associated with a bowel perforation. Tumor focally involved a radial surgical margin. She completed cycle 1 adjuvant FOLFOX 07/28/2012; cycle 2 08/11/2012; cycle 3 08/25/2012; cycle 4 09/08/2012. She began Xeloda/radiation 09/26/2012. 2. Bowel perforation/diverticulitis secondary to #1. 3. History of atrial fibrillation. 4. Family history of cancer. The sigmoid colon tumor 06/28/2012 was microsatellite stable and without loss of mismatch repair proteins. 5. Status post Port-A-Cath placement 07/11/2012. 6. Nausea and vomiting following cycle 1 FOLFOX. Aloxi was added with cycle 2. 7. Depression. She is on Lexapro. 8. "Muscle aches" following chemotherapy. ? manifestation of oxaliplatin neuropathy. She takes hydrocodone as needed. 9. Abdominal pain-this is most likely not related to chemotherapy. The pain is better. 10. 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 11. Neutropenia related to recent chemotherapy. 12. Pain/tenderness at the coccyx. The pain has occurred since beginning radiation and worsens approximately 30 minutes after each radiation treatment. She  will discuss with Dr. Lisbeth Renshaw. 13. Pain to the right of the umbilicus. Question related to surgery.  Disposition-Sheri Calderon appears to be tolerating the Xeloda well. She will complete the course of radiation on 11/04/2012. Dr. Benay Spice recommends resuming FOLFOX on 11/24/2012. We will see her in followup prior to treatment on 11/24/2012. She will contact the  office in the interim with any problems. We specifically discussed worsening pain, fever. Dr. Benay Spice feels it is unlikely the pain she is experiencing is related to Xeloda.  Patient seen with Dr. Benay Spice.  Ned Card ANP/GNP-BC    Sheri Calderon was interviewed and examined. This was a shared visit with Ned Card  She complains of pain at the coccyx and right periumbilical areas.no abnormality on physical exam. I cannot relate the pain to Xeloda.  We recommended she followup with Dr. Ninfa Linden to evaluate the persistent abdominal pain. She will discuss the coccyx pain with Dr. Lisbeth Renshaw.  Sheri Calderon will be scheduled for an office visit in medical oncology prior to resuming adjuvant FOLFOX on 11/24/2012. She will contact us with worsening pain or fever and we will consider an abdomen/pelvic CT to look for evidence of an infection.  Julieanne Manson, M.D.

## 2012-10-14 NOTE — Telephone Encounter (Signed)
s.w. pt and advised on all appts...pt ok and awre

## 2012-10-15 ENCOUNTER — Other Ambulatory Visit: Payer: Self-pay | Admitting: Hematology & Oncology

## 2012-10-17 ENCOUNTER — Telehealth: Payer: Self-pay | Admitting: *Deleted

## 2012-10-17 ENCOUNTER — Other Ambulatory Visit: Payer: Self-pay | Admitting: Oncology

## 2012-10-17 ENCOUNTER — Ambulatory Visit
Admission: RE | Admit: 2012-10-17 | Discharge: 2012-10-17 | Disposition: A | Discharge status: Home / Self Care | Payer: 59 | Source: Ambulatory Visit | Attending: Radiation Oncology | Admitting: Radiation Oncology

## 2012-10-17 NOTE — Telephone Encounter (Signed)
Call from pt's husband stating he isn't satisfied with pt's treatment here. Stated her pain is not being controlled. Pt does not want to resume chemo, she feels she is allergic to it since she woke up Saturday with swelling in her face.  Returned call to pt she reports swelling has subsided. Pt became tearful, stated the pain at her coccyx is unbearable, she is taking  2 Oxycodone 5/325 every 3-4 hours for pain. Requests something stronger for pain. Will review with MD. Instructed pt to ask to speak to RN when she checks in for radiation on 8/19. She voiced understanding.

## 2012-10-17 NOTE — Telephone Encounter (Signed)
Call from pt reporting she woke up Saturday morning with swelling to her face. Called the on-call MD and was told to [place]. Pt takes Xeloda M-F with radiation. Reviewed with Dr. Benay Spice: MD recommends to resume Xeloda. Swelling was not likely related to chemo.

## 2012-10-18 ENCOUNTER — Telehealth: Payer: Self-pay | Admitting: *Deleted

## 2012-10-18 ENCOUNTER — Ambulatory Visit
Admission: RE | Admit: 2012-10-18 | Discharge: 2012-10-18 | Disposition: A | Discharge status: Home / Self Care | Payer: 59 | Source: Ambulatory Visit | Attending: Radiation Oncology | Admitting: Radiation Oncology

## 2012-10-18 DIAGNOSIS — C187 Malignant neoplasm of sigmoid colon: Secondary | ICD-10-CM

## 2012-10-18 MED ORDER — OXYCODONE-ACETAMINOPHEN 5-325 MG PO TABS: 1.0000 | ORAL_TABLET | ORAL | Status: DC | PRN

## 2012-10-18 NOTE — Telephone Encounter (Signed)
Pt in to speak with RN. No facial swelling today. No swelling noted in neck or arm.  She reports she is hesitant to resume Xeloda per Dr. Gearldine Shown recommendation. Teaching reviewed re: chemoradiation and efficacy without 5FU. Pt voiced understanding. Pt remains concerned re: coccyx pain. Requests refill on pain medication. Reviewed with Dr. Benay Spice: Ordered CT abd/pelvis to evaluate pain. Refill Oxycodone. Discussed facial swelling with pharmacy, not likely related to Xeloda at this point in the cycle. Pt stated she would resume Xeloda this evening. Instructed her to STOP Xeloda and take Benadryl if she notes any facial edema, rash or itching. Call office for any symptoms of allergic reaction. She voiced understanding.

## 2012-10-18 NOTE — Telephone Encounter (Signed)
Called pt after she left office. Per Dr. Benay Spice: Take Xeloda today during the day, call office for any sign of reaction. Skip tonight's dose. If no reaction resume original BID schedule. Pt voiced understanding.

## 2012-10-19 ENCOUNTER — Telehealth: Payer: Self-pay | Admitting: *Deleted

## 2012-10-19 ENCOUNTER — Ambulatory Visit
Admission: RE | Admit: 2012-10-19 | Discharge: 2012-10-19 | Disposition: A | Discharge status: Home / Self Care | Payer: 59 | Source: Ambulatory Visit | Attending: Radiation Oncology | Admitting: Radiation Oncology

## 2012-10-19 NOTE — Telephone Encounter (Signed)
[facility] faxed capcitabine refill request.  Request to provider for review.

## 2012-10-20 ENCOUNTER — Ambulatory Visit
Admission: RE | Admit: 2012-10-20 | Discharge: 2012-10-20 | Disposition: A | Discharge status: Home / Self Care | Payer: 59 | Source: Ambulatory Visit | Attending: Radiation Oncology | Admitting: Radiation Oncology

## 2012-10-20 ENCOUNTER — Ambulatory Visit (HOSPITAL_COMMUNITY)
Admission: RE | Admit: 2012-10-20 | Discharge: 2012-10-20 | Disposition: A | Discharge status: Home / Self Care | Payer: 59 | Source: Ambulatory Visit | Attending: Oncology | Admitting: Oncology

## 2012-10-20 DIAGNOSIS — N281 Cyst of kidney, acquired: Secondary | ICD-10-CM | POA: Insufficient documentation

## 2012-10-20 DIAGNOSIS — M533 Sacrococcygeal disorders, not elsewhere classified: Secondary | ICD-10-CM | POA: Insufficient documentation

## 2012-10-20 DIAGNOSIS — N83209 Unspecified ovarian cyst, unspecified side: Secondary | ICD-10-CM | POA: Insufficient documentation

## 2012-10-20 DIAGNOSIS — K561 Intussusception: Secondary | ICD-10-CM | POA: Insufficient documentation

## 2012-10-20 DIAGNOSIS — C187 Malignant neoplasm of sigmoid colon: Secondary | ICD-10-CM | POA: Insufficient documentation

## 2012-10-20 MED ORDER — IOHEXOL 300 MG/ML  SOLN
100.0000 mL | Freq: Once | INTRAMUSCULAR | Status: AC | PRN
  Administered 2012-10-20: 100 mL via INTRAVENOUS

## 2012-10-21 ENCOUNTER — Ambulatory Visit
Admission: RE | Admit: 2012-10-21 | Discharge: 2012-10-21 | Disposition: A | Discharge status: Home / Self Care | Payer: 59 | Source: Ambulatory Visit | Attending: Radiation Oncology | Admitting: Radiation Oncology

## 2012-10-21 ENCOUNTER — Telehealth: Payer: Self-pay | Admitting: *Deleted

## 2012-10-21 ENCOUNTER — Encounter (INDEPENDENT_AMBULATORY_CARE_PROVIDER_SITE_OTHER): Payer: Self-pay | Admitting: Surgery

## 2012-10-21 ENCOUNTER — Other Ambulatory Visit: Payer: Self-pay | Admitting: *Deleted

## 2012-10-21 ENCOUNTER — Ambulatory Visit (HOSPITAL_BASED_OUTPATIENT_CLINIC_OR_DEPARTMENT_OTHER): Payer: 59

## 2012-10-21 ENCOUNTER — Other Ambulatory Visit: Payer: Self-pay | Admitting: Medical Oncology

## 2012-10-21 ENCOUNTER — Ambulatory Visit (INDEPENDENT_AMBULATORY_CARE_PROVIDER_SITE_OTHER): Payer: 59 | Admitting: Surgery

## 2012-10-21 VITALS — BP 123/73 | HR 89 | Temp 97.6°F | Resp 20

## 2012-10-21 VITALS — BP 124/80 | HR 84 | Resp 18 | Ht 67.5 in | Wt 182.4 lb

## 2012-10-21 DIAGNOSIS — Z452 Encounter for adjustment and management of vascular access device: Secondary | ICD-10-CM

## 2012-10-21 DIAGNOSIS — R109 Unspecified abdominal pain: Secondary | ICD-10-CM

## 2012-10-21 DIAGNOSIS — C189 Malignant neoplasm of colon, unspecified: Secondary | ICD-10-CM

## 2012-10-21 MED ORDER — CAPECITABINE 500 MG PO TABS: 1500.0000 mg | ORAL_TABLET | Freq: Two times a day (BID) | ORAL | Status: DC

## 2012-10-21 MED ORDER — SODIUM CHLORIDE 0.9 % IJ SOLN
10.0000 mL | INTRAMUSCULAR | Status: DC | PRN
  Administered 2012-10-21: 10 mL via INTRAVENOUS
  Filled 2012-10-21: qty 10

## 2012-10-21 MED ORDER — HEPARIN SOD (PORK) LOCK FLUSH 100 UNIT/ML IV SOLN
500.0000 [IU] | Freq: Once | INTRAVENOUS | Status: AC
  Administered 2012-10-21: 500 [IU] via INTRAVENOUS
  Filled 2012-10-21: qty 5

## 2012-10-21 MED ORDER — OXYCODONE-ACETAMINOPHEN 5-325 MG PO TABS: 1.0000 | ORAL_TABLET | ORAL | Status: DC | PRN

## 2012-10-21 NOTE — Telephone Encounter (Signed)
THIS REFILL REQUEST FOR CAPECITABINE WAS GIVEN TO DR.SHERRILL'S NURSE, TANYA WHITLOCK,RN.

## 2012-10-21 NOTE — Patient Instructions (Signed)
Today you had your port flushed. Continue to have flushed every 6-8 weeks

## 2012-10-21 NOTE — Progress Notes (Signed)
Subjective:     Patient ID: Sheri Calderon, female   DOB: [birth year], 47 y.o.   MRN: [id number]  HPI She is here for a followup status post partial colectomy for extensive colon cancer. She is currently undergoing both radiation and chemotherapy. She has intermittent pain at the coccyx and perianal area from diarrhea daily. She has intermittent cramping abdominal pain but no nausea or vomiting.  Review of Systems     Objective:   Physical Exam Her abdomen is soft and nontender today. There is no evidence of hernia.  The CT of the abdomen and pelvis showed only mild postoperative changes. There were 2 questionable areas of intussusception but I believe these are false findings    Assessment:     Patient stable with history of colon cancer     Plan:     I will renew her Percocet and will continue to follow her closely.

## 2012-10-21 NOTE — Telephone Encounter (Signed)
Called pt, she denied any issues with edema/ rash or other symptoms of allergic reaction. Has resumed regular scheduled dosing of Xeloda. Will request refill to complete remainder of radiation treatments. Pt understands to call office if any issues arise.

## 2012-10-24 ENCOUNTER — Ambulatory Visit: Payer: 59

## 2012-10-25 ENCOUNTER — Ambulatory Visit
Admission: RE | Admit: 2012-10-25 | Discharge: 2012-10-25 | Disposition: A | Discharge status: Home / Self Care | Payer: 59 | Source: Ambulatory Visit | Attending: Radiation Oncology | Admitting: Radiation Oncology

## 2012-10-25 VITALS — BP 133/85 | HR 86 | Temp 97.6°F | Ht 67.5 in | Wt 180.7 lb

## 2012-10-25 DIAGNOSIS — C187 Malignant neoplasm of sigmoid colon: Secondary | ICD-10-CM

## 2012-10-25 MED ORDER — DIPHENOXYLATE-ATROPINE 2.5-0.025 MG PO TABS: 1.0000 | ORAL_TABLET | Freq: Four times a day (QID) | ORAL | Status: DC | PRN

## 2012-10-25 NOTE — Progress Notes (Addendum)
Sheri Calderon here for weekly under treat visit.  She has had 20 fractions to her pelvis.  She does have pain in her joints (left shoulder, hips and tailbone) that she is rating at a 8/10.  She does take percocet for this.  She is needing a refill on her percocet.  She does have fatigue.  She is currently taking Xeloda 3 tablets in the morning and 3 tablets in the evening.  She is having diarrhea with 10-12 lose stools per day.  She is taking Imodium about 8 times per day.  She is taking Zofran which is controlling her nausea.  She reports a poor appetite.  She has lost 9 lbs since 10/14/2012.  She has been talking to the dietician. She denies urinary changes.  She says her skin is intact in the treatment area.  It does get "raw" with the diarrhea and she uses the sitz bath for relief.  She is also having hot flashes daily since she started radiation treatment.

## 2012-10-25 NOTE — Telephone Encounter (Signed)
RECEIVED A FAX FROM [facility] CONCERNING A CONFIRMATION OF PRESCRIPTION SHIPMENT FOR CAPECITABINE ON 10/24/12.

## 2012-10-25 NOTE — Progress Notes (Signed)
   Weekly Management Note:  Outpatient Current Dose:  36 Gy  Projected Dose: 45 Gy   Narrative:  The patient presents for routine under treatment assessment.  CBCT/MVCT images/Port film x-rays were reviewed.  The chart was checked.  She is asking for a refill on Percocet but did not fill the Rx provided by Dr. Ninfa Linden on 8-22.  She will do this.  She has loose stool 10-12 x / day and take Imodium 8 times a day.  Sitz baths help sore perineum (skin is not peeling or broken, however).  Takes Xeloda.  Physical Findings:  height is 5' 7.5" (1.715 m) and weight is 180 lb 11.2 oz (81.965 kg). Her temperature is 97.6 F (36.4 C). Her blood pressure is 120/89 and her pulse is 84.  NAD.  Not orthostatic per repeat vitals while standing.  CBC    Component Value Date/Time   WBC 3.9 10/14/2012 0913   WBC 4.8 07/03/2012 0625   RBC 3.87 10/14/2012 0913   RBC 3.75* 07/03/2012 0625   HGB 11.0* 10/14/2012 0913   HGB 10.5* 07/03/2012 0625   HCT 33.8* 10/14/2012 0913   HCT 30.7* 07/03/2012 0625   PLT 170 10/14/2012 0913   PLT 460* 07/03/2012 0625   MCV 87.3 10/14/2012 0913   MCV 81.9 07/03/2012 0625   MCH 28.4 10/14/2012 0913   MCH 28.0 07/03/2012 0625   MCHC 32.5 10/14/2012 0913   MCHC 34.2 07/03/2012 0625   RDW 17.4* 10/14/2012 0913   RDW 12.9 07/03/2012 0625   LYMPHSABS 0.5* 10/14/2012 0913   LYMPHSABS 1.8 06/20/2012 1408   MONOABS 0.3 10/14/2012 0913   MONOABS 0.7 06/20/2012 1408   EOSABS 0.3 10/14/2012 0913   EOSABS 0.3 06/20/2012 1408   BASOSABS 0.0 10/14/2012 0913   BASOSABS 0.1 06/20/2012 1408     CMP     Component Value Date/Time   NA 142 10/14/2012 0913   NA 137 07/03/2012 0625   K 3.7 10/14/2012 0913   K 3.4* 07/03/2012 0625   CL 105 08/11/2012 0941   CL 102 07/03/2012 0625   CO2 23 10/14/2012 0913   CO2 29 07/03/2012 0625   GLUCOSE 101 10/14/2012 0913   GLUCOSE 95 08/11/2012 0941   GLUCOSE 107* 07/03/2012 0625   BUN 10.3 10/14/2012 0913   BUN <3* 07/03/2012 0625   CREATININE 0.8 10/14/2012 0913   CREATININE 0.68  07/03/2012 0625   CALCIUM 8.7 10/14/2012 0913   CALCIUM 8.7 07/03/2012 0625   PROT 6.5 10/14/2012 0913   PROT 7.3 06/07/2012 1938   ALBUMIN 3.1* 10/14/2012 0913   ALBUMIN 3.5 06/07/2012 1938   AST 23 10/14/2012 0913   AST 10 06/07/2012 1938   ALT 26 10/14/2012 0913   ALT 9 06/07/2012 1938   ALKPHOS 61 10/14/2012 0913   ALKPHOS 63 06/07/2012 1938   BILITOT 0.32 10/14/2012 0913   BILITOT 0.3 06/07/2012 1938   GFRNONAA >90 07/03/2012 0625   GFRAA >90 07/03/2012 6387     Impression:  The patient is tolerating radiotherapy.   Plan:  Continue radiotherapy as planned. Refill Percocet with Rx from Dr. Ninfa Linden given last week.  Start Lomotil in lieu of Imodium (Rx given).  Continue Sitz baths.  -----------------------------------  Eppie Gibson, MD

## 2012-10-26 ENCOUNTER — Ambulatory Visit
Admission: RE | Admit: 2012-10-26 | Discharge: 2012-10-26 | Disposition: A | Discharge status: Home / Self Care | Payer: 59 | Source: Ambulatory Visit | Attending: Radiation Oncology | Admitting: Radiation Oncology

## 2012-10-27 ENCOUNTER — Encounter: Payer: Self-pay | Admitting: Genetic Counselor

## 2012-10-27 ENCOUNTER — Ambulatory Visit (HOSPITAL_BASED_OUTPATIENT_CLINIC_OR_DEPARTMENT_OTHER): Payer: 59 | Admitting: Genetic Counselor

## 2012-10-27 ENCOUNTER — Other Ambulatory Visit: Payer: 59 | Admitting: Lab

## 2012-10-27 ENCOUNTER — Ambulatory Visit
Admission: RE | Admit: 2012-10-27 | Discharge: 2012-10-27 | Disposition: A | Discharge status: Home / Self Care | Payer: 59 | Source: Ambulatory Visit | Attending: Radiation Oncology | Admitting: Radiation Oncology

## 2012-10-27 ENCOUNTER — Encounter: Payer: Self-pay | Admitting: Radiation Oncology

## 2012-10-27 DIAGNOSIS — C187 Malignant neoplasm of sigmoid colon: Secondary | ICD-10-CM

## 2012-10-27 DIAGNOSIS — Z8041 Family history of malignant neoplasm of ovary: Secondary | ICD-10-CM

## 2012-10-27 DIAGNOSIS — Z800 Family history of malignant neoplasm of digestive organs: Secondary | ICD-10-CM

## 2012-10-27 DIAGNOSIS — IMO0002 Reserved for concepts with insufficient information to code with codable children: Secondary | ICD-10-CM

## 2012-10-27 NOTE — Progress Notes (Signed)
Dr.  Betsy Coder requested a consultation for genetic counseling and risk assessment for Sheri Calderon, a 47 y.o. female, for discussion of her personal history of colon cancer and family history of colon polyps, and ovarian and stomach cancer.  She presents to clinic today to discuss the possibility of a genetic predisposition to cancer, and to further clarify her risks, as well as her family members' risks for cancer.   HISTORY OF PRESENT ILLNESS: In 2014, at the age of 42, Sheri Calderon was diagnosed with cancer of the sigmoid colon. This was treated with colon resection, chemotherapy and radiation.  She reports needing additional chemotherapy after her radiation is complete.  Tumor testing for Lynch syndrome was MSS and no loss of expression for IHC, making Lynch syndrome unlikely.  She reports that she has never had a mammogram, and has never felt a breast lump.   Past Medical History  Diagnosis Date  . Lower extremity edema   . Restless legs syndrome   . Diverticulitis     "this is my 2nd time in hospital w/this in the last 2 wk" (06/21/2012)  . Atrial fibrillation     resolved - does not see cardiologist  . Colon cancer 2014    cancer of the sigmoid colon    Past Surgical History  Procedure Laterality Date  . Tubal ligation  ~ 1989  . Partial colectomy N/A 06/28/2012    Procedure: PARTIAL COLECTOMY;  Surgeon: Harl Bowie, MD;  Location: [facility];  Service: General;  Laterality: N/A;  . Portacath placement N/A 07/11/2012    Procedure: INSERTION PORT-A-CATH;  Surgeon: Harl Bowie, MD;  Location: [facility];  Service: General;  Laterality: N/A;    History   Social History  . Marital Status: Married    Spouse Name: Sherren Mocha    Number of Children: 1  . Years of Education: N/A   Occupational History  .      quality Radio producer   Social History Main Topics  . Smoking status: Former Smoker -- 1.00 packs/day for 15 years    Types: Cigarettes    Quit date:  06/07/2012  . Smokeless tobacco: Never Used  . Alcohol Use: No  . Drug Use: No  . Sexual Activity: Yes    Birth Control/ Protection: Surgical   Other Topics Concern  . 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   premenopausal Uterus Intact: yes Ovaries Intact: yes G1P1A0, first live birth at age 86  She has not previously undergone treatment for infertility.   Oral Contraceptive use: 8 years   She has not used HRT in the past.    FAMILY HISTORY:  We obtained a detailed, 4-generation family history.  Significant diagnoses are listed below: Family History  Problem Relation Age of Onset  . Diabetes Mother   . Heart disease Mother   . Stomach cancer Mother   . Cancer Mother 82    ovarian cancer  . COPD Father   . Hypertension Father   . Hyperlipidemia Father   . Colon polyps Father     62 lifetime colon polyps  . Down syndrome Paternal Aunt   . Cancer Paternal Uncle     cancer in the spine  . COPD  Maternal Grandmother   . Stomach cancer Paternal Grandmother   The patient's brother for the past two years has had blood in his stools.  He underwent a double hernia operation in May 2013, and since then has lost 20 pounds.  He does not have insurance and cannot afford a colonoscopy.  Patient's maternal ancestors are of unknown descent, and paternal ancestors are of cherokee indiann descent. There is no reported Ashkenazi Jewish ancestry. There is no known consanguinity.  GENETIC COUNSELING ASSESSMENT: Sheri Calderon is a 47 y.o. female with a personal history of colon cancer and family history of ovarian and stomach cancer which somewhat suggestive of a hereditary cancer syndrome and predisposition to cancer. We, therefore, discussed and recommended the following  at today's visit.   DISCUSSION: We reviewed the characteristics, features and inheritance patterns of hereditary cancer syndromes. We also discussed genetic testing, including the appropriate family members to test, the process of testing, insurance coverage and turn-around-time for results. We reviewed her MSS/IHC results that suggest that she does not have a Lynch related colon cancer, but that we should look at this anyway as there is a small chance that there is a mutation in an MMR gene that is not picked up on the MSI/IHC testing.  We reviewed other colorectal cancer syndromes as well.  She brought five family members with her today, and we discussed that she should have testing before we start to implement testing on non-affected family members.  In order to estimate her chance of having an MMR gene mutation, we used statistical models (Prem1,2,6) and laboratory data that take into account her personal medical history, family history and ancestry.  Because each model is different, there can be a lot of variability in the risks they give.  Therefore, these numbers must be considered a rough range and not a precise risk of having an MMR gene mutation.  These models estimate that she has approximately a 4.5% chance of having a mutation. Based on this assessment of her family and personal history, genetic testing is recommended.  PLAN: After considering the risks, benefits, and limitations, Sheri Calderon provided informed consent to pursue genetic testing and the blood sample will be sent to [facility] for analysis of the Colon cancer panel. We discussed the implications of a positive, negative and/ or variant of uncertain significance genetic test result. Results should be available within approximately 4-5 weeks' time, at which point they will be disclosed by telephone to Sheri Calderon, as will any additional recommendations warranted by these results. Sheri Calderon will receive a summary of her  genetic counseling visit and a copy of her results once available. This information will also be available in Epic. We encouraged Sheri Calderon to remain in contact with cancer genetics annually so that we can continuously update the family history and inform her of any changes in cancer genetics and testing that may be of benefit for her family. Sheri Calderon's questions were answered to her satisfaction today. Our contact information was provided should additional questions or concerns arise.  The patient was seen for a total of 60 minutes, greater than 50% of which was spent face-to-face counseling.  This plan is being carried out per Dr. Dana Allan recommendations.  This note will also be sent to the referring provider via the electronic medical record. The patient will be supplied with a summary of this genetic counseling discussion as well as educational information on the discussed hereditary cancer  syndromes following the conclusion of their visit.   Patient was discussed with Dr. Marcy Panning.   _______________________________________________________________________ For Office Staff:  Number of people involved in session: 6 Was an Intern/ student involved with case: no

## 2012-10-28 ENCOUNTER — Telehealth: Payer: Self-pay | Admitting: Dietician

## 2012-10-28 ENCOUNTER — Ambulatory Visit
Admission: RE | Admit: 2012-10-28 | Discharge: 2012-10-28 | Disposition: A | Discharge status: Home / Self Care | Payer: 59 | Source: Ambulatory Visit | Attending: Radiation Oncology | Admitting: Radiation Oncology

## 2012-10-28 ENCOUNTER — Other Ambulatory Visit: Payer: Self-pay | Admitting: *Deleted

## 2012-10-28 ENCOUNTER — Ambulatory Visit: Payer: 59 | Admitting: Family Medicine

## 2012-10-28 ENCOUNTER — Telehealth: Payer: Self-pay | Admitting: *Deleted

## 2012-10-28 NOTE — Telephone Encounter (Signed)
THIS REFILL REQUEST FOR CAPECITABINE WAS GIVEN TO DR.SHERRILL'S NURSE, SUSAN COWARD,RN.

## 2012-10-28 NOTE — Telephone Encounter (Signed)
Called patient and confirmed that she has enough Xeloda to complete her radiation therapy. Does not need a refill. Faxed to [facility] that RF not needed.

## 2012-10-31 ENCOUNTER — Ambulatory Visit: Payer: 59

## 2012-11-01 ENCOUNTER — Ambulatory Visit: Payer: 59

## 2012-11-02 ENCOUNTER — Ambulatory Visit
Admission: RE | Admit: 2012-11-02 | Discharge: 2012-11-02 | Disposition: A | Discharge status: Home / Self Care | Payer: 59 | Source: Ambulatory Visit | Attending: Radiation Oncology | Admitting: Radiation Oncology

## 2012-11-03 ENCOUNTER — Ambulatory Visit: Payer: 59

## 2012-11-03 ENCOUNTER — Ambulatory Visit
Admission: RE | Admit: 2012-11-03 | Discharge: 2012-11-03 | Disposition: A | Discharge status: Home / Self Care | Payer: 59 | Source: Ambulatory Visit | Attending: Radiation Oncology | Admitting: Radiation Oncology

## 2012-11-03 DIAGNOSIS — C189 Malignant neoplasm of colon, unspecified: Secondary | ICD-10-CM

## 2012-11-03 MED ORDER — LIDOCAINE VISCOUS 2 % MT SOLN: OROMUCOSAL | Status: DC

## 2012-11-03 MED ORDER — OXYCODONE-ACETAMINOPHEN 5-325 MG PO TABS: 1.0000 | ORAL_TABLET | ORAL | Status: DC | PRN

## 2012-11-03 NOTE — Progress Notes (Signed)
Weekly Management Note Current Dose: 45  Gy  Projected Dose: 45 Gy   Narrative:  The patient presents for routine under treatment assessment.  CBCT/MVCT images/Port film x-rays were reviewed.  The chart was checked. I saw her at the treatment machine at her request.  She complains of moist desquamation in her anal area that she is using neosporin for.  She is taking 5 oxycodone per day for pain. She has no nausea or diarrhea.  She is concerned about her sister who is at [facility] s/p brain surgery  Physical Findings: Moist desquamation in the rectal fold  Impression:  The patient is tolerating radiation.  Plan:  Continue treatment as planned. Will give script for lidocaine and pt requests refill for pain medications which I have done.

## 2012-11-04 ENCOUNTER — Ambulatory Visit: Payer: 59

## 2012-11-04 ENCOUNTER — Encounter: Payer: Self-pay | Admitting: Radiation Oncology

## 2012-11-04 ENCOUNTER — Ambulatory Visit
Admission: RE | Admit: 2012-11-04 | Discharge: 2012-11-04 | Disposition: A | Discharge status: Home / Self Care | Payer: 59 | Source: Ambulatory Visit | Attending: Radiation Oncology | Admitting: Radiation Oncology

## 2012-11-04 VITALS — BP 117/81 | HR 86 | Temp 97.6°F | Resp 20 | Wt 176.8 lb

## 2012-11-04 DIAGNOSIS — C187 Malignant neoplasm of sigmoid colon: Secondary | ICD-10-CM

## 2012-11-04 NOTE — Progress Notes (Signed)
   Department of Radiation Oncology  Phone:  [phone] Fax:        [phone]  Weekly Treatment Note    Name: Sheri Calderon Date: 11/04/2012 MRN: [id number] DOB: March 06, 1965   Current dose: 46.8 Gy  Current fraction: 26   MEDICATIONS: Current Outpatient Prescriptions  Medication Sig Dispense Refill  . ALPRAZolam (XANAX) 0.5 MG tablet TAKE 1 TABLET BY MOUTH TWICE A DAY AS NEEDED FOR ANXIETY/SLEEP  60 tablet  1  . capecitabine (XELODA) 500 MG tablet Take 1,500 mg by mouth 2 (two) times daily after a meal. Take on days of radiation only. (M-F).  Patient states she takes 3 tablets in the morning and 3 tablets at night.      . dexamethasone (DECADRON) 4 MG tablet Take 1 tablet (4 mg total) by mouth 2 (two) times daily with a meal.  8 tablet  1  . diphenoxylate-atropine (LOMOTIL) 2.5-0.025 MG per tablet Take 1 tablet by mouth 4 (four) times daily as needed for diarrhea or loose stools. May take 2 tablets, 4 times daily, if necessary.  60 tablet  0  . lidocaine (XYLOCAINE) 2 % solution Place on irritated area tid prn for pain.  100 mL  0  . lidocaine-prilocaine (EMLA) cream Apply topically as needed. Apply 1 teaspoon to PAC site 1-2 hours prior to stick and cover with plastic wrap to numb site  30 g  prn  . ondansetron (ZOFRAN) 8 MG tablet Take 1 tablet (8 mg total) by mouth every 8 (eight) hours as needed for nausea.  30 tablet  1  . oxyCODONE-acetaminophen (PERCOCET/ROXICET) 5-325 MG per tablet Take 1 tablet by mouth every 4 (four) hours as needed for pain.  60 tablet  0   No current facility-administered medications for this encounter.     ALLERGIES: Lorazepam   LABORATORY DATA:  Lab Results  Component Value Date   WBC 3.9 10/14/2012   HGB 11.0* 10/14/2012   HCT 33.8* 10/14/2012   MCV 87.3 10/14/2012   PLT 170 10/14/2012   Lab Results  Component Value Date   NA 142 10/14/2012   K 3.7 10/14/2012   CL 105 08/11/2012   CO2 23 10/14/2012   Lab Results  Component Value Date   ALT 26 10/14/2012   AST 23 10/14/2012   ALKPHOS 61 10/14/2012   BILITOT 0.32 10/14/2012     NARRATIVE: Sheri Calderon was seen today for weekly treatment management. The chart was checked and the patient's films were reviewed. The patient is having some increased skin irritation. She was given a prescription for lidocaine cream but has been unable to fill this until today. The patient also feels that there is some cramping in her pelvis.  PHYSICAL EXAMINATION: weight is 176 lb 12.8 oz (80.196 kg). Her temperature is 97.6 F (36.4 C). Her blood pressure is 117/81 and her pulse is 86. Her respiration is 20.      some moist desquamation present in the anal region.  ASSESSMENT: The patient is doing satisfactorily with treatment.  PLAN: We will continue with the patient's radiation treatment as planned. The patient will followup with me in 1 month.

## 2012-11-04 NOTE — Progress Notes (Signed)
Pt c/o rectal pain 7-8/10. She is taking pain meds regularly. She states pharmacy did not have lidocaine med yesterday, but she will be able to pick up today. Pt denies bladder, bowel issues, nausea. She is fatigued. Pt has 2 remaining radiation treatments.

## 2012-11-04 NOTE — Progress Notes (Signed)
  Radiation Oncology         [phone] ________________________________  Name: Sheri Calderon MRN: [id number]  Date: 10/27/2012  DOB: [birth year]  COMPLEX SIMULATION  NOTE  Diagnosis: rectal cancer  Narrative The patient has initially been planned to receive a course of radiation treatment to a dose of 45 gray in 25 fractions at 1.8 gray per fraction. The patient will now receive a boost to the high risk target volume for an additional 5.4 gray. This will be delivered in 3 fractions at 1.8 gray per fraction and a cone down boost technique will be utilized. To accomplish this, an additional 4 customized blocks have been designed for this purpose. A complex isodose plan is requested to ensure that the high-risk target region receives the appropriate radiation dose and that the nearby normal structures continue to be appropriately spared. The patient's final total dose therefore will be 50.4 gray.   ________________________________ ------------------------------------------------  Jodelle Gross, MD, PhD

## 2012-11-05 ENCOUNTER — Ambulatory Visit: Payer: 59

## 2012-11-06 NOTE — Addendum Note (Signed)
Encounter addended by: Marye Round, MD on: 11/06/2012  2:55 PM<BR>     Documentation filed: Visit Diagnoses, Notes Section

## 2012-11-06 NOTE — Progress Notes (Signed)
  Radiation Oncology         [phone] ________________________________  Name: Sheri Calderon MRN: [id number]  Date: 11/04/2012  DOB: [birth year]  Simulation Verification Note   NARRATIVE: The patient was brought to the treatment unit and placed in the planned treatment position for the patient's boost treatment. The clinical setup was verified. Then port films were obtained and uploaded to the radiation oncology medical record software.  The treatment beams were carefully compared against the planned radiation fields. The position, location, and shape of the radiation fields was reviewed. The targeted volume of tissue appears to be appropriately covered by the radiation beams. Based on my personal review, I approved the simulation verification. The patient's treatment will proceed as planned.  ________________________________   Jodelle Gross, MD, PhD

## 2012-11-07 ENCOUNTER — Ambulatory Visit
Admission: RE | Admit: 2012-11-07 | Discharge: 2012-11-07 | Disposition: A | Discharge status: Home / Self Care | Payer: 59 | Source: Ambulatory Visit | Attending: Radiation Oncology | Admitting: Radiation Oncology

## 2012-11-08 ENCOUNTER — Encounter: Payer: Self-pay | Admitting: Radiation Oncology

## 2012-11-08 ENCOUNTER — Ambulatory Visit
Admission: RE | Admit: 2012-11-08 | Discharge: 2012-11-08 | Disposition: A | Discharge status: Home / Self Care | Payer: 59 | Source: Ambulatory Visit | Attending: Radiation Oncology | Admitting: Radiation Oncology

## 2012-11-14 ENCOUNTER — Other Ambulatory Visit: Payer: Self-pay | Admitting: Radiation Oncology

## 2012-11-14 ENCOUNTER — Telehealth: Payer: Self-pay | Admitting: *Deleted

## 2012-11-14 ENCOUNTER — Telehealth: Payer: Self-pay | Admitting: Genetic Counselor

## 2012-11-14 ENCOUNTER — Encounter: Payer: Self-pay | Admitting: Radiation Oncology

## 2012-11-14 DIAGNOSIS — C189 Malignant neoplasm of colon, unspecified: Secondary | ICD-10-CM

## 2012-11-14 MED ORDER — OXYCODONE-ACETAMINOPHEN 5-325 MG PO TABS: 2.0000 | ORAL_TABLET | ORAL | Status: DC | PRN

## 2012-11-14 MED ORDER — ONDANSETRON HCL 8 MG PO TABS: 8.0000 mg | ORAL_TABLET | Freq: Three times a day (TID) | ORAL | Status: DC | PRN

## 2012-11-14 NOTE — Telephone Encounter (Signed)
Oxycodone refilled per Dr Valere Dross; script in nursing, and pt notified and informed person picking up script must have photo ID. Dr Lisbeth Renshaw to refill Zofran per e-script, and pt aware of this. Pt verbalized understanding.

## 2012-11-14 NOTE — Telephone Encounter (Signed)
Should contact Dr. Lisbeth Renshaw in [place] today.

## 2012-11-14 NOTE — Telephone Encounter (Signed)
Left good news message on machine.

## 2012-11-14 NOTE — Telephone Encounter (Signed)
Received call from pt requesting refill on Oxycodone-APAP and Zofran. Pt completed treatment for colorectal cancer on 11/07/12. She states she has been taking Oxycodone1-2 tabs every 2-4 hrs. She states she was "told she could take 2 tabs at a time". Pt states she is out of Oxycodone. Pt states she has nausea daily, is taking Zofran regularly every 4 hours. Informed her that her prescription states Zofran1 tab every 8 hours. She states her nausea is under control if she takes Zofran regularly. She states she will begin taking Zofran as written. Pt requests Zofran refill [facility] to [facility]. Pt states she is having diarrhea 4 x daily but "is controlling it w/Lomotil". Informed her Dr Lisbeth Renshaw is out of office, will route her request to on call dr and call her back. Pt aware Oxycodone script must be picked up in this office.

## 2012-11-14 NOTE — Progress Notes (Signed)
Oxycodone/APAP (5/325) prescription renewed to take 2 by mouth every 4 hours when necessary pain, dispense #60. Dr. Lisbeth Renshaw will renew her Zofran.

## 2012-11-15 ENCOUNTER — Telehealth: Payer: Self-pay | Admitting: Dietician

## 2012-11-16 ENCOUNTER — Other Ambulatory Visit: Payer: Self-pay | Admitting: Oncology

## 2012-11-16 DIAGNOSIS — C189 Malignant neoplasm of colon, unspecified: Secondary | ICD-10-CM

## 2012-11-17 NOTE — Progress Notes (Signed)
  Radiation Oncology         [phone] ________________________________  Name: Sheri Calderon MRN: [id number]  Date: 11/08/2012  DOB: [birth year]  End of Treatment Note  Diagnosis:   Rectal cancer     Indication for treatment:  Curative       Radiation treatment dates:   09/26/2012 through 11/08/2012  Site/dose:   The patient was treated initially to the pelvis including the rectal tumor and at risk lymph node regions. This was carried out using a 4 field 3-D conformal technique. The patient then received a boost using a cone down 4 field technique. The patient initially received 45 gray, in a 5.4 gray boost. The total dose was 50.4 gray.  Narrative: The patient tolerated radiation treatment relatively well.   The patient experienced some rectal irritation especially towards the end of treatment.  Plan: The patient has completed radiation treatment. The patient will return to radiation oncology clinic for routine followup in one month. I advised the patient to call or return sooner if they have any questions or concerns related to their recovery or treatment. ________________________________  Jodelle Gross, M.D., Ph.D.

## 2012-11-22 ENCOUNTER — Other Ambulatory Visit: Payer: Self-pay | Admitting: Radiation Oncology

## 2012-11-22 ENCOUNTER — Telehealth: Payer: Self-pay | Admitting: *Deleted

## 2012-11-22 ENCOUNTER — Telehealth: Payer: Self-pay | Admitting: Genetic Counselor

## 2012-11-22 NOTE — Telephone Encounter (Signed)
Called patient home [phone] left voice message to call back and clarify need where to send Lidocaine RX, got e-mail from [facility], "stated patient unknown to prescriber," saw last rx 11/03/12  [facility] [phone], MD out of office today ,please call back to clarify her needs 1:47 PM

## 2012-11-22 NOTE — Telephone Encounter (Signed)
Left good news message on machine.  Asked that she call me back.

## 2012-11-22 NOTE — Telephone Encounter (Signed)
Please forward refill request to Dr. Lisbeth Renshaw.

## 2012-11-23 ENCOUNTER — Other Ambulatory Visit: Payer: Self-pay | Admitting: *Deleted

## 2012-11-23 ENCOUNTER — Telehealth: Payer: Self-pay | Admitting: *Deleted

## 2012-11-23 DIAGNOSIS — C189 Malignant neoplasm of colon, unspecified: Secondary | ICD-10-CM

## 2012-11-23 MED ORDER — LIDOCAINE-PRILOCAINE 2.5-2.5 % EX CREA: TOPICAL_CREAM | CUTANEOUS | Status: DC | PRN

## 2012-11-23 NOTE — Telephone Encounter (Signed)
Patient called requesting refills on   rx of percocet 5/3101m , and 2% lidocain 3x day prn pain, will inform Dr,.Moody and will have her at nursing to pick up rx's tomorrow ,patient  Sheri Pitchershe is still having a lot of pain 3:43 PM

## 2012-11-24 ENCOUNTER — Encounter: Payer: Self-pay | Admitting: Oncology

## 2012-11-24 ENCOUNTER — Telehealth: Payer: Self-pay | Admitting: *Deleted

## 2012-11-24 ENCOUNTER — Other Ambulatory Visit (HOSPITAL_BASED_OUTPATIENT_CLINIC_OR_DEPARTMENT_OTHER): Payer: 59 | Admitting: Lab

## 2012-11-24 ENCOUNTER — Other Ambulatory Visit: Payer: Self-pay | Admitting: Radiation Oncology

## 2012-11-24 ENCOUNTER — Telehealth: Payer: Self-pay | Admitting: Oncology

## 2012-11-24 ENCOUNTER — Ambulatory Visit (HOSPITAL_BASED_OUTPATIENT_CLINIC_OR_DEPARTMENT_OTHER): Payer: 59

## 2012-11-24 ENCOUNTER — Other Ambulatory Visit: Payer: Self-pay | Admitting: Oncology

## 2012-11-24 ENCOUNTER — Encounter: Payer: Self-pay | Admitting: *Deleted

## 2012-11-24 ENCOUNTER — Ambulatory Visit (HOSPITAL_BASED_OUTPATIENT_CLINIC_OR_DEPARTMENT_OTHER): Payer: 59 | Admitting: Oncology

## 2012-11-24 VITALS — BP 145/92 | HR 83 | Temp 97.0°F | Resp 18 | Ht 67.0 in | Wt 178.0 lb

## 2012-11-24 DIAGNOSIS — C187 Malignant neoplasm of sigmoid colon: Secondary | ICD-10-CM

## 2012-11-24 DIAGNOSIS — F329 Major depressive disorder, single episode, unspecified: Secondary | ICD-10-CM

## 2012-11-24 DIAGNOSIS — Z5111 Encounter for antineoplastic chemotherapy: Secondary | ICD-10-CM

## 2012-11-24 DIAGNOSIS — C189 Malignant neoplasm of colon, unspecified: Secondary | ICD-10-CM

## 2012-11-24 LAB — CBC WITH DIFFERENTIAL/PLATELET
BASO%: 1.2 % (ref 0.0–2.0)
Eosinophils Absolute: 0.5 10*3/uL (ref 0.0–0.5)
HCT: 38.9 % (ref 34.8–46.6)
MCHC: 34.2 g/dL (ref 31.5–36.0)
MONO#: 0.5 10*3/uL (ref 0.1–0.9)
NEUT#: 3.8 10*3/uL (ref 1.5–6.5)
NEUT%: 70.1 % (ref 38.4–76.8)
WBC: 5.4 10*3/uL (ref 3.9–10.3)
lymph#: 0.6 10*3/uL — ABNORMAL LOW (ref 0.9–3.3)

## 2012-11-24 LAB — COMPREHENSIVE METABOLIC PANEL (CC13)
ALT: 27 U/L (ref 0–55)
CO2: 26 mEq/L (ref 22–29)
Calcium: 9.5 mg/dL (ref 8.4–10.4)
Chloride: 107 mEq/L (ref 98–109)
Creatinine: 0.8 mg/dL (ref 0.6–1.1)
Sodium: 143 mEq/L (ref 136–145)
Total Protein: 7.3 g/dL (ref 6.4–8.3)

## 2012-11-24 MED ORDER — SODIUM CHLORIDE 0.9 % IV SOLN
2400.0000 mg/m2 | INTRAVENOUS | Status: DC
  Administered 2012-11-24: 4650 mg via INTRAVENOUS
  Filled 2012-11-24: qty 93

## 2012-11-24 MED ORDER — FLUOROURACIL CHEMO INJECTION 2.5 GM/50ML
400.0000 mg/m2 | Freq: Once | INTRAVENOUS | Status: AC
  Administered 2012-11-24: 750 mg via INTRAVENOUS
  Filled 2012-11-24: qty 15

## 2012-11-24 MED ORDER — HEPARIN SOD (PORK) LOCK FLUSH 100 UNIT/ML IV SOLN
500.0000 [IU] | Freq: Once | INTRAVENOUS | Status: DC | PRN
  Filled 2012-11-24: qty 5

## 2012-11-24 MED ORDER — DEXTROSE 5 % IV SOLN
Freq: Once | INTRAVENOUS | Status: AC
  Administered 2012-11-24: 10:00:00 via INTRAVENOUS

## 2012-11-24 MED ORDER — PROMETHAZINE HCL 25 MG/ML IJ SOLN
12.5000 mg | Freq: Once | INTRAMUSCULAR | Status: AC
  Administered 2012-11-24: 12.5 mg via INTRAVENOUS
  Filled 2012-11-24: qty 1

## 2012-11-24 MED ORDER — OXALIPLATIN CHEMO INJECTION 100 MG/20ML
85.0000 mg/m2 | Freq: Once | INTRAVENOUS | Status: AC
  Administered 2012-11-24: 165 mg via INTRAVENOUS
  Filled 2012-11-24: qty 33

## 2012-11-24 MED ORDER — LEUCOVORIN CALCIUM INJECTION 350 MG
750.0000 mg | Freq: Once | INTRAVENOUS | Status: AC
  Administered 2012-11-24: 750 mg via INTRAVENOUS
  Filled 2012-11-24: qty 37.5

## 2012-11-24 MED ORDER — SODIUM CHLORIDE 0.9 % IV SOLN
Freq: Once | INTRAVENOUS | Status: AC
  Administered 2012-11-24: 10:00:00 via INTRAVENOUS

## 2012-11-24 MED ORDER — PALONOSETRON HCL INJECTION 0.25 MG/5ML
0.2500 mg | Freq: Once | INTRAVENOUS | Status: AC
  Administered 2012-11-24: 0.25 mg via INTRAVENOUS

## 2012-11-24 MED ORDER — SODIUM CHLORIDE 0.9 % IJ SOLN
10.0000 mL | INTRAMUSCULAR | Status: DC | PRN
  Filled 2012-11-24: qty 10

## 2012-11-24 MED ORDER — OXYCODONE-ACETAMINOPHEN 5-325 MG PO TABS: 2.0000 | ORAL_TABLET | ORAL | Status: DC | PRN

## 2012-11-24 MED ORDER — DEXAMETHASONE SODIUM PHOSPHATE 10 MG/ML IJ SOLN
INTRAMUSCULAR | Status: AC
  Filled 2012-11-24: qty 1

## 2012-11-24 MED ORDER — VENLAFAXINE HCL ER 37.5 MG PO CP24: 37.5000 mg | ORAL_CAPSULE | Freq: Every day | ORAL | Status: DC

## 2012-11-24 MED ORDER — LIDOCAINE VISCOUS 2 % MT SOLN: OROMUCOSAL | Status: DC

## 2012-11-24 MED ORDER — PALONOSETRON HCL INJECTION 0.25 MG/5ML
INTRAVENOUS | Status: AC
  Filled 2012-11-24: qty 5

## 2012-11-24 MED ORDER — DEXAMETHASONE SODIUM PHOSPHATE 10 MG/ML IJ SOLN
10.0000 mg | Freq: Once | INTRAMUSCULAR | Status: AC
  Administered 2012-11-24: 10 mg via INTRAVENOUS

## 2012-11-24 NOTE — Progress Notes (Signed)
   Sheri Calderon    OFFICE PROGRESS NOTE   INTERVAL HISTORY:   She Xeloda 11/08/2012. No nausea, mouth sores, or diarrhea. The abdominal pain has improved. She complains of pain at the gluteus related to radiation skin changes. She also has pain with bowel movements. No hand or foot pain.  [name] has developed amenorrhea since beginning treatment. She complains of hot flashes and depression symptoms. Lexapro did not help the depression. She would like to try a different antidepressant.  Objective:  Vital signs in last 24 hours:  Blood pressure 145/92, pulse 83, temperature 97 F (36.1 C), temperature source Oral, resp. rate 18, height 5' 7"  (1.702 m), weight 178 lb (80.74 kg), SpO2 100.00%.    HEENT: No thrush or ulcers Resp: Lungs clear bilaterally Cardio: Regular rate and rhythm GI: No hepatomegaly, no mass, nontender Vascular: No leg edema  Skin: Palms without erythema, radiation hyperpigmentation at the upper gluteus/gluteal fold without skin breakdown. Small external hemorrhoids noted at the anal verge without ulceration   Portacath/PICC-without erythema  Lab Results:  Lab Results  Component Value Date   WBC 5.4 11/24/2012   HGB 13.3 11/24/2012   HCT 38.9 11/24/2012   MCV 90.2 11/24/2012   PLT 300 11/24/2012   ANC 3.8    Medications: I have reviewed the patient's current medications.  Assessment/Plan: 1. 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 The tumor is associated with a bowel perforation. Tumor focally involved a radial surgical margin. She completed cycle 1 adjuvant FOLFOX 07/28/2012; cycle 2 08/11/2012; cycle 3 08/25/2012; cycle 4 09/08/2012. She began Xeloda/radiation 09/26/2012, completed 11/08/2012 2. Bowel perforation/diverticulitis secondary to #1. 3. History of atrial fibrillation. 4. Family history of cancer. The sigmoid colon  tumor 06/28/2012 was microsatellite stable and without loss of mismatch repair proteins. 5. Status post Port-A-Cath placement 07/11/2012. 6. Nausea and vomiting following cycle 1 FOLFOX. Aloxi was added with cycle 2. 7. Depression.  8. "Muscle aches" following chemotherapy. ? manifestation of oxaliplatin neuropathy. She takes hydrocodone as needed. 9. Abdominal pain-this is most likely not related to chemotherapy. The pain is better. 10. 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 She denies neuropathy symptoms today. 11. "Hot flashes "-likely menopause symptoms    Disposition:  She has completed concurrent Xeloda and radiation. The plan is to resume adjuvant FOLFOX today. The plan is to complete 4 additional cycles of FOLFOX. She will return for an office visit and chemotherapy in 2 weeks.  We added effexor as an antidepressant and for menopause symptoms.   Betsy Coder, MD  11/24/2012  8:39 AM

## 2012-11-24 NOTE — Telephone Encounter (Signed)
Left message  On phone [phone] for Dr.Sherrill's nurse pain rx's are here in radiation oncology for patient to be picked up in nursing  after seeing Dr.Sherrill today 8:49 AM

## 2012-11-24 NOTE — Progress Notes (Signed)
Met with patient to assess for needs.  She stated she feels much better now since she completed XRT.  She continues to have hot flashes.  She is happy to be starting different medication today for depression.  She stated she is feeling better now since she is" half-way through" treatment.  She denies any needs at present time.  Will continue to follow as needed.

## 2012-11-24 NOTE — Progress Notes (Signed)
Put disability form on nurse's desk.

## 2012-11-24 NOTE — Telephone Encounter (Signed)
Per staff phone call and POF I have schedueld appts.  [name]  

## 2012-11-24 NOTE — Patient Instructions (Addendum)
Venlafaxine extended-release capsules What is this medicine? VENLAFAXINE (VEN la fax een) is used to treat depression, anxiety and panic disorder/also used to help minimize hot flashes This medicine may be used for other purposes; ask your health care provider or pharmacist if you have questions. What should I tell my health care provider before I take this medicine? They need to know if you have any of these conditions: -anorexia or weight loss -glaucoma -high blood pressure, heart problems or a recent heart attack -high cholesterol levels or receiving treatment for high cholesterol -kidney or liver disease -mania or bipolar disorder -seizures (convulsions) -suicidal thoughts or a previous suicide attempt -thyroid problems -an unusual or allergic reaction to venlafaxine, other medicines, foods, dyes, or preservatives -pregnant or trying to get pregnant -breast-feeding How should I use this medicine? Take this medicine by mouth with a full glass of water. Follow the directions on the prescription label. Do not cut, crush or chew this medicine. Take it with food. Try to take your medicine at about the same time each day. Do not take your medicine more often than directed. Do not stop taking this medicine suddenly except upon the advice of your doctor. Stopping this medicine too quickly may cause serious side effects or your condition may worsen. A special MedGuide will be given to you by the pharmacist with each prescription and refill. Be sure to read this information carefully each time. Talk to your pediatrician regarding the use of this medicine in children. Special care may be needed. Overdosage: If you think you have taken too much of this medicine contact a poison control center or emergency room at once. NOTE: This medicine is only for you. Do not share this medicine with others. What if I miss a dose? If you miss a dose, take it as soon as you can. If it is almost time for your next  dose, take only that dose. Do not take double or extra doses. What may interact with this medicine? Do not take this medicine with any of the following medications: -desvenlafaxine -duloxetine -linezolid -MAOIs like Azilect, Carbex, Eldepryl, Marplan, Nardil, and Parnate -medicines for weight control or appetite -methylene blue (injected into a vein) -nefazodone -procarbazine -tryptophan This medicine may also interact with the following medications: -amphetamine or dextroamphetamine -aspirin and aspirin-like medicines -cimetidine -clozapine -medicines for heart rhythm or blood pressure -medicines for migraine headache like almotriptan, eletriptan, frovatriptan, naratriptan, rizatriptan, sumatriptan, zolmitriptan -medicines that treat or prevent blood clots like warfarin, enoxaparin, and dalteparin -NSAIDS, medicines for pain and inflammation, like ibuprofen or naproxen -other medicines for depression, anxiety, or psychotic disturbances -ritonavir -St. John's wort -tramadol This list may not describe all possible interactions. Give your health care provider a list of all the medicines, herbs, non-prescription drugs, or dietary supplements you use. Also tell them if you smoke, drink alcohol, or use illegal drugs. Some items may interact with your medicine. What should I watch for while using this medicine? Tell your doctor if your symptoms do not get better or if they get worse. Visit your doctor or health care professional for regular checks on your progress. Because it may take several weeks to see the full effects of this medicine, it is important to continue your treatment as prescribed by your doctor. Patients and their families should watch out for new or worsening thoughts of suicide or depression. Also watch out for sudden changes in feelings such as feeling anxious, agitated, panicky, irritable, hostile, aggressive, impulsive, severely restless, overly excited and  hyperactive, or  not being able to sleep. If this happens, especially at the beginning of treatment or after a change in dose, call your health care professional. This medicine can cause an increase in blood pressure. Check with your doctor for instructions on monitoring your blood pressure while taking this medicine. You may get drowsy or dizzy. Do not drive, use machinery, or do anything that needs mental alertness until you know how this medicine affects you. Do not stand or sit up quickly, especially if you are an older patient. This reduces the risk of dizzy or fainting spells. Alcohol may interfere with the effect of this medicine. Avoid alcoholic drinks. Your mouth may get dry. Chewing sugarless gum, sucking hard candy and drinking plenty of water will help. Contact your doctor if the problem does not go away or is severe. What side effects may I notice from receiving this medicine? Side effects that you should report to your doctor or health care professional as soon as possible: -allergic reactions like skin rash, itching or hives, swelling of the face, lips, or tongue -breathing problems -changes in vision -hallucination, loss of contact with reality -seizures -suicidal thoughts or other mood changes -trouble passing urine or change in the amount of urine -unusual bleeding or bruising Side effects that usually do not require medical attention (report to your doctor or health care professional if they continue or are bothersome): -change in sex drive or performance -constipation -increased sweating -loss of appetite -nausea -tremors -weight loss This list may not describe all possible side effects. Call your doctor for medical advice about side effects. You may report side effects to FDA at 1-800-FDA-1088. Where should I keep my medicine? Keep out of the reach of children. Store at a controlled temperature between 20 and 25 degrees C (68 degrees and 77 degrees F), in a dry place. Throw away any  unused medicine after the expiration date. NOTE: This sheet is a summary. It may not cover all possible information. If you have questions about this medicine, talk to your doctor, pharmacist, or health care provider.  2013, [employer]. (07/03/2011 9:05:04 PM) [facility] Discharge Instructions for Patients Receiving Chemotherapy  Today you received the following chemotherapy agents oxaliplatin, 5FU, leucovorin  To help prevent nausea and vomiting after your treatment, we encourage you to take your nausea medication as needed   If you develop nausea and vomiting that is not controlled by your nausea medication, call the clinic.   BELOW ARE SYMPTOMS THAT SHOULD BE REPORTED IMMEDIATELY:  *FEVER GREATER THAN 100.5 F  *CHILLS WITH OR WITHOUT FEVER  NAUSEA AND VOMITING THAT IS NOT CONTROLLED WITH YOUR NAUSEA MEDICATION  *UNUSUAL SHORTNESS OF BREATH  *UNUSUAL BRUISING OR BLEEDING  TENDERNESS IN MOUTH AND THROAT WITH OR WITHOUT PRESENCE OF ULCERS  *URINARY PROBLEMS  *BOWEL PROBLEMS  UNUSUAL RASH Items with * indicate a potential emergency and should be followed up as soon as possible.  Feel free to call the clinic you have any questions or concerns. The clinic phone number is [phone].   Foods Rich in Potassium Food / Potassium (mg)  Apricots, dried,  cup / 378 mg   Apricots, raw, 1 cup halves / 401 mg   Avocado,  / 487 mg   Banana, 1 large / 487 mg   Beef, lean, round, 3 oz / 202 mg   Cantaloupe, 1 cup cubes / 427 mg   Dates, medjool, 5 whole / 835 mg   Ham, cured,  3 oz / 212 mg   Lentils, dried,  cup / 458 mg   Lima beans, frozen,  cup / 258 mg   Orange, 1 large / 333 mg   Orange juice, 1 cup / 443 mg   Peaches, dried,  cup / 398 mg   Peas, split, cooked,  cup / 355 mg   Potato, boiled, 1 medium / 515 mg   Prunes, dried, uncooked,  cup / 318 mg   Raisins,  cup / 309 mg   Salmon, pink, raw, 3 oz / 275 mg    Sardines, canned , 3 oz / 338 mg   Tomato, raw, 1 medium / 292 mg   Tomato juice, 6 oz / 417 mg   [place], 3 oz / 349 mg  Document Released: 02/16/2005 Document Revised: 10/29/2010 Document Reviewed: 07/02/2008 [facility] Patient Information 2012 [place].

## 2012-11-24 NOTE — Telephone Encounter (Signed)
gv and printeda ppt sched adn avs forpt for OCT...MW added tx.

## 2012-11-25 ENCOUNTER — Encounter: Payer: Self-pay | Admitting: Oncology

## 2012-11-25 NOTE — Progress Notes (Signed)
Put disability paper in registration desk.

## 2012-11-26 ENCOUNTER — Ambulatory Visit (HOSPITAL_BASED_OUTPATIENT_CLINIC_OR_DEPARTMENT_OTHER): Payer: 59

## 2012-11-26 VITALS — BP 148/86 | HR 65 | Temp 98.1°F | Resp 18

## 2012-11-26 DIAGNOSIS — C187 Malignant neoplasm of sigmoid colon: Secondary | ICD-10-CM

## 2012-11-26 DIAGNOSIS — Z452 Encounter for adjustment and management of vascular access device: Secondary | ICD-10-CM

## 2012-11-26 MED ORDER — HEPARIN SOD (PORK) LOCK FLUSH 100 UNIT/ML IV SOLN
500.0000 [IU] | Freq: Once | INTRAVENOUS | Status: AC | PRN
  Administered 2012-11-26: 500 [IU]
  Filled 2012-11-26: qty 5

## 2012-11-26 MED ORDER — SODIUM CHLORIDE 0.9 % IJ SOLN
10.0000 mL | INTRAMUSCULAR | Status: DC | PRN
  Administered 2012-11-26: 10 mL
  Filled 2012-11-26: qty 10

## 2012-12-01 ENCOUNTER — Telehealth: Payer: Self-pay | Admitting: Genetic Counselor

## 2012-12-01 ENCOUNTER — Encounter: Payer: Self-pay | Admitting: Genetic Counselor

## 2012-12-01 NOTE — Telephone Encounter (Signed)
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  Her letter with her results will be on their way to her.

## 2012-12-01 NOTE — Telephone Encounter (Signed)
Left good news message.  Related that I have tried several times to reach her but have been unsuccessful.  Therefore I am going to send a letter with a copy of her test results to her.  If she receives this message she can call me back and I will relay the results to her.  Otherwise she can wait until she recieves her letter and results and call me if she has any questions.  It may take several weeks to get the letter.  Left phone number.

## 2012-12-04 ENCOUNTER — Other Ambulatory Visit: Payer: Self-pay | Admitting: Oncology

## 2012-12-05 ENCOUNTER — Other Ambulatory Visit: Payer: Self-pay | Admitting: *Deleted

## 2012-12-05 MED ORDER — OXYCODONE-ACETAMINOPHEN 5-325 MG PO TABS: 1.0000 | ORAL_TABLET | ORAL | Status: DC | PRN

## 2012-12-05 NOTE — Telephone Encounter (Signed)
Call from pt requesting refill on Oxycodone. Order received from Dr. Benay Spice, Rx left in prescription book for pick up.

## 2012-12-08 ENCOUNTER — Other Ambulatory Visit: Payer: Self-pay | Admitting: Oncology

## 2012-12-08 ENCOUNTER — Other Ambulatory Visit (HOSPITAL_BASED_OUTPATIENT_CLINIC_OR_DEPARTMENT_OTHER): Payer: 59 | Admitting: Lab

## 2012-12-08 ENCOUNTER — Telehealth: Payer: Self-pay | Admitting: Oncology

## 2012-12-08 ENCOUNTER — Ambulatory Visit (HOSPITAL_BASED_OUTPATIENT_CLINIC_OR_DEPARTMENT_OTHER): Payer: 59 | Admitting: Oncology

## 2012-12-08 ENCOUNTER — Ambulatory Visit (HOSPITAL_BASED_OUTPATIENT_CLINIC_OR_DEPARTMENT_OTHER): Payer: 59

## 2012-12-08 ENCOUNTER — Telehealth: Payer: Self-pay | Admitting: *Deleted

## 2012-12-08 VITALS — BP 153/95 | HR 67 | Temp 97.5°F

## 2012-12-08 VITALS — BP 172/97 | HR 65 | Temp 97.6°F | Resp 20 | Ht 67.0 in | Wt 180.5 lb

## 2012-12-08 DIAGNOSIS — C189 Malignant neoplasm of colon, unspecified: Secondary | ICD-10-CM

## 2012-12-08 DIAGNOSIS — C187 Malignant neoplasm of sigmoid colon: Secondary | ICD-10-CM

## 2012-12-08 DIAGNOSIS — Z5111 Encounter for antineoplastic chemotherapy: Secondary | ICD-10-CM

## 2012-12-08 DIAGNOSIS — F329 Major depressive disorder, single episode, unspecified: Secondary | ICD-10-CM

## 2012-12-08 LAB — CBC WITH DIFFERENTIAL/PLATELET
BASO%: 1.4 % (ref 0.0–2.0)
Basophils Absolute: 0 10*3/uL (ref 0.0–0.1)
EOS%: 12.4 % — ABNORMAL HIGH (ref 0.0–7.0)
Eosinophils Absolute: 0.4 10*3/uL (ref 0.0–0.5)
HCT: 33.7 % — ABNORMAL LOW (ref 34.8–46.6)
HGB: 11.7 g/dL (ref 11.6–15.9)
LYMPH%: 19.1 % (ref 14.0–49.7)
MCH: 31.3 pg (ref 25.1–34.0)
MCHC: 34.8 g/dL (ref 31.5–36.0)
MCV: 90 fL (ref 79.5–101.0)
MONO#: 0.4 10*3/uL (ref 0.1–0.9)
MONO%: 12.3 % (ref 0.0–14.0)
NEUT#: 1.6 10*3/uL (ref 1.5–6.5)
NEUT%: 54.8 % (ref 38.4–76.8)
Platelets: 203 10*3/uL (ref 145–400)
RBC: 3.74 10*6/uL (ref 3.70–5.45)
RDW: 16.6 % — ABNORMAL HIGH (ref 11.2–14.5)
WBC: 3 10*3/uL — ABNORMAL LOW (ref 3.9–10.3)
lymph#: 0.6 10*3/uL — ABNORMAL LOW (ref 0.9–3.3)

## 2012-12-08 LAB — COMPREHENSIVE METABOLIC PANEL (CC13)
ALT: 21 U/L (ref 0–55)
AST: 20 U/L (ref 5–34)
Albumin: 3.2 g/dL — ABNORMAL LOW (ref 3.5–5.0)
Alkaline Phosphatase: 63 U/L (ref 40–150)
Anion Gap: 10 meq/L (ref 3–11)
BUN: 13.9 mg/dL (ref 7.0–26.0)
CO2: 29 meq/L (ref 22–29)
Calcium: 8.8 mg/dL (ref 8.4–10.4)
Chloride: 104 meq/L (ref 98–109)
Creatinine: 0.8 mg/dL (ref 0.6–1.1)
Glucose: 92 mg/dL (ref 70–140)
Potassium: 3.7 meq/L (ref 3.5–5.1)
Sodium: 143 meq/L (ref 136–145)
Total Bilirubin: 0.29 mg/dL (ref 0.20–1.20)
Total Protein: 6.5 g/dL (ref 6.4–8.3)

## 2012-12-08 MED ORDER — SODIUM CHLORIDE 0.9 % IV SOLN
2400.0000 mg/m2 | INTRAVENOUS | Status: DC
  Administered 2012-12-08: 4650 mg via INTRAVENOUS
  Filled 2012-12-08: qty 93

## 2012-12-08 MED ORDER — DIPHENHYDRAMINE HCL 50 MG/ML IJ SOLN
25.0000 mg | Freq: Once | INTRAMUSCULAR | Status: AC
  Administered 2012-12-08: 25 mg via INTRAVENOUS

## 2012-12-08 MED ORDER — FLUOROURACIL CHEMO INJECTION 2.5 GM/50ML
400.0000 mg/m2 | Freq: Once | INTRAVENOUS | Status: AC
  Administered 2012-12-08: 750 mg via INTRAVENOUS
  Filled 2012-12-08: qty 15

## 2012-12-08 MED ORDER — DIPHENHYDRAMINE HCL 50 MG/ML IJ SOLN
INTRAMUSCULAR | Status: AC
  Filled 2012-12-08: qty 1

## 2012-12-08 MED ORDER — PALONOSETRON HCL INJECTION 0.25 MG/5ML
INTRAVENOUS | Status: AC
  Filled 2012-12-08: qty 5

## 2012-12-08 MED ORDER — LEUCOVORIN CALCIUM INJECTION 350 MG
750.0000 mg | Freq: Once | INTRAVENOUS | Status: AC
  Administered 2012-12-08: 750 mg via INTRAVENOUS
  Filled 2012-12-08: qty 37.5

## 2012-12-08 MED ORDER — DEXAMETHASONE SODIUM PHOSPHATE 10 MG/ML IJ SOLN
INTRAMUSCULAR | Status: AC
  Filled 2012-12-08: qty 1

## 2012-12-08 MED ORDER — ALPRAZOLAM 0.5 MG PO TABS
ORAL_TABLET | ORAL | Status: AC
  Filled 2012-12-08: qty 1

## 2012-12-08 MED ORDER — OXALIPLATIN CHEMO INJECTION 100 MG/20ML
85.0000 mg/m2 | Freq: Once | INTRAVENOUS | Status: AC
  Administered 2012-12-08: 165 mg via INTRAVENOUS
  Filled 2012-12-08: qty 33

## 2012-12-08 MED ORDER — ALPRAZOLAM 0.5 MG PO TABS
0.5000 mg | ORAL_TABLET | Freq: Once | ORAL | Status: AC
  Administered 2012-12-08: 0.5 mg via ORAL

## 2012-12-08 MED ORDER — DEXAMETHASONE SODIUM PHOSPHATE 10 MG/ML IJ SOLN
10.0000 mg | Freq: Once | INTRAMUSCULAR | Status: AC
  Administered 2012-12-08: 10 mg via INTRAVENOUS

## 2012-12-08 MED ORDER — PALONOSETRON HCL INJECTION 0.25 MG/5ML
0.2500 mg | Freq: Once | INTRAVENOUS | Status: AC
  Administered 2012-12-08: 0.25 mg via INTRAVENOUS

## 2012-12-08 MED ORDER — DEXTROSE 5 % IV SOLN
Freq: Once | INTRAVENOUS | Status: AC
  Administered 2012-12-08: 11:00:00 via INTRAVENOUS

## 2012-12-08 NOTE — Telephone Encounter (Signed)
Per staff message and POF I have scheduled appts.  [name]  

## 2012-12-08 NOTE — Progress Notes (Signed)
   [place]    OFFICE PROGRESS NOTE   INTERVAL HISTORY:   She completed another cycle of FOLFOX on 11/24/2012. She had nausea beginning on day 3. The nausea was improved with Zofran and Decadron. She had mild diarrhea for 4-5 days following chemotherapy. The diarrhea improved with Lomotil. She has noted "tingling" in the hands. She complains of aching in the joints and muscles. The skin breakdown at the perineum has resolved.  Objective:  Vital signs in last 24 hours:  Blood pressure 172/97, pulse 65, temperature 97.6 F (36.4 C), temperature source Oral, resp. rate 20, height 5' 7"  (1.702 m), weight 180 lb 8 oz (81.874 kg).    HEENT: No thrush or ulcers Resp: Lungs clear bilaterally Cardio: Regular rate and rhythm GI: No hepatomegaly, no mass, nontender Vascular: No leg edema Neuro: Mild to moderate decrease in vibratory sense at the fingertips bilaterally  Skin: Palms without erythema   Portacath/PICC-without erythema  Lab Results:  Lab Results  Component Value Date   WBC 3.0* 12/08/2012   HGB 11.7 12/08/2012   HCT 33.7* 12/08/2012   MCV 90.0 12/08/2012   PLT 203 12/08/2012   ANC 1.6    Medications: I have reviewed the patient's current medications.  Assessment/Plan: 1. 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 The tumor is associated with a bowel perforation. Tumor focally involved a radial surgical margin. She completed cycle 1 adjuvant FOLFOX 07/28/2012; cycle 2 08/11/2012; cycle 3 08/25/2012; cycle 4 09/08/2012. She began Xeloda/radiation 09/26/2012, completed 11/08/2012, adjuvant FOLFOX resumed 11/24/2012 2. Bowel perforation/diverticulitis secondary to #1. 3. History of atrial fibrillation. 4. Family history of cancer. The sigmoid colon tumor 06/28/2012 was microsatellite stable and without loss of mismatch repair proteins. 5. Status  post Port-A-Cath placement 07/11/2012. 6. Nausea and vomiting following cycle 1 FOLFOX. Aloxi was added with cycle 2. Now taking prophylactic Decadron following chemotherapy 7. Depression.  8. "Muscle aches" following chemotherapy. ? manifestation of oxaliplatin neuropathy. She takes hydrocodone as needed. 9. Abdominal pain-this is most likely not related to chemotherapy. The pain is better. 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 Not interfering with activity at present 10. "Hot flashes "-likely menopause symptoms  Disposition:  She appears to be tolerating the chemotherapy well. She has developed early oxaliplatin neuropathy. She will contact us if she develops neuropathy symptoms. She will complete another cycle of FOLFOX today. Ms. Barrett will return for an office visit and chemotherapy in 2 weeks.   Betsy Coder, MD  12/08/2012  9:26 AM

## 2012-12-08 NOTE — Progress Notes (Signed)
1235 Patient c/o itching and redness to bilat hands 144/89, 68, temp 96.7. Dr Ammie Dalton notified. V.O for 25 mg IV of benadryl.  1235 Oxaliplatin placed on hold till further notice from Dr Benay Spice.  1254 Patient states feeling much better, hands feeling better, no redness or burning.  1255 V.O. To d/c Oxaliplatin and restart Leucovorin per Dr Benay Spice.  1340 Patient sleeping, leucovorin continues to infuse.  1425 Patient return from bathroom trip, c/o itching to hands and feet. Hands red and warm. Report to Ned Card NP. Patient to stay 30 minutes for observation, ok to proceed with Adrucil push and pump.  1305 Patient states itching to hands and feet are gone, no other symptoms. Reported to Dr Benay Spice patient's status and v/s as well as elevated B/P. Per Dr Benay Spice, patient ok for d/c and patient ok to take benadryl at home should she start with itching. Patient given instructions to avoid anything cold d/t having approx 60 minutes of Oxaliplatin infused. Patient verbalized understanding. Knows to call clinic with any questions or concerns. Patient d/c ambulating with spouse. AVS provided.

## 2012-12-08 NOTE — Patient Instructions (Addendum)
[place] Discharge Instructions for Patients Receiving Chemotherapy  Today you received the following chemotherapy agents Leucovorin and Adrucil  To help prevent nausea and vomiting after your treatment, we encourage you to take your nausea medication.   If you develop nausea and vomiting that is not controlled by your nausea medication, call the clinic.   BELOW ARE SYMPTOMS THAT SHOULD BE REPORTED IMMEDIATELY:  *FEVER GREATER THAN 100.5 F  *CHILLS WITH OR WITHOUT FEVER  NAUSEA AND VOMITING THAT IS NOT CONTROLLED WITH YOUR NAUSEA MEDICATION  *UNUSUAL SHORTNESS OF BREATH  *UNUSUAL BRUISING OR BLEEDING  TENDERNESS IN MOUTH AND THROAT WITH OR WITHOUT PRESENCE OF ULCERS  *URINARY PROBLEMS  *BOWEL PROBLEMS  UNUSUAL RASH Items with * indicate a potential emergency and should be followed up as soon as possible.  Feel free to call the clinic you have any questions or concerns. The clinic phone number is [phone].

## 2012-12-08 NOTE — Telephone Encounter (Signed)
gv and printed appt sched and avs for pt for OCT and NOV...MW sched tx.

## 2012-12-09 ENCOUNTER — Telehealth: Payer: Self-pay | Admitting: *Deleted

## 2012-12-09 MED ORDER — OXYCODONE-ACETAMINOPHEN 5-325 MG PO TABS: 1.0000 | ORAL_TABLET | ORAL | Status: DC | PRN

## 2012-12-09 NOTE — Telephone Encounter (Signed)
Message from pt requesting refill on Oxycodone. Having hip and joint pain. Reviewed with Lattie Haw, NP. OK for refill. Recommended pt add Ibuprofen to see if she gets better joint pain relief. Reports she had one episode of palmar/plantar itching and erythema at home. Took Benadryl and it resolved in 15 minutes.

## 2012-12-10 ENCOUNTER — Ambulatory Visit (HOSPITAL_BASED_OUTPATIENT_CLINIC_OR_DEPARTMENT_OTHER): Payer: 59

## 2012-12-10 VITALS — BP 148/93 | HR 77 | Temp 97.8°F | Resp 20

## 2012-12-10 DIAGNOSIS — Z452 Encounter for adjustment and management of vascular access device: Secondary | ICD-10-CM

## 2012-12-10 DIAGNOSIS — C187 Malignant neoplasm of sigmoid colon: Secondary | ICD-10-CM

## 2012-12-10 MED ORDER — HEPARIN SOD (PORK) LOCK FLUSH 100 UNIT/ML IV SOLN
500.0000 [IU] | Freq: Once | INTRAVENOUS | Status: AC | PRN
  Administered 2012-12-10: 500 [IU]
  Filled 2012-12-10: qty 5

## 2012-12-10 MED ORDER — SODIUM CHLORIDE 0.9 % IJ SOLN
10.0000 mL | INTRAMUSCULAR | Status: DC | PRN
  Administered 2012-12-10: 10 mL
  Filled 2012-12-10: qty 10

## 2012-12-14 ENCOUNTER — Other Ambulatory Visit: Payer: Self-pay | Admitting: Oncology

## 2012-12-19 ENCOUNTER — Telehealth: Payer: Self-pay | Admitting: *Deleted

## 2012-12-19 DIAGNOSIS — C189 Malignant neoplasm of colon, unspecified: Secondary | ICD-10-CM

## 2012-12-19 MED ORDER — OXYCODONE-ACETAMINOPHEN 5-325 MG PO TABS: 0.5000 | ORAL_TABLET | Freq: Two times a day (BID) | ORAL | Status: DC | PRN

## 2012-12-19 NOTE — Telephone Encounter (Signed)
Husband called asking for nurse to return his wife's calls for pain medication. Called husband back and made him aware that nurse has returned each call and got her voice mail. He reports she has a new phone and this could be the problem. He will call her to see if she will answer him. Nurse will attempt to call her again in a few minutes. Called back and spoke with Blanch Media: Pain is in both legs and left hip and is aching and cramping in nature. Has been taking ibuprofen every 4 hours in addition to percocet and pain at the time still rated 6/10. Out of percocet over weekend and now her pain is rated 10/10. Also needs refill on her antidepressant/hot flash medication.  Made her aware that the Effexor-XR still has #2 refills available (she was able to confirm this after looking on the bottle) and made her aware her promethazine still had a refill and is ready for pick up at [facility]. She can have her brother pick up her Percocet script for her (he lives in [place] and is on her release). Per Dr. Benay Spice, he only wants her to take 1/2 tablet twice daily + use the ibuprofen 400 mg every 4 hours prn for pain also. Will re-evaluate her pain at her visit on 12/22/12. Patient understands and agrees.

## 2012-12-19 NOTE — Telephone Encounter (Signed)
Left VM requesting pain medication refill. Attempted to return her call-had to leave message.

## 2012-12-19 NOTE — Addendum Note (Signed)
Addended by: Tania Ade on: 12/19/2012 04:54 PM   Modules accepted: Orders

## 2012-12-19 NOTE — Telephone Encounter (Addendum)
Patient left VM again at 1307 regarding pain medication and that her call has not been returned. This RN called back and left VM again at 1523 that we need to discuss her level of pain again prior to refill. Nurse has been returning her calls.

## 2012-12-20 ENCOUNTER — Other Ambulatory Visit: Payer: Self-pay | Admitting: Oncology

## 2012-12-21 ENCOUNTER — Other Ambulatory Visit (INDEPENDENT_AMBULATORY_CARE_PROVIDER_SITE_OTHER): Payer: Self-pay

## 2012-12-21 ENCOUNTER — Encounter: Payer: Self-pay | Admitting: Gastroenterology

## 2012-12-21 ENCOUNTER — Telehealth (INDEPENDENT_AMBULATORY_CARE_PROVIDER_SITE_OTHER): Payer: Self-pay

## 2012-12-21 ENCOUNTER — Telehealth (INDEPENDENT_AMBULATORY_CARE_PROVIDER_SITE_OTHER): Payer: Self-pay | Admitting: General Surgery

## 2012-12-21 ENCOUNTER — Other Ambulatory Visit (INDEPENDENT_AMBULATORY_CARE_PROVIDER_SITE_OTHER): Payer: Self-pay | Admitting: Surgery

## 2012-12-21 DIAGNOSIS — Z85038 Personal history of other malignant neoplasm of large intestine: Secondary | ICD-10-CM

## 2012-12-21 NOTE — Telephone Encounter (Signed)
She will need to see either [facility] or [facility] for GI consult.  History of colon cancer.

## 2012-12-21 NOTE — Telephone Encounter (Signed)
Patient calling into office to see if we can schedule her a Colonoscopy before the end of the year due to her insurance changes.  Please call patient to advise if she need's to have a colonoscopy scheduled.

## 2012-12-21 NOTE — Telephone Encounter (Signed)
Called patient back to see if she see a GI Doctor and she stated no that she did not have GI Dr, I told her that Dr Ninfa Linden is here this afternoon and I will talk to him to see if he wants her to have one and I will call her back either way. She stated that she has one or two more treatment with Dr Benay Spice

## 2012-12-22 ENCOUNTER — Telehealth: Payer: Self-pay | Admitting: *Deleted

## 2012-12-22 ENCOUNTER — Ambulatory Visit (HOSPITAL_BASED_OUTPATIENT_CLINIC_OR_DEPARTMENT_OTHER): Payer: 59 | Admitting: Nurse Practitioner

## 2012-12-22 ENCOUNTER — Ambulatory Visit (HOSPITAL_BASED_OUTPATIENT_CLINIC_OR_DEPARTMENT_OTHER): Payer: 59

## 2012-12-22 ENCOUNTER — Other Ambulatory Visit (HOSPITAL_BASED_OUTPATIENT_CLINIC_OR_DEPARTMENT_OTHER): Payer: 59 | Admitting: Lab

## 2012-12-22 ENCOUNTER — Telehealth: Payer: Self-pay | Admitting: Oncology

## 2012-12-22 VITALS — BP 173/117 | HR 81 | Temp 96.8°F | Resp 18 | Ht 67.0 in | Wt 182.7 lb

## 2012-12-22 VITALS — BP 143/86 | HR 78 | Temp 98.1°F | Resp 20

## 2012-12-22 DIAGNOSIS — C187 Malignant neoplasm of sigmoid colon: Secondary | ICD-10-CM

## 2012-12-22 DIAGNOSIS — R232 Flushing: Secondary | ICD-10-CM

## 2012-12-22 DIAGNOSIS — C189 Malignant neoplasm of colon, unspecified: Secondary | ICD-10-CM

## 2012-12-22 DIAGNOSIS — Z5111 Encounter for antineoplastic chemotherapy: Secondary | ICD-10-CM

## 2012-12-22 DIAGNOSIS — IMO0001 Reserved for inherently not codable concepts without codable children: Secondary | ICD-10-CM

## 2012-12-22 DIAGNOSIS — M25559 Pain in unspecified hip: Secondary | ICD-10-CM

## 2012-12-22 DIAGNOSIS — R109 Unspecified abdominal pain: Secondary | ICD-10-CM

## 2012-12-22 DIAGNOSIS — F329 Major depressive disorder, single episode, unspecified: Secondary | ICD-10-CM

## 2012-12-22 LAB — COMPREHENSIVE METABOLIC PANEL (CC13)
ALT: 20 U/L (ref 0–55)
AST: 21 U/L (ref 5–34)
Albumin: 3.2 g/dL — ABNORMAL LOW (ref 3.5–5.0)
Alkaline Phosphatase: 70 U/L (ref 40–150)
Creatinine: 0.9 mg/dL (ref 0.6–1.1)
Sodium: 143 mEq/L (ref 136–145)
Total Bilirubin: 0.26 mg/dL (ref 0.20–1.20)
Total Protein: 6.8 g/dL (ref 6.4–8.3)

## 2012-12-22 LAB — CBC WITH DIFFERENTIAL/PLATELET
BASO%: 1.3 % (ref 0.0–2.0)
EOS%: 4.3 % (ref 0.0–7.0)
Eosinophils Absolute: 0.2 10*3/uL (ref 0.0–0.5)
HCT: 35 % (ref 34.8–46.6)
HGB: 12.1 g/dL (ref 11.6–15.9)
LYMPH%: 12.1 % — ABNORMAL LOW (ref 14.0–49.7)
MCH: 31.2 pg (ref 25.1–34.0)
MCHC: 34.5 g/dL (ref 31.5–36.0)
MCV: 90.5 fL (ref 79.5–101.0)
MONO#: 0.4 10*3/uL (ref 0.1–0.9)
MONO%: 11.2 % (ref 0.0–14.0)
NEUT%: 71.1 % (ref 38.4–76.8)
Platelets: 207 10*3/uL (ref 145–400)
RBC: 3.87 10*6/uL (ref 3.70–5.45)
WBC: 3.7 10*3/uL — ABNORMAL LOW (ref 3.9–10.3)

## 2012-12-22 MED ORDER — SODIUM CHLORIDE 0.9 % IV SOLN
2400.0000 mg/m2 | INTRAVENOUS | Status: DC
  Administered 2012-12-22: 4650 mg via INTRAVENOUS
  Filled 2012-12-22: qty 93

## 2012-12-22 MED ORDER — SODIUM CHLORIDE 0.9 % IV SOLN
Freq: Once | INTRAVENOUS | Status: AC
  Administered 2012-12-22: 11:00:00 via INTRAVENOUS

## 2012-12-22 MED ORDER — GABAPENTIN 300 MG PO CAPS: 300.0000 mg | ORAL_CAPSULE | Freq: Two times a day (BID) | ORAL | Status: DC

## 2012-12-22 MED ORDER — LEUCOVORIN CALCIUM INJECTION 350 MG
750.0000 mg | Freq: Once | INTRAVENOUS | Status: AC
  Administered 2012-12-22: 750 mg via INTRAVENOUS
  Filled 2012-12-22: qty 37.5

## 2012-12-22 MED ORDER — FLUOROURACIL CHEMO INJECTION 2.5 GM/50ML
400.0000 mg/m2 | Freq: Once | INTRAVENOUS | Status: AC
  Administered 2012-12-22: 750 mg via INTRAVENOUS
  Filled 2012-12-22: qty 15

## 2012-12-22 NOTE — Telephone Encounter (Signed)
Per staff message and POF I have scheduled appts.  [name]  

## 2012-12-22 NOTE — Patient Instructions (Signed)
[place] [facility] Discharge Instructions for Patients Receiving Chemotherapy  Today you received the following chemotherapy agents:  Leucovorin and 5FU.  To help prevent nausea and vomiting after your treatment, we encourage you to take your nausea medication as ordered per MD.   If you develop nausea and vomiting that is not controlled by your nausea medication, call the clinic.   BELOW ARE SYMPTOMS THAT SHOULD BE REPORTED IMMEDIATELY:  *FEVER GREATER THAN 100.5 F  *CHILLS WITH OR WITHOUT FEVER  NAUSEA AND VOMITING THAT IS NOT CONTROLLED WITH YOUR NAUSEA MEDICATION  *UNUSUAL SHORTNESS OF BREATH  *UNUSUAL BRUISING OR BLEEDING  TENDERNESS IN MOUTH AND THROAT WITH OR WITHOUT PRESENCE OF ULCERS  *URINARY PROBLEMS  *BOWEL PROBLEMS  UNUSUAL RASH Items with * indicate a potential emergency and should be followed up as soon as possible.  Feel free to call the clinic you have any questions or concerns. The clinic phone number is [phone].

## 2012-12-22 NOTE — Progress Notes (Addendum)
OFFICE PROGRESS NOTE  Interval history:  Sheri Calderon returns as scheduled. She completed cycle 6 FOLFOX on 12/08/2012. During the oxaliplatin infusion she developed erythema and pruritus over the palms. The oxaliplatin was discontinued. She was given Benadryl. She completed the remainder of the regimen.  She does not have nausea/vomiting as long as she takes scheduled Zofran. She developed mouth sores lasting about 4 days. Oral intake was diminished during the time she had mouth sores. She continues to have loose stools. The loose stools are controlled with Lomotil. Cold sensitivity lasted 3-4 days. No persistent neuropathy symptoms. She continues to have aching pain in the hip joints and leg muscles. She is taking one half of a Percocet tablet every 2 hours. She continues to have intermittent abdominal cramps. She has periodic hot flashes. She reports she has been referred to gastroenterology for a colonoscopy.   Objective: Blood pressure 173/117, pulse 81, temperature 96.8 F (36 C), temperature source Oral, resp. rate 18, height 5' 7"  (1.702 m), weight 182 lb 11.2 oz (82.872 kg).  Oropharynx is without thrush or ulceration. Lungs are clear. No wheezes or rales. Regular cardiac rhythm. Port-A-Cath site is without erythema. Abdomen is soft, nontender. No hepatomegaly. Extremities are without edema. Calves soft and nontender. Mild/moderate decrease in vibratory sense over the fingertips bilaterally per tuning fork exam.leg/foot strength is intact bilaterally  Lab Results: Lab Results  Component Value Date   WBC 3.7* 12/22/2012   HGB 12.1 12/22/2012   HCT 35.0 12/22/2012   MCV 90.5 12/22/2012   PLT 207 12/22/2012    Chemistry:    Chemistry      Component Value Date/Time   NA 143 12/22/2012 0856   NA 137 07/03/2012 0625   K 4.1 12/22/2012 0856   K 3.4* 07/03/2012 0625   CL 105 08/11/2012 0941   CL 102 07/03/2012 0625   CO2 27 12/22/2012 0856   CO2 29 07/03/2012 0625   BUN 12.7 12/22/2012 0856    BUN <3* 07/03/2012 0625   CREATININE 0.9 12/22/2012 0856   CREATININE 0.68 07/03/2012 0625      Component Value Date/Time   CALCIUM 9.1 12/22/2012 0856   CALCIUM 8.7 07/03/2012 0625   ALKPHOS 70 12/22/2012 0856   ALKPHOS 63 06/07/2012 1938   AST 21 12/22/2012 0856   AST 10 06/07/2012 1938   ALT 20 12/22/2012 0856   ALT 9 06/07/2012 1938   BILITOT 0.26 12/22/2012 0856   BILITOT 0.3 06/07/2012 1938       Studies/Results: No results found.  Medications: I have reviewed the patient's current medications.  Assessment/Plan:  1. 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 The tumor is associated with a bowel perforation. Tumor focally involved a radial surgical margin. She completed cycle 1 adjuvant FOLFOX 07/28/2012; cycle 2 08/11/2012; cycle 3 08/25/2012; cycle 4 09/08/2012. She began Xeloda/radiation 09/26/2012, completed 11/08/2012, adjuvant FOLFOX resumed 11/24/2012. 2. Bowel perforation/diverticulitis secondary to #1. 3. History of atrial fibrillation. 4. Family history of cancer. The sigmoid colon tumor 06/28/2012 was microsatellite stable and without loss of mismatch repair proteins. 5. Status post Port-A-Cath placement 07/11/2012. 6. Nausea and vomiting following cycle 1 FOLFOX. Aloxi was added with cycle 2. Now taking prophylactic Decadron following chemotherapy. 7. Depression.  8. "Muscle aches" following chemotherapy. ? manifestation of oxaliplatin neuropathy. She takes Percocet as needed. 9. Abdominal pain-this is most likely not related to chemotherapy.  10. 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 Not interfering with activity at present. 11. "Hot flashes". Question menopausal symptom. 12. Pruritus and erythema over the palms during the oxaliplatin infusion on 12/08/2012. Likely allergic reaction.  Disposition-she appears stable. During  the oxaliplatin infusion 2 weeks ago she developed pruritus and erythema over the palms. Symptoms were likely a manifestation of an allergic reaction. Dr. Benay Spice recommends discontinuation of further oxaliplatin. Plan to proceed with treatment today as scheduled without oxaliplatin.  She continues to have arthralgias and myalgias. Question oxaliplatin neuropathy. Dr. Benay Spice recommends a trial of Neurontin 300 mg twice daily.  She will return for a followup visit and chemotherapy in 2 weeks. She will contact the office in the interim with any problems.  30 minutes were spent face-to-face at today's visit with the majority of that time involved in counseling/coordination of care.  Patient seen with Dr. Letta Kocher, Sheri Calderon Sheri Calderon  This was a shared visit with Sheri Calderon.Ms. Fiola had an allergic reaction during the oxaliplatin infusion with the most recent cycle of chemotherapy.we decided to discontinue oxaliplatin from the chemotherapy regimen. She will continue 5-FU/leucovorin for 3 more cycles.  She will begin a trial of gabapentin for the atypical hip/leg pain.  Sheri Calderon, M.D.

## 2012-12-22 NOTE — Telephone Encounter (Signed)
gv and printed appt sched and avs for pt for OCT and NOV...[place] MW to add tx

## 2012-12-24 ENCOUNTER — Ambulatory Visit (HOSPITAL_BASED_OUTPATIENT_CLINIC_OR_DEPARTMENT_OTHER): Payer: 59

## 2012-12-24 VITALS — BP 141/94 | HR 75 | Temp 96.7°F

## 2012-12-24 DIAGNOSIS — Z452 Encounter for adjustment and management of vascular access device: Secondary | ICD-10-CM

## 2012-12-24 DIAGNOSIS — C189 Malignant neoplasm of colon, unspecified: Secondary | ICD-10-CM

## 2012-12-24 DIAGNOSIS — C187 Malignant neoplasm of sigmoid colon: Secondary | ICD-10-CM

## 2012-12-24 MED ORDER — HEPARIN SOD (PORK) LOCK FLUSH 100 UNIT/ML IV SOLN
500.0000 [IU] | Freq: Once | INTRAVENOUS | Status: AC | PRN
  Administered 2012-12-24: 500 [IU]
  Filled 2012-12-24: qty 5

## 2012-12-24 MED ORDER — SODIUM CHLORIDE 0.9 % IJ SOLN
10.0000 mL | INTRAMUSCULAR | Status: DC | PRN
  Administered 2012-12-24: 10 mL
  Filled 2012-12-24: qty 10

## 2012-12-24 NOTE — Patient Instructions (Signed)
Patient refused AVS.

## 2012-12-28 ENCOUNTER — Telehealth: Payer: Self-pay | Admitting: *Deleted

## 2012-12-28 ENCOUNTER — Other Ambulatory Visit: Payer: Self-pay | Admitting: *Deleted

## 2012-12-28 ENCOUNTER — Ambulatory Visit (HOSPITAL_BASED_OUTPATIENT_CLINIC_OR_DEPARTMENT_OTHER): Payer: 59 | Admitting: Oncology

## 2012-12-28 ENCOUNTER — Ambulatory Visit (HOSPITAL_COMMUNITY)
Admission: RE | Admit: 2012-12-28 | Discharge: 2012-12-28 | Disposition: A | Discharge status: Home / Self Care | Payer: 59 | Source: Ambulatory Visit | Attending: Oncology | Admitting: Oncology

## 2012-12-28 VITALS — BP 147/84 | HR 83 | Temp 97.4°F | Resp 18 | Ht 67.0 in | Wt 182.7 lb

## 2012-12-28 DIAGNOSIS — C189 Malignant neoplasm of colon, unspecified: Secondary | ICD-10-CM

## 2012-12-28 DIAGNOSIS — T451X5A Adverse effect of antineoplastic and immunosuppressive drugs, initial encounter: Secondary | ICD-10-CM

## 2012-12-28 DIAGNOSIS — G909 Disorder of the autonomic nervous system, unspecified: Secondary | ICD-10-CM

## 2012-12-28 DIAGNOSIS — M25559 Pain in unspecified hip: Secondary | ICD-10-CM | POA: Insufficient documentation

## 2012-12-28 DIAGNOSIS — F329 Major depressive disorder, single episode, unspecified: Secondary | ICD-10-CM

## 2012-12-28 MED ORDER — OXYCODONE-ACETAMINOPHEN 5-325 MG PO TABS: 1.0000 | ORAL_TABLET | Freq: Three times a day (TID) | ORAL | Status: DC | PRN

## 2012-12-28 NOTE — Progress Notes (Signed)
Spoke with MRI department regarding adding left hip to pelvis MRI on Friday. They are not able to do both on same day-patient is in an inpatient spot at this time. Will need to choose which area to scan for Friday and scan the other on another day.

## 2012-12-28 NOTE — Telephone Encounter (Signed)
Left VM that pain not any better. Asking to be called back. Called back : Reports pain left hip is constant aching pain and pain in her legs is an aching/squeezing pain that is rated 9/10 and constant. Neurontin 300 mg bid has not helped and she has been out of Percocet since 10/28 (1/2 tab bid). Reports pain has gotten even worse over the past two days. No swelling in her legs and denies any fever or chills. Asking MD for pain management assistance and also requesting a referral out to someone who can help her with her pain.

## 2012-12-28 NOTE — Telephone Encounter (Signed)
Per Dr. Benay Spice : Have her come in today at 3:30 pm to be seen. Will d/c Neurontin. Will order pelvic MRI +/- contrast: ? Recurrent rectal cancer ? Metastasis. Unable to reach patient on her phone-left VM with appointment . Called her husband and told him to have her come in today at 3:30 to be seen. He will have her come.

## 2012-12-28 NOTE — Progress Notes (Signed)
[place]    OFFICE PROGRESS NOTE   INTERVAL HISTORY:   Ms. Soltero returns prior to a scheduled visit. She completed a cycle of 5-fluorouracil on 12/22/2012.  She complains of severe pain at the left "hip "area. The pain is constant. The pain is chiefly present at the left iliac, but not, and trochanter areas. The pain is also present in the left groin and bilateral thigh. No dysuria or bleeding. No significant abdominal pain. No focal numbness or weakness. No back pain. She had a transient "stinging "pain in the left foot on Saturday. No fever. The skin breakdown at the rectum has healed.  Neurontin did not help the pain. Percocet partially relieves the pain.  Objective:  Vital signs in last 24 hours:  Blood pressure 147/84, pulse 83, temperature 97.4 F (36.3 C), temperature source Oral, resp. rate 18, height 5' 7"  (1.702 m), weight 182 lb 11.2 oz (82.872 kg), SpO2 100.00%.    Resp: Lungs clear bilaterally Cardio: Regular rate and rhythm GI: No hepatosplenomegaly, tender in the suprapubic area bilaterally, no mass, mild nodularity at the lower portion of the midline incision Vascular: No leg edema or erythema Neuro: The motor exam appears intact in the feet and legs bilaterally. The deep tendon reflexes are 2+ and symmetric at the knees  Skin: No rash or erythema. Radiation hyperpigmentation at the perineum without skin breakdown.  Musculoskeletal: Tender at the lower lumbar spine and sacrum. Tender at the left greater than right posterior iliac. Tender at the left trochanter. No mass at the left iliac, trochanter, or thigh. Full range of motion at the right hip without pain. Minimal discomfort with motion at the left hip.  Portacath/PICC-without erythema  Lab Results:  Lab Results  Component Value Date   WBC 3.7* 12/22/2012   HGB 12.1 12/22/2012   HCT 35.0 12/22/2012   MCV 90.5 12/22/2012   PLT 207 12/22/2012      Medications: I have reviewed the  patient's current medications.  Assessment/Plan: 1. 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 The tumor is associated with a bowel perforation. Tumor focally involved a radial surgical margin. She completed cycle 1 adjuvant FOLFOX 07/28/2012; cycle 2 08/11/2012; cycle 3 08/25/2012; cycle 4 09/08/2012. She began Xeloda/radiation 09/26/2012, completed 11/08/2012, adjuvant FOLFOX resumed 11/24/2012. 2. Bowel perforation/diverticulitis secondary to #1. 3. History of atrial fibrillation. 4. Family history of cancer. The sigmoid colon tumor 06/28/2012 was microsatellite stable and without loss of mismatch repair proteins. 5. Status post Port-A-Cath placement 07/11/2012. 6. Nausea and vomiting following cycle 1 FOLFOX. Aloxi was added with cycle 2. Now taking prophylactic Decadron following chemotherapy. 7. Depression.  8. "Muscle aches" following chemotherapy. ? manifestation of oxaliplatin neuropathy.  9. History of Abdominal pain-this is most likely not related to chemotherapy. Improved.  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 Not interfering with activity at present. 11. "Hot flashes". Question menopausal symptom. 12. Pruritus and erythema over the palms during the oxaliplatin infusion on 12/08/2012. Likely allergic reaction. Oxaliplatin was discontinued beginning with chemotherapy on 11/22/2012.   Disposition:  She continues to have severe pain at the left hip and thigh. She also complains of pain and tenderness at the sacrum and right thigh. The physical exam is unremarkable aside from tenderness at multiple sites. The etiology of her pain is unclear. This could be an unusual manifestation of oxaliplatin neuropathy, but we need to  consider other etiologies. I discussed the case with Dr. Lisbeth Renshaw and he does not feel the pain is related to radiation.  We decided to proceed with an MRI of the pelvis and plain x-ray of the left hip to further evaluate the pain. She will continue Percocet as needed for now.  Ms. Bensen will go to the emergency room for increased pain or new symptoms. We refilled a prescription for Percocet. She will return for an office visit as scheduled on 01/05/2013.   Betsy Coder, MD  12/28/2012  4:52 PM

## 2012-12-29 ENCOUNTER — Other Ambulatory Visit: Payer: Self-pay | Admitting: *Deleted

## 2012-12-30 ENCOUNTER — Ambulatory Visit (HOSPITAL_COMMUNITY)
Admission: RE | Admit: 2012-12-30 | Discharge: 2012-12-30 | Disposition: A | Discharge status: Home / Self Care | Payer: 59 | Source: Ambulatory Visit | Attending: Oncology | Admitting: Oncology

## 2012-12-30 ENCOUNTER — Telehealth: Payer: Self-pay | Admitting: *Deleted

## 2012-12-30 DIAGNOSIS — C189 Malignant neoplasm of colon, unspecified: Secondary | ICD-10-CM

## 2012-12-30 DIAGNOSIS — M25559 Pain in unspecified hip: Secondary | ICD-10-CM | POA: Insufficient documentation

## 2012-12-30 MED ORDER — GADOBENATE DIMEGLUMINE 529 MG/ML IV SOLN
20.0000 mL | Freq: Once | INTRAVENOUS | Status: AC | PRN
  Administered 2012-12-30: 17 mL via INTRAVENOUS

## 2012-12-30 NOTE — Telephone Encounter (Signed)
Message copied by Tania Ade on Fri Dec 30, 2012  4:01 PM ------      Message from: Betsy Coder B      Created: Wed Dec 28, 2012  8:43 PM       Please call patient, left hip xray is negative. Proceed with pelvic MRI ------

## 2012-12-30 NOTE — Telephone Encounter (Signed)
Notified patient that her hip xray and MRI were negative. Will follow up with her next week on next step to evaluate her pain cause and treatment.

## 2013-01-04 ENCOUNTER — Other Ambulatory Visit: Payer: Self-pay | Admitting: Oncology

## 2013-01-05 ENCOUNTER — Telehealth: Payer: Self-pay | Admitting: *Deleted

## 2013-01-05 ENCOUNTER — Ambulatory Visit (HOSPITAL_BASED_OUTPATIENT_CLINIC_OR_DEPARTMENT_OTHER): Payer: 59

## 2013-01-05 ENCOUNTER — Telehealth (INDEPENDENT_AMBULATORY_CARE_PROVIDER_SITE_OTHER): Payer: Self-pay

## 2013-01-05 ENCOUNTER — Other Ambulatory Visit (HOSPITAL_BASED_OUTPATIENT_CLINIC_OR_DEPARTMENT_OTHER): Payer: 59 | Admitting: Lab

## 2013-01-05 ENCOUNTER — Other Ambulatory Visit: Payer: Self-pay | Admitting: *Deleted

## 2013-01-05 ENCOUNTER — Ambulatory Visit (HOSPITAL_COMMUNITY)
Admission: RE | Admit: 2013-01-05 | Discharge: 2013-01-05 | Disposition: A | Discharge status: Home / Self Care | Payer: 59 | Source: Ambulatory Visit | Attending: Oncology | Admitting: Oncology

## 2013-01-05 ENCOUNTER — Other Ambulatory Visit: Payer: Self-pay | Admitting: Oncology

## 2013-01-05 ENCOUNTER — Ambulatory Visit (HOSPITAL_BASED_OUTPATIENT_CLINIC_OR_DEPARTMENT_OTHER): Payer: 59 | Admitting: Oncology

## 2013-01-05 ENCOUNTER — Other Ambulatory Visit: Payer: Self-pay

## 2013-01-05 VITALS — BP 143/91 | HR 71 | Resp 18

## 2013-01-05 VITALS — BP 144/94 | HR 78 | Temp 97.3°F | Resp 18 | Ht 67.0 in | Wt 179.0 lb

## 2013-01-05 DIAGNOSIS — Z452 Encounter for adjustment and management of vascular access device: Secondary | ICD-10-CM

## 2013-01-05 DIAGNOSIS — C187 Malignant neoplasm of sigmoid colon: Secondary | ICD-10-CM

## 2013-01-05 DIAGNOSIS — C189 Malignant neoplasm of colon, unspecified: Secondary | ICD-10-CM

## 2013-01-05 DIAGNOSIS — Z5111 Encounter for antineoplastic chemotherapy: Secondary | ICD-10-CM

## 2013-01-05 DIAGNOSIS — F329 Major depressive disorder, single episode, unspecified: Secondary | ICD-10-CM

## 2013-01-05 DIAGNOSIS — N951 Menopausal and female climacteric states: Secondary | ICD-10-CM

## 2013-01-05 DIAGNOSIS — K5710 Diverticulosis of small intestine without perforation or abscess without bleeding: Secondary | ICD-10-CM

## 2013-01-05 DIAGNOSIS — I10 Essential (primary) hypertension: Secondary | ICD-10-CM

## 2013-01-05 LAB — CBC WITH DIFFERENTIAL/PLATELET
Basophils Absolute: 0 10*3/uL (ref 0.0–0.1)
EOS%: 3.8 % (ref 0.0–7.0)
Eosinophils Absolute: 0.2 10*3/uL (ref 0.0–0.5)
LYMPH%: 13.1 % — ABNORMAL LOW (ref 14.0–49.7)
MCH: 30.8 pg (ref 25.1–34.0)
MCV: 91.6 fL (ref 79.5–101.0)
MONO%: 8.1 % (ref 0.0–14.0)
NEUT#: 3.9 10*3/uL (ref 1.5–6.5)
Platelets: 214 10*3/uL (ref 145–400)
RBC: 4.06 10*6/uL (ref 3.70–5.45)
nRBC: 0 % (ref 0–0)

## 2013-01-05 LAB — COMPREHENSIVE METABOLIC PANEL (CC13)
AST: 20 U/L (ref 5–34)
Albumin: 3.6 g/dL (ref 3.5–5.0)
Alkaline Phosphatase: 66 U/L (ref 40–150)
BUN: 16.2 mg/dL (ref 7.0–26.0)
Potassium: 3.8 mEq/L (ref 3.5–5.1)
Total Bilirubin: 0.43 mg/dL (ref 0.20–1.20)

## 2013-01-05 MED ORDER — FLUOROURACIL CHEMO INJECTION 2.5 GM/50ML
400.0000 mg/m2 | Freq: Once | INTRAVENOUS | Status: AC
  Administered 2013-01-05: 750 mg via INTRAVENOUS
  Filled 2013-01-05: qty 15

## 2013-01-05 MED ORDER — IOHEXOL 300 MG/ML  SOLN
10.0000 mL | Freq: Once | INTRAMUSCULAR | Status: AC | PRN
  Administered 2013-01-05: 7 mL via INTRAVENOUS

## 2013-01-05 MED ORDER — OXYCODONE-ACETAMINOPHEN 5-325 MG PO TABS
ORAL_TABLET | ORAL | Status: AC
  Filled 2013-01-05: qty 1

## 2013-01-05 MED ORDER — HEPARIN SOD (PORK) LOCK FLUSH 100 UNIT/ML IV SOLN
INTRAVENOUS | Status: AC
  Filled 2013-01-05: qty 5

## 2013-01-05 MED ORDER — LEUCOVORIN CALCIUM INJECTION 350 MG
750.0000 mg | Freq: Once | INTRAVENOUS | Status: AC
  Administered 2013-01-05: 750 mg via INTRAVENOUS
  Filled 2013-01-05: qty 37.5

## 2013-01-05 MED ORDER — HEPARIN SOD (PORK) LOCK FLUSH 100 UNIT/ML IV SOLN
250.0000 [IU] | Freq: Once | INTRAVENOUS | Status: DC | PRN
Start: 2013-01-05 — End: 2013-01-05
  Filled 2013-01-05: qty 5

## 2013-01-05 MED ORDER — OXYCODONE-ACETAMINOPHEN 5-325 MG PO TABS
1.0000 | ORAL_TABLET | Freq: Once | ORAL | Status: AC
  Administered 2013-01-05: 1 via ORAL

## 2013-01-05 MED ORDER — DIPHENHYDRAMINE HCL 25 MG PO CAPS
ORAL_CAPSULE | ORAL | Status: AC
  Filled 2013-01-05: qty 1

## 2013-01-05 MED ORDER — SODIUM CHLORIDE 0.9 % IJ SOLN
10.0000 mL | INTRAMUSCULAR | Status: DC | PRN
Start: 2013-01-05 — End: 2013-01-05
  Filled 2013-01-05: qty 10

## 2013-01-05 MED ORDER — OXYCODONE-ACETAMINOPHEN 5-325 MG PO TABS: 1.0000 | ORAL_TABLET | Freq: Three times a day (TID) | ORAL | Status: DC | PRN

## 2013-01-05 MED ORDER — LIDOCAINE HCL 1 % IJ SOLN
INTRAMUSCULAR | Status: AC
  Filled 2013-01-05: qty 20

## 2013-01-05 MED ORDER — VENLAFAXINE HCL ER 37.5 MG PO CP24: 37.5000 mg | ORAL_CAPSULE | Freq: Every day | ORAL | Status: DC

## 2013-01-05 MED ORDER — DIPHENHYDRAMINE HCL 25 MG PO CAPS
25.0000 mg | ORAL_CAPSULE | Freq: Once | ORAL | Status: AC
  Administered 2013-01-05: 25 mg via ORAL

## 2013-01-05 MED ORDER — FLUOROURACIL CHEMO INJECTION 5 GM/100ML
2400.0000 mg/m2 | INTRAVENOUS | Status: DC
  Administered 2013-01-05: 4650 mg via INTRAVENOUS
  Filled 2013-01-05: qty 93

## 2013-01-05 MED ORDER — ALTEPLASE 2 MG IJ SOLR
2.0000 mg | Freq: Once | INTRAMUSCULAR | Status: AC | PRN
  Administered 2013-01-05: 2 mg
  Filled 2013-01-05: qty 2

## 2013-01-05 NOTE — Progress Notes (Signed)
Chaplain made initial visit. Pt said that she was "very tired," and explained that her port was not working today. She stated that a pick line had been inserted. Pt said that although this was a hard day, she was nearing the end of her treatment and was "glad to be alive." She stated that she has been in treatment since April. Chaplain practiced reflective listening and empathic presence.

## 2013-01-05 NOTE — Procedures (Signed)
The existing portacath is not aspirating and the tip is against the vein wall.  Discussed with Dr. Benay Spice and decided to place PICC line in order to complete treatment.  Placed a right arm single lumen Power PICC.  Length = 36 cm and tip in lower SVC.

## 2013-01-05 NOTE — Progress Notes (Signed)
Asked to check pt's port. Pt. was accessed twice prior.  Small hard knot noted immediately above Port and accessed HN site. Pt. stated that it is tender to touch.  Attempted to flush line with saline without resistance. Unable to obtain blood return.  Flushed again slowly without resistance then pt. started complaining of burning and tingling sensation up lt. shoulder and down left arm. Pt. stated that this feeling occurred when she was first accessed today.  Pt. then stated that she was having difficulty breathing. Colour was good and pt. was not in respiratory distress. She took a few deep breaths.  BP at 143/91 P 71 with O2 Sat at 98%.  I instructed and encouraged pt. to use relaxation deep breathing.    At 13:20hr, pt. transferred to radiology for Dye Study via wheelchair with Nurse Tech.  She was smiling upon transfer and less anxious.   HL

## 2013-01-05 NOTE — Progress Notes (Signed)
TPA instilled to port a cath @ 1035 am & checked q 30 min x 2 hours with negative blood return.  Charge RN, Amy d/c'd IV & restuck pt with 20 ga cath & still no blood return.  Reported to Dr. Benay Spice & pt sent to IR to have dye study to check port.   Report from Dr. Anselm Pancoast states that pt's port is embedded in vessel & will never give blood return.  He discussed with Dr Benay Spice & decision was made to insert PICC. Pt returned to infusion with PICC right upper arm with good blood return, pressure dsg in place & pt will return tomorrow to redress.

## 2013-01-05 NOTE — Telephone Encounter (Signed)
Sheri Calderon with Dr Benay Spice office called to see if Dr Ninfa Linden would be okay with IR to pull pt.'s nonworking port a cath. Paged Dr Ninfa Linden and he is ok with IR taking port out. Called Sheri Calderon back and let her know.

## 2013-01-05 NOTE — Progress Notes (Signed)
[place]    OFFICE PROGRESS NOTE   INTERVAL HISTORY:   Sheri Calderon returns for scheduled visit. She was last treated with 5 fluorouracil on 12/22/2012. No mouth sores or diarrhea. She reports nausea following chemotherapy that was relieved with Zofran. She has noted decreased visual acuity. She continues to have pain in the hips on the left greater than right. She also reports "numbness "in the thighs after sitting for a prolonged time. She has arm numbness with the arms in certain positions. No difficulty with bowel or bladder function. She is now taking Percocet approximately twice daily.  Objective:  Vital signs in last 24 hours:  Blood pressure 144/94, pulse 78, temperature 97.3 F (36.3 C), temperature source Oral, resp. rate 18, height 5' 7"  (1.702 m), weight 179 lb (81.194 kg).    HEENT: No thrush or ulcers Resp: Lungs clear bilaterally Cardio: Regular rate and rhythm GI: No hepatomegaly, nontender, no mass Vascular: No leg edema Neuro: The motor exam appears intact in the upper and lower extremities  Skin: No rash  Musculoskeletal: Tender at the lumbar spine, bilateral iliac, and left greater than right trochanter. No mass or rash  Portacath/PICC-without erythema  Lab Results:  Lab Results  Component Value Date   WBC 5.2 01/05/2013   HGB 12.5 01/05/2013   HCT 37.2 01/05/2013   MCV 91.6 01/05/2013   PLT 214 01/05/2013   ANC 3.9    Medications: I have reviewed the patient's current medications.  Assessment/Plan: 1. 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 The tumor is associated with a bowel perforation. Tumor focally involved a radial surgical margin. She completed cycle 1 adjuvant FOLFOX 07/28/2012; cycle 2 08/11/2012; cycle 3 08/25/2012; cycle 4 09/08/2012. She began Xeloda/radiation 09/26/2012, completed 11/08/2012, adjuvant FOLFOX  resumed 11/24/2012. Oxaliplatin was discontinued from the chemotherapy regimen beginning on 12/22/2012 2. Bowel perforation/diverticulitis secondary to #1. 3. History of atrial fibrillation. 4. Family history of cancer. The sigmoid colon tumor 06/28/2012 was microsatellite stable and without loss of mismatch repair proteins. 5. Status post Port-A-Cath placement 07/11/2012. 6. Nausea and vomiting following cycle 1 FOLFOX. Aloxi was added with cycle 2.  7. Depression.  8. "Muscle aches" following chemotherapy. ? manifestation of oxaliplatin neuropathy.  9. History of Abdominal pain-this is most likely not related to chemotherapy. Improved.  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 Not interfering with activity at present. 11. "Hot flashes". Question menopausal symptom. 12. Pruritus and erythema over the palms during the oxaliplatin infusion on 12/08/2012. Likely allergic reaction. Oxaliplatin was discontinued beginning with chemotherapy on 11/22/2012. 13. Persistent pain at the low back and "hips "-negative pelvic MRI 12/30/2012,-negative plain x-ray of the left hip 12/28/2012 14. Hypertension-her blood pressure has been elevated on multiple office visits here. I recommended she obtain a primary physician for management of hypertension.   Disposition:  She continues to have pain at the low back and hips. She reports numbness in the thighs with prolonged sitting. It is possible her symptoms are related to an unusual manifestation of oxaliplatin neuropathy. Oxaliplatin remains on hold. The plan is to complete 2 additional cycles of 5-fluorouracil/leucovorin.  We will check an erythrocyte sedimentation rate today to look for evidence of an inflammatory condition. Ms. All will return for office visit in 2 weeks. I encouraged her to try ibuprofen or naproxen for relief of the pain.   Betsy Coder, MD  01/05/2013  9:38 AM

## 2013-01-05 NOTE — Telephone Encounter (Signed)
Received return call from [facility] with Dr. Ninfa Linden, [place] to send to IR for port removal. Dr. Benay Spice made aware.

## 2013-01-05 NOTE — Telephone Encounter (Signed)
Left message with Sharyn Lull, nurse for Dr. Ninfa Linden to find out if OK for Interventional Radiology to DC port. Per IR port can not be salvaged. Pt has PICC for chemo.

## 2013-01-06 ENCOUNTER — Ambulatory Visit: Payer: 59

## 2013-01-06 ENCOUNTER — Other Ambulatory Visit: Payer: Self-pay | Admitting: Radiology

## 2013-01-06 ENCOUNTER — Other Ambulatory Visit: Payer: Self-pay | Admitting: Oncology

## 2013-01-06 DIAGNOSIS — C189 Malignant neoplasm of colon, unspecified: Secondary | ICD-10-CM

## 2013-01-06 MED ORDER — SODIUM CHLORIDE 0.9 % IJ SOLN
10.0000 mL | INTRAMUSCULAR | Status: DC | PRN
  Filled 2013-01-06: qty 10

## 2013-01-06 MED ORDER — HEPARIN SOD (PORK) LOCK FLUSH 100 UNIT/ML IV SOLN
500.0000 [IU] | Freq: Once | INTRAVENOUS | Status: AC | PRN
  Filled 2013-01-06: qty 5

## 2013-01-06 NOTE — Progress Notes (Unsigned)
Patient came in to have her picc line checked because her infusion pump beeped at home due to line being pinched. Since this morning patient states pump is running fine.Observed pump to be infusing without any issues. No blood in line, no complaints of pain. Patient encouraged to call clinic if she experiences any further problems.Patient verbalized understanding

## 2013-01-07 ENCOUNTER — Ambulatory Visit (HOSPITAL_BASED_OUTPATIENT_CLINIC_OR_DEPARTMENT_OTHER): Payer: 59

## 2013-01-07 VITALS — BP 136/93 | HR 92 | Temp 97.0°F

## 2013-01-07 DIAGNOSIS — Z452 Encounter for adjustment and management of vascular access device: Secondary | ICD-10-CM

## 2013-01-07 DIAGNOSIS — C187 Malignant neoplasm of sigmoid colon: Secondary | ICD-10-CM

## 2013-01-07 DIAGNOSIS — C189 Malignant neoplasm of colon, unspecified: Secondary | ICD-10-CM

## 2013-01-07 MED ORDER — SODIUM CHLORIDE 0.9 % IJ SOLN
10.0000 mL | INTRAMUSCULAR | Status: DC | PRN
  Administered 2013-01-07: 10 mL
  Filled 2013-01-07: qty 10

## 2013-01-07 MED ORDER — HEPARIN SOD (PORK) LOCK FLUSH 100 UNIT/ML IV SOLN
250.0000 [IU] | Freq: Once | INTRAVENOUS | Status: AC
  Administered 2013-01-07: 250 [IU] via INTRAVENOUS
  Filled 2013-01-07: qty 5

## 2013-01-07 MED ORDER — HEPARIN SOD (PORK) LOCK FLUSH 100 UNIT/ML IV SOLN
500.0000 [IU] | Freq: Once | INTRAVENOUS | Status: DC | PRN
  Filled 2013-01-07: qty 5

## 2013-01-09 ENCOUNTER — Ambulatory Visit (HOSPITAL_BASED_OUTPATIENT_CLINIC_OR_DEPARTMENT_OTHER): Payer: 59

## 2013-01-09 DIAGNOSIS — C189 Malignant neoplasm of colon, unspecified: Secondary | ICD-10-CM

## 2013-01-09 DIAGNOSIS — Z452 Encounter for adjustment and management of vascular access device: Secondary | ICD-10-CM

## 2013-01-09 DIAGNOSIS — C187 Malignant neoplasm of sigmoid colon: Secondary | ICD-10-CM

## 2013-01-09 MED ORDER — HEPARIN SOD (PORK) LOCK FLUSH 100 UNIT/ML IV SOLN
500.0000 [IU] | Freq: Once | INTRAVENOUS | Status: AC
  Administered 2013-01-09: 250 [IU] via INTRAVENOUS
  Filled 2013-01-09: qty 5

## 2013-01-09 MED ORDER — SODIUM CHLORIDE 0.9 % IJ SOLN
10.0000 mL | INTRAMUSCULAR | Status: DC | PRN
  Administered 2013-01-09: 10 mL via INTRAVENOUS
  Filled 2013-01-09: qty 10

## 2013-01-10 ENCOUNTER — Encounter (HOSPITAL_COMMUNITY): Payer: Self-pay

## 2013-01-10 ENCOUNTER — Ambulatory Visit (HOSPITAL_COMMUNITY)
Admission: RE | Admit: 2013-01-10 | Discharge: 2013-01-10 | Disposition: A | Discharge status: Home / Self Care | Payer: 59 | Source: Ambulatory Visit | Attending: Oncology | Admitting: Oncology

## 2013-01-10 DIAGNOSIS — C187 Malignant neoplasm of sigmoid colon: Secondary | ICD-10-CM

## 2013-01-10 DIAGNOSIS — Z87891 Personal history of nicotine dependence: Secondary | ICD-10-CM | POA: Insufficient documentation

## 2013-01-10 DIAGNOSIS — Z79899 Other long term (current) drug therapy: Secondary | ICD-10-CM | POA: Insufficient documentation

## 2013-01-10 DIAGNOSIS — Z9221 Personal history of antineoplastic chemotherapy: Secondary | ICD-10-CM | POA: Insufficient documentation

## 2013-01-10 DIAGNOSIS — T82598A Other mechanical complication of other cardiac and vascular devices and implants, initial encounter: Secondary | ICD-10-CM | POA: Insufficient documentation

## 2013-01-10 DIAGNOSIS — Y831 Surgical operation with implant of artificial internal device as the cause of abnormal reaction of the patient, or of later complication, without mention of misadventure at the time of the procedure: Secondary | ICD-10-CM | POA: Insufficient documentation

## 2013-01-10 HISTORY — DX: Personal history of antineoplastic chemotherapy: Z92.21

## 2013-01-10 MED ORDER — MIDAZOLAM HCL 2 MG/2ML IJ SOLN
INTRAMUSCULAR | Status: AC | PRN
  Administered 2013-01-10 (×4): 1 mg via INTRAVENOUS

## 2013-01-10 MED ORDER — FENTANYL CITRATE 0.05 MG/ML IJ SOLN
INTRAMUSCULAR | Status: AC
  Filled 2013-01-10: qty 6

## 2013-01-10 MED ORDER — FENTANYL CITRATE 0.05 MG/ML IJ SOLN
INTRAMUSCULAR | Status: AC | PRN
  Administered 2013-01-10: 50 ug via INTRAVENOUS
  Administered 2013-01-10: 100 ug via INTRAVENOUS
  Administered 2013-01-10: 50 ug via INTRAVENOUS

## 2013-01-10 MED ORDER — LIDOCAINE-EPINEPHRINE (PF) 2 %-1:200000 IJ SOLN
INTRAMUSCULAR | Status: AC
  Filled 2013-01-10: qty 20

## 2013-01-10 MED ORDER — CEFAZOLIN SODIUM-DEXTROSE 2-3 GM-% IV SOLR
2.0000 g | Freq: Once | INTRAVENOUS | Status: AC
  Administered 2013-01-10: 2 g via INTRAVENOUS
  Filled 2013-01-10: qty 50

## 2013-01-10 MED ORDER — MIDAZOLAM HCL 2 MG/2ML IJ SOLN
INTRAMUSCULAR | Status: AC
  Filled 2013-01-10: qty 6

## 2013-01-10 MED ORDER — SODIUM CHLORIDE 0.9 % IV SOLN: INTRAVENOUS | Status: DC

## 2013-01-10 MED ORDER — HEPARIN SOD (PORK) LOCK FLUSH 100 UNIT/ML IV SOLN
250.0000 [IU] | INTRAVENOUS | Status: AC | PRN
  Administered 2013-01-10: 250 [IU]
  Filled 2013-01-10: qty 5

## 2013-01-10 NOTE — Progress Notes (Signed)
[facility] cancelled appointment for 11/12 piccline flush.  Pt is here in short stay today for a procedure and the piccline is being flushed today.  Pt requested tomorrow's appointment be cancelled since we flushed it today.

## 2013-01-10 NOTE — H&P (Signed)
Chief Complaint: "I am here for my port removal." Referring Physician: Dr. Benay Spice HPI: Sheri Calderon is an 47 y.o. female with PMHx of adenocarcinoma of the sigmoid colon. The patient had a left sided port-a-catheter placed 06/2012, she had been having difficulty with blood return during her sessions and was brought to IR on 01/05/13 where a port injection showed that the catheter tip was against the vein wall. It was determined given malposition of port a catheter tip that a PICC line would be placed and port would be removed. The patient currently has a functioning right arm PICC line. She denies any recent illness, fever or chills. She denies any drainage or redness at port site. She denies any chest pain or shortness of breath. She denies any use of blood thinners or active bleeding. She has previously tolerated sedation and penicillin based antibiotics with no difficulties or allergic reactions.   Past Medical History:  Past Medical History  Diagnosis Date  . Lower extremity edema   . Restless legs syndrome   . Diverticulitis     "this is my 2nd time in hospital w/this in the last 2 wk" (06/21/2012)  . Atrial fibrillation     resolved - does not see cardiologist  . Colon cancer 2014    cancer of the sigmoid colon  . S/P chemotherapy, time since less than 4 weeks     Past Surgical History:  Past Surgical History  Procedure Laterality Date  . Tubal ligation  ~ 1989  . Partial colectomy N/A 06/28/2012    Procedure: PARTIAL COLECTOMY;  Surgeon: Harl Bowie, MD;  Location: [facility];  Service: General;  Laterality: N/A;  . Portacath placement N/A 07/11/2012    Procedure: INSERTION PORT-A-CATH;  Surgeon: Harl Bowie, MD;  Location: [facility];  Service: General;  Laterality: N/A;    Family History:  Family History  Problem Relation Age of Onset  . Diabetes Mother   . Heart disease Mother   . Stomach cancer Mother   . Cancer Mother 35    ovarian cancer  . COPD Father   .  Hypertension Father   . Hyperlipidemia Father   . Colon polyps Father     56 lifetime colon polyps  . Down syndrome Paternal Aunt   . Cancer Paternal Uncle     cancer in the spine  . COPD Maternal Grandmother   . Stomach cancer Paternal Grandmother     Social History:  reports that she quit smoking about 7 months ago. Her smoking use included Cigarettes. She has a 15 pack-year smoking history. She has never used smokeless tobacco. She reports that she does not drink alcohol or use illicit drugs.  Allergies:  Allergies  Allergen Reactions  . Lorazepam Hives and Swelling    Patient reports receiving lorazepam intensol in the hospital and experienced swelling with hives.      Medication List    ASK your doctor about these medications       ALPRAZolam 0.5 MG tablet  Commonly known as:  XANAX  TAKE 1 TABLET BY MOUTH TWICE A DAY AS NEEDED FOR ANXIETY     dexamethasone 4 MG tablet  Commonly known as:  DECADRON  Take 4 mg by mouth as needed. Not taking bid, states takes sometimes day after chemo if real nauseaed     diphenoxylate-atropine 2.5-0.025 MG per tablet  Commonly known as:  LOMOTIL  Take 1 tablet by mouth 4 (four) times daily as needed for diarrhea or  loose stools. May take 2 tablets, 4 times daily, if necessary.     lidocaine-prilocaine cream  Commonly known as:  EMLA  Apply topically as needed. Apply 1 teaspoon to PAC site 1-2 hours prior to stick and cover with plastic wrap to numb site     ondansetron 8 MG tablet  Commonly known as:  ZOFRAN  Take 1 tablet (8 mg total) by mouth every 8 (eight) hours as needed for nausea.     oxyCODONE-acetaminophen 5-325 MG per tablet  Commonly known as:  PERCOCET/ROXICET  Take 1 tablet by mouth 3 (three) times daily as needed.     venlafaxine XR 37.5 MG 24 hr capsule  Commonly known as:  EFFEXOR-XR  Take 1 capsule (37.5 mg total) by mouth daily.       Please HPI for pertinent positives, otherwise complete 10 system ROS  negative.  Physical Exam: BP 138/86  Pulse 73  Temp(Src) 96.3 F (35.7 C) (Oral)  Resp 20  SpO2 97% There is no weight on file to calculate BMI.  General Appearance:  Alert, cooperative, no distress  Head:  Normocephalic, without obvious abnormality, atraumatic  Neck: Supple, symmetrical, trachea midline  Lungs:   Clear to auscultation bilaterally, no w/r/r, respirations unlabored without use of accessory muscles.  Chest Wall:  No tenderness or skin changes. Left port a catheter intact  Heart:  Regular rate and rhythm, S1, S2 normal, no murmur, rub or gallop.  Abdomen:   Soft, non-tender, non distended, (+) BS  Extremities: Extremities normal, RUE PICC line intact, atraumatic, no cyanosis or edema  Neurologic: Normal affect, no gross deficits.   Recent Results (from the past 2160 hour(s))  CBC WITH DIFFERENTIAL     Status: Abnormal   Collection Time    10/14/12  9:13 AM      Result Value Range   WBC 3.9  3.9 - 10.3 10e3/uL   NEUT# 2.8  1.5 - 6.5 10e3/uL   HGB 11.0 (*) 11.6 - 15.9 g/dL   HCT 33.8 (*) 34.8 - 46.6 %   Platelets 170  145 - 400 10e3/uL   MCV 87.3  79.5 - 101.0 fL   MCH 28.4  25.1 - 34.0 pg   MCHC 32.5  31.5 - 36.0 g/dL   RBC 3.87  3.70 - 5.45 10e6/uL   RDW 17.4 (*) 11.2 - 14.5 %   lymph# 0.5 (*) 0.9 - 3.3 10e3/uL   MONO# 0.3  0.1 - 0.9 10e3/uL   Eosinophils Absolute 0.3  0.0 - 0.5 10e3/uL   Basophils Absolute 0.0  0.0 - 0.1 10e3/uL   NEUT% 70.8  38.4 - 76.8 %   LYMPH% 13.1 (*) 14.0 - 49.7 %   MONO% 7.0  0.0 - 14.0 %   EOS% 8.8 (*) 0.0 - 7.0 %   BASO% 0.3  0.0 - 2.0 %  COMPREHENSIVE METABOLIC PANEL (PN30)     Status: Abnormal   Collection Time    10/14/12  9:13 AM      Result Value Range   Sodium 142  136 - 145 mEq/L   Potassium 3.7  3.5 - 5.1 mEq/L   Chloride 110 (*) 98 - 109 mEq/L   CO2 23  22 - 29 mEq/L   Glucose 101  70 - 140 mg/dl   BUN 10.3  7.0 - 26.0 mg/dL   Creatinine 0.8  0.6 - 1.1 mg/dL   Total Bilirubin 0.32  0.20 - 1.20 mg/dL   Alkaline  Phosphatase 61  40 -  150 U/L   AST 23  5 - 34 U/L   ALT 26  0 - 55 U/L   Total Protein 6.5  6.4 - 8.3 g/dL   Albumin 3.1 (*) 3.5 - 5.0 g/dL   Calcium 8.7  8.4 - 10.4 mg/dL  CBC WITH DIFFERENTIAL     Status: Abnormal   Collection Time    11/24/12  7:44 AM      Result Value Range   WBC 5.4  3.9 - 10.3 10e3/uL   NEUT# 3.8  1.5 - 6.5 10e3/uL   HGB 13.3  11.6 - 15.9 g/dL   HCT 38.9  34.8 - 46.6 %   Platelets 300  145 - 400 10e3/uL   MCV 90.2  79.5 - 101.0 fL   MCH 30.8  25.1 - 34.0 pg   MCHC 34.2  31.5 - 36.0 g/dL   RBC 4.32  3.70 - 5.45 10e6/uL   RDW 17.4 (*) 11.2 - 14.5 %   lymph# 0.6 (*) 0.9 - 3.3 10e3/uL   MONO# 0.5  0.1 - 0.9 10e3/uL   Eosinophils Absolute 0.5  0.0 - 0.5 10e3/uL   Basophils Absolute 0.1  0.0 - 0.1 10e3/uL   NEUT% 70.1  38.4 - 76.8 %   LYMPH% 11.5 (*) 14.0 - 49.7 %   MONO% 8.8  0.0 - 14.0 %   EOS% 8.4 (*) 0.0 - 7.0 %   BASO% 1.2  0.0 - 2.0 %  COMPREHENSIVE METABOLIC PANEL (IO03)     Status: None   Collection Time    11/24/12  7:45 AM      Result Value Range   Sodium 143  136 - 145 mEq/L   Potassium 3.9  3.5 - 5.1 mEq/L   Chloride 107  98 - 109 mEq/L   CO2 26  22 - 29 mEq/L   Glucose 86  70 - 140 mg/dl   BUN 12.7  7.0 - 26.0 mg/dL   Creatinine 0.8  0.6 - 1.1 mg/dL   Total Bilirubin 0.35  0.20 - 1.20 mg/dL   Alkaline Phosphatase 67  40 - 150 U/L   AST 22  5 - 34 U/L   ALT 27  0 - 55 U/L   Total Protein 7.3  6.4 - 8.3 g/dL   Albumin 3.7  3.5 - 5.0 g/dL   Calcium 9.5  8.4 - 10.4 mg/dL  CBC WITH DIFFERENTIAL     Status: Abnormal   Collection Time    12/08/12  8:51 AM      Result Value Range   WBC 3.0 (*) 3.9 - 10.3 10e3/uL   NEUT# 1.6  1.5 - 6.5 10e3/uL   HGB 11.7  11.6 - 15.9 g/dL   HCT 33.7 (*) 34.8 - 46.6 %   Platelets 203  145 - 400 10e3/uL   MCV 90.0  79.5 - 101.0 fL   MCH 31.3  25.1 - 34.0 pg   MCHC 34.8  31.5 - 36.0 g/dL   RBC 3.74  3.70 - 5.45 10e6/uL   RDW 16.6 (*) 11.2 - 14.5 %   lymph# 0.6 (*) 0.9 - 3.3 10e3/uL   MONO# 0.4  0.1 - 0.9  10e3/uL   Eosinophils Absolute 0.4  0.0 - 0.5 10e3/uL   Basophils Absolute 0.0  0.0 - 0.1 10e3/uL   NEUT% 54.8  38.4 - 76.8 %   LYMPH% 19.1  14.0 - 49.7 %   MONO% 12.3  0.0 - 14.0 %   EOS% 12.4 (*) 0.0 -  7.0 %   BASO% 1.4  0.0 - 2.0 %  COMPREHENSIVE METABOLIC PANEL (EG31)     Status: Abnormal   Collection Time    12/08/12  8:52 AM      Result Value Range   Sodium 143  136 - 145 mEq/L   Potassium 3.7  3.5 - 5.1 mEq/L   Chloride 104  98 - 109 mEq/L   CO2 29  22 - 29 mEq/L   Glucose 92  70 - 140 mg/dl   BUN 13.9  7.0 - 26.0 mg/dL   Creatinine 0.8  0.6 - 1.1 mg/dL   Total Bilirubin 0.29  0.20 - 1.20 mg/dL   Alkaline Phosphatase 63  40 - 150 U/L   AST 20  5 - 34 U/L   ALT 21  0 - 55 U/L   Total Protein 6.5  6.4 - 8.3 g/dL   Albumin 3.2 (*) 3.5 - 5.0 g/dL   Calcium 8.8  8.4 - 10.4 mg/dL   Anion Gap 10  3 - 11 mEq/L  CBC WITH DIFFERENTIAL     Status: Abnormal   Collection Time    12/22/12  8:55 AM      Result Value Range   WBC 3.7 (*) 3.9 - 10.3 10e3/uL   NEUT# 2.6  1.5 - 6.5 10e3/uL   HGB 12.1  11.6 - 15.9 g/dL   HCT 35.0  34.8 - 46.6 %   Platelets 207  145 - 400 10e3/uL   MCV 90.5  79.5 - 101.0 fL   MCH 31.2  25.1 - 34.0 pg   MCHC 34.5  31.5 - 36.0 g/dL   RBC 3.87  3.70 - 5.45 10e6/uL   RDW 16.5 (*) 11.2 - 14.5 %   lymph# 0.4 (*) 0.9 - 3.3 10e3/uL   MONO# 0.4  0.1 - 0.9 10e3/uL   Eosinophils Absolute 0.2  0.0 - 0.5 10e3/uL   Basophils Absolute 0.0  0.0 - 0.1 10e3/uL   NEUT% 71.1  38.4 - 76.8 %   LYMPH% 12.1 (*) 14.0 - 49.7 %   MONO% 11.2  0.0 - 14.0 %   EOS% 4.3  0.0 - 7.0 %   BASO% 1.3  0.0 - 2.0 %  COMPREHENSIVE METABOLIC PANEL (DV76)     Status: Abnormal   Collection Time    12/22/12  8:56 AM      Result Value Range   Sodium 143  136 - 145 mEq/L   Potassium 4.1  3.5 - 5.1 mEq/L   Chloride 108  98 - 109 mEq/L   CO2 27  22 - 29 mEq/L   Glucose 105  70 - 140 mg/dl   BUN 12.7  7.0 - 26.0 mg/dL   Creatinine 0.9  0.6 - 1.1 mg/dL   Total Bilirubin 0.26  0.20 - 1.20  mg/dL   Alkaline Phosphatase 70  40 - 150 U/L   AST 21  5 - 34 U/L   ALT 20  0 - 55 U/L   Total Protein 6.8  6.4 - 8.3 g/dL   Albumin 3.2 (*) 3.5 - 5.0 g/dL   Calcium 9.1  8.4 - 10.4 mg/dL   Anion Gap 8  3 - 11 mEq/L  COMPREHENSIVE METABOLIC PANEL (HY07)     Status: None   Collection Time    01/05/13  8:12 AM      Result Value Range   Sodium 139  136 - 145 mEq/L   Potassium 3.8  3.5 - 5.1 mEq/L  Chloride 106  98 - 109 mEq/L   CO2 24  22 - 29 mEq/L   Glucose 92  70 - 140 mg/dl   BUN 16.2  7.0 - 26.0 mg/dL   Creatinine 0.9  0.6 - 1.1 mg/dL   Total Bilirubin 0.43  0.20 - 1.20 mg/dL   Alkaline Phosphatase 66  40 - 150 U/L   AST 20  5 - 34 U/L   ALT 18  0 - 55 U/L   Total Protein 7.0  6.4 - 8.3 g/dL   Albumin 3.6  3.5 - 5.0 g/dL   Calcium 9.6  8.4 - 10.4 mg/dL   Anion Gap 10  3 - 11 mEq/L  CBC WITH DIFFERENTIAL     Status: Abnormal   Collection Time    01/05/13  8:13 AM      Result Value Range   WBC 5.2  3.9 - 10.3 10e3/uL   NEUT# 3.9  1.5 - 6.5 10e3/uL   HGB 12.5  11.6 - 15.9 g/dL   HCT 37.2  34.8 - 46.6 %   Platelets 214  145 - 400 10e3/uL   MCV 91.6  79.5 - 101.0 fL   MCH 30.8  25.1 - 34.0 pg   MCHC 33.6  31.5 - 36.0 g/dL   RBC 4.06  3.70 - 5.45 10e6/uL   RDW 15.6 (*) 11.2 - 14.5 %   lymph# 0.7 (*) 0.9 - 3.3 10e3/uL   MONO# 0.4  0.1 - 0.9 10e3/uL   Eosinophils Absolute 0.2  0.0 - 0.5 10e3/uL   Basophils Absolute 0.0  0.0 - 0.1 10e3/uL   NEUT% 74.4  38.4 - 76.8 %   LYMPH% 13.1 (*) 14.0 - 49.7 %   MONO% 8.1  0.0 - 14.0 %   EOS% 3.8  0.0 - 7.0 %   BASO% 0.6  0.0 - 2.0 %   nRBC 0  0 - 0 %   Assessment/Plan Adenocarcinoma of the sigmoid colon. Left sided port-a-catheter malpositioned against vein wall with no blood return s/p port injection 11/6, RUE PICC in place and functioning.  Request for port-a-catheter removal.  Labs reviewed from 11/6, patient is not on any blood thinners, she has been NPO, ancef is ordered. Risks and Benefits discussed with the  patient. All of the patient's questions were answered, patient is agreeable to proceed. Consent signed and in chart.   Tsosie Billing D PA-C 01/10/2013, 10:00 AM

## 2013-01-10 NOTE — Procedures (Signed)
Successful removal of left anterior chest wall port-a-cath. No immediate post procedural complications.

## 2013-01-13 ENCOUNTER — Ambulatory Visit (HOSPITAL_BASED_OUTPATIENT_CLINIC_OR_DEPARTMENT_OTHER): Payer: 59

## 2013-01-13 ENCOUNTER — Telehealth: Payer: Self-pay | Admitting: Oncology

## 2013-01-13 VITALS — BP 118/80 | HR 92 | Temp 98.2°F | Resp 18

## 2013-01-13 DIAGNOSIS — Z452 Encounter for adjustment and management of vascular access device: Secondary | ICD-10-CM

## 2013-01-13 DIAGNOSIS — C187 Malignant neoplasm of sigmoid colon: Secondary | ICD-10-CM

## 2013-01-13 DIAGNOSIS — C189 Malignant neoplasm of colon, unspecified: Secondary | ICD-10-CM

## 2013-01-13 MED ORDER — HEPARIN SOD (PORK) LOCK FLUSH 100 UNIT/ML IV SOLN
500.0000 [IU] | Freq: Once | INTRAVENOUS | Status: AC
  Administered 2013-01-13: 250 [IU] via INTRAVENOUS
  Filled 2013-01-13: qty 5

## 2013-01-13 MED ORDER — SODIUM CHLORIDE 0.9 % IJ SOLN
10.0000 mL | INTRAMUSCULAR | Status: DC | PRN
  Administered 2013-01-13: 10 mL via INTRAVENOUS
  Filled 2013-01-13: qty 10

## 2013-01-13 NOTE — Telephone Encounter (Signed)
Pt came by and wants flush earlier today than 3pm

## 2013-01-15 ENCOUNTER — Other Ambulatory Visit: Payer: Self-pay | Admitting: Oncology

## 2013-01-16 ENCOUNTER — Other Ambulatory Visit: Payer: Self-pay | Admitting: Oncology

## 2013-01-16 DIAGNOSIS — C189 Malignant neoplasm of colon, unspecified: Secondary | ICD-10-CM

## 2013-01-17 ENCOUNTER — Telehealth: Payer: Self-pay | Admitting: Oncology

## 2013-01-17 NOTE — Telephone Encounter (Signed)
returned pt call and advised pt to call back with any concerns...advised pt on current appts.

## 2013-01-18 ENCOUNTER — Other Ambulatory Visit: Payer: Self-pay | Admitting: Nurse Practitioner

## 2013-01-18 ENCOUNTER — Telehealth: Payer: Self-pay | Admitting: *Deleted

## 2013-01-18 ENCOUNTER — Telehealth: Payer: Self-pay | Admitting: Oncology

## 2013-01-18 ENCOUNTER — Inpatient Hospital Stay: Payer: 59

## 2013-01-18 ENCOUNTER — Other Ambulatory Visit: Payer: 59 | Admitting: Lab

## 2013-01-18 ENCOUNTER — Ambulatory Visit: Payer: 59 | Admitting: Oncology

## 2013-01-18 NOTE — Telephone Encounter (Signed)
added appt..per Kenney Houseman pt already aware

## 2013-01-18 NOTE — Telephone Encounter (Signed)
Phone call to patient since she was a "no show".  She stated she called and spoke with a scheduler this morning requesting to switch appointments to tomorrow since she is not feeling well.   MD was notified.  Scheduler to re-schedule per MD recommendations.

## 2013-01-19 ENCOUNTER — Ambulatory Visit (HOSPITAL_BASED_OUTPATIENT_CLINIC_OR_DEPARTMENT_OTHER): Payer: 59 | Admitting: Nurse Practitioner

## 2013-01-19 ENCOUNTER — Telehealth: Payer: Self-pay | Admitting: Oncology

## 2013-01-19 ENCOUNTER — Other Ambulatory Visit (HOSPITAL_BASED_OUTPATIENT_CLINIC_OR_DEPARTMENT_OTHER): Payer: 59 | Admitting: Lab

## 2013-01-19 ENCOUNTER — Ambulatory Visit: Payer: 59 | Admitting: Gastroenterology

## 2013-01-19 VITALS — BP 140/95 | HR 75 | Temp 97.5°F | Resp 18 | Ht 67.0 in | Wt 184.4 lb

## 2013-01-19 DIAGNOSIS — C187 Malignant neoplasm of sigmoid colon: Secondary | ICD-10-CM

## 2013-01-19 DIAGNOSIS — C189 Malignant neoplasm of colon, unspecified: Secondary | ICD-10-CM

## 2013-01-19 DIAGNOSIS — K5731 Diverticulosis of large intestine without perforation or abscess with bleeding: Secondary | ICD-10-CM

## 2013-01-19 DIAGNOSIS — M25559 Pain in unspecified hip: Secondary | ICD-10-CM

## 2013-01-19 DIAGNOSIS — F329 Major depressive disorder, single episode, unspecified: Secondary | ICD-10-CM

## 2013-01-19 DIAGNOSIS — K5733 Diverticulitis of large intestine without perforation or abscess with bleeding: Secondary | ICD-10-CM

## 2013-01-19 DIAGNOSIS — I10 Essential (primary) hypertension: Secondary | ICD-10-CM

## 2013-01-19 LAB — CBC WITH DIFFERENTIAL/PLATELET
Basophils Absolute: 0.1 10*3/uL (ref 0.0–0.1)
Eosinophils Absolute: 0.5 10*3/uL (ref 0.0–0.5)
HCT: 33 % — ABNORMAL LOW (ref 34.8–46.6)
HGB: 11.3 g/dL — ABNORMAL LOW (ref 11.6–15.9)
MCH: 31.5 pg (ref 25.1–34.0)
MCV: 91.6 fL (ref 79.5–101.0)
MONO%: 12 % (ref 0.0–14.0)
NEUT#: 2.1 10*3/uL (ref 1.5–6.5)
NEUT%: 59.1 % (ref 38.4–76.8)
RDW: 16.2 % — ABNORMAL HIGH (ref 11.2–14.5)
lymph#: 0.5 10*3/uL — ABNORMAL LOW (ref 0.9–3.3)

## 2013-01-19 LAB — COMPREHENSIVE METABOLIC PANEL (CC13)
Albumin: 3.4 g/dL — ABNORMAL LOW (ref 3.5–5.0)
BUN: 14 mg/dL (ref 7.0–26.0)
CO2: 23 mEq/L (ref 22–29)
Calcium: 9 mg/dL (ref 8.4–10.4)
Chloride: 109 mEq/L (ref 98–109)
Creatinine: 0.8 mg/dL (ref 0.6–1.1)
Glucose: 99 mg/dl (ref 70–140)
Potassium: 3.3 mEq/L — ABNORMAL LOW (ref 3.5–5.1)

## 2013-01-19 MED ORDER — OXYCODONE-ACETAMINOPHEN 5-325 MG PO TABS: 1.0000 | ORAL_TABLET | Freq: Three times a day (TID) | ORAL | Status: DC | PRN

## 2013-01-19 NOTE — Progress Notes (Addendum)
OFFICE PROGRESS NOTE  Interval history:  Sheri Calderon returns for followup of colon cancer. She was last treated with 5-fluorouracil on 01/05/2013. She was scheduled for the final cycle yesterday. She canceled the appointment.  She reports she is relocating to [place] and will be leaving on 01/21/2013. Her husband has contacted [facility] to arrange for followup. She reports persistent pain at the left hip. She is taking Percocet typically 2 times a day. She has a good appetite. She is intermittently nauseated. No vomiting. Bowels are moving regularly. Abdominal pain is better. She recently noted fullness at the left antecubital region. She denies arm pain or swelling.   Objective: Blood pressure 150/89, pulse 75, temperature 97.5 F (36.4 C), temperature source Oral, resp. rate 18, height 5' 7"  (1.702 m), weight 184 lb 6.4 oz (83.643 kg).  Oropharynx is without thrush or ulceration. Lungs are clear. Regular cardiac rhythm. Left chest Port-A-Cath has been removed. Incision site is without erythema. Right arm PICC site is nontender and without erythema. Abdomen soft and nontender. No hepatomegaly. Extremities without edema. Specifically no left arm edema. No mass at the left antecubital region. Calves soft and nontender.  Lab Results: Lab Results  Component Value Date   WBC 5.2 01/05/2013   HGB 12.5 01/05/2013   HCT 37.2 01/05/2013   MCV 91.6 01/05/2013   PLT 214 01/05/2013    Chemistry:    Chemistry      Component Value Date/Time   NA 139 01/05/2013 0812   NA 137 07/03/2012 0625   K 3.8 01/05/2013 0812   K 3.4* 07/03/2012 0625   CL 105 08/11/2012 0941   CL 102 07/03/2012 0625   CO2 24 01/05/2013 0812   CO2 29 07/03/2012 0625   BUN 16.2 01/05/2013 0812   BUN <3* 07/03/2012 0625   CREATININE 0.9 01/05/2013 0812   CREATININE 0.68 07/03/2012 0625      Component Value Date/Time   CALCIUM 9.6 01/05/2013 0812   CALCIUM 8.7 07/03/2012 0625   ALKPHOS 66 01/05/2013 0812   ALKPHOS 63 06/07/2012 1938   AST 20  01/05/2013 0812   AST 10 06/07/2012 1938   ALT 18 01/05/2013 0812   ALT 9 06/07/2012 1938   BILITOT 0.43 01/05/2013 0812   BILITOT 0.3 06/07/2012 1938       Studies/Results: Dg Hip Complete Left  12/28/2012   CLINICAL DATA:  Left hip pain, no known injury  EXAM: LEFT HIP - COMPLETE 2+ VIEW  COMPARISON:  None.  FINDINGS: Three views of left hip submitted. No acute fracture or subluxation. Bilateral hip joints are symmetrical in appearance. No pathologic calcifications are noted.  IMPRESSION: Negative.   Electronically Signed   By: Lahoma Crocker M.D.   On: 12/28/2012 17:24   Mr Pelvis W Wo Contrast  12/30/2012   CLINICAL DATA:  Hip pain.  Colorectal cancer and stomach cancer.  EXAM: MRI PELVIS WITHOUT AND WITH CONTRAST  TECHNIQUE: Multiplanar multisequence MR imaging of the pelvis was performed both before and after administration of intravenous contrast.  CONTRAST:  40m MULTIHANCE GADOBENATE DIMEGLUMINE 529 MG/ML IV SOLN  COMPARISON:  CT scan 10/20/2012.  FINDINGS: The hips are normally located. No hip fracture or bone lesion. There are mild symmetric hip joint degenerative changes but no stress fracture or AVN. No hip joint effusion. The pubic symphysis and SI joints are intact. No pelvic fracture or bone lesion.  The surrounding hip and pelvic musculature appears normal. No muscle tear, myositis or significant fatty atrophy. The lower  lumbar vertebral bodies appear normal.  No significant intrapelvic abnormalities are demonstrated. A small amount of free pelvic fluid is noted.  IMPRESSION: No acute or significant bony findings. Specifically, no findings to suggest metastatic disease involving the pelvis or hips   Electronically Signed   By: Kalman Jewels M.D.   On: 12/30/2012 15:52   Ir Fluoro Guide Cv Line Right  01/05/2013   EXAM: ULTRASOUND AND FLUOROSCOPIC GUIDED PLACEMENT OF PERIPHERALLY INSERTED CENTRAL VENOUS CATHETER  MEDICATIONS: None.  TECHNIQUE: The procedure, risks, benefits, and alternatives  were explained to the patient and informed written consent was obtained. A timeout was performed prior to the initiation of the procedure.  The right upper extremity was prepped with chlorhexidine in a sterile fashion, and a sterile drape was applied covering the operative field. Maximum barrier sterile technique with sterile gowns and gloves were used for the procedure. A timeout was performed prior to the initiation of the procedure. Local anesthesia was provided with 1% lidocaine.  Under direct ultrasound guidance, the right basilic vein was accessed with a micropuncture kit after the overlying soft tissues were anesthetized with 1% lidocaine. An ultrasound image was saved for documentation purposes. A guidewire was advanced to the level of the superior caval-atrial junction for measurement purposes and the PICC line was cut to length. A peel-away sheath was placed and a 36 cm, 5 [place], single lumen was inserted to level of the superior caval-atrial junction. A post procedure spot fluoroscopic was obtained. The catheter easily aspirated and flushed and was sutured in place. A dressing was placed. The patient tolerated the procedure well without immediate post procedural complication.  CONTRAST:  None  FLUOROSCOPY TIME:  30 seconds  COMPLICATIONS: None immediate  FINDINGS: After catheter placement, the tip lies within the superior cavoatrial junction. The catheter aspirates and flushes normally and is ready for immediate use.  IMPRESSION: Successful ultrasound and fluoroscopic guided placement of a right basilic vein approach, 36 cm, 5 [place], single lumen PICC with tip at the superior caval-atrial junction. The PICC line is ready for immediate use.  INDICATION: Colon cancer and Port-A-Cath will no longer aspirate. Patient needs PICC line to complete treatment.   Electronically Signed   By: Markus Daft M.D.   On: 01/05/2013 16:58   Ir Removal Tun Access W/ Port W/o Fl  01/10/2013   CLINICAL DATA:  History of  colon cancer, now with malfunctioning left anterior chest wall subclavian vein approach port a catheter. Patient has undergone recent right upper extremity approach PICC line placement for completion of chemotherapy. Please remove malfunctioning Port a Catheter.  EXAM: REMOVAL OF IMPLANTED TUNNELED PORT-A-CATH  MEDICATIONS: Ancef 2 gm IV; The antibiotic was administered within 1 hour prior to the start of the procedure.  ANESTHESIA/SEDATION: Fentanyl 200 mcg IV; Versed 55m IV  Sedation time: 20  FLUOROSCOPY TIME:  None  PROCEDURE: Informed written consent was obtained from the patient after a discussion of the risk, benefits and alternatives to the procedure. The patient was positioned supine on the fluoroscopy table and the left chest Port-A-Cath site was prepped with chlorhexidine. A sterile gown and gloves were worn during the procedure. Local anesthesia was provided with 1% lidocaine with epinephrine. A timeout was performed prior to the initiation of the procedure.  An incision was made overlying the Port-A-Cath with a #15 scalpel. Utilizing sharp and blunt dissection, the Port-A-Cath was removed completely. The pocked was irrigated with sterile saline. Wound closure was performed with subcutaneous 3-0 Monocryl, subcuticular 4-0  Vicryl and Dermabond. A dressing was placed. The patient tolerated the procedure well without immediate post procedural complication.  FINDINGS: Successful removal of implant Port-A-Cath without immediate post procedural complication.  IMPRESSION: Successful removal of implanted Port-A-Cath.   Electronically Signed   By: Sandi Mariscal M.D.   On: 01/10/2013 11:33   Ir Cv Line Injection  01/05/2013   CLINICAL DATA:  47 year old with colon cancer. Port-A-Cath is not aspirating.  EXAM: PORT-A-CATH INJECTION WITH FLUOROSCOPY  Physician: Stephan Minister. Henn, MD  MEDICATIONS: None  ANESTHESIA/SEDATION: Moderate sedation time: None  FLUOROSCOPY TIME:  12 seconds  PROCEDURE: The left chest Port-A-Cath  was already access when the patient arrived in Radiology. Patient was placed on the fluoroscopic table. Contrast was injected through the Port-A-Cath under fluoroscopy. Catheter was flushed at the end of the procedure.  COMPLICATIONS: None  FINDINGS: Left subclavian Port-A-Cath tip is in the upper SVC. The tip appears to be against the vein wall and there is probably a fibrin sheath at the catheter tip. Mild narrowing of the upper SVC near the catheter tip. Lower SVC is patent. Port-A-Cath is intact without catheter discontinuity or contrast extravasation. The Port-A-Cath would not aspirate.  IMPRESSION: Port-A-Cath tip in the upper SVC with a small fibrin sheath.   Electronically Signed   By: Markus Daft M.D.   On: 01/05/2013 16:54   Ir US Guide Vasc Access Right  01/05/2013   EXAM: ULTRASOUND AND FLUOROSCOPIC GUIDED PLACEMENT OF PERIPHERALLY INSERTED CENTRAL VENOUS CATHETER  MEDICATIONS: None.  TECHNIQUE: The procedure, risks, benefits, and alternatives were explained to the patient and informed written consent was obtained. A timeout was performed prior to the initiation of the procedure.  The right upper extremity was prepped with chlorhexidine in a sterile fashion, and a sterile drape was applied covering the operative field. Maximum barrier sterile technique with sterile gowns and gloves were used for the procedure. A timeout was performed prior to the initiation of the procedure. Local anesthesia was provided with 1% lidocaine.  Under direct ultrasound guidance, the right basilic vein was accessed with a micropuncture kit after the overlying soft tissues were anesthetized with 1% lidocaine. An ultrasound image was saved for documentation purposes. A guidewire was advanced to the level of the superior caval-atrial junction for measurement purposes and the PICC line was cut to length. A peel-away sheath was placed and a 36 cm, 5 [place], single lumen was inserted to level of the superior caval-atrial junction.  A post procedure spot fluoroscopic was obtained. The catheter easily aspirated and flushed and was sutured in place. A dressing was placed. The patient tolerated the procedure well without immediate post procedural complication.  CONTRAST:  None  FLUOROSCOPY TIME:  30 seconds  COMPLICATIONS: None immediate  FINDINGS: After catheter placement, the tip lies within the superior cavoatrial junction. The catheter aspirates and flushes normally and is ready for immediate use.  IMPRESSION: Successful ultrasound and fluoroscopic guided placement of a right basilic vein approach, 36 cm, 5 French, single lumen PICC with tip at the superior caval-atrial junction. The PICC line is ready for immediate use.  INDICATION: Colon cancer and Port-A-Cath will no longer aspirate. Patient needs PICC line to complete treatment.   Electronically Signed   By: Markus Daft M.D.   On: 01/05/2013 16:58    Medications: I have reviewed the patient's current medications.  Assessment/Plan:  1. 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 The  tumor is associated with a bowel perforation. Tumor focally involved a radial surgical margin. She completed cycle 1 adjuvant FOLFOX 07/28/2012; cycle 2 08/11/2012; cycle 3 08/25/2012; cycle 4 09/08/2012. She began Xeloda/radiation 09/26/2012, completed 11/08/2012, adjuvant FOLFOX resumed 11/24/2012. Oxaliplatin was discontinued from the chemotherapy regimen beginning on 12/22/2012. She was last treated on 01/05/2013. She decided against proceeding with the final cycle on 01/18/2013. 2. Bowel perforation/diverticulitis secondary to #1. 3. History of atrial fibrillation. 4. Family history of cancer. The sigmoid colon tumor 06/28/2012 was microsatellite stable and without loss of mismatch repair proteins. 5. Status post Port-A-Cath placement 07/11/2012. Port-A-Cath was removed on 01/10/2013 due to  malfunction. 6. Nausea and vomiting following cycle 1 FOLFOX. Aloxi was added with cycle 2.  7. Depression.  8. "Muscle aches" following chemotherapy. ? manifestation of oxaliplatin neuropathy.  9. History of Abdominal pain-this is most likely not related to chemotherapy. Improved.  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 Not interfering with activity at present. 11. "Hot flashes". Question menopausal symptom. 12. Pruritus and erythema over the palms during the oxaliplatin infusion on 12/08/2012. Likely allergic reaction. Oxaliplatin was discontinued beginning with chemotherapy on 11/22/2012. 13. Persistent pain at the low back and "hips "-negative pelvic MRI 12/30/2012,-negative plain x-ray of the left hip 12/28/2012. She continues to have pain at the left hip and takes Percocet as needed. 14. Hypertension-her blood pressure has been elevated on multiple office visits here. I recommended she obtain a primary physician for management of hypertension. 15. PICC placement 01/05/2013. The PICC line will be removed today.  Disposition-Sheri Calderon is relocating to [place] in the near future. She has decided against proceeding with the final cycle of chemotherapy. She requests removal of the PICC line. This will be done today. Prior to removal we will obtain a CBC, chemistry panel and CEA.  She is arranging for followup at [facility]. She would like to schedule a followup visit here in 6 months as she will be traveling "back and forth".  She continues to have left hip pain of unclear etiology. We gave her a new prescription for Percocet one tablet 3 times daily as needed with 30 tablets to be dispensed.  She will return for a followup visit in 6 months with a CEA.   Patient seen with Dr. Benay Spice.   Ned Card ANP/GNP-BC  This was a shared visit with Ned Card. Sheri Calderon plans to relocate to [place] within the next few days. She declines further  chemotherapy. The etiology of the "hip " pain remains unclear. She plans to have this evaluated at [facility] and with a neurologist in [place]. I told her that I will be available to discuss her case with the physicians there. She will return for an office visit here in 6 months.  Julieanne Manson, M.D.

## 2013-01-19 NOTE — Telephone Encounter (Signed)
per 11.19.14 LT verbal sched pt to see her at 9:15am with lab after. Marland Kitchen..i was advised verbally by LT not to r/s chemo due to pt stating to [facility] desk nurse that she was not getting tx and was supposed to be getting port taken out.

## 2013-01-19 NOTE — Progress Notes (Signed)
1030 Per MD orders, PICC removed with vaseline guaze, 4x4 gauze and pressure dressing over picc site. PICC @ 36 cm and intact at removal (see IV documentation). Patient laying flat for 30 minute post observation.

## 2013-01-20 LAB — CEA: CEA: 1.5 ng/mL (ref 0.0–5.0)

## 2013-02-14 ENCOUNTER — Telehealth: Payer: Self-pay | Admitting: *Deleted

## 2013-02-14 ENCOUNTER — Other Ambulatory Visit: Payer: Self-pay | Admitting: Oncology

## 2013-02-14 ENCOUNTER — Other Ambulatory Visit: Payer: Self-pay | Admitting: *Deleted

## 2013-02-14 DIAGNOSIS — C189 Malignant neoplasm of colon, unspecified: Secondary | ICD-10-CM

## 2013-02-14 NOTE — Telephone Encounter (Signed)
Spoke with patient by phone to follow-up with her.  She stated she is currently living in [place].  She stated she is seeing a Librarian, academic on 02/28/13 in [place], [place] at [facility] in [place].  She stated she has be seeing a primary care physician where she is now residing to maintain her primary care.  She stated she would like to get a follow-up appointment with Dr. Benay Spice for May when she returns back to [facility].  This RN sent a POF to scheduler for f/u appointment in May 2015.  This RN encouraged patient to call if she  returns to [facility] sooner and needs assist.  She thanked this Therapist, sports for the call.

## 2013-02-15 ENCOUNTER — Telehealth: Payer: Self-pay | Admitting: Oncology

## 2013-02-15 NOTE — Telephone Encounter (Signed)
s.w. pt and advised on May  2015 appt...pt ok and aware

## 2013-03-06 ENCOUNTER — Telehealth: Payer: Self-pay | Admitting: Radiation Oncology

## 2013-03-06 NOTE — Telephone Encounter (Signed)
Faxed records to Dr. Ebbie Ridge in [place].  OK per [facility].  Received confirmation.

## 2013-03-20 ENCOUNTER — Encounter (INDEPENDENT_AMBULATORY_CARE_PROVIDER_SITE_OTHER): Payer: Self-pay | Admitting: Surgery

## 2013-07-18 ENCOUNTER — Other Ambulatory Visit: Payer: 59

## 2013-07-18 ENCOUNTER — Ambulatory Visit: Payer: 59 | Admitting: Oncology

## 2013-11-15 ENCOUNTER — Telehealth: Payer: Self-pay | Admitting: Oncology

## 2013-11-15 NOTE — Telephone Encounter (Signed)
Pt cld to r/s from May 2015 out [place] state was requested to send updated information from her previous Dr. she was seeing to be faxed over to [place]...KJ

## 2013-11-23 ENCOUNTER — Ambulatory Visit: Payer: 59 | Admitting: Nurse Practitioner

## 2013-11-23 ENCOUNTER — Other Ambulatory Visit: Payer: 59

## 2013-11-23 ENCOUNTER — Telehealth: Payer: Self-pay | Admitting: Oncology

## 2013-11-23 NOTE — Telephone Encounter (Signed)
pt called to cx and will call back to r/s

## 2013-12-15 ENCOUNTER — Other Ambulatory Visit: Payer: Self-pay

## 2014-01-04 ENCOUNTER — Telehealth: Payer: Self-pay | Admitting: Nurse Practitioner

## 2014-01-04 NOTE — Telephone Encounter (Signed)
Pt called to r/s 09/24 labs/ov, pt confirmed new schedule.... KJ

## 2014-01-09 ENCOUNTER — Encounter: Payer: Self-pay | Admitting: *Deleted

## 2014-01-09 ENCOUNTER — Ambulatory Visit: Payer: 59 | Admitting: Nurse Practitioner

## 2014-01-09 ENCOUNTER — Other Ambulatory Visit: Payer: 59

## 2014-01-09 ENCOUNTER — Telehealth: Payer: Self-pay | Admitting: Nurse Practitioner

## 2014-01-09 NOTE — Telephone Encounter (Signed)
Pt called to cancel all visits states she is seeing someone else sent msg to MD/ML advising of transfer..... KJ

## 2014-01-10 ENCOUNTER — Encounter: Payer: Self-pay | Admitting: Hematology & Oncology

## 2014-01-10 ENCOUNTER — Telehealth: Payer: Self-pay | Admitting: Hematology & Oncology

## 2014-01-10 ENCOUNTER — Encounter: Payer: Self-pay | Admitting: Internal Medicine

## 2014-01-10 NOTE — Telephone Encounter (Signed)
Pt aware of 11-17 appointment

## 2014-01-10 NOTE — Telephone Encounter (Signed)
Left pt message to call for appointment

## 2014-01-15 ENCOUNTER — Other Ambulatory Visit: Payer: Self-pay | Admitting: *Deleted

## 2014-01-15 DIAGNOSIS — C187 Malignant neoplasm of sigmoid colon: Secondary | ICD-10-CM

## 2014-01-16 ENCOUNTER — Encounter: Payer: Self-pay | Admitting: Family

## 2014-01-16 ENCOUNTER — Ambulatory Visit (HOSPITAL_BASED_OUTPATIENT_CLINIC_OR_DEPARTMENT_OTHER): Payer: 59 | Admitting: Family

## 2014-01-16 ENCOUNTER — Other Ambulatory Visit (HOSPITAL_BASED_OUTPATIENT_CLINIC_OR_DEPARTMENT_OTHER): Payer: 59 | Admitting: Lab

## 2014-01-16 DIAGNOSIS — C187 Malignant neoplasm of sigmoid colon: Secondary | ICD-10-CM

## 2014-01-16 LAB — CBC WITH DIFFERENTIAL (CANCER CENTER ONLY)
BASO#: 0 10*3/uL (ref 0.0–0.2)
BASO%: 0.3 % (ref 0.0–2.0)
EOS%: 3.1 % (ref 0.0–7.0)
Eosinophils Absolute: 0.2 10*3/uL (ref 0.0–0.5)
HEMATOCRIT: 35.9 % (ref 34.8–46.6)
HEMOGLOBIN: 11.6 g/dL (ref 11.6–15.9)
LYMPH#: 0.9 10*3/uL (ref 0.9–3.3)
LYMPH%: 12.2 % — AB (ref 14.0–48.0)
MCH: 29.9 pg (ref 26.0–34.0)
MCHC: 32.3 g/dL (ref 32.0–36.0)
MCV: 93 fL (ref 81–101)
MONO#: 0.7 10*3/uL (ref 0.1–0.9)
MONO%: 9.4 % (ref 0.0–13.0)
NEUT#: 5.6 10*3/uL (ref 1.5–6.5)
NEUT%: 75 % (ref 39.6–80.0)
Platelets: 328 10*3/uL (ref 145–400)
RBC: 3.88 10*6/uL (ref 3.70–5.32)
RDW: 12.5 % (ref 11.1–15.7)
WBC: 7.4 10*3/uL (ref 3.9–10.0)

## 2014-01-16 MED ORDER — METHYLPREDNISOLONE (PAK) 4 MG PO TABS: ORAL_TABLET | ORAL | Status: DC

## 2014-01-16 NOTE — Progress Notes (Signed)
[facility]  [phone] [phone]  ID: MISHKA STEGEMANN OB: 1966/02/15 MR#: [id number] EHO#:[id number] Patient Care Team: No Pcp Per Patient as PCP - General (General Practice)  DIAGNOSIS: Stage IIIc (T4 N2) moderately differentiated adenocarcinoma of the sigmoid colon, positive for K-ras mutation, status post sigmoid colectomy 06/28/2012.  INTERVAL HISTORY: Ms. Casavant is here today for a follow-up. She recently moved back to [place] after living in [place] for the last year. She was being followed by Cardiology there as well. She was a patient of Dr. Gearldine Shown before moving to [place].  She was diagnosed with stage IIIc (T4 N2) moderately differentiated adenocarcinoma of the sigmoid colon, positive for K-ras mutation in 2014. She had a sigmoid colectomy on 06/28/2012. The tumor was associated with a bowel perforation. She She was treated with FOLFOX for 4 cycles and with Xeloda and radiation for 6 weeks and then with FOLFOX again. Her last treatment was on 01/04/2013. On 02/18/2013, while in [place], she was told by her oncologist that she was in remission.   Recent abdominal CT while she was still in [place] showed no evidence of recurrance.  Recent MRI of spine showed degenerative disc/joint disease.   She has neuropathy in her feet. She has been taking B12 for this. I told her to stop the B12 and start taking B6 daily instead.  She is a smoker and has emphysema. This causes her to be SOB at times with exertion.  She was working out in her yard last week and got poison ivy on her arm. We will give her a medrol dose pack and hopefully this will help clear it help.  She denies fever, chills, n/v, cough, rash, headache, dizziness, chest pain, palpitations, constipation, diarrhea, blood in urine or stool. She has had abdominal pain that comes and goes for the last year or so.  No swelling or tenderness in her extremities.  Her appetite is good and she is drinking plenty of  fluids.  Overall, she seems to be doing quite well.    CURRENT TREATMENT: Observation  REVIEW OF SYSTEMS: All other 10 point review of systems is negative.   PAST MEDICAL HISTORY: Past Medical History  Diagnosis Date  . Lower extremity edema   . Restless legs syndrome   . Diverticulitis     "this is my 2nd time in hospital w/this in the last 2 wk" (06/21/2012)  . Atrial fibrillation     resolved - does not see cardiologist  . Colon cancer 2014    cancer of the sigmoid colon  . S/P chemotherapy, time since less than 4 weeks     PAST SURGICAL HISTORY: Past Surgical History  Procedure Laterality Date  . Tubal ligation  ~ 1989  . Partial colectomy N/A 06/28/2012    Procedure: PARTIAL COLECTOMY;  Surgeon: Harl Bowie, MD;  Location: [facility];  Service: General;  Laterality: N/A;  . Portacath placement N/A 07/11/2012    Procedure: INSERTION PORT-A-CATH;  Surgeon: Harl Bowie, MD;  Location: [facility];  Service: General;  Laterality: N/A;    FAMILY HISTORY Family History  Problem Relation Age of Onset  . Diabetes Mother   . Heart disease Mother   . Stomach cancer Mother   . Cancer Mother 64    ovarian cancer  . COPD Father   . Hypertension Father   . Hyperlipidemia Father   . Colon polyps Father     62 lifetime colon polyps  . Down syndrome Paternal Aunt   .  Cancer Paternal Uncle     cancer in the spine  . COPD Maternal Grandmother   . Stomach cancer Paternal Grandmother     GYNECOLOGIC HISTORY:  No LMP recorded. Patient is not currently having periods (Reason: Chemotherapy).   SOCIAL HISTORY: History   Social History  . Marital Status: Married    Spouse Name: Sherren Mocha    Number of Children: 1  . Years of Education: N/A   Occupational History  .      quality Radio producer   Social History Main Topics  . Smoking status: Former Smoker -- 1.00 packs/day for 15 years    Types: Cigarettes    Start date: 01/17/1991    Quit date: 06/07/2012  . Smokeless  tobacco: Never Used     Comment: QUIT 2 YEARS AGO  . Alcohol Use: No  . Drug Use: No  . Sexual Activity: Yes    Birth Control/ Protection: Surgical   Other Topics Concern  . 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 Smoking status: Former Smoker -- 1.00 packs/day for 15 years    Types: Cigarettes    Start date: 01/17/1991    Quit date: 06/07/2012  . Smokeless tobacco: Never Used     Comment: QUIT 2 YEARS AGO  . Alcohol Use: No   Colonoscopy: PAP: Bone density: Lipid panel:  Allergies  Allergen Reactions  . Lorazepam Hives and Swelling    Patient reports receiving lorazepam intensol in the hospital and experienced swelling with hives.    Current Outpatient Prescriptions  Medication Sig Dispense Refill  . ALPRAZolam (XANAX) 0.5 MG tablet TAKE 1 TABLET BY MOUTH TWICE A DAY AS NEEDED FOR ANXIETY. 60 tablet 0  . DULoxetine (CYMBALTA) 30 MG capsule Take 30 mg by mouth daily.    . Multiple Vitamins-Minerals (QC WOMENS DAILY MULTIVITAMIN PO) Take by mouth every morning.    Marland Kitchen omeprazole (PRILOSEC) 20 MG capsule Take 20 mg by mouth daily.    . ondansetron (ZOFRAN) 4 MG tablet Take 4 mg by mouth 2 (two) times daily.    Marland Kitchen oxyCODONE-acetaminophen (PERCOCET/ROXICET) 5-325 MG per tablet Take 1 tablet by mouth 3 (three) times daily as needed. 30 tablet 0  . spironolactone (ALDACTONE) 25 MG tablet Take 25 mg by mouth daily.    . vitamin B-12 (CYANOCOBALAMIN) 1000 MCG tablet Take 1,000 mcg by mouth daily.    . Vitamin D, Cholecalciferol, 400 UNITS CHEW Chew by mouth every morning.     No current facility-administered medications for this visit.   Facility-Administered Medications Ordered in Other Visits  Medication Dose Route Frequency Provider  Last Rate Last Dose  . sodium chloride 0.9 % injection 10 mL  10 mL Intracatheter PRN Ladell Pier, MD       OBJECTIVE: Filed Vitals:   01/16/14 1023  BP: 126/79  Pulse: 105  Temp: 98.4 F (36.9 C)  Resp: 14    Filed Weights   01/16/14 1023  Weight: 204 lb (92.534 kg)   ECOG FS:0 - Asymptomatic Ocular: Sclerae unicteric, pupils equal, round and reactive to light Ear-nose-throat: Oropharynx clear, dentition fair Lymphatic: No cervical or supraclavicular adenopathy Lungs no rales or rhonchi, good excursion bilaterally Heart regular rate and rhythm, no murmur appreciated  Abd soft, nontender, positive bowel sounds MSK no focal spinal tenderness, no joint edema Neuro: non-focal, well-oriented, appropriate affect Breasts: Deferred  LAB RESULTS: CMP     Component Value Date/Time   NA 142 01/19/2013 1009   NA 137 07/03/2012 0625   K 3.3* 01/19/2013 1009   K 3.4* 07/03/2012 0625   CL 105 08/11/2012 0941   CL 102 07/03/2012 0625   CO2 23 01/19/2013 1009   CO2 29 07/03/2012 0625   GLUCOSE 99 01/19/2013 1009   GLUCOSE 95 08/11/2012 0941   GLUCOSE 107* 07/03/2012 0625   BUN 14.0 01/19/2013 1009   BUN <3* 07/03/2012 0625   CREATININE 0.8 01/19/2013 1009   CREATININE 0.68 07/03/2012 0625   CALCIUM 9.0 01/19/2013 1009   CALCIUM 8.7 07/03/2012 0625   PROT 6.5 01/19/2013 1009   PROT 7.3 06/07/2012 1938   ALBUMIN 3.4* 01/19/2013 1009   ALBUMIN 3.5 06/07/2012 1938   AST 18 01/19/2013 1009   AST 10 06/07/2012 1938   ALT 15 01/19/2013 1009   ALT 9 06/07/2012 1938   ALKPHOS 66 01/19/2013 1009   ALKPHOS 63 06/07/2012 1938   BILITOT 0.27 01/19/2013 1009   BILITOT 0.3 06/07/2012 1938   GFRNONAA >90 07/03/2012 0625   GFRAA >90 07/03/2012 0625   INo results found for: SPEP, UPEP Lab Results  Component Value Date   WBC 7.4 01/16/2014   NEUTROABS 5.6 01/16/2014   HGB 11.6 01/16/2014   HCT 35.9 01/16/2014   MCV 93 01/16/2014   PLT 328 01/16/2014   No results found for:  LABCA2 No components found for: IWOEH212 No results for input(s): INR in the last 168 hours.  STUDIES: No results found.  ASSESSMENT/PLAN: Ms. Goldberg is a very pleasant 48 yo female with history of stage IIIc (T4 N2) moderately differentiated adenocarcinoma of the sigmoid colon, positive for K-ras mutation, status post sigmoid colectomy 06/28/2012. She completed chemo and radiation in November 2014. She was seeing Dr. Benay Spice before moving to [place]. She was followed by oncology there for the last year. No that she has moved back she has asked that we follow her because our location is closer to her home.  She has had no evidence of recurrence. Her main issue at this time is her neuropathy.  She will try the B6 and see if that helps her.  Her CBC today was normal.  She will complete a medrol dose pack for her poison ivy.  We will see her back in 3 months for labs and follow-up.  She knows to call here with any questions or concerns and to go to the ED in the event of an emergency. We can certainly see her sooner if need be.   Eliezer Bottom, NP 01/16/2014 10:57 AM

## 2014-03-12 ENCOUNTER — Ambulatory Visit: Payer: 59 | Admitting: Internal Medicine

## 2014-04-16 ENCOUNTER — Telehealth: Payer: Self-pay | Admitting: Hematology & Oncology

## 2014-04-16 ENCOUNTER — Other Ambulatory Visit: Payer: 59 | Admitting: Lab

## 2014-04-16 ENCOUNTER — Ambulatory Visit: Payer: 59 | Admitting: Hematology & Oncology

## 2014-04-16 NOTE — Telephone Encounter (Signed)
Pt moved 2-15 to 3-25 she is sick and the weather

## 2014-04-24 ENCOUNTER — Encounter: Payer: Self-pay | Admitting: Internal Medicine

## 2014-04-26 ENCOUNTER — Ambulatory Visit: Payer: Self-pay | Admitting: Internal Medicine

## 2014-05-25 ENCOUNTER — Encounter: Payer: Self-pay | Admitting: Hematology & Oncology

## 2014-05-25 ENCOUNTER — Ambulatory Visit (HOSPITAL_BASED_OUTPATIENT_CLINIC_OR_DEPARTMENT_OTHER): Payer: 59 | Admitting: Hematology & Oncology

## 2014-05-25 ENCOUNTER — Other Ambulatory Visit (HOSPITAL_BASED_OUTPATIENT_CLINIC_OR_DEPARTMENT_OTHER): Payer: 59

## 2014-05-25 VITALS — BP 116/78 | HR 75 | Temp 97.9°F | Resp 14 | Ht 67.0 in | Wt 199.0 lb

## 2014-05-25 DIAGNOSIS — G63 Polyneuropathy in diseases classified elsewhere: Principal | ICD-10-CM

## 2014-05-25 DIAGNOSIS — Z85038 Personal history of other malignant neoplasm of large intestine: Secondary | ICD-10-CM | POA: Diagnosis not present

## 2014-05-25 DIAGNOSIS — C187 Malignant neoplasm of sigmoid colon: Secondary | ICD-10-CM

## 2014-05-25 DIAGNOSIS — C801 Malignant (primary) neoplasm, unspecified: Secondary | ICD-10-CM

## 2014-05-25 LAB — CBC WITH DIFFERENTIAL (CANCER CENTER ONLY)
BASO#: 0 10*3/uL (ref 0.0–0.2)
BASO%: 0.7 % (ref 0.0–2.0)
EOS ABS: 0.3 10*3/uL (ref 0.0–0.5)
EOS%: 5.3 % (ref 0.0–7.0)
HCT: 37.5 % (ref 34.8–46.6)
HGB: 12.1 g/dL (ref 11.6–15.9)
LYMPH#: 1.4 10*3/uL (ref 0.9–3.3)
LYMPH%: 25 % (ref 14.0–48.0)
MCH: 29.4 pg (ref 26.0–34.0)
MCHC: 32.3 g/dL (ref 32.0–36.0)
MCV: 91 fL (ref 81–101)
MONO#: 0.7 10*3/uL (ref 0.1–0.9)
MONO%: 11.8 % (ref 0.0–13.0)
NEUT#: 3.2 10*3/uL (ref 1.5–6.5)
NEUT%: 57.2 % (ref 39.6–80.0)
PLATELETS: 358 10*3/uL (ref 145–400)
RBC: 4.11 10*6/uL (ref 3.70–5.32)
RDW: 13.4 % (ref 11.1–15.7)
WBC: 5.5 10*3/uL (ref 3.9–10.0)

## 2014-05-25 LAB — COMPREHENSIVE METABOLIC PANEL
ALT: 17 U/L (ref 0–35)
AST: 15 U/L (ref 0–37)
Albumin: 4.1 g/dL (ref 3.5–5.2)
Alkaline Phosphatase: 60 U/L (ref 39–117)
BILIRUBIN TOTAL: 0.3 mg/dL (ref 0.2–1.2)
BUN: 12 mg/dL (ref 6–23)
CALCIUM: 9.3 mg/dL (ref 8.4–10.5)
CO2: 27 meq/L (ref 19–32)
CREATININE: 0.89 mg/dL (ref 0.50–1.10)
Chloride: 99 mEq/L (ref 96–112)
GLUCOSE: 109 mg/dL — AB (ref 70–99)
Potassium: 4.5 mEq/L (ref 3.5–5.3)
SODIUM: 137 meq/L (ref 135–145)
TOTAL PROTEIN: 6.8 g/dL (ref 6.0–8.3)

## 2014-05-25 MED ORDER — VITAMIN B-6 100 MG PO TABS: ORAL_TABLET | ORAL | Status: DC

## 2014-05-25 NOTE — Progress Notes (Signed)
Hematology and Oncology Follow Up Visit  Sheri Calderon [id number] [birth year] 49 y.o. 05/25/2014   Principle Diagnosis:   Stage IIC (T4N2M0) adenocarcinoma of the sigmoid colon --KRAS +  Current Therapy:    Observattion     Interim History:  Sheri Calderon is back for follow-up. We first saw her in November. Since then, she's been doing okay. She is living down in [place]. She probably will be going to either [place] or [place] with her husband's job.  She really has had no complaints. She's had no abdominal pain. His been no problem with bowels or bladder. She's had no cough. She had no shortness of breath. She's had no leg swelling. His been no rashes.  She has had no nausea or vomiting.  This been no change in her medications.  Medications:  Current outpatient prescriptions:  .  ALPRAZolam (XANAX) 0.5 MG tablet, TAKE 1 TABLET BY MOUTH TWICE A DAY AS NEEDED FOR ANXIETY., Disp: 60 tablet, Rfl: 0 .  DULoxetine (CYMBALTA) 30 MG capsule, Take 30 mg by mouth daily., Disp: , Rfl:  .  HYDROcodone-acetaminophen (NORCO/VICODIN) 5-325 MG per tablet, Take 1 tablet by mouth every 6 (six) hours as needed for moderate pain., Disp: , Rfl:  .  omeprazole (PRILOSEC) 20 MG capsule, Take 20 mg by mouth daily., Disp: , Rfl:  .  ondansetron (ZOFRAN) 4 MG tablet, Take 4 mg by mouth 2 (two) times daily., Disp: , Rfl:  .  spironolactone (ALDACTONE) 25 MG tablet, Take 25 mg by mouth daily., Disp: , Rfl:  .  vitamin B-12 (CYANOCOBALAMIN) 1000 MCG tablet, Take 1,000 mcg by mouth daily., Disp: , Rfl:  .  pyridOXINE (VITAMIN B-6) 100 MG tablet, Take 2 pills a day., Disp: 60 tablet, Rfl: 12 No current facility-administered medications for this visit.  Facility-Administered Medications Ordered in Other Visits:  .  sodium chloride 0.9 % injection 10 mL, 10 mL, Intracatheter, PRN, Ladell Pier, MD  Allergies:  Allergies  Allergen Reactions  . Lorazepam Hives and Swelling    Patient reports receiving  lorazepam intensol in the hospital and experienced swelling with hives.    Past Medical History, Surgical history, Social history, and Family History were reviewed and updated.  Review of Systems: As above  Physical Exam:  height is _0  (1.702 m) and weight is 199 lb (90.266 kg). Her oral temperature is 97.9 F (36.6 C). Her blood pressure is 116/78 and her pulse is 75. Her respiration is 14.   Wt Readings from Last 3 Encounters:  05/25/14 199 lb (90.266 kg)  01/16/14 204 lb (92.534 kg)  01/19/13 184 lb 6.4 oz (83.643 kg)     Well-developed and well-nourished white female. Head and neck exam shows no ocular or oral lesions. There are no palpable cervical or supraclavicular lymph nodes. Lungs are clear. Cardiac exam regular rate and rhythm with no murmurs, rubs or bruits. Abdomen is soft. There are good bowel sounds. She has well-healed laparotomy wound. There is no fluid wave. There is no palpable liver or spleen tip. Back exam shows no tenderness over the spine, ribs or hips. Extremities shows no clubbing, cyanosis or edema. Skin exam no rashes, ecchymoses or petechia.  Lab Results  Component Value Date   WBC 5.5 05/25/2014   HGB 12.1 05/25/2014   HCT 37.5 05/25/2014   MCV 91 05/25/2014   PLT 358 05/25/2014     Chemistry      Component Value Date/Time   NA 137 05/25/2014 8676  NA 142 01/19/2013 1009   K 4.5 05/25/2014 0927   K 3.3* 01/19/2013 1009   CL 99 05/25/2014 0927   CL 105 08/11/2012 0941   CO2 27 05/25/2014 0927   CO2 23 01/19/2013 1009   BUN 12 05/25/2014 0927   BUN 14.0 01/19/2013 1009   CREATININE 0.89 05/25/2014 0927   CREATININE 0.8 01/19/2013 1009      Component Value Date/Time   CALCIUM 9.3 05/25/2014 0927   CALCIUM 9.0 01/19/2013 1009   ALKPHOS 60 05/25/2014 0927   ALKPHOS 66 01/19/2013 1009   AST 15 05/25/2014 0927   AST 18 01/19/2013 1009   ALT 17 05/25/2014 0927   ALT 15 01/19/2013 1009   BILITOT 0.3 05/25/2014 0927   BILITOT 0.27  01/19/2013 1009         Impression and Plan: Sheri Calderon is 49 year old white female with stage IIIC colon cancer. She underwent a sigmoid colectomy back in April 2014. She then had adjuvant radiation and chemotherapy. She completed this in November 2014.  I don't see any evidence of recurrent disease. She does have other healthcare issues. She has pain issues from her back.   we will plan to get her back to see [place] in another 4 months.     Volanda Napoleon, MD 3/25/20164:00 PM

## 2014-06-18 ENCOUNTER — Ambulatory Visit: Payer: Self-pay | Admitting: Internal Medicine

## 2014-07-04 ENCOUNTER — Other Ambulatory Visit: Payer: Self-pay | Admitting: Oncology

## 2014-08-28 ENCOUNTER — Ambulatory Visit: Payer: Self-pay | Admitting: Internal Medicine

## 2014-09-10 ENCOUNTER — Other Ambulatory Visit: Payer: 59

## 2014-09-10 ENCOUNTER — Ambulatory Visit: Payer: 59 | Admitting: Hematology & Oncology

## 2014-09-10 ENCOUNTER — Ambulatory Visit: Payer: 59

## 2014-09-10 ENCOUNTER — Telehealth: Payer: Self-pay | Admitting: Hematology & Oncology

## 2014-09-10 NOTE — Telephone Encounter (Signed)
Pt called in wanted to rescheudule due to caring for father.  Rescheduled to 8/11

## 2014-10-08 ENCOUNTER — Telehealth: Payer: Self-pay | Admitting: *Deleted

## 2014-10-08 NOTE — Telephone Encounter (Signed)
Patient c/o back pain with a 'knot' she can feel on her spine. Since she has a cancer history she is worried and wants to know if she needs to be seen sooner than her appointment later this week on 10/11/2014. Spoke with Dr Marin Olp, who reviewed her chart, and feels like he can wait to assess and evaluate the patient during her regularly scheduled appointment in a few days. Relayed this to patient and she is okay with waiting to see Dr Marin Olp until Thursday.

## 2014-10-11 ENCOUNTER — Other Ambulatory Visit: Payer: 59

## 2014-10-11 ENCOUNTER — Ambulatory Visit: Payer: 59 | Admitting: Hematology & Oncology

## 2014-10-24 ENCOUNTER — Ambulatory Visit (HOSPITAL_BASED_OUTPATIENT_CLINIC_OR_DEPARTMENT_OTHER): Payer: 59 | Admitting: Hematology & Oncology

## 2014-10-24 ENCOUNTER — Encounter: Payer: Self-pay | Admitting: Hematology & Oncology

## 2014-10-24 ENCOUNTER — Other Ambulatory Visit (HOSPITAL_BASED_OUTPATIENT_CLINIC_OR_DEPARTMENT_OTHER): Payer: 59

## 2014-10-24 VITALS — BP 127/77 | HR 77 | Temp 97.5°F | Resp 18 | Ht 67.0 in | Wt 211.0 lb

## 2014-10-24 DIAGNOSIS — Z85038 Personal history of other malignant neoplasm of large intestine: Secondary | ICD-10-CM

## 2014-10-24 DIAGNOSIS — D171 Benign lipomatous neoplasm of skin and subcutaneous tissue of trunk: Secondary | ICD-10-CM | POA: Diagnosis not present

## 2014-10-24 DIAGNOSIS — R635 Abnormal weight gain: Secondary | ICD-10-CM

## 2014-10-24 DIAGNOSIS — C801 Malignant (primary) neoplasm, unspecified: Secondary | ICD-10-CM

## 2014-10-24 DIAGNOSIS — G63 Polyneuropathy in diseases classified elsewhere: Principal | ICD-10-CM

## 2014-10-24 LAB — CBC WITH DIFFERENTIAL (CANCER CENTER ONLY)
BASO#: 0 10*3/uL (ref 0.0–0.2)
BASO%: 0.5 % (ref 0.0–2.0)
EOS%: 3.7 % (ref 0.0–7.0)
Eosinophils Absolute: 0.2 10*3/uL (ref 0.0–0.5)
HEMATOCRIT: 36.2 % (ref 34.8–46.6)
HEMOGLOBIN: 12 g/dL (ref 11.6–15.9)
LYMPH#: 1.2 10*3/uL (ref 0.9–3.3)
LYMPH%: 22 % (ref 14.0–48.0)
MCH: 30.1 pg (ref 26.0–34.0)
MCHC: 33.1 g/dL (ref 32.0–36.0)
MCV: 91 fL (ref 81–101)
MONO#: 0.5 10*3/uL (ref 0.1–0.9)
MONO%: 8.4 % (ref 0.0–13.0)
NEUT#: 3.6 10*3/uL (ref 1.5–6.5)
NEUT%: 65.4 % (ref 39.6–80.0)
Platelets: 314 10*3/uL (ref 145–400)
RBC: 3.99 10*6/uL (ref 3.70–5.32)
RDW: 12.9 % (ref 11.1–15.7)
WBC: 5.5 10*3/uL (ref 3.9–10.0)

## 2014-10-24 LAB — COMPREHENSIVE METABOLIC PANEL
ALBUMIN: 3.9 g/dL (ref 3.6–5.1)
ALT: 23 U/L (ref 6–29)
AST: 18 U/L (ref 10–35)
Alkaline Phosphatase: 70 U/L (ref 33–115)
BILIRUBIN TOTAL: 0.3 mg/dL (ref 0.2–1.2)
BUN: 16 mg/dL (ref 7–25)
CO2: 28 mmol/L (ref 20–31)
Calcium: 8.8 mg/dL (ref 8.6–10.2)
Chloride: 100 mmol/L (ref 98–110)
Creatinine, Ser: 0.9 mg/dL (ref 0.50–1.10)
GLUCOSE: 83 mg/dL (ref 65–99)
Potassium: 4 mmol/L (ref 3.5–5.3)
Sodium: 140 mmol/L (ref 135–146)
Total Protein: 6.4 g/dL (ref 6.1–8.1)

## 2014-10-24 LAB — TSH: TSH: 3.74 u[IU]/mL (ref 0.350–4.500)

## 2014-10-24 NOTE — Progress Notes (Signed)
Hematology and Oncology Follow Up Visit  KEMANI HEIDEL [id number] 1965-07-25 49 y.o. 10/24/2014   Principle Diagnosis:   Stage IIC (T4N2M0) adenocarcinoma of the sigmoid colon --KRAS +  Current Therapy:    Observattion     Interim History:  Ms. Remmert is back for follow-up. We first saw her in November. Since then, she's been doing okay. Her husband's job just got completed down [place]. It sounds like he may be going to [place] next.  Her main concern has been weight gain. She's gained 12 pounds since we last saw her. We will check a TSH on her.  She also has a "lump" in the lumbar region of her back. This is probably about the L3 level. It is on the right side of the spine. It feels as if it is a lipoma.  There's been no change in bowel or bladder habits. She's had no bleeding.  I do not think that she has had a mammogram. This probably will have to be ordered for her. I'm not sure she even sees a primary care doctor.  There has been no change in her medications.  Overall, she has a performance status of ECOG 0.  Medications:  Current outpatient prescriptions:  .  diazepam (VALIUM) 5 MG tablet, Take 5 mg by mouth 2 (two) times daily as needed., Disp: , Rfl: 0 .  DULoxetine (CYMBALTA) 30 MG capsule, Take 30 mg by mouth daily., Disp: , Rfl:  .  HYDROcodone-acetaminophen (NORCO/VICODIN) 5-325 MG per tablet, Take 1 tablet by mouth every 6 (six) hours as needed for moderate pain., Disp: , Rfl:  .  methadone (DOLOPHINE) 10 MG tablet, , Disp: , Rfl:  .  metoprolol tartrate (LOPRESSOR) 25 MG tablet, TAKE 1/2 TAB DAILY. MAY INCREASE TO 1/2 TAB TWICE A DAY IF TREMORS PERSIST, Disp: , Rfl: 3 .  omeprazole (PRILOSEC) 20 MG capsule, Take 20 mg by mouth daily., Disp: , Rfl:  .  ondansetron (ZOFRAN) 4 MG tablet, Take 4 mg by mouth 2 (two) times daily., Disp: , Rfl:  .  pyridOXINE (VITAMIN B-6) 100 MG tablet, Take 2 pills a day., Disp: 60 tablet, Rfl: 12 .  spironolactone (ALDACTONE) 25 MG  tablet, Take 25 mg by mouth daily., Disp: , Rfl:  .  spironolactone (ALDACTONE) 50 MG tablet, Take 50 mg by mouth 2 (two) times daily., Disp: , Rfl: 5 .  vitamin B-12 (CYANOCOBALAMIN) 1000 MCG tablet, Take 1,000 mcg by mouth daily., Disp: , Rfl:  .  ALPRAZolam (XANAX) 0.5 MG tablet, TAKE 1 TABLET BY MOUTH TWICE A DAY AS NEEDED FOR ANXIETY. (Patient not taking: Reported on 10/24/2014), Disp: 60 tablet, Rfl: 0 No current facility-administered medications for this visit.  Facility-Administered Medications Ordered in Other Visits:  .  sodium chloride 0.9 % injection 10 mL, 10 mL, Intracatheter, PRN, Ladell Pier, MD  Allergies:  Allergies  Allergen Reactions  . Lorazepam Hives and Swelling    Patient reports receiving lorazepam intensol in the hospital and experienced swelling with hives.    Past Medical History, Surgical history, Social history, and Family History were reviewed and updated.  Review of Systems: As above  Physical Exam:  height is 5' 7"  (1.702 m) and weight is 211 lb (95.709 kg). Her oral temperature is 97.5 F (36.4 C). Her blood pressure is 127/77 and her pulse is 77. Her respiration is 18.   Wt Readings from Last 3 Encounters:  10/24/14 211 lb (95.709 kg)  05/25/14 199 lb (90.266  kg)  01/16/14 204 lb (92.534 kg)     Well-developed and well-nourished white female. Head and neck exam shows no ocular or oral lesions. There are no palpable cervical or supraclavicular lymph nodes. Lungs are clear. Cardiac exam regular rate and rhythm with no murmurs, rubs or bruits. Abdomen is soft. There are good bowel sounds. She has well-healed laparotomy wound. There is no fluid wave. There is no palpable liver or spleen tip. Back exam shows no tenderness over the spine, ribs or hips. There is a lipoma-type lesion in the right paravertebral area in the lumbar spine. 5 measures about 1.5 cm. It is mobile and nontender. Extremities shows no clubbing, cyanosis or edema. Skin exam no  rashes, ecchymoses or petechia.  Lab Results  Component Value Date   WBC 5.5 10/24/2014   HGB 12.0 10/24/2014   HCT 36.2 10/24/2014   MCV 91 10/24/2014   PLT 314 10/24/2014     Chemistry      Component Value Date/Time   NA 137 05/25/2014 0927   NA 142 01/19/2013 1009   K 4.5 05/25/2014 0927   K 3.3* 01/19/2013 1009   CL 99 05/25/2014 0927   CL 105 08/11/2012 0941   CO2 27 05/25/2014 0927   CO2 23 01/19/2013 1009   BUN 12 05/25/2014 0927   BUN 14.0 01/19/2013 1009   CREATININE 0.89 05/25/2014 0927   CREATININE 0.8 01/19/2013 1009      Component Value Date/Time   CALCIUM 9.3 05/25/2014 0927   CALCIUM 9.0 01/19/2013 1009   ALKPHOS 60 05/25/2014 0927   ALKPHOS 66 01/19/2013 1009   AST 15 05/25/2014 0927   AST 18 01/19/2013 1009   ALT 17 05/25/2014 0927   ALT 15 01/19/2013 1009   BILITOT 0.3 05/25/2014 0927   BILITOT 0.27 01/19/2013 1009         Impression and Plan: Ms. Schall is 49 year old white female with stage IIIC colon cancer. She underwent a sigmoid colectomy back in April 2014. She then had adjuvant radiation and chemotherapy. She completed this in November 2014.  I don't see any evidence of recurrent disease.  I'm not sure as to why she has this weight gain. She keeps gaining weight. I will know if one of her medicines might be a factor. I am checking her TSH for hypothyroidism.  I told her that if the test for hypothyroidism is normal, then she really will have to get back with her family doctor to see what else can be done about the weight gain.  She has the lipoma on the back. I reassured her that this was not can be a problem. She does not need any surgery for this.  we will plan to get her back to see [place] in another 4 months.     Volanda Napoleon, MD 8/24/201612:13 PM

## 2014-10-25 ENCOUNTER — Encounter: Payer: Self-pay | Admitting: *Deleted

## 2014-10-29 ENCOUNTER — Encounter: Payer: Self-pay | Admitting: Internal Medicine

## 2014-10-29 ENCOUNTER — Ambulatory Visit (INDEPENDENT_AMBULATORY_CARE_PROVIDER_SITE_OTHER): Payer: 59 | Admitting: Internal Medicine

## 2014-10-29 VITALS — BP 108/80 | HR 72 | Ht 67.0 in | Wt 220.4 lb

## 2014-10-29 DIAGNOSIS — R140 Abdominal distension (gaseous): Secondary | ICD-10-CM

## 2014-10-29 DIAGNOSIS — Z85038 Personal history of other malignant neoplasm of large intestine: Secondary | ICD-10-CM | POA: Insufficient documentation

## 2014-10-29 DIAGNOSIS — R635 Abnormal weight gain: Secondary | ICD-10-CM | POA: Diagnosis not present

## 2014-10-29 NOTE — Progress Notes (Signed)
Referred by Alvester Chou, NP Subjective:    Patient ID: Sheri Calderon, female    DOB: 1965-06-08, 49 y.o.   MRN: [id number] Cc: hx colon cancer, bloating HPI Very nie 49 yo woman that presented with LLQ pain and suspected perforated diverticulitis 2014 - had resection by Dr. 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 Had chemo/XRT and is in remission so far we think. Has never had a colonoscopy. Has had weight gain oof 30# over past few months, feels bloated all the time. Some heartburn, some nausea.  No FHx CRCA but mother and maternal grandmother had "stomach cancer"   Allergies  Allergen Reactions  . Lorazepam Hives and Swelling    Patient reports receiving lorazepam intensol in the hospital and experienced swelling with hives.   Outpatient Prescriptions Prior to Visit  Medication Sig Dispense Refill  . diazepam (VALIUM) 5 MG tablet Take 5 mg by mouth 2 (two) times daily as needed.  0  . DULoxetine (CYMBALTA) 30 MG capsule Take 30 mg by mouth daily.    . metoprolol tartrate (LOPRESSOR) 25 MG tablet TAKE 1/2 TAB DAILY. MAY INCREASE TO 1/2 TAB TWICE A DAY IF TREMORS PERSIST  3  . omeprazole (PRILOSEC) 20 MG capsule Take 20 mg by mouth daily.    . ondansetron (ZOFRAN) 4 MG tablet Take 4 mg by mouth 2 (two) times daily.    Marland Kitchen spironolactone (ALDACTONE) 50 MG tablet Take 50 mg by mouth 2 (two) times daily.  5  . methadone (DOLOPHINE) 10 MG tablet 10 mg 2 (two) times daily.     Marland Kitchen ALPRAZolam (XANAX) 0.5 MG tablet TAKE 1 TABLET BY MOUTH TWICE A DAY AS NEEDED FOR ANXIETY. (Patient not taking: Reported on 10/24/2014) 60 tablet 0  . HYDROcodone-acetaminophen (NORCO/VICODIN) 5-325 MG per tablet Take 1 tablet by mouth every 6 (six) hours as needed for moderate pain.    Marland Kitchen pyridOXINE (VITAMIN B-6) 100 MG tablet Take 2 pills a day. 60 tablet 12  . spironolactone (ALDACTONE) 25 MG tablet Take 25 mg by mouth daily.    . vitamin B-12 (CYANOCOBALAMIN) 1000 MCG tablet Take 1,000 mcg by mouth daily.      Facility-Administered Medications Prior to Visit  Medication Dose Route Frequency Provider Last Rate Last Dose  . sodium chloride 0.9 % injection 10 mL  10 mL Intracatheter PRN Ladell Pier, MD       Past Medical History  Diagnosis Date  . Lower extremity edema   . Restless legs syndrome   . Diverticulitis     "this is my 2nd time in hospital w/this in the last 2 wk" (06/21/2012)  . Atrial fibrillation     resolved - does not see cardiologist  . Colon cancer 2014    cancer of the sigmoid colon  . S/P chemotherapy, time since less than 4 weeks   . Anxiety   . Ascites     acute  . Depression   . Gout   . Peripheral neuropathy   . Osteoarthritis   . Tremor, essential   . Allergic rhinitis   . GERD (gastroesophageal reflux disease)   . Hydronephrosis   . PTSD (post-traumatic stress disorder)   . Migraines   . Hypertension    Past Surgical History  Procedure Laterality Date  . Tubal ligation  ~ 1989  . Partial colectomy N/A 06/28/2012    Procedure: PARTIAL COLECTOMY;  Surgeon: Harl Bowie, MD;  Location: [facility];  Service: General;  Laterality: N/A;  . Portacath placement N/A 07/11/2012    Procedure: INSERTION PORT-A-CATH;  Surgeon: Harl Bowie, MD;  Location: [facility];  Service: General;  Laterality: N/A;  . Appendectomy     Social History   Social History  . Marital Status: Married    Spouse Name: Sherren Mocha  . Number of Children: 1  . Years of Education: N/A   Occupational History  .      quality Radio producer   Social History Main Topics  . Smoking status: Former Smoker -- 1.00 packs/day for 15 years    Types: Cigarettes    Start date: 01/17/1991    Quit date: 06/07/2012  . Smokeless tobacco: Never Used     Comment: QUIT 2 YEARS AGO  . Alcohol Use: No  . Drug Use: No  . Sexual Activity: Yes    Birth Control/ Protection: Surgical   Other Topics Concern  . None   Social History Narrative   Married w/grown daughter born 23 - 1 grandson born  2009   [place] 2nd shift for [employer] now [employer]   Husband works out of town frequently   4 caffeine drinks ([place]) qd   10/29/2014 update      Family History  Problem Relation Age of Onset  . Diabetes Mother   . Heart disease Mother   . Stomach cancer Mother   . Ovarian cancer Mother 17  . COPD Father   . Hypertension Father   . Hyperlipidemia Father   . Colon polyps Father     22 lifetime colon polyps  . Down syndrome Paternal Aunt   . Cancer Paternal Uncle     cancer in the spine  . COPD Maternal Grandmother   . Stomach cancer Paternal Grandmother        Review of Systems + headaches, anxious, back pain after chemo/XRT, pedal edema, DOE All other ROs negative or as per HPI    Objective:   Physical Exam @BP  108/80 mmHg  Pulse 72  Ht 5' 7"  (1.702 m)  Wt 220 lb 6.4 oz (99.973 kg)  BMI 34.51 kg/m2@  General:  Well-developed, well-nourished and in no acute distress Eyes:  anicteric. ENT:   Mouth and posterior pharynx free of lesions. Poor dentition Lungs: Clear to auscultation bilaterally. Heart:  S1S2, no rubs, murmurs, gallops. Abdomen:  soft, non-tender, no hepatosplenomegaly, hernia, or mass and BS+.  Rectal: Deferred to colonoscopy Lymph:  no cervical or supraclavicular adenopathy. Extremities:   no edema, cyanosis or clubbing Skin   no rash. + tattoo low back, small lipomatous like lesion, super ball size right lower thoracis area Neuro:  A&O x 3.  Psych:  appropriate mood and  Affect.   Data Reviewed:  Wt Readings from Last 3 Encounters:  10/29/14 220 lb 6.4 oz (99.973 kg)  10/24/14 211 lb (95.709 kg)  05/25/14 199 lb (90.266 kg)   CT scans 014 Op notes 2014 Onclogy note 2016 PCP note 2016 TSH is NL so is CBC     Assessment & Plan:  Personal history of sigmoid colon cancer  Bloating  Weight gain  1. Colonoscopy, has never had one - The risks and benefits as well as alternatives of endoscopic  procedure(s) have been discussed and reviewed. All questions answered. The patient agrees to proceed. 2. Mayneed CT scan given weight gain though exam suggests obesity not ascites 3. She has recommended that her siblings (4 older bros) have colonoscopy though they  haven't yet  I appreciate the opportunity to care for this patient. EW:YBRK, Almyra Free, NP Burney Gauze, MD and Coralie Keens, MD

## 2014-10-29 NOTE — Patient Instructions (Signed)
You have been scheduled for a colonoscopy. Please follow written instructions given to you at your visit today.  Please pick up your over the counter prep supplies at the pharmacy within the next 1-3 days. If you use inhalers (even only as needed), please bring them with you on the day of your procedure.   I appreciate the opportunity to care for you. Silvano Rusk, MD, [facility]

## 2014-11-21 ENCOUNTER — Encounter: Payer: Self-pay | Admitting: Internal Medicine

## 2014-11-21 ENCOUNTER — Ambulatory Visit (AMBULATORY_SURGERY_CENTER): Payer: 59 | Admitting: Internal Medicine

## 2014-11-21 VITALS — BP 121/82 | HR 64 | Temp 98.1°F | Resp 16 | Ht 67.0 in | Wt 220.0 lb

## 2014-11-21 DIAGNOSIS — Z1211 Encounter for screening for malignant neoplasm of colon: Secondary | ICD-10-CM | POA: Diagnosis not present

## 2014-11-21 DIAGNOSIS — Z85038 Personal history of other malignant neoplasm of large intestine: Secondary | ICD-10-CM | POA: Diagnosis present

## 2014-11-21 MED ORDER — SODIUM CHLORIDE 0.9 % IV SOLN: 500.0000 mL | INTRAVENOUS | Status: DC

## 2014-11-21 NOTE — Patient Instructions (Addendum)
Mo polyps or cancer seen!  Your next routine colonoscopy should be in 5 years - 2021.  I appreciate the opportunity to care for you. Gatha Mayer, MD, FACG     YOU HAD AN ENDOSCOPIC PROCEDURE TODAY AT [facility]:   Refer to the procedure report that was given to you for any specific questions about what was found during the examination.  If the procedure report does not answer your questions, please call your gastroenterologist to clarify.  If you requested that your care partner not be given the details of your procedure findings, then the procedure report has been included in a sealed envelope for you to review at your convenience later.  YOU SHOULD EXPECT: Some feelings of bloating in the abdomen. Passage of more gas than usual.  Walking can help get rid of the air that was put into your GI tract during the procedure and reduce the bloating. If you had a lower endoscopy (such as a colonoscopy or flexible sigmoidoscopy) you may notice spotting of blood in your stool or on the toilet paper. If you underwent a bowel prep for your procedure, you may not have a normal bowel movement for a few days.  Please Note:  You might notice some irritation and congestion in your nose or some drainage.  This is from the oxygen used during your procedure.  There is no need for concern and it should clear up in a day or so.  SYMPTOMS TO REPORT IMMEDIATELY:   Following lower endoscopy (colonoscopy or flexible sigmoidoscopy):  Excessive amounts of blood in the stool  Significant tenderness or worsening of abdominal pains  Swelling of the abdomen that is new, acute  Fever of 100F or higher    For urgent or emergent issues, a gastroenterologist can be reached at any hour by calling [phone].   DIET: Your first meal following the procedure should be a small meal and then it is ok to progress to your normal diet. Heavy or fried foods are harder to digest and may make you  feel nauseous or bloated.  Likewise, meals heavy in dairy and vegetables can increase bloating.  Drink plenty of fluids but you should avoid alcoholic beverages for 24 hours.  ACTIVITY:  You should plan to take it easy for the rest of today and you should NOT DRIVE or use heavy machinery until tomorrow (because of the sedation medicines used during the test).    FOLLOW UP: Our staff will call the number listed on your records the next business day following your procedure to check on you and address any questions or concerns that you may have regarding the information given to you following your procedure. If we do not reach you, we will leave a message.  However, if you are feeling well and you are not experiencing any problems, there is no need to return our call.  We will assume that you have returned to your regular daily activities without incident.  If any biopsies were taken you will be contacted by phone or by letter within the next 1-3 weeks.  Please call us at [phone] if you have not heard about the biopsies in 3 weeks.    SIGNATURES/CONFIDENTIALITY: You and/or your care partner have signed paperwork which will be entered into your electronic medical record.  These signatures attest to the fact that that the information above on your After Visit Summary has been reviewed and is understood.  Full responsibility of  the confidentiality of this discharge information lies with you and/or your care-partner.   Resume medications.

## 2014-11-21 NOTE — Op Note (Signed)
[facility]  [address] [place], [address]   COLONOSCOPY PROCEDURE REPORT  PATIENT: Sheri, Calderon  MR#: [id number] BIRTHDATE: 15-Feb-1966 , 49  yrs. old GENDER: female ENDOSCOPIST: Gatha Mayer, MD, [facility] PROCEDURE DATE:  11/21/2014 PROCEDURE:   Colonoscopy, surveillance and Colonoscopy, screening First Screening Colonoscopy - Avg.  risk and is 50 yrs.  old or older Yes.  Prior Negative Screening - Now for repeat screening. N/A  History of Adenoma - Now for follow-up colonoscopy & has been > or = to 3 yrs.  N/A  Polyps removed today? No Recommend repeat exam, <10 yrs? Yes high risk ASA CLASS:   Class III INDICATIONS:Screening for colonic neoplasia and Surveillance due to prior colonic neoplasia. MEDICATIONS: Propofol 200 mg IV and Monitored anesthesia care  DESCRIPTION OF PROCEDURE:   After the risks benefits and alternatives of the procedure were thoroughly explained, informed consent was obtained.  The digital rectal exam revealed no abnormalities of the rectum.   The LB [id number]  endoscope was introduced through the anus and advanced to the cecum, which was identified by both the appendix and ileocecal valve. No adverse events experienced.   The quality of the prep was (MiraLax was used) good.  The instrument was then slowly withdrawn as the colon was fully examined. Estimated blood loss is zero unless otherwise noted in this procedure report.      COLON FINDINGS: A normal appearing cecum, ileocecal valve, and appendiceal orifice were identified.  The ascending, transverse, descending, sigmoid colon, and rectum appeared unremarkable. There was evidence of a prior colo-colonic surgical anastomosis in the left colon.  Retroflexed views revealed no abnormalities. The time to cecum = 1.4 Withdrawal time = 7.1   The scope was withdrawn and the procedure completed. COMPLICATIONS: There were no immediate complications.  ENDOSCOPIC IMPRESSION: 1.    Normal colonoscopy 2.   There was evidence of a prior colo-colonic surgical anastomosis in the left colon  RECOMMENDATIONS: Repeat Colonoscopy in 5 years.  Hx colon cancer - presented with perforated cancer 2014  eSigned:  Gatha Mayer, MD, [facility] 11/21/2014 10:25 AM   cc: Dr. Burney Gauze, Dr. Coralie Keens, Alvester Chou, NP and The Patient

## 2014-11-21 NOTE — Progress Notes (Signed)
Patient awakening,vss,report to rn 

## 2014-11-22 ENCOUNTER — Telehealth: Payer: Self-pay

## 2014-11-22 NOTE — Telephone Encounter (Signed)
  Follow up Call-  Call back number 11/21/2014  Post procedure Call Back phone  # [phone]  Permission to leave phone message Yes     Patient questions:  Do you have a fever, pain , or abdominal swelling? No. Pain Score  0 *  Have you tolerated food without any problems? Yes.    Have you been able to return to your normal activities? Yes.    Do you have any questions about your discharge instructions: Diet   No. Medications  No. Follow up visit  No.  Do you have questions or concerns about your Care? No.  Actions: * If pain score is 4 or above: No action needed, pain <4.

## 2014-12-18 ENCOUNTER — Ambulatory Visit (HOSPITAL_BASED_OUTPATIENT_CLINIC_OR_DEPARTMENT_OTHER)
Admission: RE | Admit: 2014-12-18 | Discharge: 2014-12-18 | Disposition: A | Discharge status: Home / Self Care | Payer: 59 | Source: Ambulatory Visit | Attending: Hematology & Oncology | Admitting: Hematology & Oncology

## 2014-12-18 DIAGNOSIS — Z1231 Encounter for screening mammogram for malignant neoplasm of breast: Secondary | ICD-10-CM | POA: Diagnosis present

## 2014-12-18 DIAGNOSIS — R635 Abnormal weight gain: Secondary | ICD-10-CM

## 2014-12-28 ENCOUNTER — Ambulatory Visit (HOSPITAL_BASED_OUTPATIENT_CLINIC_OR_DEPARTMENT_OTHER): Payer: 59 | Admitting: Family

## 2014-12-28 ENCOUNTER — Other Ambulatory Visit (HOSPITAL_BASED_OUTPATIENT_CLINIC_OR_DEPARTMENT_OTHER): Payer: 59

## 2014-12-28 ENCOUNTER — Encounter: Payer: Self-pay | Admitting: Family

## 2014-12-28 ENCOUNTER — Encounter (HOSPITAL_BASED_OUTPATIENT_CLINIC_OR_DEPARTMENT_OTHER): Payer: Self-pay

## 2014-12-28 ENCOUNTER — Ambulatory Visit (HOSPITAL_BASED_OUTPATIENT_CLINIC_OR_DEPARTMENT_OTHER)
Admission: RE | Admit: 2014-12-28 | Discharge: 2014-12-28 | Disposition: A | Discharge status: Home / Self Care | Payer: 59 | Source: Ambulatory Visit | Attending: Family | Admitting: Family

## 2014-12-28 VITALS — BP 122/86 | HR 71 | Temp 97.5°F | Resp 18 | Ht 67.0 in | Wt 210.0 lb

## 2014-12-28 DIAGNOSIS — R1032 Left lower quadrant pain: Secondary | ICD-10-CM | POA: Diagnosis present

## 2014-12-28 DIAGNOSIS — Z9049 Acquired absence of other specified parts of digestive tract: Secondary | ICD-10-CM | POA: Insufficient documentation

## 2014-12-28 DIAGNOSIS — C187 Malignant neoplasm of sigmoid colon: Secondary | ICD-10-CM

## 2014-12-28 DIAGNOSIS — Z85038 Personal history of other malignant neoplasm of large intestine: Secondary | ICD-10-CM

## 2014-12-28 DIAGNOSIS — R1030 Lower abdominal pain, unspecified: Secondary | ICD-10-CM

## 2014-12-28 DIAGNOSIS — Z923 Personal history of irradiation: Secondary | ICD-10-CM | POA: Insufficient documentation

## 2014-12-28 DIAGNOSIS — R140 Abdominal distension (gaseous): Secondary | ICD-10-CM

## 2014-12-28 DIAGNOSIS — R109 Unspecified abdominal pain: Secondary | ICD-10-CM

## 2014-12-28 DIAGNOSIS — Z9221 Personal history of antineoplastic chemotherapy: Secondary | ICD-10-CM | POA: Insufficient documentation

## 2014-12-28 DIAGNOSIS — M545 Low back pain: Secondary | ICD-10-CM | POA: Diagnosis not present

## 2014-12-28 DIAGNOSIS — R635 Abnormal weight gain: Secondary | ICD-10-CM

## 2014-12-28 LAB — CBC WITH DIFFERENTIAL (CANCER CENTER ONLY)
BASO#: 0 10*3/uL (ref 0.0–0.2)
BASO%: 0.5 % (ref 0.0–2.0)
EOS ABS: 0.3 10*3/uL (ref 0.0–0.5)
EOS%: 4.3 % (ref 0.0–7.0)
HEMATOCRIT: 38.5 % (ref 34.8–46.6)
HGB: 12.5 g/dL (ref 11.6–15.9)
LYMPH#: 1.5 10*3/uL (ref 0.9–3.3)
LYMPH%: 25.6 % (ref 14.0–48.0)
MCH: 29.3 pg (ref 26.0–34.0)
MCHC: 32.5 g/dL (ref 32.0–36.0)
MCV: 90 fL (ref 81–101)
MONO#: 0.5 10*3/uL (ref 0.1–0.9)
MONO%: 8.4 % (ref 0.0–13.0)
NEUT#: 3.7 10*3/uL (ref 1.5–6.5)
NEUT%: 61.2 % (ref 39.6–80.0)
Platelets: 297 10*3/uL (ref 145–400)
RBC: 4.27 10*6/uL (ref 3.70–5.32)
RDW: 12.8 % (ref 11.1–15.7)
WBC: 6 10*3/uL (ref 3.9–10.0)

## 2014-12-28 LAB — COMPREHENSIVE METABOLIC PANEL (CC13)
ALK PHOS: 78 U/L (ref 40–150)
ALT: 33 U/L (ref 0–55)
AST: 24 U/L (ref 5–34)
Albumin: 3.7 g/dL (ref 3.5–5.0)
Anion Gap: 8 mEq/L (ref 3–11)
BUN: 12 mg/dL (ref 7.0–26.0)
CALCIUM: 9.7 mg/dL (ref 8.4–10.4)
CHLORIDE: 103 meq/L (ref 98–109)
CO2: 29 mEq/L (ref 22–29)
Creatinine: 1 mg/dL (ref 0.6–1.1)
EGFR: 66 mL/min/{1.73_m2} — AB (ref >90)
Glucose: 103 mg/dl (ref 70–140)
POTASSIUM: 4.5 meq/L (ref 3.5–5.1)
SODIUM: 140 meq/L (ref 136–145)
Total Bilirubin: <0.3 mg/dL (ref 0.20–1.20)
Total Protein: 7.3 g/dL (ref 6.4–8.3)

## 2014-12-28 MED ORDER — IOHEXOL 300 MG/ML  SOLN
50.0000 mL | Freq: Once | INTRAMUSCULAR | Status: AC | PRN
  Administered 2014-12-28: 50 mL via ORAL

## 2014-12-28 MED ORDER — IOHEXOL 300 MG/ML  SOLN
100.0000 mL | Freq: Once | INTRAMUSCULAR | Status: AC | PRN
  Administered 2014-12-28: 100 mL via INTRAVENOUS

## 2014-12-28 NOTE — Progress Notes (Signed)
Hematology and Oncology Follow Up Visit  Sheri Calderon [id number] August 22, 1965 49 y.o. 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 She has had no blood in her stool. She is tender on palpation of her left side. Right side is non tender. She has bowel sounds in all 4 quadrants.  She has a healthy appetite and is staying hydrated. Her weight is down 10 lbs since her last visit. She is happy to hear this as she has been concerned about weight gain.  No fever, chills, n/v, cough, rash, dizziness, chest pain, palpitations or changes in bowel or bladder habits.  She is a former smoker and has some SOB due to COPD. This is unchanged.  She has pain and tenderness in her lower back. The pain shoots down into her right buttock at times. She states that she radiation in that general area as part of her treatment for colon cancer.  No lymphadenopathy found on assessment.  Her mammogram earlier this months was negative.   Medications:    Medication List       This list is accurate as of: 12/28/14 11:22 AM.  Always use your most recent med list.               clonazePAM 0.5 MG tablet  Commonly known as:  KLONOPIN  Take 0.5 mg by mouth.     diazepam 5 MG tablet  Commonly known as:  VALIUM  Take 5 mg by mouth 2 (two) times daily as needed.     DULoxetine 30 MG capsule  Commonly known as:  CYMBALTA  Take 30 mg by mouth daily.     HYDROcodone-acetaminophen 10-325 MG tablet  Commonly known as:  NORCO  Take 1 tablet by mouth every 6 (six) hours as needed (Take 1-2 tablets as needed.).     methadone 10 MG tablet  Commonly known as:  DOLOPHINE  10 mg 2 (two) times daily.     metoprolol tartrate 25 MG tablet  Commonly known as:  LOPRESSOR  TAKE  1/2 TAB DAILY. MAY INCREASE TO 1/2 TAB TWICE A DAY IF TREMORS PERSIST     omeprazole 20 MG capsule  Commonly known as:  PRILOSEC  Take 20 mg by mouth daily.     ondansetron 4 MG tablet  Commonly known as:  ZOFRAN  Take 4 mg by mouth 2 (two) times daily.     spironolactone 50 MG tablet  Commonly known as:  ALDACTONE  Take 50 mg by mouth 2 (two) times daily.        Allergies:  Allergies  Allergen Reactions  . Lorazepam Hives and Swelling    Patient reports receiving lorazepam intensol in the hospital and experienced swelling with hives.    Past Medical History, Surgical history, Social history, and Family History were reviewed and updated.  Review of Systems: All other 10 point review of systems is negative.   Physical Exam:  height is 5' 7"  (1.702 m) and weight is 210 lb (95.255 kg). Her oral temperature is 97.5 F (36.4 C). Her blood pressure is 122/86 and her pulse is 71. Her respiration is 18.   Wt Readings from Last 3 Encounters:  12/28/14 210 lb (95.255 kg)  11/21/14 220 lb (  99.791 kg)  10/29/14 220 lb 6.4 oz (99.973 kg)    Ocular: Sclerae unicteric, pupils equal, round and reactive to light Ear-nose-throat: Oropharynx clear, dentition fair Lymphatic: No cervical or supraclavicular adenopathy Lungs no rales or rhonchi, good excursion bilaterally Heart regular rate and rhythm, no murmur appreciated Abd soft, nontender, positive bowel sounds, no organomegally MSK no focal spinal tenderness, no joint edema Neuro: non-focal, well-oriented, appropriate affect Breasts: Deferred   Lab Results  Component Value Date   WBC 6.0 12/28/2014   HGB 12.5 12/28/2014   HCT 38.5 12/28/2014   MCV 90 12/28/2014   PLT 297 12/28/2014   No results found for: FERRITIN, IRON, TIBC, UIBC, IRONPCTSAT Lab Results  Component Value Date   RBC 4.27 12/28/2014   No results found for: KPAFRELGTCHN, LAMBDASER, KAPLAMBRATIO No results found for: IGGSERUM, IGA, IGMSERUM No results  found for: Odetta Pink, SPEI   Chemistry      Component Value Date/Time   NA 140 10/24/2014 1022   NA 142 01/19/2013 1009   K 4.0 10/24/2014 1022   K 3.3* 01/19/2013 1009   CL 100 10/24/2014 1022   CL 105 08/11/2012 0941   CO2 28 10/24/2014 1022   CO2 23 01/19/2013 1009   BUN 16 10/24/2014 1022   BUN 14.0 01/19/2013 1009   CREATININE 0.90 10/24/2014 1022   CREATININE 0.8 01/19/2013 1009      Component Value Date/Time   CALCIUM 8.8 10/24/2014 1022   CALCIUM 9.0 01/19/2013 1009   ALKPHOS 70 10/24/2014 1022   ALKPHOS 66 01/19/2013 1009   AST 18 10/24/2014 1022   AST 18 01/19/2013 1009   ALT 23 10/24/2014 1022   ALT 15 01/19/2013 1009   BILITOT 0.3 10/24/2014 1022   BILITOT 0.27 01/19/2013 1009     Impression and Plan: Sheri Calderon is 49 year old white female with stage IIIC colon cancer. She underwent a sigmoid colectomy back in April 2014 and then received adjuvant radiation and chemotherapy which was completed in November 2014. So far there has been evidence of recurrence.  CBC and CMP today look good. CEA is pending.  She is here today with complaint of lower back pain, left sided abdominal pain and "bloating." We got a CT of the abdomen and pelvis today to evaluate.This showed no evidence of abnormality or malignancy. She did have some mild lumbar spine degenerative changes.  She has an appointment with our office in December and we will keep this.  She knows to contact us with any questions or concerns. We can certainly see her sooner if need be.   Eliezer Bottom, NP 10/28/201611:22 AM

## 2014-12-29 LAB — CEA: CEA: 1.4 ng/mL (ref 0.0–5.0)

## 2015-01-11 ENCOUNTER — Telehealth: Payer: Self-pay | Admitting: *Deleted

## 2015-01-11 NOTE — Telephone Encounter (Signed)
Patient called stating that she has extreme back pain and needs pain medication to treat. She states she had an appointment with her PCP which was cancelled because her PCP was unable to come to work. She states that she had planned to ask PCP for stronger medications and a consult to neurology.  On review of her chart, this office treats her for colon cancer (observation), she is currently prescribed vicoden, valium and methadone from other pre scribers, and a recent CT scan shows she has mild degenerative disc disease.  Reviewed with patient that this office does not treat her back pain and she needs to follow up with her PCP office. Explained that they should have an MD on call who can address her concerns. Patient unhappy with discussion and asked if this office could call the PCP office to help expedite her requests. Explained that this was something this office could not do. Patient frustrated with lack of response from PCP office and this offices inability to help.

## 2015-02-15 ENCOUNTER — Other Ambulatory Visit: Payer: Self-pay | Admitting: *Deleted

## 2015-02-15 DIAGNOSIS — C187 Malignant neoplasm of sigmoid colon: Secondary | ICD-10-CM

## 2015-02-18 ENCOUNTER — Other Ambulatory Visit: Payer: 59

## 2015-02-18 ENCOUNTER — Ambulatory Visit: Payer: 59 | Admitting: Family

## 2015-11-25 ENCOUNTER — Other Ambulatory Visit (HOSPITAL_BASED_OUTPATIENT_CLINIC_OR_DEPARTMENT_OTHER): Payer: 59

## 2015-11-25 ENCOUNTER — Encounter: Payer: Self-pay | Admitting: Family

## 2015-11-25 ENCOUNTER — Ambulatory Visit (HOSPITAL_BASED_OUTPATIENT_CLINIC_OR_DEPARTMENT_OTHER): Payer: 59 | Admitting: Family

## 2015-11-25 VITALS — BP 125/80 | HR 84 | Temp 97.4°F | Resp 16 | Ht 67.0 in | Wt 224.0 lb

## 2015-11-25 DIAGNOSIS — M545 Low back pain: Secondary | ICD-10-CM

## 2015-11-25 DIAGNOSIS — C187 Malignant neoplasm of sigmoid colon: Secondary | ICD-10-CM

## 2015-11-25 DIAGNOSIS — Z85038 Personal history of other malignant neoplasm of large intestine: Secondary | ICD-10-CM | POA: Diagnosis not present

## 2015-11-25 DIAGNOSIS — F419 Anxiety disorder, unspecified: Secondary | ICD-10-CM

## 2015-11-25 LAB — CBC WITH DIFFERENTIAL (CANCER CENTER ONLY)
BASO#: 0 10*3/uL (ref 0.0–0.2)
BASO%: 0.2 % (ref 0.0–2.0)
EOS ABS: 0.2 10*3/uL (ref 0.0–0.5)
EOS%: 2.2 % (ref 0.0–7.0)
HCT: 37.5 % (ref 34.8–46.6)
HGB: 12.3 g/dL (ref 11.6–15.9)
LYMPH#: 1.3 10*3/uL (ref 0.9–3.3)
LYMPH%: 16.6 % (ref 14.0–48.0)
MCH: 29.9 pg (ref 26.0–34.0)
MCHC: 32.8 g/dL (ref 32.0–36.0)
MCV: 91 fL (ref 81–101)
MONO#: 0.6 10*3/uL (ref 0.1–0.9)
MONO%: 7.1 % (ref 0.0–13.0)
NEUT#: 6 10*3/uL (ref 1.5–6.5)
NEUT%: 73.9 % (ref 39.6–80.0)
PLATELETS: 362 10*3/uL (ref 145–400)
RBC: 4.12 10*6/uL (ref 3.70–5.32)
RDW: 13.4 % (ref 11.1–15.7)
WBC: 8.1 10*3/uL (ref 3.9–10.0)

## 2015-11-25 LAB — COMPREHENSIVE METABOLIC PANEL
ALT: 26 U/L (ref 0–55)
ANION GAP: 10 meq/L (ref 3–11)
AST: 15 U/L (ref 5–34)
Albumin: 3.5 g/dL (ref 3.5–5.0)
Alkaline Phosphatase: 77 U/L (ref 40–150)
BUN: 17.7 mg/dL (ref 7.0–26.0)
CHLORIDE: 106 meq/L (ref 98–109)
CO2: 25 meq/L (ref 22–29)
Calcium: 9.4 mg/dL (ref 8.4–10.4)
Creatinine: 1.1 mg/dL (ref 0.6–1.1)
EGFR: 56 mL/min/{1.73_m2} — AB (ref >90)
Glucose: 108 mg/dl (ref 70–140)
POTASSIUM: 4.5 meq/L (ref 3.5–5.1)
Sodium: 141 mEq/L (ref 136–145)
Total Bilirubin: <0.3 mg/dL (ref 0.20–1.20)
Total Protein: 7.1 g/dL (ref 6.4–8.3)

## 2015-11-25 NOTE — Progress Notes (Signed)
Hematology and Oncology Follow Up Visit  Sheri Calderon [id number] [birth year] 50 y.o. 11/25/2015   Principle Diagnosis:  Stage IIC (T4N2M0) adenocarcinoma of the sigmoid colon --KRAS +  Current Therapy:   Observation    Interim History:  Sheri Calderon is here today for follow-up. She missed her follow-up that was scheduled for last December. She is doing fairly well. She is still having pain and tenderness in her lower back. 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 This pain shoots down into her right buttock at times. She states that she had radiation in that general area as part of her treatment for colon cancer.  She stays busy and has not been sleeping well. She had several of her anxiety medications D/C'd and is currently weaning off her methadone.  She has had a headache for the last few weeks and feels that this is due to her vision. She has an appointment with her ophthalmologist soon.  No fever, chills, cough, rash, dizziness, chest pain, palpitations or changes in bowel or bladder habits.  She is a former smoker and has some SOB due to COPD. This is unchanged.  She has occasional nausea since her surgery, no vomiting.  She has Numbness and tingling in her hands and feet that comes and goes. This is not a new issue for her and is unchanged. She has not had any recent falls and no syncopal episodes.  No lymphadenopathy found on assessment. No episodes of bleeding or bruising.   Medications:    Medication List       Accurate as of 11/25/15  1:00 PM. Always use your most recent med list.          clonazePAM 0.5 MG tablet Commonly known as:  KLONOPIN Take 0.5 mg by mouth.   diazepam 5 MG tablet Commonly known as:  VALIUM Take 5 mg by mouth 2 (two) times daily as needed.   DULoxetine 30 MG capsule Commonly known as:  CYMBALTA Take 30 mg by mouth daily.   HYDROcodone-acetaminophen 10-325 MG tablet Commonly known  as:  NORCO Take 1 tablet by mouth every 6 (six) hours as needed (Take 1-2 tablets as needed.).   methadone 10 MG tablet Commonly known as:  DOLOPHINE 10 mg 2 (two) times daily.   metoprolol tartrate 25 MG tablet Commonly known as:  LOPRESSOR TAKE 1/2 TAB DAILY. MAY INCREASE TO 1/2 TAB TWICE A DAY IF TREMORS PERSIST   omeprazole 20 MG capsule Commonly known as:  PRILOSEC Take 20 mg by mouth daily.   ondansetron 4 MG tablet Commonly known as:  ZOFRAN Take 4 mg by mouth 2 (two) times daily.   spironolactone 50 MG tablet Commonly known as:  ALDACTONE Take 50 mg by mouth 2 (two) times daily.       Allergies:  Allergies  Allergen Reactions  . Lorazepam Hives and Swelling    Patient reports receiving lorazepam intensol in the hospital and experienced swelling with hives.    Past Medical History, Surgical history, Social history, and Family History were reviewed and updated.  Review of Systems: All other 10 point review of systems is negative.   Physical Exam:  vitals were not taken for this visit.  Wt Readings from Last 3 Encounters:  12/28/14 210 lb (95.3 kg)  11/21/14 220 lb (99.8 kg)  10/29/14 220 lb 6.4 oz (100 kg)    Ocular: Sclerae unicteric, pupils equal, round and  reactive to light Ear-nose-throat: Oropharynx clear, dentition fair Lymphatic: No cervical supraclavicular or axillary adenopathy Lungs no rales or rhonchi, good excursion bilaterally Heart regular rate and rhythm, no murmur appreciated Abd soft, nontender, positive bowel sounds, no liver or spleen tip palpated on exam  MSK no focal spinal tenderness, no joint edema Neuro: non-focal, well-oriented, appropriate affect Breasts: Deferred   Lab Results  Component Value Date   WBC 6.0 12/28/2014   HGB 12.5 12/28/2014   HCT 38.5 12/28/2014   MCV 90 12/28/2014   PLT 297 12/28/2014   No results found for: FERRITIN, IRON, TIBC, UIBC, IRONPCTSAT Lab Results  Component Value Date   RBC 4.27  12/28/2014   No results found for: KPAFRELGTCHN, LAMBDASER, KAPLAMBRATIO No results found for: IGGSERUM, IGA, IGMSERUM No results found for: Odetta Pink, SPEI   Chemistry      Component Value Date/Time   NA 140 12/28/2014 1055   K 4.5 12/28/2014 1055   CL 100 10/24/2014 1022   CL 105 08/11/2012 0941   CO2 29 12/28/2014 1055   BUN 12.0 12/28/2014 1055   CREATININE 1.0 12/28/2014 1055      Component Value Date/Time   CALCIUM 9.7 12/28/2014 1055   ALKPHOS 78 12/28/2014 1055   AST 24 12/28/2014 1055   ALT 33 12/28/2014 1055   BILITOT <0.30 12/28/2014 1055     Impression and Plan: Sheri Calderon is 50 year old white female with stage IIIC colon cancer. She underwent a sigmoid colectomy back in April 2014 and then received adjuvant radiation and chemotherapy which was completed in November 2014. So far, there has been no evidence of recurrence. She is doing fairly well but still having pain in her lower back that will radiate down her left leg. Her PCP is currently adjusting her anxiety and pain mediation to lower doses.  CBC is stable. CEA in October of last year was 1.4. Today's results are pending.  We will hold off on scan for now unless she develops symptoms.  We will plan to see her back in 6 months for repeat lab work and follow-up.  She will contact our office with any questions or concerns. We can certainly see her sooner if need be.   Eliezer Bottom, NP 9/25/20171:00 PM

## 2015-11-26 LAB — CEA (PARALLEL TESTING): CEA: 1.6 ng/mL

## 2015-11-26 LAB — CEA (IN HOUSE-CHCC): CEA (CHCC-IN HOUSE): 2.16 ng/mL (ref 0.00–5.00)

## 2015-12-09 ENCOUNTER — Other Ambulatory Visit: Payer: Self-pay | Admitting: Family

## 2015-12-09 ENCOUNTER — Telehealth: Payer: Self-pay | Admitting: *Deleted

## 2015-12-09 DIAGNOSIS — C187 Malignant neoplasm of sigmoid colon: Secondary | ICD-10-CM

## 2015-12-09 NOTE — Telephone Encounter (Signed)
Patient called stating that she has had severe abdomen bloating and pain since she was here last.  Spoke with sarah NP and Dr. Marin Olp who ordered a CT scan of the abdomen.  Orders placed.

## 2015-12-11 ENCOUNTER — Ambulatory Visit (HOSPITAL_BASED_OUTPATIENT_CLINIC_OR_DEPARTMENT_OTHER): Admission: RE | Admit: 2015-12-11 | Payer: 59 | Source: Ambulatory Visit

## 2015-12-11 ENCOUNTER — Other Ambulatory Visit: Payer: Self-pay | Admitting: Family

## 2015-12-11 DIAGNOSIS — C187 Malignant neoplasm of sigmoid colon: Secondary | ICD-10-CM

## 2015-12-11 DIAGNOSIS — R1084 Generalized abdominal pain: Secondary | ICD-10-CM

## 2015-12-11 DIAGNOSIS — R140 Abdominal distension (gaseous): Secondary | ICD-10-CM

## 2015-12-12 ENCOUNTER — Ambulatory Visit (HOSPITAL_BASED_OUTPATIENT_CLINIC_OR_DEPARTMENT_OTHER)
Admission: RE | Admit: 2015-12-12 | Discharge: 2015-12-12 | Disposition: A | Discharge status: Home / Self Care | Payer: 59 | Source: Ambulatory Visit | Attending: Family | Admitting: Family

## 2015-12-12 ENCOUNTER — Encounter (HOSPITAL_BASED_OUTPATIENT_CLINIC_OR_DEPARTMENT_OTHER): Payer: Self-pay

## 2015-12-12 DIAGNOSIS — N281 Cyst of kidney, acquired: Secondary | ICD-10-CM | POA: Diagnosis not present

## 2015-12-12 DIAGNOSIS — Z85038 Personal history of other malignant neoplasm of large intestine: Secondary | ICD-10-CM | POA: Diagnosis present

## 2015-12-12 DIAGNOSIS — K760 Fatty (change of) liver, not elsewhere classified: Secondary | ICD-10-CM | POA: Diagnosis not present

## 2015-12-12 DIAGNOSIS — Q638 Other specified congenital malformations of kidney: Secondary | ICD-10-CM | POA: Insufficient documentation

## 2015-12-12 DIAGNOSIS — C187 Malignant neoplasm of sigmoid colon: Secondary | ICD-10-CM

## 2015-12-12 DIAGNOSIS — R1084 Generalized abdominal pain: Secondary | ICD-10-CM | POA: Diagnosis not present

## 2015-12-12 DIAGNOSIS — R140 Abdominal distension (gaseous): Secondary | ICD-10-CM

## 2015-12-12 MED ORDER — IOPAMIDOL (ISOVUE-300) INJECTION 61%
100.0000 mL | Freq: Once | INTRAVENOUS | Status: AC | PRN
  Administered 2015-12-12: 100 mL via INTRAVENOUS

## 2016-01-10 ENCOUNTER — Emergency Department (HOSPITAL_COMMUNITY)
Admission: EM | Admit: 2016-01-10 | Discharge: 2016-01-10 | Disposition: A | Discharge status: Home / Self Care | Payer: 59 | Attending: Emergency Medicine | Admitting: Emergency Medicine

## 2016-01-10 ENCOUNTER — Emergency Department (HOSPITAL_COMMUNITY): Payer: 59

## 2016-01-10 ENCOUNTER — Encounter (HOSPITAL_COMMUNITY): Payer: Self-pay | Admitting: *Deleted

## 2016-01-10 DIAGNOSIS — R062 Wheezing: Secondary | ICD-10-CM | POA: Insufficient documentation

## 2016-01-10 DIAGNOSIS — R10817 Generalized abdominal tenderness: Secondary | ICD-10-CM | POA: Diagnosis not present

## 2016-01-10 DIAGNOSIS — M542 Cervicalgia: Secondary | ICD-10-CM | POA: Insufficient documentation

## 2016-01-10 DIAGNOSIS — Z87891 Personal history of nicotine dependence: Secondary | ICD-10-CM | POA: Insufficient documentation

## 2016-01-10 DIAGNOSIS — Z85038 Personal history of other malignant neoplasm of large intestine: Secondary | ICD-10-CM | POA: Insufficient documentation

## 2016-01-10 DIAGNOSIS — I10 Essential (primary) hypertension: Secondary | ICD-10-CM | POA: Insufficient documentation

## 2016-01-10 LAB — BASIC METABOLIC PANEL
ANION GAP: 10 (ref 5–15)
BUN: 12 mg/dL (ref 6–20)
CHLORIDE: 102 mmol/L (ref 101–111)
CO2: 26 mmol/L (ref 22–32)
CREATININE: 0.93 mg/dL (ref 0.44–1.00)
Calcium: 9.5 mg/dL (ref 8.9–10.3)
GFR calc non Af Amer: >60 mL/min (ref >60)
Glucose, Bld: 101 mg/dL — ABNORMAL HIGH (ref 65–99)
POTASSIUM: 4.3 mmol/L (ref 3.5–5.1)
SODIUM: 138 mmol/L (ref 135–145)

## 2016-01-10 LAB — CBC
HCT: 37.7 % (ref 36.0–46.0)
HEMOGLOBIN: 12.2 g/dL (ref 12.0–15.0)
MCH: 29.1 pg (ref 26.0–34.0)
MCHC: 32.4 g/dL (ref 30.0–36.0)
MCV: 90 fL (ref 78.0–100.0)
PLATELETS: 384 10*3/uL (ref 150–400)
RBC: 4.19 MIL/uL (ref 3.87–5.11)
RDW: 13.2 % (ref 11.5–15.5)
WBC: 8.5 10*3/uL (ref 4.0–10.5)

## 2016-01-10 LAB — RAPID STREP SCREEN (MED CTR MEBANE ONLY): STREPTOCOCCUS, GROUP A SCREEN (DIRECT): NEGATIVE

## 2016-01-10 LAB — I-STAT TROPONIN, ED: Troponin i, poc: 0 ng/mL (ref 0.00–0.08)

## 2016-01-10 LAB — TSH: TSH: 1.354 u[IU]/mL (ref 0.350–4.500)

## 2016-01-10 MED ORDER — MORPHINE SULFATE (PF) 4 MG/ML IV SOLN
4.0000 mg | Freq: Once | INTRAVENOUS | Status: AC
  Administered 2016-01-10: 4 mg via INTRAVENOUS
  Filled 2016-01-10: qty 1

## 2016-01-10 MED ORDER — DIAZEPAM 5 MG PO TABS
5.0000 mg | ORAL_TABLET | Freq: Two times a day (BID) | ORAL | 0 refills | Status: DC
Start: 2016-01-10 — End: 2017-08-01

## 2016-01-10 MED ORDER — GI COCKTAIL ~~LOC~~
30.0000 mL | Freq: Once | ORAL | Status: AC
  Administered 2016-01-10: 30 mL via ORAL
  Filled 2016-01-10: qty 30

## 2016-01-10 MED ORDER — KETOROLAC TROMETHAMINE 30 MG/ML IJ SOLN
30.0000 mg | Freq: Once | INTRAMUSCULAR | Status: AC
  Administered 2016-01-10: 30 mg via INTRAVENOUS
  Filled 2016-01-10: qty 1

## 2016-01-10 MED ORDER — DIAZEPAM 5 MG/ML IJ SOLN
5.0000 mg | Freq: Once | INTRAMUSCULAR | Status: AC
  Administered 2016-01-10: 5 mg via INTRAVENOUS
  Filled 2016-01-10: qty 2

## 2016-01-10 MED ORDER — IOPAMIDOL (ISOVUE-300) INJECTION 61%
INTRAVENOUS | Status: AC
  Administered 2016-01-10: 75 mL
  Filled 2016-01-10: qty 75

## 2016-01-10 NOTE — ED Notes (Signed)
EDP at bedside  

## 2016-01-10 NOTE — ED Notes (Signed)
Pt d/c, prescriptions discussed. Family here to transport patient. Pt ambulatory without difficulty.

## 2016-01-10 NOTE — ED Triage Notes (Signed)
Pt presents with multiple complaints.  Initially stated that her throat was swelling and she felt like she was breathing through a straw (airway patent). Also states post neck pain, 70 lbs weight gain in 1 year.  After triage ended pt stated her chest hurt and she was light headed.

## 2016-01-10 NOTE — ED Notes (Signed)
PA [place] at bedside

## 2016-01-10 NOTE — ED Provider Notes (Signed)
[facility] DEPT Provider Note   CSN: [id number] Arrival date & time: 01/10/16  2671     History   Chief Complaint Chief Complaint  Patient presents with  . Neck Pain  . Shortness of Breath  . Chest Pain    HPI Sheri Calderon is a 50 y.o. female who present with anterior neck pain and swelling with SOB. PMH significant for hx of colon cancer s/p chemo and sigmoid colectomy, chronic back pain, peripheral neuropathy, anxiety/depression. She states that over the past 2 weeks she has had progressive swelling of her anterior neck with associated SOB, difficulty swallowing, and odynophagia. She feels like her throat is closing. Denies fever, chills, chest pain, cough, wheezing. She is a current smoker. No sick contacts. Reports 70# weight gain over the past year. On review of EMR she was 95 kg in Aug 2016 and is 116kg today so more like 46 pounds. Had her thyroid checked 1 year ago and was normal. She reports abdominal swelling and bloating which is chronic on review of EMR. Had a CT A&P on 10/12 which did not show any acute findings. Denies illegal or illicit drug use.  HPI  Past Medical History:  Diagnosis Date  . Allergic rhinitis   . Anxiety   . Ascites    acute  . Atrial fibrillation ([place])    resolved - does not see cardiologist  . Colon cancer [facility]) 2014   cancer of the sigmoid colon  . Depression   . Diverticulitis    "this is my 2nd time in hospital w/this in the last 2 wk" (06/21/2012)  . GERD (gastroesophageal reflux disease)   . Gout   . Hydronephrosis   . Hypertension   . Lower extremity edema   . Migraines   . Osteoarthritis   . Peripheral neuropathy ([place])   . PTSD (post-traumatic stress disorder)   . Restless legs syndrome   . S/P chemotherapy, time since less than 4 weeks   . Tremor, essential     Patient Active Problem List   Diagnosis Date Noted  . Personal history of sigmoid colon cancer 10/29/2014  . Weight gain 10/29/2014  . Cancer of sigmoid colon  ([place]) 08/04/2012  . Restless legs syndrome     Past Surgical History:  Procedure Laterality Date  . APPENDECTOMY    . PARTIAL COLECTOMY N/A 06/28/2012   Procedure: PARTIAL COLECTOMY;  Surgeon: Harl Bowie, MD;  Location: [facility];  Service: General;  Laterality: N/A;  . PORTACATH PLACEMENT N/A 07/11/2012   Procedure: INSERTION PORT-A-CATH;  Surgeon: Harl Bowie, MD;  Location: [facility];  Service: General;  Laterality: N/A;  . portacath removal    . TUBAL LIGATION  ~ 1989    OB History    No data available       Home Medications    Prior to Admission medications   Medication Sig Start Date End Date Taking? Authorizing Provider  DULoxetine (CYMBALTA) 30 MG capsule Take 30 mg by mouth daily.    Historical Provider, MD  methadone (DOLOPHINE) 10 MG tablet 10 mg 2 (two) times daily.  10/22/14   Historical Provider, MD  metoprolol tartrate (LOPRESSOR) 25 MG tablet TAKE 1/2 TAB DAILY. MAY INCREASE TO 1/2 TAB TWICE A DAY IF TREMORS PERSIST 10/13/14   Historical Provider, MD  omeprazole (PRILOSEC) 20 MG capsule Take 20 mg by mouth daily.    Historical Provider, MD  ondansetron (ZOFRAN) 4 MG tablet Take 4 mg by mouth 2 (two) times  daily.    Historical Provider, MD  spironolactone (ALDACTONE) 50 MG tablet Take 50 mg by mouth 2 (two) times daily. 10/12/14   Historical Provider, MD    Family History Family History  Problem Relation Age of Onset  . Diabetes Mother   . Heart disease Mother   . Stomach cancer Mother   . Ovarian cancer Mother 31  . COPD Father   . Hypertension Father   . Hyperlipidemia Father   . Colon polyps Father     26 lifetime colon polyps  . Down syndrome Paternal Aunt   . Cancer Paternal Uncle     cancer in the spine  . COPD Maternal Grandmother   . Stomach cancer Paternal Grandmother     Social History Social History  Substance Use Topics  . Smoking status: Former Smoker    Packs/day: 1.00    Years: 15.00    Types: Cigarettes    Start date:  01/17/1991    Quit date: 06/07/2012  . Smokeless tobacco: Never Used     Comment: QUIT 2 YEARS AGO  . Alcohol use No     Allergies   Lorazepam   Review of Systems Review of Systems  Constitutional: Positive for unexpected weight change. Negative for appetite change, chills, fatigue and fever.  Respiratory: Positive for shortness of breath and stridor. Negative for cough, choking and wheezing.   Cardiovascular: Negative for chest pain, palpitations and leg swelling.  Gastrointestinal: Positive for abdominal distention (chronic). Negative for abdominal pain, nausea and vomiting.  Musculoskeletal: Positive for neck pain. Negative for neck stiffness.  Neurological: Positive for light-headedness and headaches.     Physical Exam Updated Vital Signs BP 143/93 (BP Location: Right Arm)   Pulse 88   Temp 97.6 F (36.4 C) (Oral)   Resp 19   Ht 5' 7"  (1.702 m)   Wt 116.6 kg   LMP 10/12/2012   SpO2 99%   BMI 40.25 kg/m   Physical Exam  Constitutional: She is oriented to person, place, and time. She appears well-developed and well-nourished. She appears distressed.  Obese; uncomfortable; akasthesic  HENT:  Head: Normocephalic and atraumatic.  Mouth/Throat: Uvula is midline, oropharynx is clear and moist and mucous membranes are normal.  Eyes: Conjunctivae are normal. Pupils are equal, round, and reactive to light. Right eye exhibits no discharge. Left eye exhibits no discharge. No scleral icterus.  Neck: Normal range of motion. Neck supple.  Increased dorsocervical fat pad. No obvious anterior swelling or redness however there is tenderness. Thyroid not palpable  Cardiovascular: Normal rate and regular rhythm.  Exam reveals no gallop and no friction rub.   No murmur heard. Pulmonary/Chest: No accessory muscle usage. No tachypnea. No respiratory distress. She has no decreased breath sounds. She has wheezes. She has no rhonchi. She has no rales. She exhibits no tenderness.  No stridor.  Able to talk in full sentences. Increase in effort of breathing with scattered wheezes  Abdominal: Soft. Bowel sounds are normal. She exhibits no distension and no mass. There is no tenderness. There is no rebound and no guarding. No hernia.  Generalized mild tenderness  Musculoskeletal: She exhibits no edema.  No leg swelling  Neurological: She is alert and oriented to person, place, and time.  Cranial nerves grossly intact No ataxia, normal gait   Skin: Skin is warm and dry.  Psychiatric: Her mood appears anxious. She is hyperactive.  Nursing note and vitals reviewed.    ED Treatments / Results  Labs (all labs  ordered are listed, but only abnormal results are displayed) Labs Reviewed  BASIC METABOLIC PANEL - Abnormal; Notable for the following:       Result Value   Glucose, Bld 101 (*)    All other components within normal limits  RAPID STREP SCREEN (NOT AT [facility])  CULTURE, GROUP A STREP [facility])  CBC  TSH  I-STAT TROPOININ, ED    EKG  EKG Interpretation  Date/Time:  Friday January 10 2016 07:32:04 EST Ventricular Rate:  105 PR Interval:  128 QRS Duration: 82 QT Interval:  342 QTC Calculation: 452 R Axis:   64 Text Interpretation:  Sinus tachycardia Low voltage QRS Cannot rule out Anterior infarct , age undetermined Abnormal ECG Confirmed by Alvino Chapel  MD, Ovid Curd [phone]) on 01/10/2016 7:42:53 AM Also confirmed by Alvino Chapel  MD, NATHAN [phone]), editor [place] CT, Leda Gauze [phone])  on 01/10/2016 8:38:59 AM       Radiology Dg Chest 2 View  Result Date: 01/10/2016 CLINICAL DATA:  Shortness of breath for few days, chest pain EXAM: CHEST  2 VIEW COMPARISON:  07/11/2012 FINDINGS: Cardiomediastinal silhouette is stable. No infiltrate or pulmonary edema. Linear atelectasis or scarring noted in right lower lobe. Bony thorax is unremarkable. IMPRESSION: No active disease. Linear atelectasis or scarring in right lower lobe. Electronically Signed   By: Lahoma Crocker M.D.   On:  01/10/2016 07:55   Ct Soft Tissue Neck W Contrast  Result Date: 01/10/2016 CLINICAL DATA:  Neck pain, throat pain.  History of colon cancer. EXAM: CT NECK WITH CONTRAST TECHNIQUE: Multidetector CT imaging of the neck was performed using the standard protocol following the bolus administration of intravenous contrast. CONTRAST:  24m ISOVUE-300 IOPAMIDOL (ISOVUE-300) INJECTION 61% COMPARISON:  None. FINDINGS: Pharynx and larynx: Normal. No mass or swelling. Salivary glands: No inflammation, mass, or stone. Thyroid: Normal. Lymph nodes: None enlarged or abnormal density. Vascular: Small left brachiocephalic vein, likely from history of porta catheter. Limited intracranial: Negative. Visualized orbits: Negative. Mastoids and visualized paranasal sinuses: Anterior nasal septal defect. Skeleton: Degenerative changes without acute or aggressive finding. Upper chest: Negative. IMPRESSION: 1. No acute finding.  No explanation for throat pain. 2. Perforated nasal septum. Electronically Signed   By: JMonte FantasiaM.D.   On: 01/10/2016 09:27    Procedures Procedures (including critical care time)  Medications Ordered in ED Medications  morphine 4 MG/ML injection 4 mg (4 mg Intravenous Given 01/10/16 0911)  iopamidol (ISOVUE-300) 61 % injection (75 mLs  Contrast Given 01/10/16 0855)  gi cocktail (Maalox,Lidocaine,Donnatal) (30 mLs Oral Given 01/10/16 1013)  ketorolac (TORADOL) 30 MG/ML injection 30 mg (30 mg Intravenous Given 01/10/16 1143)  diazepam (VALIUM) injection 5 mg (5 mg Intravenous Given 01/10/16 1144)     Initial Impression / Assessment and Plan / ED Course  I have reviewed the triage vital signs and the nursing notes.  Pertinent labs & imaging results that were available during my care of the patient were reviewed by me and considered in my medical decision making (see chart for details).  Clinical Course    50year old female presents with anterior and posterior neck pain and reported  difficulty breathing and swallowing. Most likely due to exacerbation of chronic pain. Possible anxiety component as well. Patient is afebrile, not tachycardic or tachypneic, normotensive, and not hypoxic. Labs are unremarkable. TSH normal. Strep negative. CT neck remarkable for perforated nasal septum but otherwise normal. Pain treated in ED. Will d/c with trial of NSAIDs and Valium as a muscle relaxer/anti-anxiety med.  Advised PCP follow up. Patient is NAD, non-toxic, with stable VS. Patient is informed of clinical course, understands medical decision making process, and agrees with plan. Opportunity for questions provided and all questions answered. Return precautions given.   Final Clinical Impressions(s) / ED Diagnoses   Final diagnoses:  Neck pain    New Prescriptions Discharge Medication List as of 01/10/2016 11:32 AM    START taking these medications   Details  diazepam (VALIUM) 5 MG tablet Take 1 tablet (5 mg total) by mouth 2 (two) times daily., Starting Fri 01/10/2016, Print         Recardo Evangelist, PA-C 01/10/16 [place], MD 01/10/16 [phone]

## 2016-01-10 NOTE — Discharge Instructions (Signed)
Take Valium as needed for anxiety or muscle spasms - this medicine makes you drowsy so do not take before driving and do not take with Percocet Take Ibuprofen or Tylenol for pain. Take Percocet when pain is severe Follow up with your family doctor

## 2016-01-12 LAB — CULTURE, GROUP A STREP (THRC)

## 2016-02-14 ENCOUNTER — Encounter: Payer: Self-pay | Admitting: Genetic Counselor

## 2016-02-14 DIAGNOSIS — Z1379 Encounter for other screening for genetic and chromosomal anomalies: Secondary | ICD-10-CM | POA: Insufficient documentation

## 2016-02-19 ENCOUNTER — Encounter: Payer: Self-pay | Admitting: Genetic Counselor

## 2016-03-20 ENCOUNTER — Other Ambulatory Visit: Payer: Self-pay | Admitting: Nurse Practitioner

## 2016-04-20 ENCOUNTER — Telehealth: Payer: Self-pay

## 2016-04-20 NOTE — Telephone Encounter (Signed)
Received team health call on a patient who is not a part of the [facility] system.  She does see a Designer, jewellery at the [facility].  Currently, patient is better but still having head, neck and sinus trouble.  She denies any fever and is taking OTC meds.  Given condition is stable I requested that she go to urgent care if needed, follow up with her care provider or schedule a new patient appointment here if she is interested in transferring care.  Patient thanks me for the call and states that she will be seeing her provider, Alvester Chou, NP this Thursday.  And will go to urgent care in meantime if any concerning symptoms develop.  TEAM HEALTH CALL:  Client [facility] - Client Client Site [place] - Day Physician [facility] - PHYSICIAN, Verita Schneiders- MD Contact Type Call Who Is Calling Patient / Member / Family / Caregiver Caller Name [place] Phone Number [phone] Call Type Message Only Information Provided Reason for Call Symptomatic / Request for [place] states c/o pain on top of head, neck pain at spine and nausea. She is not a current patient. Caller transferred to the office per client profile. Call Closed By: Philis Kendall Transaction Date/Time: 04/20/2016 9:26:48 AM (ET)

## 2016-05-25 ENCOUNTER — Other Ambulatory Visit: Payer: 59

## 2016-05-25 ENCOUNTER — Ambulatory Visit: Payer: 59 | Admitting: Family

## 2016-06-04 ENCOUNTER — Other Ambulatory Visit: Payer: Medicare Other

## 2016-06-04 ENCOUNTER — Ambulatory Visit: Payer: Medicare Other | Admitting: Family

## 2016-06-23 ENCOUNTER — Emergency Department: Payer: 59

## 2016-06-23 ENCOUNTER — Emergency Department
Admission: EM | Admit: 2016-06-23 | Discharge: 2016-06-23 | Disposition: A | Discharge status: Home / Self Care | Payer: 59 | Attending: Emergency Medicine | Admitting: Emergency Medicine

## 2016-06-23 ENCOUNTER — Encounter: Payer: Self-pay | Admitting: Emergency Medicine

## 2016-06-23 DIAGNOSIS — L989 Disorder of the skin and subcutaneous tissue, unspecified: Secondary | ICD-10-CM | POA: Insufficient documentation

## 2016-06-23 DIAGNOSIS — Z79899 Other long term (current) drug therapy: Secondary | ICD-10-CM | POA: Diagnosis not present

## 2016-06-23 DIAGNOSIS — Z87891 Personal history of nicotine dependence: Secondary | ICD-10-CM | POA: Diagnosis not present

## 2016-06-23 DIAGNOSIS — R0602 Shortness of breath: Secondary | ICD-10-CM | POA: Diagnosis present

## 2016-06-23 DIAGNOSIS — R229 Localized swelling, mass and lump, unspecified: Secondary | ICD-10-CM

## 2016-06-23 DIAGNOSIS — I10 Essential (primary) hypertension: Secondary | ICD-10-CM | POA: Insufficient documentation

## 2016-06-23 DIAGNOSIS — F419 Anxiety disorder, unspecified: Secondary | ICD-10-CM | POA: Diagnosis not present

## 2016-06-23 DIAGNOSIS — R202 Paresthesia of skin: Secondary | ICD-10-CM

## 2016-06-23 DIAGNOSIS — Z85038 Personal history of other malignant neoplasm of large intestine: Secondary | ICD-10-CM | POA: Insufficient documentation

## 2016-06-23 LAB — CBC
HCT: 38.7 % (ref 35.0–47.0)
HEMOGLOBIN: 13.2 g/dL (ref 12.0–16.0)
MCH: 28.9 pg (ref 26.0–34.0)
MCHC: 34 g/dL (ref 32.0–36.0)
MCV: 85 fL (ref 80.0–100.0)
Platelets: 329 10*3/uL (ref 150–440)
RBC: 4.56 MIL/uL (ref 3.80–5.20)
RDW: 13.7 % (ref 11.5–14.5)
WBC: 7.3 10*3/uL (ref 3.6–11.0)

## 2016-06-23 LAB — BASIC METABOLIC PANEL
ANION GAP: 7 (ref 5–15)
BUN: 20 mg/dL (ref 6–20)
CO2: 30 mmol/L (ref 22–32)
Calcium: 9.3 mg/dL (ref 8.9–10.3)
Chloride: 100 mmol/L — ABNORMAL LOW (ref 101–111)
Creatinine, Ser: 0.98 mg/dL (ref 0.44–1.00)
GFR calc non Af Amer: >60 mL/min (ref >60)
GLUCOSE: 105 mg/dL — AB (ref 65–99)
Potassium: 4.7 mmol/L (ref 3.5–5.1)
Sodium: 137 mmol/L (ref 135–145)

## 2016-06-23 LAB — TROPONIN I: Troponin I: <0.03 ng/mL (ref <0.03)

## 2016-06-23 LAB — HEPATIC FUNCTION PANEL
ALBUMIN: 4 g/dL (ref 3.5–5.0)
ALK PHOS: 66 U/L (ref 38–126)
ALT: 43 U/L (ref 14–54)
AST: 34 U/L (ref 15–41)
BILIRUBIN TOTAL: 0.4 mg/dL (ref 0.3–1.2)
Total Protein: 7.7 g/dL (ref 6.5–8.1)

## 2016-06-23 LAB — TSH: TSH: 1.309 u[IU]/mL (ref 0.350–4.500)

## 2016-06-23 MED ORDER — DIAZEPAM 2 MG PO TABS
2.0000 mg | ORAL_TABLET | Freq: Once | ORAL | Status: AC
  Administered 2016-06-23: 2 mg via ORAL
  Filled 2016-06-23: qty 1

## 2016-06-23 NOTE — ED Provider Notes (Signed)
[facility] Emergency Department Provider Note  ____________________________________________   First MD Initiated Contact with Patient 06/23/16 1812     (approximate)  I have reviewed the triage vital signs and the nursing notes.   HISTORY  Chief Complaint Anxiety   HPI Sheri Calderon is a 51 y.o. female with a history of anxiety as well as a remote history of colon cancer now in remission was presenting to the emergency department with several months of shortness of breath, paresthesia, dry mouth as well as masses that she has palpated her back as well as her abdomen. She says that the symptoms have been worsening and that she came to the emergency department today for further evaluation.Also concerned about MRSA in her naris that was diagnosed this past December. Does not report any chest pain.   Past Medical History:  Diagnosis Date  . Allergic rhinitis   . Anxiety   . Ascites    acute  . Atrial fibrillation ([place])    resolved - does not see cardiologist  . Colon cancer [facility]) 2014   cancer of the sigmoid colon  . Depression   . Diverticulitis    "this is my 2nd time in hospital w/this in the last 2 wk" (06/21/2012)  . GERD (gastroesophageal reflux disease)   . Gout   . Hydronephrosis   . Hypertension   . Lower extremity edema   . Migraines   . Osteoarthritis   . Peripheral neuropathy   . PTSD (post-traumatic stress disorder)   . Restless legs syndrome   . S/P chemotherapy, time since less than 4 weeks   . Tremor, essential     Patient Active Problem List   Diagnosis Date Noted  . Genetic testing 02/14/2016  . Personal history of sigmoid colon cancer 10/29/2014  . Weight gain 10/29/2014  . Cancer of sigmoid colon ([place]) 08/04/2012  . Restless legs syndrome     Past Surgical History:  Procedure Laterality Date  . APPENDECTOMY    . PARTIAL COLECTOMY N/A 06/28/2012   Procedure: PARTIAL COLECTOMY;  Surgeon: Harl Bowie, MD;   Location: [place];  Service: General;  Laterality: N/A;  . PORTACATH PLACEMENT N/A 07/11/2012   Procedure: INSERTION PORT-A-CATH;  Surgeon: Harl Bowie, MD;  Location: [facility];  Service: General;  Laterality: N/A;  . portacath removal    . TUBAL LIGATION  ~ 1989    Prior to Admission medications   Medication Sig Start Date End Date Taking? Authorizing Provider  busPIRone (BUSPAR) 10 MG tablet Take 10 mg by mouth every evening.    Historical Provider, MD  diazepam (VALIUM) 5 MG tablet Take 1 tablet (5 mg total) by mouth 2 (two) times daily. 01/10/16   Recardo Evangelist, PA-C  DULoxetine (CYMBALTA) 30 MG capsule Take 30 mg by mouth 3 (three) times daily.     Historical Provider, MD  lisinopril (PRINIVIL,ZESTRIL) 5 MG tablet Take 5 mg by mouth daily. 12/28/15   Historical Provider, MD  methadone (DOLOPHINE) 10 MG tablet Take 10 mg by mouth 2 (two) times daily as needed for moderate pain.  10/22/14   Historical Provider, MD  omeprazole (PRILOSEC) 20 MG capsule Take 20 mg by mouth daily.    Historical Provider, MD  ondansetron (ZOFRAN) 4 MG tablet Take 4 mg by mouth 2 (two) times daily.    Historical Provider, MD  oxyCODONE-acetaminophen (PERCOCET) 7.5-325 MG tablet Take 1 tablet by mouth every 6 (six) hours as needed for severe pain.  Historical Provider, MD  potassium chloride (K-DUR) 10 MEQ tablet Take 10 mEq by mouth daily.    Historical Provider, MD  spironolactone (ALDACTONE) 50 MG tablet Take 50 mg by mouth 2 (two) times daily. 10/12/14   Historical Provider, MD    Allergies Lorazepam  Family History  Problem Relation Age of Onset  . Diabetes Mother   . Heart disease Mother   . Stomach cancer Mother   . Ovarian cancer Mother 22  . COPD Father   . Hypertension Father   . Hyperlipidemia Father   . Colon polyps Father     47 lifetime colon polyps  . Down syndrome Paternal Aunt   . Cancer Paternal Uncle     cancer in the spine  . COPD Maternal Grandmother   . Stomach cancer  Paternal Grandmother     Social History Social History  Substance Use Topics  . Smoking status: Former Smoker    Packs/day: 1.00    Years: 15.00    Types: Cigarettes    Start date: 01/17/1991    Quit date: 06/07/2012  . Smokeless tobacco: Never Used     Comment: QUIT 2 YEARS AGO  . Alcohol use No    Review of Systems  Constitutional: No fever/chills Eyes: No visual changes. ENT: No sore throat. Cardiovascular: Denies chest pain. Respiratory: as above Gastrointestinal:  No nausea, no vomiting.  No diarrhea.  No constipation. Genitourinary: Negative for dysuria. Musculoskeletal: Negative for back pain. Skin: Negative for rash. Neurological: Negative for headaches, focal weakness or numbness.   ____________________________________________   PHYSICAL EXAM:  VITAL SIGNS: ED Triage Vitals  Enc Vitals Group     BP 06/23/16 1630 136/67     Pulse Rate 06/23/16 1630 85     Resp 06/23/16 1630 20     Temp 06/23/16 1630 98.1 F (36.7 C)     Temp Source 06/23/16 1630 Oral     SpO2 06/23/16 1630 100 %     Weight 06/23/16 1637 200 lb (90.7 kg)     Height 06/23/16 1637 5' 7"  (1.702 m)     Head Circumference --      Peak Flow --      Pain Score 06/23/16 1636 7     Pain Loc --      Pain Edu? --      Excl. in [place]? --     Constitutional: Alert and oriented. Well appearing and in no acute distress. Eyes: Conjunctivae are normal. PERRL. EOMI. Head: Atraumatic. Nose: No congestion/rhinnorhea. Mouth/Throat: Mucous membranes are moist.   Neck: No stridor.   Cardiovascular: Normal rate, regular rhythm. Grossly normal heart sounds.   Respiratory: Normal respiratory effort.  No retractions. Lungs CTAB. Gastrointestinal: Soft and nontender. No distention.   Musculoskeletal: No lower extremity tenderness nor edema.  No joint effusions. Neurologic:  Normal speech and language. No gross focal neurologic deficits are appreciated. No gait instability. Sensation is intact to light touch to  the bilateral sides of the face. Skin:  Skin is warm, dry and intact. No rash noted.  Several millimeter nodule to the epigastrium which is mobile and minimally tender. No overlying erythema or warmth.  2 cm, oval shaped mobile and soft mass to the right lateral back which is minimally tender. Again without any overlying erythema or warmth.  Psychiatric: Mood and affect are normal. Speech and behavior are normal.  ____________________________________________   LABS (all labs ordered are listed, but only abnormal results are displayed)  Labs Reviewed  BASIC  METABOLIC PANEL - Abnormal; Notable for the following:       Result Value   Chloride 100 (*)    Glucose, Bld 105 (*)    All other components within normal limits  HEPATIC FUNCTION PANEL - Abnormal; Notable for the following:    Bilirubin, Direct <0.1 (*)    All other components within normal limits  CBC  TROPONIN I  TSH   ____________________________________________  EKG  ED ECG REPORT I, Doran Stabler, the attending physician, personally viewed and interpreted this ECG.   Date: 06/23/2016  EKG Time: 1647  Rate: 92  Rhythm: normal sinus rhythm  Axis: normal  Intervals:none  ST&T Change: no st segment elevation or depression. No abnormal T-wave inversion.  ____________________________________________  [place] Chest 2 View (Accession [id number]) (Order [id number])  Imaging  Date: 06/23/2016 Department: [facility] Released By: Gladstone Pih, RN (auto-released) Authorizing: Orbie Pyo, MD  Exam Information   Status Exam Begun  Exam Ended   Final [99] 06/23/2016 5:00 PM 06/23/2016 5:11 PM  PACS Images   Show images for DG Chest 2 View  Study Result   CLINICAL DATA:  51 y/o  F; chest pain.  EXAM: CHEST  2 VIEW  COMPARISON:  01/10/2016 chest radiograph  FINDINGS: Stable normal cardiac silhouette. Persistent linear opacity in the right  mid lung zone opacity probably represents scarring. No focal consolidation, effusion, or pneumothorax. No acute osseous abnormality is evident.  IMPRESSION: No active cardiopulmonary disease.   Electronically Signed   By: Kristine Garbe M.D.   On: 06/23/2016 17:16     ____________________________________________   PROCEDURES  Procedure(s) performed:   Procedures  Critical Care performed:   ____________________________________________   INITIAL IMPRESSION / ASSESSMENT AND PLAN / ED COURSE  Pertinent labs & imaging results that were available during my care of the patient were reviewed by me and considered in my medical decision making (see chart for details).  ----------------------------------------- 8:27 PM on 06/23/2016 ----------------------------------------- Patient with reassuring EKG as well as lab work. Patient says that symptoms may be related to anxiety because she has had similar symptoms in the past with anxiety and stress. Said that she has been under more stress over the past week. Did not report any suicidal or homicidal ideation. Has no blood with his primary with primary care doctor. Says that she is also going to follow up with her oncologist and would rather have imaging done there to rule out any recurrence of the cancer. The mass is palpated on the abdomen as well as the back are not completely classic for metastatic disease. First of all, would be unusual to have skin metastases. However, the masses are soft and mobile as well. Patient also reports a 70 pound weight gain over the past year. Also says that she has follow-up with pain management "early next month." Did ask for refill of anti-anxiety medications. However, because the patient had a spinal 120 Percocets in late March do not want to give a sedative hypnotic on top of this because a field accommodation the medications could be dangerous. This the patient as well as her husband and  they're understanding when to comply. Will be discharged home.      ____________________________________________   FINAL CLINICAL IMPRESSION(S) / ED DIAGNOSES  Paresthesia. Shortness of breath. Nodules.    NEW MEDICATIONS STARTED DURING THIS VISIT:  Discharge Medication List as of 06/23/2016  8:08 PM  Note:  This document was prepared using Dragon voice recognition software and may include unintentional dictation errors.    Orbie Pyo, MD 06/23/16 2029

## 2016-06-23 NOTE — ED Notes (Signed)
Spoke with Dr. Clearnce Hasten in regards to patients presentation. See orders.

## 2016-06-23 NOTE — ED Notes (Signed)
Report given to Kailey, RN.  

## 2016-06-23 NOTE — ED Notes (Signed)
Pt resting in bed, registration at bedside at this time. Pt's SO at bedside as well. Pt visualized in NAD at this time. Will continue to monitor for further patient needs.

## 2016-06-23 NOTE — ED Triage Notes (Signed)
Patient presents to ED via POV with multiple complaints. Patient states she hasn't felt well since December. Pressured speech noted. Patient c/o shortness of breath, tingling to face, abdominal pain, increased thirst, increased fatigue, hot flashes. Patient states she was dx with MRSA in the nares in December and would like that checked out. When asked if she had to pick one, patient states, "the MRSA". A&O x4. Speak in full complete sentences without difficulty.

## 2016-06-23 NOTE — ED Notes (Signed)
Pt became [facility] while walking with this RN back to room, requested wheelchair to get her to the room. Pt taken to room via wheelchair, pt ambulatory without difficulty to the bathroom once in the room.

## 2016-09-07 ENCOUNTER — Emergency Department (HOSPITAL_COMMUNITY)
Admission: EM | Admit: 2016-09-07 | Discharge: 2016-09-07 | Disposition: A | Discharge status: Home / Self Care | Payer: 59 | Attending: Emergency Medicine | Admitting: Emergency Medicine

## 2016-09-07 ENCOUNTER — Encounter (HOSPITAL_COMMUNITY): Payer: Self-pay

## 2016-09-07 DIAGNOSIS — M545 Low back pain: Secondary | ICD-10-CM | POA: Diagnosis present

## 2016-09-07 NOTE — ED Notes (Signed)
Pt stated, I don't think I can wait any longer, Im going to call my primary doctor. My back is hurting too bad.

## 2016-09-07 NOTE — ED Triage Notes (Signed)
Pt endorses lumbar pain x 1 week since she tripped and fell over her dog at home. Pt ambulatory. Also endorses difficulty holding bladder. Pt last took oxycodone at 1030 this morning. Pt hypertensive in triage.

## 2016-11-09 ENCOUNTER — Other Ambulatory Visit: Payer: Self-pay | Admitting: Family

## 2016-11-09 DIAGNOSIS — R1084 Generalized abdominal pain: Secondary | ICD-10-CM

## 2016-11-09 DIAGNOSIS — C187 Malignant neoplasm of sigmoid colon: Secondary | ICD-10-CM

## 2016-11-10 ENCOUNTER — Ambulatory Visit (HOSPITAL_BASED_OUTPATIENT_CLINIC_OR_DEPARTMENT_OTHER)
Admission: RE | Admit: 2016-11-10 | Discharge: 2016-11-10 | Disposition: A | Discharge status: Home / Self Care | Payer: Medicare Other | Source: Ambulatory Visit | Attending: Family | Admitting: Family

## 2016-11-10 ENCOUNTER — Other Ambulatory Visit (HOSPITAL_BASED_OUTPATIENT_CLINIC_OR_DEPARTMENT_OTHER): Payer: Medicare Other

## 2016-11-10 ENCOUNTER — Ambulatory Visit (HOSPITAL_BASED_OUTPATIENT_CLINIC_OR_DEPARTMENT_OTHER): Payer: Medicare Other | Admitting: Family

## 2016-11-10 ENCOUNTER — Other Ambulatory Visit: Payer: Self-pay | Admitting: Family

## 2016-11-10 DIAGNOSIS — R1084 Generalized abdominal pain: Secondary | ICD-10-CM | POA: Insufficient documentation

## 2016-11-10 DIAGNOSIS — K760 Fatty (change of) liver, not elsewhere classified: Secondary | ICD-10-CM | POA: Insufficient documentation

## 2016-11-10 DIAGNOSIS — K529 Noninfective gastroenteritis and colitis, unspecified: Secondary | ICD-10-CM

## 2016-11-10 DIAGNOSIS — C187 Malignant neoplasm of sigmoid colon: Secondary | ICD-10-CM

## 2016-11-10 DIAGNOSIS — Z85038 Personal history of other malignant neoplasm of large intestine: Secondary | ICD-10-CM

## 2016-11-10 LAB — CBC WITH DIFFERENTIAL (CANCER CENTER ONLY)
BASO#: 0 10*3/uL (ref 0.0–0.2)
BASO%: 0.6 % (ref 0.0–2.0)
EOS ABS: 0.2 10*3/uL (ref 0.0–0.5)
EOS%: 2.7 % (ref 0.0–7.0)
HCT: 40.1 % (ref 34.8–46.6)
HGB: 13.2 g/dL (ref 11.6–15.9)
LYMPH#: 1 10*3/uL (ref 0.9–3.3)
LYMPH%: 15.1 % (ref 14.0–48.0)
MCH: 29.2 pg (ref 26.0–34.0)
MCHC: 32.9 g/dL (ref 32.0–36.0)
MCV: 89 fL (ref 81–101)
MONO#: 0.4 10*3/uL (ref 0.1–0.9)
MONO%: 5.8 % (ref 0.0–13.0)
NEUT#: 5 10*3/uL (ref 1.5–6.5)
NEUT%: 75.8 % (ref 39.6–80.0)
PLATELETS: 344 10*3/uL (ref 145–400)
RBC: 4.52 10*6/uL (ref 3.70–5.32)
RDW: 13.3 % (ref 11.1–15.7)
WBC: 6.6 10*3/uL (ref 3.9–10.0)

## 2016-11-10 LAB — CMP (CANCER CENTER ONLY)
ALT(SGPT): 38 U/L (ref 10–47)
AST: 32 U/L (ref 11–38)
Albumin: 3.3 g/dL (ref 3.3–5.5)
Alkaline Phosphatase: 66 U/L (ref 26–84)
BUN, Bld: 9 mg/dL (ref 7–22)
CHLORIDE: 103 meq/L (ref 98–108)
CO2: 30 meq/L (ref 18–33)
Calcium: 9.8 mg/dL (ref 8.0–10.3)
Creat: 1.1 mg/dl (ref 0.6–1.2)
GLUCOSE: 111 mg/dL (ref 73–118)
POTASSIUM: 4.5 meq/L (ref 3.3–4.7)
Sodium: 143 mEq/L (ref 128–145)
TOTAL PROTEIN: 7.4 g/dL (ref 6.4–8.1)
Total Bilirubin: 0.5 mg/dl (ref 0.20–1.60)

## 2016-11-10 MED ORDER — IOPAMIDOL (ISOVUE-300) INJECTION 61%
100.0000 mL | Freq: Once | INTRAVENOUS | Status: AC | PRN
  Administered 2016-11-10: 100 mL via INTRAVENOUS

## 2016-11-10 MED ORDER — METHYLPREDNISOLONE 4 MG PO TBPK: ORAL_TABLET | ORAL | 0 refills | Status: DC

## 2016-11-10 MED FILL — METHYLPREDNISOLONE 4 MG TAB: 4 | 6 days supply | Qty: 21 | Fill #0

## 2016-11-10 NOTE — Progress Notes (Addendum)
Hematology and Oncology Follow Up Visit  Sheri Calderon [id number] [birth year] 52 y.o. 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 She is tearful and states that she is having severe abdominal pain. Her bowel sounds are decreased. She states that she has had diarrhea and nausea with vomiting when she tries to eat. Abdominal x-ray today showed no evidence of obstruction. We will get a CT of the abdomen to further assess.  She states that her feet feel "puffy" and she feels bloated and "tight" in her abdomen.  Her abdomen is firm, distended and tender on palpation.  She has had some SOB with COPD and exacerbated buy the abdominal pain at times. She has no appetite but has tried to hydrate.  She denies fever, chills, cough, rash, dizziness, chest pain, palpitations, or changes in bladder habits.  No tenderness, numbness or tingling in her extremities. No visible swelling in her extremities.   ECOG Performance Status: 1 - Symptomatic but completely ambulatory  Medications:  Allergies as of 11/10/2016      Reactions   Lorazepam Hives, Swelling   Patient reports receiving lorazepam intensol in the hospital and experienced swelling with hives.      Medication List       Accurate as of 11/10/16  3:08 PM. Always use your most recent med list.          busPIRone 10 MG tablet Commonly known as:  BUSPAR Take 10 mg by mouth every evening.   diazepam 5 MG tablet Commonly known as:  VALIUM Take 1 tablet (5 mg total) by mouth 2 (two) times daily.   DULoxetine 30 MG capsule Commonly known as:  CYMBALTA Take 30 mg by mouth 3 (three) times daily.   lisinopril 5 MG tablet Commonly known as:  PRINIVIL,ZESTRIL Take 5 mg by mouth daily.   methadone 10 MG tablet Commonly known as:  DOLOPHINE Take 10 mg by mouth 2 (two) times daily as needed for  moderate pain.   omeprazole 20 MG capsule Commonly known as:  PRILOSEC Take 20 mg by mouth daily.   ondansetron 4 MG tablet Commonly known as:  ZOFRAN Take 4 mg by mouth 2 (two) times daily.   oxyCODONE-acetaminophen 7.5-325 MG tablet Commonly known as:  PERCOCET Take 1 tablet by mouth every 6 (six) hours as needed for severe pain.   potassium chloride 10 MEQ tablet Commonly known as:  K-DUR Take 10 mEq by mouth daily.   spironolactone 50 MG tablet Commonly known as:  ALDACTONE Take 50 mg by mouth 2 (two) times daily.            Discharge Care Instructions        Start     Ordered   11/10/16 1332  CMP STAT ([facility] only)     11/10/16 1332   Unscheduled  DG Abd 2 Views    Question Answer Comment  Reason for Exam (SYMPTOM  OR DIAGNOSIS REQUIRED) abdominal pain, n/v, hsitory of colon cancer   Is patient pregnant? No   Preferred imaging location? [facility]   Radiology Contrast Protocol - do NOT remove file path [email or web address]      11/10/16 1341      Allergies:  Allergies  Allergen Reactions  . Lorazepam Hives and Swelling    Patient reports receiving lorazepam intensol in the  hospital and experienced swelling with hives.    Past Medical History, Surgical history, Social history, and Family History were reviewed and updated.  Review of Systems: All other 10 point review of systems is negative.   Physical Exam:  vitals were not taken for this visit.  Wt Readings from Last 3 Encounters:  09/07/16 210 lb (95.3 kg)  06/23/16 200 lb (90.7 kg)  01/10/16 257 lb (116.6 kg)    Ocular: Sclerae unicteric, pupils equal, round and reactive to light Ear-nose-throat: Oropharynx clear, dentition fair Lymphatic: No cervical, supraclavicular or axillary adenopathy Lungs no rales or rhonchi, good excursion bilaterally Heart regular rate and rhythm, no murmur appreciated Abd firm, distended, tender  on exam, bowel sound diminished, unable to palpate for liver or spleen distention due to guarding, no fluid wave  MSK no focal spinal tenderness, no joint edema Neuro: non-focal, well-oriented, appropriate affect Breasts: Deferred   Lab Results  Component Value Date   WBC 6.6 11/10/2016   HGB 13.2 11/10/2016   HCT 40.1 11/10/2016   MCV 89 11/10/2016   PLT 344 11/10/2016   No results found for: FERRITIN, IRON, TIBC, UIBC, IRONPCTSAT Lab Results  Component Value Date   RBC 4.52 11/10/2016   No results found for: KPAFRELGTCHN, LAMBDASER, KAPLAMBRATIO No results found for: IGGSERUM, IGA, IGMSERUM No results found for: Odetta Pink, SPEI   Chemistry      Component Value Date/Time   NA 143 11/10/2016 1332   NA 141 11/25/2015 1249   K 4.5 11/10/2016 1332   K 4.5 11/25/2015 1249   CL 103 11/10/2016 1332   CL 105 08/11/2012 0941   CO2 30 11/10/2016 1332   CO2 25 11/25/2015 1249   BUN 9 11/10/2016 1332   BUN 17.7 11/25/2015 1249   CREATININE 1.1 11/10/2016 1332   CREATININE 1.1 11/25/2015 1249      Component Value Date/Time   CALCIUM 9.8 11/10/2016 1332   CALCIUM 9.4 11/25/2015 1249   ALKPHOS 66 11/10/2016 1332   ALKPHOS 77 11/25/2015 1249   AST 32 11/10/2016 1332   AST 15 11/25/2015 1249   ALT 38 11/10/2016 1332   ALT 26 11/25/2015 1249   BILITOT 0.50 11/10/2016 1332   BILITOT <0.30 11/25/2015 1249      Impression and Plan: Sheri Calderon is a pleasant 51 yo caucasian female with history of stage IIIC colon cancer. She had a sigmoid colectomy in April 2014 followed by radiation and chemo completed that following November. She is here today having severe abdominal pain. Abdominal xray is negative for obstruction. We will get an abdominal CT today to further assess.  She is currently in radiology having her CT. We will see what this result shows and send her to the ED if needed.  She is in agreement with the plan. We will  follow-up with her once we have these results.   Eliezer Bottom, NP 9/11/20183:08 PM    Addendum: CT scan showed possible colitis so we will have her start a medrol dose pack and Flagyl PO TID for 10 days. She is in agreement with the plan and we will plan to see her back in another 3 weeks for follow-up.

## 2016-11-11 ENCOUNTER — Telehealth: Payer: Self-pay | Admitting: *Deleted

## 2016-11-11 DIAGNOSIS — C187 Malignant neoplasm of sigmoid colon: Secondary | ICD-10-CM

## 2016-11-11 DIAGNOSIS — R1084 Generalized abdominal pain: Secondary | ICD-10-CM

## 2016-11-11 DIAGNOSIS — K529 Noninfective gastroenteritis and colitis, unspecified: Secondary | ICD-10-CM

## 2016-11-11 LAB — CEA (IN HOUSE-CHCC): CEA (CHCC-IN HOUSE): 1.93 ng/mL (ref 0.00–5.00)

## 2016-11-11 MED ORDER — METRONIDAZOLE 500 MG PO TABS: 500.0000 mg | ORAL_TABLET | Freq: Three times a day (TID) | ORAL | 0 refills | Status: DC

## 2016-11-11 NOTE — Telephone Encounter (Addendum)
Patient is aware of prescription.   ----- Message from Eliezer Bottom, NP sent at 11/11/2016  8:29 AM EDT ----- She will also need to take Flagyl 500 mg PO TID for 10 days. Prescription sent to her [place] on file. Thank you!  Sarah  ----- Message ----- From: Buel Ream, Rad Results In Sent: 11/10/2016   4:39 PM To: Eliezer Bottom, NP

## 2016-11-11 NOTE — Addendum Note (Signed)
Addended by: Eliezer Bottom on: 11/11/2016 08:32 AM   Modules accepted: Orders

## 2016-11-16 ENCOUNTER — Other Ambulatory Visit: Payer: Self-pay

## 2016-11-16 MED ORDER — RADIAPLEXRX EX GEL: 1.0000 "application " | Freq: Three times a day (TID) | CUTANEOUS | 3 refills | Status: DC

## 2016-11-17 ENCOUNTER — Telehealth: Payer: Self-pay | Admitting: Internal Medicine

## 2016-11-17 NOTE — Telephone Encounter (Signed)
Patient with colon cancer.  She has been having abdomina pain and some changes in bowel habits.  She will come in and see Dr. Carlean Purl tomorrow at 10:00.  She did have a CT last week that showed thickening of colonic wall, possible colitis

## 2016-11-18 ENCOUNTER — Ambulatory Visit: Payer: Medicare Other | Admitting: Internal Medicine

## 2016-12-15 ENCOUNTER — Other Ambulatory Visit: Payer: Self-pay | Admitting: Family

## 2016-12-24 ENCOUNTER — Other Ambulatory Visit: Payer: Medicare Other

## 2016-12-24 ENCOUNTER — Ambulatory Visit: Payer: Medicare Other | Admitting: Family

## 2017-04-22 ENCOUNTER — Telehealth: Payer: Self-pay | Admitting: Hematology & Oncology

## 2017-04-22 NOTE — Telephone Encounter (Signed)
Faxed medical records to: [facility] CASE: [id number] 12/13/2015 to present F: [phone]     COPY SCANNED

## 2017-05-06 ENCOUNTER — Ambulatory Visit: Payer: Medicare Other | Admitting: Nurse Practitioner

## 2017-05-06 NOTE — Progress Notes (Deleted)
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   LABPROT 14.8 06/22/2012   APTT 23 SPECIMEN CHECKED FOR CLOTS REPEATED TO VERIFY (L) 06/01/2009   PLT 344 11/10/2016   Cardiovascular Markers Lab Results  Component Value Date   HGB 13.2 11/10/2016   HCT 40.1 11/10/2016   Note: Lab results reviewed.  PFSH  Drug: Ms. Azucena  reports that she does not use drugs. Alcohol:  reports that she does not drink alcohol. Tobacco:  reports that she has been smoking cigarettes.  She started smoking about 26 years ago. She has a 3.00 pack-year smoking history. she has never used smokeless tobacco. Medical:  has a past medical history of Allergic rhinitis, Anxiety, Ascites, Atrial fibrillation (HCC), Colon cancer ([place]) (2014), Depression, Diverticulitis, GERD (gastroesophageal reflux disease), Gout, Hydronephrosis, Hypertension, Lower extremity edema, Migraines, Osteoarthritis, Peripheral neuropathy, PTSD (post-traumatic stress disorder), Restless legs syndrome, S/P chemotherapy, time since less than 4 weeks, and Tremor, essential. Family: family  history includes COPD in her father and maternal grandmother; Cancer in her paternal uncle; Colon polyps in her father; Diabetes in her mother; Down syndrome in  her paternal aunt; Heart disease in her mother; Hyperlipidemia in her father; Hypertension in her father; Ovarian cancer (age of onset: 35) in her mother; Stomach cancer in her mother and paternal grandmother.  Past Surgical History:  Procedure Laterality Date  . APPENDECTOMY    . PARTIAL COLECTOMY N/A 06/28/2012   Procedure: PARTIAL COLECTOMY;  Surgeon: Harl Bowie, MD;  Location: [facility];  Service: General;  Laterality: N/A;  . PORTACATH PLACEMENT N/A 07/11/2012   Procedure: INSERTION PORT-A-CATH;  Surgeon: Harl Bowie, MD;  Location: [facility];  Service: General;  Laterality: N/A;  . portacath removal    . TUBAL LIGATION  ~ 1989   Active Ambulatory Problems    Diagnosis Date Noted  . Restless legs syndrome   . Cancer of sigmoid colon ([place]) 08/04/2012  . Personal history of sigmoid colon cancer 10/29/2014  . Weight gain 10/29/2014  . Genetic testing 02/14/2016   Resolved Ambulatory Problems    Diagnosis Date Noted  . Abdominal pain 06/08/2012  . Diverticulitis 06/08/2012  . Hyponatremia 06/08/2012  . Leukocytosis 06/08/2012   Past Medical History:  Diagnosis Date  . Allergic rhinitis   . Anxiety   . Ascites   . Atrial fibrillation ([place])   . Colon cancer ([place]) 2014  . Depression   . Diverticulitis   . GERD (gastroesophageal reflux disease)   . Gout   . Hydronephrosis   . Hypertension   . Lower extremity edema   . Migraines   . Osteoarthritis   . Peripheral neuropathy   . PTSD (post-traumatic stress disorder)   . Restless legs syndrome   . S/P chemotherapy, time since less than 4 weeks   . Tremor, essential    Constitutional Exam  General appearance: Well nourished, well developed, and well hydrated. In no apparent acute distress There were no vitals filed for this visit. BMI Assessment: Estimated  body mass index is 32.89 kg/m as calculated from the following:   Height as of 09/07/16: 5' 7" (1.702 m).   Weight as of 09/07/16: 210 lb (95.3 kg).  BMI interpretation table: BMI level Category Range association with higher incidence of chronic pain  <18 kg/m2 Underweight   18.5-24.9 kg/m2 Ideal body weight   25-29.9 kg/m2 Overweight Increased incidence by 20%  30-34.9 kg/m2 Obese (Class I) Increased incidence by 68%  35-39.9 kg/m2 Severe obesity (Class II) Increased incidence by 136%  >40 kg/m2 Extreme obesity (Class III) Increased incidence by 254%   BMI Readings from Last 4 Encounters:  06/23/16 31.32 kg/m  01/10/16 40.25 kg/m  11/25/15 35.08 kg/m  12/28/14 32.89 kg/m   Wt Readings from Last 4 Encounters:  06/23/16 200 lb (90.7 kg)  01/10/16 257 lb (116.6 kg)  11/25/15 224 lb (101.6 kg)  12/28/14 210 lb (95.3 kg)  Psych/Mental status: Alert, oriented x 3 (person, place, & time)       Eyes: PERLA Respiratory: No evidence of acute respiratory distress  Cervical Spine Exam  Inspection: No masses, redness, or swelling Alignment: Symmetrical Functional ROM: Unrestricted ROM      Stability: No instability detected Muscle strength & Tone: Functionally intact Sensory: Unimpaired Palpation: No palpable anomalies              Upper Extremity (UE) Exam    Side: Right upper extremity  Side: Left upper extremity  Inspection: No masses, redness, swelling, or asymmetry. No contractures  Inspection: No masses, redness, swelling, or asymmetry. No contractures  Functional ROM: Unrestricted ROM  Functional ROM: Unrestricted ROM          Muscle strength & Tone: Functionally intact  Muscle strength & Tone: Functionally intact  Sensory: Unimpaired  Sensory: Unimpaired  Palpation: No palpable anomalies              Palpation: No palpable anomalies              Specialized Test(s): Deferred         Specialized Test(s): Deferred          Thoracic Spine Exam  Inspection: No masses,  redness, or swelling Alignment: Symmetrical Functional ROM: Unrestricted ROM Stability: No instability detected Sensory: Unimpaired Muscle strength & Tone: No palpable anomalies  Lumbar Spine Exam  Inspection: No masses, redness, or swelling Alignment: Symmetrical Functional ROM: Unrestricted ROM      Stability: No instability detected Muscle strength & Tone: Functionally intact Sensory: Unimpaired Palpation: No palpable anomalies       Provocative Tests: Lumbar Hyperextension and rotation test: evaluation deferred today       Patrick's Maneuver: evaluation deferred today                    Gait & Posture Assessment  Ambulation: Unassisted Gait: Relatively normal for age and body habitus Posture: WNL   Lower Extremity Exam    Side: Right lower extremity  Side: Left lower extremity  Inspection: No masses, redness, swelling, or asymmetry. No contractures  Inspection: No masses, redness, swelling, or asymmetry. No contractures  Functional ROM: Unrestricted ROM          Functional ROM: Unrestricted ROM          Muscle strength & Tone: Functionally intact  Muscle strength & Tone: Functionally intact  Sensory: Unimpaired  Sensory: Unimpaired  Palpation: No palpable anomalies  Palpation: No palpable anomalies   Assessment  Primary Diagnosis & Pertinent Problem List: There were no encounter diagnoses.  Visit Diagnosis: No diagnosis found. Plan of Care  Initial treatment plan:  Please be advised that as per protocol, today's visit has been an evaluation only. We have not taken over the patient's controlled substance management.  Problem-specific plan: No problem-specific Assessment & Plan notes found for this encounter.  Ordered Lab-work, Procedure(s), Referral(s), & Consult(s): No orders of the defined types were placed in this encounter.  Pharmacotherapy: Medications ordered:  No orders of the defined types were placed in this encounter.  Medications administered during  this visit: Georgeana Oertel. Group had no medications administered during this visit.   Pharmacotherapy under consideration:  Opioid Analgesics: The patient was informed that there is no guarantee that she would be a candidate for opioid analgesics. The decision will be made following CDC guidelines. This decision will be based on the results of diagnostic studies, as well as Ms. Peckenpaugh's risk profile.  Membrane stabilizer: To be determined at a later time Muscle relaxant: To be determined at a later time NSAID: To be determined at a later time Other analgesic(s): To be determined at a later time   Interventional therapies under consideration: Ms. Stehr was informed that there is no guarantee that she would be a candidate for interventional therapies. The decision will be based on the results of diagnostic studies, as well as Ms. Losier's risk profile.  Possible procedure(s): ***   Provider-requested follow-up: No Follow-up on file.  Future Appointments  Date Time Provider [place]  05/06/2017 10:00 AM Vevelyn Francois, NP [facility] None    Primary Care Physician: Alvester Chou,  NP Location: [facility] Note by:  Date: 05/06/2017; Time: 9:32 AM  Pain Score Disclaimer: We use the NRS-11 scale. This is a self-reported, subjective measurement of pain severity with only modest accuracy. It is used primarily to identify changes within a particular patient. It must be understood that outpatient pain scales are significantly less accurate that those used for research, where they can be applied under ideal controlled circumstances with minimal exposure to variables. In reality, the score is likely to be a combination of pain intensity and pain affect, where pain affect describes the degree of emotional arousal or changes in action readiness caused by the sensory experience of pain. Factors such as social and work situation, setting, emotional state, anxiety levels,  expectation, and prior pain experience may influence pain perception and show large inter-individual differences that may also be affected by time variables.  Patient instructions provided during this appointment: There are no Patient Instructions on file for this visit.

## 2017-07-29 ENCOUNTER — Inpatient Hospital Stay
Admission: EM | Admit: 2017-07-29 | Discharge: 2017-08-01 | DRG: 392 | Disposition: A | Discharge status: Home / Self Care | Payer: Commercial Managed Care - PPO | Attending: Internal Medicine | Admitting: Internal Medicine

## 2017-07-29 ENCOUNTER — Emergency Department: Payer: Commercial Managed Care - PPO

## 2017-07-29 DIAGNOSIS — Z923 Personal history of irradiation: Secondary | ICD-10-CM

## 2017-07-29 DIAGNOSIS — K529 Noninfective gastroenteritis and colitis, unspecified: Principal | ICD-10-CM | POA: Diagnosis present

## 2017-07-29 DIAGNOSIS — Z85038 Personal history of other malignant neoplasm of large intestine: Secondary | ICD-10-CM

## 2017-07-29 DIAGNOSIS — I10 Essential (primary) hypertension: Secondary | ICD-10-CM | POA: Diagnosis present

## 2017-07-29 DIAGNOSIS — K621 Rectal polyp: Secondary | ICD-10-CM | POA: Diagnosis present

## 2017-07-29 DIAGNOSIS — K5090 Crohn's disease, unspecified, without complications: Secondary | ICD-10-CM | POA: Diagnosis present

## 2017-07-29 DIAGNOSIS — F17210 Nicotine dependence, cigarettes, uncomplicated: Secondary | ICD-10-CM | POA: Diagnosis present

## 2017-07-29 DIAGNOSIS — Z9049 Acquired absence of other specified parts of digestive tract: Secondary | ICD-10-CM

## 2017-07-29 DIAGNOSIS — I480 Paroxysmal atrial fibrillation: Secondary | ICD-10-CM | POA: Diagnosis present

## 2017-07-29 DIAGNOSIS — R112 Nausea with vomiting, unspecified: Secondary | ICD-10-CM

## 2017-07-29 DIAGNOSIS — Z888 Allergy status to other drugs, medicaments and biological substances status: Secondary | ICD-10-CM

## 2017-07-29 DIAGNOSIS — Y92009 Unspecified place in unspecified non-institutional (private) residence as the place of occurrence of the external cause: Secondary | ICD-10-CM

## 2017-07-29 DIAGNOSIS — Z8041 Family history of malignant neoplasm of ovary: Secondary | ICD-10-CM

## 2017-07-29 DIAGNOSIS — D122 Benign neoplasm of ascending colon: Secondary | ICD-10-CM | POA: Diagnosis present

## 2017-07-29 DIAGNOSIS — K219 Gastro-esophageal reflux disease without esophagitis: Secondary | ICD-10-CM | POA: Diagnosis present

## 2017-07-29 DIAGNOSIS — Z8371 Family history of colonic polyps: Secondary | ICD-10-CM

## 2017-07-29 DIAGNOSIS — Z825 Family history of asthma and other chronic lower respiratory diseases: Secondary | ICD-10-CM

## 2017-07-29 DIAGNOSIS — Z833 Family history of diabetes mellitus: Secondary | ICD-10-CM

## 2017-07-29 DIAGNOSIS — T39315A Adverse effect of propionic acid derivatives, initial encounter: Secondary | ICD-10-CM | POA: Diagnosis present

## 2017-07-29 DIAGNOSIS — R1084 Generalized abdominal pain: Secondary | ICD-10-CM

## 2017-07-29 DIAGNOSIS — Z800 Family history of malignant neoplasm of digestive organs: Secondary | ICD-10-CM

## 2017-07-29 DIAGNOSIS — R109 Unspecified abdominal pain: Secondary | ICD-10-CM

## 2017-07-29 DIAGNOSIS — R635 Abnormal weight gain: Secondary | ICD-10-CM

## 2017-07-29 DIAGNOSIS — D123 Benign neoplasm of transverse colon: Secondary | ICD-10-CM | POA: Diagnosis present

## 2017-07-29 DIAGNOSIS — F329 Major depressive disorder, single episode, unspecified: Secondary | ICD-10-CM | POA: Diagnosis present

## 2017-07-29 DIAGNOSIS — F419 Anxiety disorder, unspecified: Secondary | ICD-10-CM | POA: Diagnosis present

## 2017-07-29 DIAGNOSIS — Z8349 Family history of other endocrine, nutritional and metabolic diseases: Secondary | ICD-10-CM

## 2017-07-29 DIAGNOSIS — N281 Cyst of kidney, acquired: Secondary | ICD-10-CM | POA: Diagnosis present

## 2017-07-29 DIAGNOSIS — Z9221 Personal history of antineoplastic chemotherapy: Secondary | ICD-10-CM

## 2017-07-29 DIAGNOSIS — Z8249 Family history of ischemic heart disease and other diseases of the circulatory system: Secondary | ICD-10-CM

## 2017-07-29 LAB — URINALYSIS, COMPLETE (UACMP) WITH MICROSCOPIC
BILIRUBIN URINE: NEGATIVE
Bacteria, UA: NONE SEEN
GLUCOSE, UA: NEGATIVE mg/dL
HGB URINE DIPSTICK: NEGATIVE
Ketones, ur: NEGATIVE mg/dL
NITRITE: NEGATIVE
PH: 5 (ref 5.0–8.0)
Protein, ur: 30 mg/dL — AB
SPECIFIC GRAVITY, URINE: 1.029 (ref 1.005–1.030)

## 2017-07-29 LAB — CBC
HEMATOCRIT: 39.7 % (ref 35.0–47.0)
Hemoglobin: 13.5 g/dL (ref 12.0–16.0)
MCH: 29.1 pg (ref 26.0–34.0)
MCHC: 34.1 g/dL (ref 32.0–36.0)
MCV: 85.4 fL (ref 80.0–100.0)
Platelets: 406 10*3/uL (ref 150–440)
RBC: 4.64 MIL/uL (ref 3.80–5.20)
RDW: 13.5 % (ref 11.5–14.5)
WBC: 11.3 10*3/uL — AB (ref 3.6–11.0)

## 2017-07-29 LAB — COMPREHENSIVE METABOLIC PANEL
ALK PHOS: 63 U/L (ref 38–126)
ALT: 39 U/L (ref 14–54)
ANION GAP: 12 (ref 5–15)
AST: 33 U/L (ref 15–41)
Albumin: 4.5 g/dL (ref 3.5–5.0)
BILIRUBIN TOTAL: 0.4 mg/dL (ref 0.3–1.2)
BUN: 14 mg/dL (ref 6–20)
CALCIUM: 9.3 mg/dL (ref 8.9–10.3)
CO2: 22 mmol/L (ref 22–32)
Chloride: 103 mmol/L (ref 101–111)
Creatinine, Ser: 0.92 mg/dL (ref 0.44–1.00)
GFR calc Af Amer: >60 mL/min (ref >60)
GLUCOSE: 120 mg/dL — AB (ref 65–99)
POTASSIUM: 3.6 mmol/L (ref 3.5–5.1)
Sodium: 137 mmol/L (ref 135–145)
TOTAL PROTEIN: 8.3 g/dL — AB (ref 6.5–8.1)

## 2017-07-29 LAB — LIPASE, BLOOD: Lipase: 26 U/L (ref 11–51)

## 2017-07-29 MED ORDER — IOPAMIDOL (ISOVUE-300) INJECTION 61%
30.0000 mL | Freq: Once | INTRAVENOUS | Status: AC
  Administered 2017-07-29: 30 mL via ORAL

## 2017-07-29 MED ORDER — ONDANSETRON HCL 4 MG/2ML IJ SOLN
4.0000 mg | Freq: Once | INTRAMUSCULAR | Status: AC | PRN
  Administered 2017-07-29: 4 mg via INTRAVENOUS
  Filled 2017-07-29: qty 2

## 2017-07-29 MED ORDER — IOPAMIDOL (ISOVUE-300) INJECTION 61%
100.0000 mL | Freq: Once | INTRAVENOUS | Status: AC | PRN
  Administered 2017-07-29: 100 mL via INTRAVENOUS

## 2017-07-29 MED ORDER — PROMETHAZINE HCL 25 MG/ML IJ SOLN
25.0000 mg | Freq: Once | INTRAMUSCULAR | Status: AC
  Administered 2017-07-29: 25 mg via INTRAVENOUS
  Filled 2017-07-29: qty 1

## 2017-07-29 MED ORDER — SODIUM CHLORIDE 0.9 % IV BOLUS
1000.0000 mL | Freq: Once | INTRAVENOUS | Status: AC
  Administered 2017-07-29: 1000 mL via INTRAVENOUS

## 2017-07-29 MED ORDER — HYDROMORPHONE HCL 1 MG/ML IJ SOLN
0.5000 mg | Freq: Once | INTRAMUSCULAR | Status: AC
  Administered 2017-07-29: 0.5 mg via INTRAVENOUS

## 2017-07-29 MED ORDER — HYDROMORPHONE HCL 1 MG/ML IJ SOLN
INTRAMUSCULAR | Status: AC
  Filled 2017-07-29: qty 1

## 2017-07-29 MED ORDER — HYDROMORPHONE HCL 1 MG/ML IJ SOLN
INTRAMUSCULAR | Status: AC
  Administered 2017-07-29: 1 mg via INTRAVENOUS
  Filled 2017-07-29: qty 1

## 2017-07-29 MED ORDER — HYDROMORPHONE HCL 1 MG/ML IJ SOLN
0.5000 mg | Freq: Once | INTRAMUSCULAR | Status: AC
  Administered 2017-07-29: 0.5 mg via INTRAVENOUS
  Filled 2017-07-29: qty 1

## 2017-07-29 MED ORDER — HYDROMORPHONE HCL 1 MG/ML IJ SOLN
1.0000 mg | Freq: Once | INTRAMUSCULAR | Status: AC
  Administered 2017-07-29: 1 mg via INTRAVENOUS

## 2017-07-29 NOTE — ED Triage Notes (Signed)
Patient reports intermittent abdominal pain for 2 months with increasing severity today and over the last week.   Patient reports hx of colon cancer and bowel obstruction. Patient reports she had 14 inches of bowel removed.   Patient c/o N/V. Patient reports pencil-like stools with diarrhea today.

## 2017-07-29 NOTE — ED Notes (Signed)
Pt states that two weeks ago her symptoms got worse. She hasn't really eaten in the past two days due to pain and discomfort in her abdomen and lower back area. She has had nausea and been vomiting along with pencil-like stool with diarrhea around it. Abdominal area is swollen and pt is crying and moving around in bed in seek of a more comfortable position.

## 2017-07-29 NOTE — ED Provider Notes (Signed)
[facility] Emergency Department Provider Note  ____________________________________________  Time seen: Approximately 7:44 PM  I have reviewed the triage vital signs and the nursing notes.   HISTORY  Chief Complaint Abdominal Pain    HPI Sheri Calderon is a 52 y.o. female with a history of colon cancer status post partial colectomy for ago, diverticulitis, A. fib, presenting with abdominal pain, nausea and vomiting.  The patient reports that for the past 4 days she has had a progressively worsening lower more than upper abdominal discomfort.  She has had associated nausea and vomiting and noticed that her stool was "pencil thin."  She has not been having flatus.  She has tried "a ton of Aleve" and a hydrocodone from her father, which did not help.  She also tried Azo but denies any dysuria or urinary frequency.  Arrival to the emergency department the patient is very uncomfortable due to her pain.  Past Medical History:  Diagnosis Date  . Allergic rhinitis   . Anxiety   . Ascites    acute  . Atrial fibrillation ([place])    resolved - does not see cardiologist  . Colon cancer [facility]) 2014   cancer of the sigmoid colon  . Depression   . Diverticulitis    "this is my 2nd time in hospital w/this in the last 2 wk" (06/21/2012)  . GERD (gastroesophageal reflux disease)   . Gout   . Hydronephrosis   . Hypertension   . Lower extremity edema   . Migraines   . Osteoarthritis   . Peripheral neuropathy   . PTSD (post-traumatic stress disorder)   . Restless legs syndrome   . S/P chemotherapy, time since less than 4 weeks   . Tremor, essential     Patient Active Problem List   Diagnosis Date Noted  . Genetic testing 02/14/2016  . Personal history of sigmoid colon cancer 10/29/2014  . Weight gain 10/29/2014  . Cancer of sigmoid colon ([place]) 08/04/2012  . Restless legs syndrome     Past Surgical History:  Procedure Laterality Date  . APPENDECTOMY    . PARTIAL  COLECTOMY N/A 06/28/2012   Procedure: PARTIAL COLECTOMY;  Surgeon: Harl Bowie, MD;  Location: [place];  Service: General;  Laterality: N/A;  . PORTACATH PLACEMENT N/A 07/11/2012   Procedure: INSERTION PORT-A-CATH;  Surgeon: Harl Bowie, MD;  Location: [facility];  Service: General;  Laterality: N/A;  . portacath removal    . TUBAL LIGATION  ~ 1989    Current Outpatient Rx  . Order #: [id number] Class: Historical Med  . Order #: [id number] Class: Print  . Order #: [id number] Class: Historical Med  . Order #: [id number] Class: Normal  . Order #: [id number] Class: Historical Med  . Order #: [id number] Class: Historical Med  . Order #: [id number] Class: Normal  . Order #: [id number] Class: Normal  . Order #: [id number] Class: Historical Med  . Order #: [id number] Class: Historical Med  . Order #: [id number] Class: Historical Med  . Order #: [id number] Class: Historical Med  . Order #: [id number] Class: Historical Med    Allergies Lorazepam  Family History  Problem Relation Age of Onset  . Diabetes Mother   . Heart disease Mother   . Stomach cancer Mother   . Ovarian cancer Mother 73  . COPD Father   . Hypertension Father   . Hyperlipidemia Father   . Colon polyps Father        38 lifetime colon polyps  . Down syndrome Paternal Aunt   . Cancer  Paternal Uncle        cancer in the spine  . COPD Maternal Grandmother   . Stomach cancer Paternal Grandmother     Social History Social History   Tobacco Use  . Smoking status: Current Every Day Smoker    Packs/day: 0.20    Years: 15.00    Pack years: 3.00    Types: Cigarettes    Start date: 01/17/1991  . Smokeless tobacco: Never Used  . Tobacco comment: QUIT 2 YEARS AGO  Substance Use Topics  . Alcohol use: No    Alcohol/week: 0.0 oz  . Drug use: No    Review of Systems Constitutional: No fever/chills.  No lightheadedness or syncope. Eyes: No visual changes. ENT: No sore throat. No congestion or rhinorrhea. Cardiovascular: Denies chest  pain. Denies palpitations. Respiratory: Denies shortness of breath.  No cough. Gastrointestinal: Positive diffuse lower greater than upper abdominal pain.  +nausea, +vomiting.  No diarrhea.  No constipation.  + change in stool caliber.  Genitourinary: Negative for dysuria.  No urinary frequency. Musculoskeletal: . Skin: Negative for rash. Neurological: Negative for headaches. No focal numbness, tingling or weakness.     ____________________________________________   PHYSICAL EXAM:  VITAL SIGNS: ED Triage Vitals [07/29/17 1924]  Enc Vitals Group     BP (!) 162/112     Pulse Rate (!) 105     Resp (!) 23     Temp 97.8 F (36.6 C)     Temp Source Oral     SpO2 95 %     Weight 210 lb (95.3 kg)     Height      Head Circumference      Peak Flow      Pain Score 10     Pain Loc      Pain Edu?      Excl. in [place]?     Constitutional: Patient is tearful, and doubled over in fetal position, very uncomfortable.  GCS is 15. Eyes: Conjunctivae are normal.  EOMI. No scleral icterus. Head: Atraumatic. Nose: No congestion/rhinnorhea. Mouth/Throat: Mucous membranes are moist.  Neck: No stridor.  Supple.   Cardiovascular: Normal rate, regular rhythm. No murmurs, rubs or gallops.  Respiratory: Normal respiratory effort.  No accessory muscle use or retractions. Lungs CTAB.  No wheezes, rales or ronchi. Gastrointestinal: Obese.  Soft, and nondistended.  The patient does have tenderness to palpation in the lower greater than in the upper abdomen but diffusely throughout.  No guarding or rebound.  No peritoneal signs. Musculoskeletal: No LE edema. Neurologic:  A&Ox3.  Speech is clear.  Face and smile are symmetric.  EOMI.  Moves all extremities well. Skin:  Skin is warm, dry and intact. No rash noted. Psychiatric: Mood and affect are normal. Speech and behavior are normal.  Normal judgement.  ____________________________________________   LABS (all labs ordered are listed, but only abnormal  results are displayed)  Labs Reviewed  COMPREHENSIVE METABOLIC PANEL - Abnormal; Notable for the following components:      Result Value   Glucose, Bld 120 (*)    Total Protein 8.3 (*)    All other components within normal limits  CBC - Abnormal; Notable for the following components:   WBC 11.3 (*)    All other components within normal limits  LIPASE, BLOOD  URINALYSIS, COMPLETE (UACMP) WITH MICROSCOPIC   ____________________________________________  EKG  Not indcated ____________________________________________  RADIOLOGY  No results found.  ____________________________________________   PROCEDURES  Procedure(s) performed: None  Procedures  Critical  Care performed: No ____________________________________________   INITIAL IMPRESSION / ASSESSMENT AND PLAN / ED COURSE  Pertinent labs & imaging results that were available during my care of the patient were reviewed by me and considered in my medical decision making (see chart for details).  52 y.o. female with a history of partial colectomy for colon cancer 4 years ago presenting with several days of progressively worsening abdominal pain, nausea and vomiting, decreased flatus and change in stool caliber.  Overall, I am concerned about return of malignancy but would also consider small bowel obstruction is the most likely etiology.  We will evaluate for urinary tract infection, appendicitis, diverticulitis.  The patient will undergo CT examination, and will be treated symptomatically.  Plan reevaluation for final disposition.  ----------------------------------------- 8:30 PM on 07/29/2017 -----------------------------------------  The patient has been signed out to Dr. Corinna Capra for final disposition.  ____________________________________________  FINAL CLINICAL IMPRESSION(S) / ED DIAGNOSES  Final diagnoses:  Generalized abdominal pain  Non-intractable vomiting with nausea, unspecified vomiting type          NEW MEDICATIONS STARTED DURING THIS VISIT:  New Prescriptions   No medications on file      Eula Listen, MD 07/29/17 2031

## 2017-07-30 ENCOUNTER — Ambulatory Visit: Payer: Medicare Other | Admitting: Family

## 2017-07-30 ENCOUNTER — Other Ambulatory Visit: Payer: Self-pay

## 2017-07-30 ENCOUNTER — Other Ambulatory Visit: Payer: Medicare Other

## 2017-07-30 DIAGNOSIS — D123 Benign neoplasm of transverse colon: Secondary | ICD-10-CM | POA: Diagnosis not present

## 2017-07-30 DIAGNOSIS — Z800 Family history of malignant neoplasm of digestive organs: Secondary | ICD-10-CM | POA: Diagnosis not present

## 2017-07-30 DIAGNOSIS — I480 Paroxysmal atrial fibrillation: Secondary | ICD-10-CM | POA: Diagnosis present

## 2017-07-30 DIAGNOSIS — K529 Noninfective gastroenteritis and colitis, unspecified: Secondary | ICD-10-CM | POA: Diagnosis present

## 2017-07-30 DIAGNOSIS — N281 Cyst of kidney, acquired: Secondary | ICD-10-CM | POA: Diagnosis present

## 2017-07-30 DIAGNOSIS — Y92009 Unspecified place in unspecified non-institutional (private) residence as the place of occurrence of the external cause: Secondary | ICD-10-CM | POA: Diagnosis not present

## 2017-07-30 DIAGNOSIS — Z888 Allergy status to other drugs, medicaments and biological substances status: Secondary | ICD-10-CM | POA: Diagnosis not present

## 2017-07-30 DIAGNOSIS — K621 Rectal polyp: Secondary | ICD-10-CM | POA: Diagnosis not present

## 2017-07-30 DIAGNOSIS — R1031 Right lower quadrant pain: Secondary | ICD-10-CM | POA: Diagnosis not present

## 2017-07-30 DIAGNOSIS — Z8249 Family history of ischemic heart disease and other diseases of the circulatory system: Secondary | ICD-10-CM | POA: Diagnosis not present

## 2017-07-30 DIAGNOSIS — F329 Major depressive disorder, single episode, unspecified: Secondary | ICD-10-CM | POA: Diagnosis present

## 2017-07-30 DIAGNOSIS — K5090 Crohn's disease, unspecified, without complications: Secondary | ICD-10-CM | POA: Diagnosis present

## 2017-07-30 DIAGNOSIS — F419 Anxiety disorder, unspecified: Secondary | ICD-10-CM | POA: Diagnosis present

## 2017-07-30 DIAGNOSIS — Z825 Family history of asthma and other chronic lower respiratory diseases: Secondary | ICD-10-CM | POA: Diagnosis not present

## 2017-07-30 DIAGNOSIS — T39315A Adverse effect of propionic acid derivatives, initial encounter: Secondary | ICD-10-CM | POA: Diagnosis present

## 2017-07-30 DIAGNOSIS — R112 Nausea with vomiting, unspecified: Secondary | ICD-10-CM | POA: Diagnosis not present

## 2017-07-30 DIAGNOSIS — Z9221 Personal history of antineoplastic chemotherapy: Secondary | ICD-10-CM | POA: Diagnosis not present

## 2017-07-30 DIAGNOSIS — F17210 Nicotine dependence, cigarettes, uncomplicated: Secondary | ICD-10-CM | POA: Diagnosis present

## 2017-07-30 DIAGNOSIS — R1084 Generalized abdominal pain: Secondary | ICD-10-CM | POA: Diagnosis present

## 2017-07-30 DIAGNOSIS — Z85038 Personal history of other malignant neoplasm of large intestine: Secondary | ICD-10-CM | POA: Diagnosis not present

## 2017-07-30 DIAGNOSIS — K6389 Other specified diseases of intestine: Secondary | ICD-10-CM | POA: Diagnosis not present

## 2017-07-30 DIAGNOSIS — I10 Essential (primary) hypertension: Secondary | ICD-10-CM | POA: Diagnosis present

## 2017-07-30 DIAGNOSIS — K219 Gastro-esophageal reflux disease without esophagitis: Secondary | ICD-10-CM | POA: Diagnosis present

## 2017-07-30 DIAGNOSIS — Z9049 Acquired absence of other specified parts of digestive tract: Secondary | ICD-10-CM | POA: Diagnosis not present

## 2017-07-30 DIAGNOSIS — Z8349 Family history of other endocrine, nutritional and metabolic diseases: Secondary | ICD-10-CM | POA: Diagnosis not present

## 2017-07-30 DIAGNOSIS — Z923 Personal history of irradiation: Secondary | ICD-10-CM | POA: Diagnosis not present

## 2017-07-30 DIAGNOSIS — D122 Benign neoplasm of ascending colon: Secondary | ICD-10-CM | POA: Diagnosis not present

## 2017-07-30 DIAGNOSIS — Z833 Family history of diabetes mellitus: Secondary | ICD-10-CM | POA: Diagnosis not present

## 2017-07-30 DIAGNOSIS — Z8041 Family history of malignant neoplasm of ovary: Secondary | ICD-10-CM | POA: Diagnosis not present

## 2017-07-30 LAB — HEMOGLOBIN A1C
Hgb A1c MFr Bld: 5.2 % (ref 4.8–5.6)
Mean Plasma Glucose: 102.54 mg/dL

## 2017-07-30 LAB — TSH: TSH: 2.581 u[IU]/mL (ref 0.350–4.500)

## 2017-07-30 MED ORDER — METRONIDAZOLE IN NACL 5-0.79 MG/ML-% IV SOLN
500.0000 mg | Freq: Three times a day (TID) | INTRAVENOUS | Status: DC
  Administered 2017-07-30 – 2017-07-31 (×4): 500 mg via INTRAVENOUS
  Filled 2017-07-30 (×6): qty 100

## 2017-07-30 MED ORDER — HYDROMORPHONE HCL 1 MG/ML IJ SOLN: 0.5000 mg | INTRAMUSCULAR | Status: DC | PRN

## 2017-07-30 MED ORDER — MORPHINE SULFATE (PF) 2 MG/ML IV SOLN
2.0000 mg | INTRAVENOUS | Status: DC | PRN
  Administered 2017-07-30 – 2017-07-31 (×8): 2 mg via INTRAVENOUS
  Filled 2017-07-30 (×8): qty 1

## 2017-07-30 MED ORDER — LISINOPRIL 10 MG PO TABS
5.0000 mg | ORAL_TABLET | Freq: Every day | ORAL | Status: DC
  Administered 2017-07-30 – 2017-08-01 (×3): 5 mg via ORAL
  Filled 2017-07-30 (×3): qty 1

## 2017-07-30 MED ORDER — ENOXAPARIN SODIUM 40 MG/0.4ML ~~LOC~~ SOLN
40.0000 mg | SUBCUTANEOUS | Status: DC
  Administered 2017-07-30 – 2017-07-31 (×2): 40 mg via SUBCUTANEOUS
  Filled 2017-07-30 (×2): qty 0.4

## 2017-07-30 MED ORDER — DIAZEPAM 5 MG PO TABS
5.0000 mg | ORAL_TABLET | Freq: Two times a day (BID) | ORAL | Status: DC
  Administered 2017-07-30 – 2017-08-01 (×5): 5 mg via ORAL
  Filled 2017-07-30 (×5): qty 1

## 2017-07-30 MED ORDER — ACETAMINOPHEN 325 MG PO TABS
650.0000 mg | ORAL_TABLET | Freq: Four times a day (QID) | ORAL | Status: DC | PRN
  Filled 2017-07-30: qty 2

## 2017-07-30 MED ORDER — DULOXETINE HCL 30 MG PO CPEP
30.0000 mg | ORAL_CAPSULE | Freq: Three times a day (TID) | ORAL | Status: DC
  Administered 2017-07-30 – 2017-08-01 (×6): 30 mg via ORAL
  Filled 2017-07-30 (×6): qty 1

## 2017-07-30 MED ORDER — DOCUSATE SODIUM 100 MG PO CAPS
100.0000 mg | ORAL_CAPSULE | Freq: Two times a day (BID) | ORAL | Status: DC
  Administered 2017-07-30 – 2017-07-31 (×3): 100 mg via ORAL
  Filled 2017-07-30 (×3): qty 1

## 2017-07-30 MED ORDER — NICOTINE 21 MG/24HR TD PT24: 21.0000 mg | MEDICATED_PATCH | Freq: Every day | TRANSDERMAL | Status: DC

## 2017-07-30 MED ORDER — RIFAXIMIN 550 MG PO TABS
550.0000 mg | ORAL_TABLET | Freq: Three times a day (TID) | ORAL | Status: DC
  Administered 2017-07-30 – 2017-07-31 (×3): 550 mg via ORAL
  Filled 2017-07-30 (×7): qty 1

## 2017-07-30 MED ORDER — SODIUM CHLORIDE 0.9 % IV SOLN
INTRAVENOUS | Status: DC
  Administered 2017-07-30 – 2017-07-31 (×4): via INTRAVENOUS

## 2017-07-30 MED ORDER — ONDANSETRON HCL 4 MG/2ML IJ SOLN
4.0000 mg | Freq: Four times a day (QID) | INTRAMUSCULAR | Status: DC | PRN
  Administered 2017-07-30 – 2017-07-31 (×2): 4 mg via INTRAVENOUS
  Filled 2017-07-30 (×2): qty 2

## 2017-07-30 MED ORDER — ONDANSETRON HCL 4 MG PO TABS
4.0000 mg | ORAL_TABLET | Freq: Four times a day (QID) | ORAL | Status: DC | PRN
  Administered 2017-07-30 – 2017-08-01 (×2): 4 mg via ORAL
  Filled 2017-07-30 (×3): qty 1

## 2017-07-30 MED ORDER — SPIRONOLACTONE 25 MG PO TABS
50.0000 mg | ORAL_TABLET | Freq: Two times a day (BID) | ORAL | Status: DC
  Administered 2017-07-30 – 2017-08-01 (×4): 50 mg via ORAL
  Filled 2017-07-30 (×4): qty 2

## 2017-07-30 MED ORDER — ACETAMINOPHEN 650 MG RE SUPP: 650.0000 mg | Freq: Four times a day (QID) | RECTAL | Status: DC | PRN

## 2017-07-30 MED ORDER — BUSPIRONE HCL 10 MG PO TABS
10.0000 mg | ORAL_TABLET | Freq: Every evening | ORAL | Status: DC
  Filled 2017-07-30 (×3): qty 1

## 2017-07-30 MED ORDER — CIPROFLOXACIN IN D5W 400 MG/200ML IV SOLN
400.0000 mg | Freq: Two times a day (BID) | INTRAVENOUS | Status: DC
  Administered 2017-07-30 – 2017-07-31 (×3): 400 mg via INTRAVENOUS
  Filled 2017-07-30 (×4): qty 200

## 2017-07-30 NOTE — H&P (Signed)
Sheri Calderon is an 52 y.o. female.   Chief Complaint: Abdominal pain HPI: The patient with past medical history of colon cancer status post chemotherapy and radiation, paroxysmal atrial fibrillation and hypertension presents to the emergency department with abdominal pain.  The patient states that she has had excruciating lower abdominal pain for 3 days.  She has had subjective fevers as well as nausea and vomiting.  She is also noticed that the caliber of her stool has changed from normal to very narrow.  She states that it is hard for her to have a bowel movement but her stool comes out soft.  She is concerned because this is the way her colon cancer presented.  She has been in remission for 5 years.  Past Medical History:  Diagnosis Date  . Allergic rhinitis   . Anxiety   . Ascites    acute  . Atrial fibrillation ([place])    resolved - does not see cardiologist  . Colon cancer [facility]) 2014   cancer of the sigmoid colon  . Depression   . Diverticulitis    "this is my 2nd time in hospital w/this in the last 2 wk" (06/21/2012)  . GERD (gastroesophageal reflux disease)   . Gout   . Hydronephrosis   . Hypertension   . Lower extremity edema   . Migraines   . Osteoarthritis   . Peripheral neuropathy   . PTSD (post-traumatic stress disorder)   . Restless legs syndrome   . S/P chemotherapy, time since less than 4 weeks   . Tremor, essential     Past Surgical History:  Procedure Laterality Date  . APPENDECTOMY    . PARTIAL COLECTOMY N/A 06/28/2012   Procedure: PARTIAL COLECTOMY;  Surgeon: Harl Bowie, MD;  Location: [place];  Service: General;  Laterality: N/A;  . PORTACATH PLACEMENT N/A 07/11/2012   Procedure: INSERTION PORT-A-CATH;  Surgeon: Harl Bowie, MD;  Location[facility];  Service: General;  Laterality: N/A;  . portacath removal    . TUBAL LIGATION  ~ 1989    Family History  Problem Relation Age of Onset  . Diabetes Mother   . Heart disease Mother   . Stomach cancer  Mother   . Ovarian cancer Mother 52  . COPD Father   . Hypertension Father   . Hyperlipidemia Father   . Colon polyps Father        70 lifetime colon polyps  . Down syndrome Paternal Aunt   . Cancer Paternal Uncle        cancer in the spine  . COPD Maternal Grandmother   . Stomach cancer Paternal Grandmother    Social History:  reports that she has been smoking cigarettes.  She started smoking about 26 years ago. She has a 3.00 pack-year smoking history. She has never used smokeless tobacco. She reports that she does not drink alcohol or use drugs.  Allergies:  Allergies  Allergen Reactions  . Lorazepam Hives and Swelling    Patient reports receiving lorazepam intensol in the hospital and experienced swelling with hives.    Medications Prior to Admission  Medication Sig Dispense Refill  . busPIRone (BUSPAR) 10 MG tablet Take 10 mg by mouth every evening.    . diazepam (VALIUM) 5 MG tablet Take 1 tablet (5 mg total) by mouth 2 (two) times daily. 10 tablet 0  . DULoxetine (CYMBALTA) 30 MG capsule Take 30 mg by mouth 3 (three) times daily.     . hyaluronate sodium (  RADIAPLEXRX) GEL Apply 1 application topically 3 (three) times daily. 1 Tube 3  . lisinopril (PRINIVIL,ZESTRIL) 5 MG tablet Take 5 mg by mouth daily.  5  . methadone (DOLOPHINE) 10 MG tablet Take 10 mg by mouth 2 (two) times daily as needed for moderate pain.     . methylPREDNISolone (MEDROL DOSEPAK) 4 MG TBPK tablet Take as directed on package. 21 tablet 0  . metroNIDAZOLE (FLAGYL) 500 MG tablet Take 1 tablet (500 mg total) by mouth 3 (three) times daily. 30 tablet 0  . omeprazole (PRILOSEC) 20 MG capsule Take 20 mg by mouth daily.    . ondansetron (ZOFRAN) 4 MG tablet Take 4 mg by mouth 2 (two) times daily.    Marland Kitchen oxyCODONE-acetaminophen (PERCOCET) 7.5-325 MG tablet Take 1 tablet by mouth every 6 (six) hours as needed for severe pain.    . potassium chloride (K-DUR) 10 MEQ tablet Take 10 mEq by mouth daily.    Marland Kitchen  spironolactone (ALDACTONE) 50 MG tablet Take 50 mg by mouth 2 (two) times daily.  5    Results for orders placed or performed during the hospital encounter of 07/29/17 (from the past 48 hour(s))  Lipase, blood     Status: None   Collection Time: 07/29/17  7:26 PM  Result Value Ref Range   Lipase 26 11 - 51 U/L    Comment: Performed at [facility], [address] [place], [place] [address]  Comprehensive metabolic panel     Status: Abnormal   Collection Time: 07/29/17  7:26 PM  Result Value Ref Range   Sodium 137 135 - 145 mmol/L   Potassium 3.6 3.5 - 5.1 mmol/L   Chloride 103 101 - 111 mmol/L   CO2 22 22 - 32 mmol/L   Glucose, Bld 120 (H) 65 - 99 mg/dL   BUN 14 6 - 20 mg/dL   Creatinine, Ser 0.92 0.44 - 1.00 mg/dL   Calcium 9.3 8.9 - 10.3 mg/dL   Total Protein 8.3 (H) 6.5 - 8.1 g/dL   Albumin 4.5 3.5 - 5.0 g/dL   AST 33 15 - 41 U/L   ALT 39 14 - 54 U/L   Alkaline Phosphatase 63 38 - 126 U/L   Total Bilirubin 0.4 0.3 - 1.2 mg/dL   GFR calc non Af Amer >60 >60 mL/min   GFR calc Af Amer >60 >60 mL/min    Comment: (NOTE) The eGFR has been calculated using the CKD EPI equation. This calculation has not been validated in all clinical situations. eGFR's persistently <60 mL/min signify possible Chronic Kidney Disease.    Anion gap 12 5 - 15    Comment: Performed at [facility], [address] [place], [place] [address]  CBC     Status: Abnormal   Collection Time: 07/29/17  7:26 PM  Result Value Ref Range   WBC 11.3 (H) 3.6 - 11.0 K/uL   RBC 4.64 3.80 - 5.20 MIL/uL   Hemoglobin 13.5 12.0 - 16.0 g/dL   HCT 39.7 35.0 - 47.0 %   MCV 85.4 80.0 - 100.0 fL   MCH 29.1 26.0 - 34.0 pg   MCHC 34.1 32.0 - 36.0 g/dL   RDW 13.5 11.5 - 14.5 %   Platelets 406 150 - 440 K/uL    Comment: Performed at [facility], [address] [place], [place] [address]  Urinalysis, Complete w Microscopic     Status: Abnormal   Collection Time: 07/29/17  7:26 PM  Result  Value Ref Range   Color, Urine AMBER (A) YELLOW    Comment: BIOCHEMICALS MAY BE AFFECTED BY COLOR   APPearance HAZY (A) CLEAR   Specific Gravity, Urine 1.029 1.005 - 1.030   pH 5.0 5.0 - 8.0   Glucose, UA NEGATIVE NEGATIVE mg/dL   Hgb urine dipstick NEGATIVE NEGATIVE   Bilirubin Urine NEGATIVE NEGATIVE   Ketones, ur NEGATIVE NEGATIVE mg/dL   Protein, ur 30 (A) NEGATIVE mg/dL   Nitrite NEGATIVE NEGATIVE   Leukocytes, UA TRACE (A) NEGATIVE   RBC / HPF 0-5 0 - 5 RBC/hpf   WBC, UA 6-10 0 - 5 WBC/hpf   Bacteria, UA NONE SEEN NONE SEEN   Squamous Epithelial / LPF 0-5 0 - 5   Mucus PRESENT     Comment: Performed at [facility], [address] [place], [place] [address]   Ct Abdomen Pelvis W Contrast  Result Date: 07/29/2017 CLINICAL DATA:  52 y.o. female with a history of colon cancer status post partial colectomy for ago, diverticulitis, A. fib, presenting with abdominal pain, nausea and vomiting. The patient reports that for the past 4 days she has had a progressively worsening lower more than upper abdominal discomfort. She has had associated nausea and vomiting and noticed that her stool was "pencil thin." She has not been having flatus. EXAM: CT ABDOMEN AND PELVIS WITH CONTRAST TECHNIQUE: Multidetector CT imaging of the abdomen and pelvis was performed using the standard protocol following bolus administration of intravenous contrast. CONTRAST:  142m ISOVUE-300 IOPAMIDOL (ISOVUE-300) INJECTION 61% COMPARISON:  11/10/2016 FINDINGS: Lower chest: Lung bases essentially clear.  Heart normal size. Hepatobiliary: Decreased attenuation of the liver consistent with fatty infiltration. No liver mass or focal lesion. Normal gallbladder. No bile duct dilation. Pancreas: Unremarkable. No pancreatic ductal dilatation or surrounding inflammatory changes. Spleen: Normal in size without focal abnormality. Adrenals/Urinary Tract: No adrenal masses. Bilateral renal sinus cysts, larger on the right.  Several subcentimeter low-density cortical lesions that are also likely cysts. Bilateral renal cortical thinning. No stones. No hydronephrosis. Normal ureters. Bladder is unremarkable. Stomach/Bowel: Mild thickening of the wall of the ileum with lies in the pelvis, with small amounts of adjacent fluid attenuation. Remainder of the small bowel is unremarkable. Bowel anastomosis staple line along the lower sigmoid colon. No colonic wall thickening or adjacent inflammation. There is no bowel distention to suggest obstruction. Stomach is unremarkable.  Appendix has been removed. Vascular/Lymphatic: No significant vascular findings are present. No enlarged abdominal or pelvic lymph nodes. Reproductive: Uterus and bilateral adnexa are unremarkable. Other: Small fat containing umbilical hernia, stable from the prior study. Musculoskeletal: No fracture or acute finding. No osteoblastic or osteolytic lesions. IMPRESSION: 1. Mild wall thickening of the distal ileum where it lies within the pelvis. There is a small amount of adjacent fluid attenuation, which may reflect inflammation or be due to physiologic free fluid in the pelvis. Suspect mild infectious or inflammatory ileitis. 2. No other evidence of an acute abnormality within the abdomen or pelvis. No colonic inflammation. 3. Hepatic steatosis. 4. Multiple renal cysts primarily renal sinus cysts, stable from the prior CT. Electronically Signed   By: DLajean ManesM.D.   On: 07/29/2017 21:39    Review of Systems  Constitutional: Negative for chills and fever.  HENT: Negative for sore throat and tinnitus.   Eyes: Negative for blurred vision and redness.  Respiratory: Negative for cough and shortness of breath.   Cardiovascular: Negative for chest pain, palpitations, orthopnea and PND.  Gastrointestinal: Positive  for abdominal pain, nausea and vomiting. Negative for diarrhea.  Genitourinary: Negative for dysuria, frequency and urgency.  Musculoskeletal: Negative  for joint pain and myalgias.  Skin: Negative for rash.       No lesions  Neurological: Negative for speech change, focal weakness and weakness.  Endo/Heme/Allergies: Does not bruise/bleed easily.       No temperature intolerance  Psychiatric/Behavioral: Negative for depression and suicidal ideas.    Blood pressure 116/90, pulse 88, temperature 98.3 F (36.8 C), temperature source Oral, resp. rate 20, height 5' 7"  (1.702 m), weight 95.3 kg (210 lb), last menstrual period 10/12/2012, SpO2 95 %. Physical Exam  Vitals reviewed. Constitutional: She is oriented to person, place, and time. She appears well-developed and well-nourished.  HENT:  Head: Normocephalic and atraumatic.  Mouth/Throat: Oropharynx is clear and moist.  Eyes: Pupils are equal, round, and reactive to light. Conjunctivae and EOM are normal. No scleral icterus.  Neck: Normal range of motion. Neck supple. No JVD present. No tracheal deviation present. No thyromegaly present.  Cardiovascular: Normal rate, regular rhythm and normal heart sounds. Exam reveals no gallop and no friction rub.  No murmur heard. Respiratory: Effort normal and breath sounds normal.  GI: Soft. Bowel sounds are normal. She exhibits no distension and no mass. There is tenderness. There is guarding. There is no rebound.  Genitourinary:  Genitourinary Comments: Deferred  Musculoskeletal: Normal range of motion. She exhibits no edema.  Lymphadenopathy:    She has no cervical adenopathy.  Neurological: She is alert and oriented to person, place, and time. No cranial nerve deficit. She exhibits normal muscle tone.  Skin: Skin is warm and dry. No rash noted. No erythema.  Psychiatric: She has a normal mood and affect. Her behavior is normal. Judgment and thought content normal.     Assessment/Plan This is a 52 year old female admitted for ileitis. 1.  Ileitis: Small caliber stools and worsening abdominal pain.  I started the patient on rifaximin.  Consult  gastroenterology. 2.  Intractable abdominal pain: IV morphine and Dilaudid as needed.  Questionable history of abuse of pain medication.  Analgesia with caution  3.  DVT prophylaxis: Lovenox 4.  GI prophylaxis: None Exline the patient is a full code.  Time spent on admission orders and patient care approximately 45 minutes  Harrie Foreman, MD 07/30/2017, 3:36 AM

## 2017-07-30 NOTE — Consult Note (Signed)
Sheri Lame, MD [facility]  [address] [place], [place] [address] Phone: [phone] Fax : [phone]  Consultation  Referring Provider:     Dr. Benjie Karvonen Primary Care Physician:  Alvester Chou, NP Primary Gastroenterologist:  Dr. Carlean Purl         Reason for Consultation:     Sheri Calderon  Date of Admission:  07/29/2017 Date of Consultation:  07/30/2017         HPI:   Sheri Calderon is a 52 y.o. female was admitted with abdominal pain that she reports to be present for the last 2 months.  The patient reports that the pain started to get worse for the last 3 days and she came to emergency room.  The patient has a history of a similar attack and was treated with antibiotics and she states that the symptoms went away.  The patient reports that she has a history of colon cancer and her last colonoscopy was in 2016.  She now has had a change in bowel habits with smaller stool caliber and decreased consistency.  She states that this is very similar to the way her colon cancer had presented.  The patient reports that she feels distended with a lot of bloating.  The patient had a CT scan of the abdomen that showed mild wall thickening of the distal ileum with a small amount of adjacent fluid attenuation.  There is also hepatic steatosis and multiple renal cysts seen.  The patient's lab work showed her to have a elevated white cell counts with a slight elevation of 11.3 with a normal being below 11.  The hemoglobin was normal and so were the liver enzymes.  The patient also had a normal lipase level.  The patient usually goes to [facility] for her care and follows with an oncologist there but the pain was so severe she decided to come to [facility].  Past Medical History:  Diagnosis Date  . Allergic rhinitis   . Anxiety   . Ascites    acute  . Atrial fibrillation ([place])    resolved - does not see cardiologist  . Colon cancer [facility]) 2014   cancer of the sigmoid colon  .  Depression   . Diverticulitis    "this is my 2nd time in hospital w/this in the last 2 wk" (06/21/2012)  . GERD (gastroesophageal reflux disease)   . Gout   . Hydronephrosis   . Hypertension   . Lower extremity edema   . Migraines   . Osteoarthritis   . Peripheral neuropathy   . PTSD (post-traumatic stress disorder)   . Restless legs syndrome   . S/P chemotherapy, time since less than 4 weeks   . Tremor, essential     Past Surgical History:  Procedure Laterality Date  . APPENDECTOMY    . PARTIAL COLECTOMY N/A 06/28/2012   Procedure: PARTIAL COLECTOMY;  Surgeon: Harl Bowie, MD;  Location: [place];  Service: General;  Laterality: N/A;  . PORTACATH PLACEMENT N/A 07/11/2012   Procedure: INSERTION PORT-A-CATH;  Surgeon: Harl Bowie, MD;  [facility];  Service: General;  Laterality: N/A;  . portacath removal    . TUBAL LIGATION  ~ 1989    Prior to Admission medications   Medication Sig Start Date End Date Taking? Authorizing Provider  busPIRone (BUSPAR) 10 MG tablet Take 10 mg by mouth every evening.    [provider]  diazepam (VALIUM) 5 MG tablet Take 1 tablet (  5 mg total) by mouth 2 (two) times daily. 01/10/16   Recardo Evangelist, PA-C  DULoxetine (CYMBALTA) 30 MG capsule Take 30 mg by mouth 3 (three) times daily.     [provider]  hyaluronate sodium (RADIAPLEXRX) GEL Apply 1 application topically 3 (three) times daily. 11/16/16   [place], Holli Humbles, NP  lisinopril (PRINIVIL,ZESTRIL) 5 MG tablet Take 5 mg by mouth daily. 12/28/15   [provider]  methadone (DOLOPHINE) 10 MG tablet Take 10 mg by mouth 2 (two) times daily as needed for moderate pain.  10/22/14   [provider]  methylPREDNISolone (MEDROL DOSEPAK) 4 MG TBPK tablet Take as directed on package. 11/10/16   [place], Holli Humbles, NP  metroNIDAZOLE (FLAGYL) 500 MG tablet Take 1 tablet (500 mg total) by mouth 3 (three) times daily. 11/11/16   Volanda Napoleon, MD    omeprazole (PRILOSEC) 20 MG capsule Take 20 mg by mouth daily.    [provider]  ondansetron (ZOFRAN) 4 MG tablet Take 4 mg by mouth 2 (two) times daily.    [provider]  oxyCODONE-acetaminophen (PERCOCET) 7.5-325 MG tablet Take 1 tablet by mouth every 6 (six) hours as needed for severe pain.    [provider]  potassium chloride (K-DUR) 10 MEQ tablet Take 10 mEq by mouth daily.    [provider]  spironolactone (ALDACTONE) 50 MG tablet Take 50 mg by mouth 2 (two) times daily. 10/12/14   [provider]    Family History  Problem Relation Age of Onset  . Diabetes Mother   . Heart disease Mother   . Stomach cancer Mother   . Ovarian cancer Mother 68  . COPD Father   . Hypertension Father   . Hyperlipidemia Father   . Colon polyps Father        62 lifetime colon polyps  . Down syndrome Paternal Aunt   . Cancer Paternal Uncle        cancer in the spine  . COPD Maternal Grandmother   . Stomach cancer Paternal Grandmother      Social History   Tobacco Use  . Smoking status: Current Every Day Smoker    Packs/day: 0.20    Years: 15.00    Pack years: 3.00    Types: Cigarettes    Start date: 01/17/1991  . Smokeless tobacco: Never Used  . Tobacco comment: QUIT 2 YEARS AGO  Substance Use Topics  . Alcohol use: No    Alcohol/week: 0.0 oz  . Drug use: No    Allergies as of 07/29/2017 - Review Complete 07/29/2017  Allergen Reaction Noted  . Lorazepam Hives and Swelling 08/01/2012    Review of Systems:    All systems reviewed and negative except where noted in HPI.   Physical Exam:  Vital signs in last 24 hours: Temp:  [97.8 F (36.6 C)-98.3 F (36.8 C)] 98.3 F (36.8 C) (05/31 0202) Pulse Rate:  [87-105] 88 (05/31 0202) Resp:  [18-23] 20 (05/31 0202) BP: (116-180)/(87-160) 116/90 (05/31 0202) SpO2:  [94 %-97 %] 95 % (05/31 0202) Weight:  [210 lb (95.3 kg)] 210 lb (95.3 kg) (05/30 1924) Last BM Date: 07/29/17 General:    Pleasant, cooperative in NAD Head:  Normocephalic and atraumatic. Eyes:   No icterus.   Conjunctiva pink. PERRLA. Ears:  Normal auditory acuity. Neck:  Supple; no masses or thyroidomegaly Lungs: Respirations even and unlabored. Lungs clear to auscultation bilaterally.   No wheezes, crackles, or rhonchi.  Heart:  Regular rate and rhythm;  Without murmur, clicks, rubs or gallops Abdomen:  Soft, nondistended, positive tenderness diffusely with worsening of the tenderness in the right lower quadrant. Normal bowel sounds. No appreciable masses or hepatomegaly.  No rebound or guarding.  Rectal:  Not performed. Msk:  Symmetrical without gross deformities.   Extremities:  Without edema, cyanosis or clubbing. Neurologic:  Alert and oriented x3;  grossly normal neurologically. Skin:  Intact without significant lesions or rashes. Cervical Nodes:  No significant cervical adenopathy. Psych:  Alert and cooperative. Normal affect.  LAB RESULTS: Recent Labs    07/29/17 1926  WBC 11.3*  HGB 13.5  HCT 39.7  PLT 406   BMET Recent Labs    07/29/17 1926  NA 137  K 3.6  CL 103  CO2 22  GLUCOSE 120*  BUN 14  CREATININE 0.92  CALCIUM 9.3   LFT Recent Labs    07/29/17 1926  PROT 8.3*  ALBUMIN 4.5  AST 33  ALT 39  ALKPHOS 63  BILITOT 0.4   PT/INR No results for input(s): LABPROT, INR in the last 72 hours.  STUDIES: Ct Abdomen Pelvis W Contrast  Result Date: 07/29/2017 CLINICAL DATA:  52 y.o. female with a history of colon cancer status post partial colectomy for ago, diverticulitis, A. fib, presenting with abdominal pain, nausea and vomiting. The patient reports that for the past 4 days she has had a progressively worsening lower more than upper abdominal discomfort. She has had associated nausea and vomiting and noticed that her stool was "pencil thin." She has not been having flatus. EXAM: CT ABDOMEN AND PELVIS WITH CONTRAST TECHNIQUE: Multidetector CT imaging of the abdomen and  pelvis was performed using the standard protocol following bolus administration of intravenous contrast. CONTRAST:  124m ISOVUE-300 IOPAMIDOL (ISOVUE-300) INJECTION 61% COMPARISON:  11/10/2016 FINDINGS: Lower chest: Lung bases essentially clear.  Heart normal size. Hepatobiliary: Decreased attenuation of the liver consistent with fatty infiltration. No liver mass or focal lesion. Normal gallbladder. No bile duct dilation. Pancreas: Unremarkable. No pancreatic ductal dilatation or surrounding inflammatory changes. Spleen: Normal in size without focal abnormality. Adrenals/Urinary Tract: No adrenal masses. Bilateral renal sinus cysts, larger on the right. Several subcentimeter low-density cortical lesions that are also likely cysts. Bilateral renal cortical thinning. No stones. No hydronephrosis. Normal ureters. Bladder is unremarkable. Stomach/Bowel: Mild thickening of the wall of the ileum with lies in the pelvis, with small amounts of adjacent fluid attenuation. Remainder of the small bowel is unremarkable. Bowel anastomosis staple line along the lower sigmoid colon. No colonic wall thickening or adjacent inflammation. There is no bowel distention to suggest obstruction. Stomach is unremarkable.  Appendix has been removed. Vascular/Lymphatic: No significant vascular findings are present. No enlarged abdominal or pelvic lymph nodes. Reproductive: Uterus and bilateral adnexa are unremarkable. Other: Small fat containing umbilical hernia, stable from the prior study. Musculoskeletal: No fracture or acute finding. No osteoblastic or osteolytic lesions. IMPRESSION: 1. Mild wall thickening of the distal ileum where it lies within the pelvis. There is a small amount of adjacent fluid attenuation, which may reflect inflammation or be due to physiologic free fluid in the pelvis. Suspect mild infectious or inflammatory ileitis. 2. No other evidence of an acute abnormality within the abdomen or pelvis. No colonic  inflammation. 3. Hepatic steatosis. 4. Multiple renal cysts primarily renal sinus cysts, stable from the prior CT. Electronically Signed   By: DLajean ManesM.D.   On: 07/29/2017 21:39      Impression / Plan:  Assessment: Active Problems:   Ileitis   JENNAE HAKEEM is a 52 y.o. y/o female with a history of colon cancer and she has been following up with oncology.  The patient had a colonoscopy 3 years ago.  The patient now has a change in bowel habits with smaller stool caliber and looser stools with right-sided abdominal pain.  The patient was also found to have ileitis on a CT scan.  The patient previously had an episode of this that was treated with antibiotics and she quickly recovered.  Plan: The patient has been started on antibiotics for her abdominal pain.  Due to the severe abdominal pain I would recommend holding off on any endoscopic intervention at this time.  The patient will need a colonoscopy when the pain subsides to assess the area of ileitis.  The patient has been explained the plan and agrees with it.  Thank you for involving me in the care of this patient.      LOS: 0 days   Sheri Lame, MD  07/30/2017, 11:53 AM    Note: This dictation was prepared with Dragon dictation along with smaller phrase technology. Any transcriptional errors that result from this process are unintentional.

## 2017-07-30 NOTE — Progress Notes (Addendum)
Patient admitted this morning for ileitis.  GI consultation pending continue supportive management and add antibiotics for now.  Tobacco dependence: Patient is encouraged to quit smoking. Counseling was provided for 4 minutes. Nicotine patch ordered as per patient's request

## 2017-07-30 NOTE — Progress Notes (Signed)
\  Pharmacy Antibiotic Note  Sheri Calderon is a 52 y.o. female admitted on 07/29/2017 with intra abdominal infx .  Pharmacy has been consulted for cipro dosing.  Plan: cipro 473m iv q12h  Height: 5' 7"  (170.2 cm) Weight: 210 lb (95.3 kg) IBW/kg (Calculated) : 61.6  Temp (24hrs), Avg:98.1 F (36.7 C), Min:97.8 F (36.6 C), Max:98.3 F (36.8 C)  Recent Labs  Lab 07/29/17 1926  WBC 11.3*  CREATININE 0.92    Estimated Creatinine Clearance: 84.8 mL/min (by C-G formula based on SCr of 0.92 mg/dL).    Allergies  Allergen Reactions  . Lorazepam Hives and Swelling    Patient reports receiving lorazepam intensol in the hospital and experienced swelling with hives.    Antimicrobials this admission: Anti-infectives (From admission, onward)   Start     Dose/Rate Route Frequency Ordered Stop   07/30/17 0915  ciprofloxacin (CIPRO) IVPB 400 mg     400 mg 200 mL/hr over 60 Minutes Intravenous Every 12 hours 07/30/17 0901     07/30/17 0900  metroNIDAZOLE (FLAGYL) IVPB 500 mg     500 mg 100 mL/hr over 60 Minutes Intravenous Every 8 hours 07/30/17 0841     07/30/17 0115  rifaximin (XIFAXAN) tablet 550 mg     550 mg Oral 3 times daily 07/30/17 0107         Microbiology results: No results found for this or any previous visit (from the past 240 hour(s)).   Thank you for allowing pharmacy to be a part of this patient's care.  GDonna ChristenCoffee 07/30/2017 9:01 AM

## 2017-07-31 DIAGNOSIS — D123 Benign neoplasm of transverse colon: Secondary | ICD-10-CM

## 2017-07-31 DIAGNOSIS — D122 Benign neoplasm of ascending colon: Secondary | ICD-10-CM

## 2017-07-31 DIAGNOSIS — R112 Nausea with vomiting, unspecified: Secondary | ICD-10-CM

## 2017-07-31 DIAGNOSIS — K529 Noninfective gastroenteritis and colitis, unspecified: Principal | ICD-10-CM

## 2017-07-31 LAB — URINE CULTURE: Culture: <10000 — AB

## 2017-07-31 LAB — C-REACTIVE PROTEIN: CRP: <0.8 mg/dL (ref <1.0)

## 2017-07-31 LAB — HIV ANTIBODY (ROUTINE TESTING W REFLEX): HIV Screen 4th Generation wRfx: NONREACTIVE

## 2017-07-31 MED ORDER — PANTOPRAZOLE SODIUM 40 MG PO TBEC
40.0000 mg | DELAYED_RELEASE_TABLET | Freq: Every day | ORAL | Status: DC
  Administered 2017-07-31 – 2017-08-01 (×2): 40 mg via ORAL
  Filled 2017-07-31 (×2): qty 1

## 2017-07-31 MED ORDER — ONDANSETRON HCL 4 MG PO TABS
4.0000 mg | ORAL_TABLET | Freq: Two times a day (BID) | ORAL | Status: DC
  Administered 2017-07-31 (×2): 4 mg via ORAL
  Filled 2017-07-31 (×2): qty 1

## 2017-07-31 MED ORDER — PEG 3350-KCL-NA BICARB-NACL 420 G PO SOLR
4000.0000 mL | Freq: Once | ORAL | Status: AC
  Administered 2017-07-31: 4000 mL via ORAL
  Filled 2017-07-31: qty 4000

## 2017-07-31 MED ORDER — HYDROCODONE-ACETAMINOPHEN 5-325 MG PO TABS
1.0000 | ORAL_TABLET | Freq: Four times a day (QID) | ORAL | Status: DC | PRN
  Administered 2017-07-31 – 2017-08-01 (×3): 2 via ORAL
  Filled 2017-07-31 (×4): qty 2

## 2017-07-31 MED ORDER — POTASSIUM CHLORIDE CRYS ER 10 MEQ PO TBCR
10.0000 meq | EXTENDED_RELEASE_TABLET | Freq: Every day | ORAL | Status: DC
  Administered 2017-07-31 – 2017-08-01 (×2): 10 meq via ORAL
  Filled 2017-07-31 (×4): qty 1

## 2017-07-31 MED ORDER — HYDROMORPHONE HCL 1 MG/ML IJ SOLN
0.5000 mg | Freq: Four times a day (QID) | INTRAMUSCULAR | Status: DC | PRN
  Administered 2017-07-31 (×2): 0.5 mg via INTRAVENOUS
  Filled 2017-07-31: qty 0.5

## 2017-07-31 NOTE — Progress Notes (Signed)
Cephas Darby, MD [address]  [place]  [place], [place] [address]  Main: [phone]  Fax: [phone] Pager: [phone]   Subjective: Patient continues to have ongoing right lower abdominal pain, mild in severity, associated with nausea. She is able to tolerate clear liquids but pain gets worse after by mouth intake. Her family is bedside. Patient reports that she has been taking Aleve and Excedrin for approximately 2 years for chronic headaches and abdominal pain. She reports that she has been dealing with this episode for the last 2 weeks and prior to that it was intermittent. She did not have BM in last 2 days.   Objective: Vital signs in last 24 hours: Vitals:   07/30/17 1244 07/30/17 1933 07/31/17 0437 07/31/17 1219  BP: (!) 139/93 130/80 (!) 154/91 (!) 143/92  Pulse: 78 72 76 69  Resp: 17 20 16 16   Temp: 97.7 F (36.5 C) 98 F (36.7 C) 97.7 F (36.5 C) 98 F (36.7 C)  TempSrc: Oral Oral Oral Oral  SpO2: 93% 96% 97% 96%  Weight:   219 lb (99.3 kg)   Height:       Weight change: 9 lb (4.082 kg)  Intake/Output Summary (Last 24 hours) at 07/31/2017 1609 Last data filed at 07/31/2017 1506 Gross per 24 hour  Intake 2720 ml  Output 2225 ml  Net 495 ml     Exam: Heart:: Regular rate and rhythm or S1S2 present Lungs: clear to auscultation Abdomen: right lower quadrant tenderness, no guarding or rigidity, normoactive bowel sounds, mildly distended, obese   Lab Results: CBC Latest Ref Rng & Units 07/29/2017 11/10/2016 06/23/2016  WBC 3.6 - 11.0 K/uL 11.3(H) 6.6 7.3  Hemoglobin 12.0 - 16.0 g/dL 13.5 13.2 13.2  Hematocrit 35.0 - 47.0 % 39.7 40.1 38.7  Platelets 150 - 440 K/uL 406 344 329   CMP Latest Ref Rng & Units 07/29/2017 11/10/2016 06/23/2016  Glucose 65 - 99 mg/dL 120(H) 111 105(H)  BUN 6 - 20 mg/dL 14 9 20   Creatinine 0.44 - 1.00 mg/dL 0.92 1.1 0.98  Sodium 135 - 145 mmol/L 137 143 137  Potassium 3.5 - 5.1 mmol/L 3.6 4.5 4.7  Chloride 101 - 111  mmol/L 103 103 100(L)  CO2 22 - 32 mmol/L 22 30 30   Calcium 8.9 - 10.3 mg/dL 9.3 9.8 9.3  Total Protein 6.5 - 8.1 g/dL 8.3(H) 7.4 7.7  Total Bilirubin 0.3 - 1.2 mg/dL 0.4 0.50 0.4  Alkaline Phos 38 - 126 U/L 63 66 66  AST 15 - 41 U/L 33 32 34  ALT 14 - 54 U/L 39 38 43    Micro Results: Recent Results (from the past 240 hour(s))  Urine Culture     Status: Abnormal   Collection Time: 07/30/17  9:19 AM  Result Value Ref Range Status   Specimen Description   Final    URINE, RANDOM Performed at [facility], [address] [place], [place] [address]    Special Requests   Final    NONE Performed at [facility], [address], [place], [place] [address]    Culture (A)  Final    <10,000 COLONIES/mL INSIGNIFICANT GROWTH Performed at [facility], [place] [address], [place],  [address]    Report Status 07/31/2017 FINAL  Final   Studies/Results: Ct Abdomen Pelvis W Contrast  Result Date: 07/29/2017 CLINICAL DATA:  52 y.o. female with a history of colon cancer status post partial colectomy for ago,  diverticulitis, A. fib, presenting with abdominal pain, nausea and vomiting. The patient reports that for the past 4 days she has had a progressively worsening lower more than upper abdominal discomfort. She has had associated nausea and vomiting and noticed that her stool was "pencil thin." She has not been having flatus. EXAM: CT ABDOMEN AND PELVIS WITH CONTRAST TECHNIQUE: Multidetector CT imaging of the abdomen and pelvis was performed using the standard protocol following bolus administration of intravenous contrast. CONTRAST:  150m ISOVUE-300 IOPAMIDOL (ISOVUE-300) INJECTION 61% COMPARISON:  11/10/2016 FINDINGS: Lower chest: Lung bases essentially clear.  Heart normal size. Hepatobiliary: Decreased attenuation of the liver consistent with fatty infiltration. No liver mass or focal lesion. Normal gallbladder. No bile duct dilation. Pancreas: Unremarkable. No  pancreatic ductal dilatation or surrounding inflammatory changes. Spleen: Normal in size without focal abnormality. Adrenals/Urinary Tract: No adrenal masses. Bilateral renal sinus cysts, larger on the right. Several subcentimeter low-density cortical lesions that are also likely cysts. Bilateral renal cortical thinning. No stones. No hydronephrosis. Normal ureters. Bladder is unremarkable. Stomach/Bowel: Mild thickening of the wall of the ileum with lies in the pelvis, with small amounts of adjacent fluid attenuation. Remainder of the small bowel is unremarkable. Bowel anastomosis staple line along the lower sigmoid colon. No colonic wall thickening or adjacent inflammation. There is no bowel distention to suggest obstruction. Stomach is unremarkable.  Appendix has been removed. Vascular/Lymphatic: No significant vascular findings are present. No enlarged abdominal or pelvic lymph nodes. Reproductive: Uterus and bilateral adnexa are unremarkable. Other: Small fat containing umbilical hernia, stable from the prior study. Musculoskeletal: No fracture or acute finding. No osteoblastic or osteolytic lesions. IMPRESSION: 1. Mild wall thickening of the distal ileum where it lies within the pelvis. There is a small amount of adjacent fluid attenuation, which may reflect inflammation or be due to physiologic free fluid in the pelvis. Suspect mild infectious or inflammatory ileitis. 2. No other evidence of an acute abnormality within the abdomen or pelvis. No colonic inflammation. 3. Hepatic steatosis. 4. Multiple renal cysts primarily renal sinus cysts, stable from the prior CT. Electronically Signed   By: DLajean ManesM.D.   On: 07/29/2017 21:39   Medications: I have reviewed the patient's current medications. Scheduled Meds: . busPIRone  10 mg Oral QPM  . diazepam  5 mg Oral BID  . docusate sodium  100 mg Oral BID  . DULoxetine  30 mg Oral TID  . enoxaparin (LOVENOX) injection  40 mg Subcutaneous Q24H  .  lisinopril  5 mg Oral Daily  . nicotine  21 mg Transdermal Daily  . ondansetron  4 mg Oral BID  . pantoprazole  40 mg Oral Daily  . polyethylene glycol-electrolytes  4,000 mL Oral Once  . potassium chloride  10 mEq Oral Daily  . spironolactone  50 mg Oral BID   Continuous Infusions: . sodium chloride 75 mL/hr at 07/31/17 0931   PRN Meds:.acetaminophen **OR** acetaminophen, HYDROcodone-acetaminophen, HYDROmorphone (DILAUDID) injection, morphine injection, ondansetron **OR** ondansetron (ZOFRAN) IV   Assessment: Active Problems:   Ileitis   Generalized abdominal pain patient has history of heavy NSAID use for the last 2 years, history of smoking 1 PPD for last 30 years. Differentials include NSAID-induced enteropathy or small bowel Crohn's disease or nonspecific enteritis  Plan: Clear liquid diet only Discontinue antibiotics Strongly advised patient to avoid all NSAID use Recommend to quit smoking Colonoscopy tomorrow Nothing by mouth past midnight Bowel prep as tolerated  I have discussed alternative options, risks & benefits,  which include, but are not limited to, bleeding, infection, perforation,respiratory complication & drug reaction.  The patient agrees with this plan & written consent will be obtained.      LOS: 1 day   Rohini Vanga 07/31/2017, 4:09 PM

## 2017-07-31 NOTE — Progress Notes (Signed)
Sheri Calderon at [place] NAME: Sheri Calderon    MR#:  [id number]  DATE OF BIRTH:  12/09/1965  SUBJECTIVE:   Continues to have mild lower abdominal pain. Some nausea which is chronic. No vomiting. No diarrhea no fever tolerating clear liquid REVIEW OF SYSTEMS:   Review of Systems  Constitutional: Negative for chills, fever and weight loss.  HENT: Negative for ear discharge, ear pain and nosebleeds.   Eyes: Negative for blurred vision, pain and discharge.  Respiratory: Negative for sputum production, shortness of breath, wheezing and stridor.   Cardiovascular: Negative for chest pain, palpitations, orthopnea and PND.  Gastrointestinal: Positive for abdominal pain and nausea. Negative for diarrhea and vomiting.  Genitourinary: Negative for frequency and urgency.  Musculoskeletal: Negative for back pain and joint pain.  Neurological: Negative for sensory change, speech change, focal weakness and weakness.  Psychiatric/Behavioral: Negative for depression and hallucinations. The patient is not nervous/anxious.    Tolerating Diet:CLD Tolerating PT: ambulatory  DRUG ALLERGIES:   Allergies  Allergen Reactions  . Lorazepam Hives and Swelling    Patient reports receiving lorazepam intensol in the hospital and experienced swelling with hives.    VITALS:  Blood pressure (!) 154/91, pulse 76, temperature 97.7 F (36.5 C), temperature source Oral, resp. rate 16, height 5' 7"  (1.702 m), weight 99.3 kg (219 lb), last menstrual period 10/12/2012, SpO2 97 %.  PHYSICAL EXAMINATION:   Physical Exam  GENERAL:  52 y.o.-year-old patient lying in the bed with no acute distress.  EYES: Pupils equal, round, reactive to light and accommodation. No scleral icterus. Extraocular muscles intact.  HEENT: Head atraumatic, normocephalic. Oropharynx and nasopharynx clear.  NECK:  Supple, no jugular venous distention. No thyroid enlargement, no tenderness.   LUNGS: Normal breath sounds bilaterally, no wheezing, rales, rhonchi. No use of accessory muscles of respiration.  CARDIOVASCULAR: S1, S2 normal. No murmurs, rubs, or gallops.  ABDOMEN: Soft, left LQ tender, nondistended. Bowel sounds present. No organomegaly or mass.  EXTREMITIES: No cyanosis, clubbing or edema b/l.    NEUROLOGIC: Cranial nerves II through XII are intact. No focal Motor or sensory deficits b/l.   PSYCHIATRIC:  patient is alert and oriented x 3.  SKIN: No obvious rash, lesion, or ulcer.   LABORATORY PANEL:  CBC Recent Labs  Lab 07/29/17 1926  WBC 11.3*  HGB 13.5  HCT 39.7  PLT 406    Chemistries  Recent Labs  Lab 07/29/17 1926  NA 137  K 3.6  CL 103  CO2 22  GLUCOSE 120*  BUN 14  CREATININE 0.92  CALCIUM 9.3  AST 33  ALT 39  ALKPHOS 63  BILITOT 0.4   Cardiac Enzymes No results for input(s): TROPONINI in the last 168 hours. RADIOLOGY:  Ct Abdomen Pelvis W Contrast  Result Date: 07/29/2017 CLINICAL DATA:  52 y.o. female with a history of colon cancer status post partial colectomy for ago, diverticulitis, A. fib, presenting with abdominal pain, nausea and vomiting. The patient reports that for the past 4 days she has had a progressively worsening lower more than upper abdominal discomfort. She has had associated nausea and vomiting and noticed that her stool was "pencil thin." She has not been having flatus. EXAM: CT ABDOMEN AND PELVIS WITH CONTRAST TECHNIQUE: Multidetector CT imaging of the abdomen and pelvis was performed using the standard protocol following bolus administration of intravenous contrast. CONTRAST:  168m ISOVUE-300 IOPAMIDOL (ISOVUE-300) INJECTION 61% COMPARISON:  11/10/2016 FINDINGS: Lower chest: Lung bases essentially  clear.  Heart normal size. Hepatobiliary: Decreased attenuation of the liver consistent with fatty infiltration. No liver mass or focal lesion. Normal gallbladder. No bile duct dilation. Pancreas: Unremarkable. No pancreatic  ductal dilatation or surrounding inflammatory changes. Spleen: Normal in size without focal abnormality. Adrenals/Urinary Tract: No adrenal masses. Bilateral renal sinus cysts, larger on the right. Several subcentimeter low-density cortical lesions that are also likely cysts. Bilateral renal cortical thinning. No stones. No hydronephrosis. Normal ureters. Bladder is unremarkable. Stomach/Bowel: Mild thickening of the wall of the ileum with lies in the pelvis, with small amounts of adjacent fluid attenuation. Remainder of the small bowel is unremarkable. Bowel anastomosis staple line along the lower sigmoid colon. No colonic wall thickening or adjacent inflammation. There is no bowel distention to suggest obstruction. Stomach is unremarkable.  Appendix has been removed. Vascular/Lymphatic: No significant vascular findings are present. No enlarged abdominal or pelvic lymph nodes. Reproductive: Uterus and bilateral adnexa are unremarkable. Other: Small fat containing umbilical hernia, stable from the prior study. Musculoskeletal: No fracture or acute finding. No osteoblastic or osteolytic lesions. IMPRESSION: 1. Mild wall thickening of the distal ileum where it lies within the pelvis. There is a small amount of adjacent fluid attenuation, which may reflect inflammation or be due to physiologic free fluid in the pelvis. Suspect mild infectious or inflammatory ileitis. 2. No other evidence of an acute abnormality within the abdomen or pelvis. No colonic inflammation. 3. Hepatic steatosis. 4. Multiple renal cysts primarily renal sinus cysts, stable from the prior CT. Electronically Signed   By: Sheri Calderon M.D.   On: 07/29/2017 21:39   ASSESSMENT AND PLAN:   52 year old female admitted for ileitis.  1.Distal small bowel Ileitis: - Small caliber stools and worsening abdominal pain. - started the patient on rifaximin.  Consult gastroenterology to see if pt needs colonoscopy while she is a here or can be done as  outpatient after course of antibiotic -currently on IV Cipro and Flagyl  2.  Intractable abdominal pain: IV morphine and Dilaudid as needed.  Questionable history of abuse of pain medication.  Analgesia with caution  -it has chronic nausea since after for chemotherapy and radiation therapy according to her she takes scheduled Zofran -she used to be on chronic narcotics along with methadone however she tells me she is not taking it anymore  3.  DVT prophylaxis: Lovenox  4. History of colon cancer status post surgery chemo and radiation four years ago. -Colonoscopy couple years ago was essentially negative -follows with oncology at [facility].  Patient wants to eat regular food. I will wait for G.I. to come by assessor and then decide on diet till then will advanced to full liquid.   Case discussed with Care Management/Social Worker. Management plans discussed with the patient, family and they are in agreement.  CODE STATUS: full  DVT Prophylaxis: ambulatory  TOTAL TIME TAKING CARE OF THIS PATIENT: *30* minutes.  >50% time spent on counselling and coordination of care  POSSIBLE D/C IN 1-2 DAYS, DEPENDING ON CLINICAL CONDITION.  Note: This dictation was prepared with Dragon dictation along with smaller phrase technology. Any transcriptional errors that result from this process are unintentional.  Fritzi Mandes M.D on 07/31/2017 at 7:58 AM  Between 7am to 6pm - Pager - [phone]  After 6pm go to [email or web address] - password [email or web address] [facility]  Office  [phone]  CC: Primary care physician; Alvester Chou, NPPatient ID: Sheri Calderon, female   DOB: [birth year], 52 y.o.  MRN: [id number]

## 2017-08-01 ENCOUNTER — Inpatient Hospital Stay: Payer: Commercial Managed Care - PPO | Admitting: Anesthesiology

## 2017-08-01 ENCOUNTER — Encounter: Payer: Self-pay | Admitting: Anesthesiology

## 2017-08-01 ENCOUNTER — Encounter
Admission: EM | Disposition: A | Discharge status: Home / Self Care | Payer: Self-pay | Source: Home / Self Care | Attending: Internal Medicine

## 2017-08-01 DIAGNOSIS — R933 Abnormal findings on diagnostic imaging of other parts of digestive tract: Secondary | ICD-10-CM

## 2017-08-01 DIAGNOSIS — K621 Rectal polyp: Secondary | ICD-10-CM

## 2017-08-01 DIAGNOSIS — R1031 Right lower quadrant pain: Secondary | ICD-10-CM

## 2017-08-01 DIAGNOSIS — K6389 Other specified diseases of intestine: Secondary | ICD-10-CM

## 2017-08-01 HISTORY — PX: COLONOSCOPY: SHX5424

## 2017-08-01 SURGERY — COLONOSCOPY
Anesthesia: General

## 2017-08-01 MED ORDER — PROPOFOL 10 MG/ML IV BOLUS
INTRAVENOUS | Status: DC | PRN
  Administered 2017-08-01: 70 mg via INTRAVENOUS
  Administered 2017-08-01: 30 mg via INTRAVENOUS

## 2017-08-01 MED ORDER — LIDOCAINE HCL (CARDIAC) PF 100 MG/5ML IV SOSY
PREFILLED_SYRINGE | INTRAVENOUS | Status: DC | PRN
  Administered 2017-08-01: 50 mg via INTRAVENOUS

## 2017-08-01 MED ORDER — DOCUSATE SODIUM 100 MG PO CAPS: 100.0000 mg | ORAL_CAPSULE | Freq: Two times a day (BID) | ORAL | 0 refills | Status: DC | PRN

## 2017-08-01 MED ORDER — LABETALOL HCL 5 MG/ML IV SOLN
INTRAVENOUS | Status: DC | PRN
  Administered 2017-08-01: 5 mg via INTRAVENOUS

## 2017-08-01 MED ORDER — BUSPIRONE HCL 10 MG PO TABS: 10.0000 mg | ORAL_TABLET | Freq: Every evening | ORAL | Status: DC

## 2017-08-01 MED ORDER — PROPOFOL 500 MG/50ML IV EMUL
INTRAVENOUS | Status: DC | PRN
  Administered 2017-08-01: 130 ug/kg/min via INTRAVENOUS

## 2017-08-01 MED ORDER — LIDOCAINE HCL (PF) 2 % IJ SOLN
INTRAMUSCULAR | Status: AC
  Filled 2017-08-01: qty 10

## 2017-08-01 MED ORDER — PROPOFOL 500 MG/50ML IV EMUL
INTRAVENOUS | Status: AC
  Filled 2017-08-01: qty 50

## 2017-08-01 MED ORDER — SPIRONOLACTONE 50 MG PO TABS
50.0000 mg | ORAL_TABLET | Freq: Two times a day (BID) | ORAL | 0 refills | Status: DC
Start: 2017-08-01 — End: 2018-05-13

## 2017-08-01 MED ORDER — HYDROCODONE-ACETAMINOPHEN 5-325 MG PO TABS: 1.0000 | ORAL_TABLET | Freq: Four times a day (QID) | ORAL | 0 refills | Status: DC | PRN

## 2017-08-01 MED ORDER — ACETAMINOPHEN 325 MG PO TABS: 325.0000 mg | ORAL_TABLET | Freq: Four times a day (QID) | ORAL | Status: DC | PRN

## 2017-08-01 MED ORDER — NICOTINE 21 MG/24HR TD PT24: 21.0000 mg | MEDICATED_PATCH | Freq: Every day | TRANSDERMAL | 0 refills | Status: DC

## 2017-08-01 NOTE — Anesthesia Postprocedure Evaluation (Signed)
Anesthesia Post Note  Patient: Sheri Calderon  Procedure(s) Performed: COLONOSCOPY (N/A )  Patient location during evaluation: PACU Anesthesia Type: General Level of consciousness: awake and alert Pain management: pain level controlled Vital Signs Assessment: post-procedure vital signs reviewed and stable Respiratory status: spontaneous breathing, nonlabored ventilation, respiratory function stable and patient connected to nasal cannula oxygen Cardiovascular status: blood pressure returned to baseline and stable Postop Assessment: no apparent nausea or vomiting Anesthetic complications: no     Last Vitals:  Vitals:   08/01/17 1118 08/01/17 1128  BP: (!) 157/88 (!) 159/82  Pulse: 65 74  Resp: (!) 21 18  Temp:  36.6 C  SpO2: 97% 96%    Last Pain:  Vitals:   08/01/17 1128  TempSrc:   PainSc: 4                  Precious Haws Jazper Nikolai

## 2017-08-01 NOTE — Op Note (Signed)
[facility] Gastroenterology Patient Name: Sheri Calderon Procedure Date: 08/01/2017 10:14 AM MRN: [id number] Account #: [id number] Date of Birth: 1965/07/03 Admit Type: Inpatient Age: 51 Room: [facility] ENDO ROOM 4 Gender: Female Note Status: Finalized Procedure:            Colonoscopy Indications:          Abdominal pain in the right lower quadrant, , Abnormal                        CT of the GI tract, ileitis, heavy NSAID use Providers:            Lin Landsman MD, MD Referring MD:         Alvester Chou (Referring MD), Rudell Cobb. Marin Olp MD, MD                        (Referring MD) Medicines:            Monitored Anesthesia Care Complications:        No immediate complications. Estimated blood loss:                        Minimal. Procedure:            Pre-Anesthesia Assessment:                       - Prior to the procedure, a History and Physical was                        performed, and patient medications and allergies were                        reviewed. The patient is competent. The risks and                        benefits of the procedure and the sedation options and                        risks were discussed with the patient. All questions                        were answered and informed consent was obtained.                        Patient identification and proposed procedure were                        verified by the physician, the nurse, the                        anesthesiologist, the anesthetist and the technician in                        the pre-procedure area in the procedure room in the                        endoscopy suite. Mental Status Examination: alert and                        oriented. Airway Examination: normal oropharyngeal  airway and neck mobility. Respiratory Examination:                        clear to auscultation. CV Examination: normal.                        Prophylactic Antibiotics: The patient does not  require                        prophylactic antibiotics. Prior Anticoagulants: The                        patient has taken ibuprofen. ASA Grade Assessment: III                        - A patient with severe systemic disease. After                        reviewing the risks and benefits, the patient was                        deemed in satisfactory condition to undergo the                        procedure. The anesthesia plan was to use monitored                        anesthesia care (MAC). Immediately prior to                        administration of medications, the patient was                        re-assessed for adequacy to receive sedatives. The                        heart rate, respiratory rate, oxygen saturations, blood                        pressure, adequacy of pulmonary ventilation, and                        response to care were monitored throughout the                        procedure. The physical status of the patient was                        re-assessed after the procedure.                       After obtaining informed consent, the colonoscope was                        passed under direct vision. Throughout the procedure,                        the patient's blood pressure, pulse, and oxygen                        saturations were monitored continuously. The  Colonoscope was introduced through the anus and                        advanced to the 10 cm into the ileum. The colonoscopy                        was performed without difficulty. The patient tolerated                        the procedure well. The quality of the bowel                        preparation was fair. Findings:      The perianal and digital rectal examinations were normal. Pertinent       negatives include normal sphincter tone and no palpable rectal lesions.      A scattered and patchy areas of mucosa in the terminal ileum was mildly       erythematous, edematous, friable.  Biopsies were taken with a cold       forceps for histology.      A 5 mm polyp was found in the ascending colon. The polyp was sessile.       The polyp was removed with a cold snare. Resection and retrieval were       complete.      A 7 mm polyp was found in the transverse colon. The polyp was sessile.       The polyp was removed with a hot snare. Resection and retrieval were       complete.      A diminutive polyp was found in the rectum. The polyp was sessile. The       polyp was removed with a cold biopsy forceps. Resection and retrieval       were complete.      Normal mucosa was found in the entire colon.      The retroflexed view of the distal rectum and anal verge was normal and       showed no anal or rectal abnormalities. Impression:           - Preparation of the colon was fair.                       - Erythematous mucosa in the terminal ileum. Biopsied.                       - One 5 mm polyp in the ascending colon, removed with a                        cold snare. Resected and retrieved.                       - One 7 mm polyp in the transverse colon, removed with                        a hot snare. Resected and retrieved.                       - One diminutive polyp in the rectum, removed with a  cold biopsy forceps. Resected and retrieved.                       - Normal mucosa in the entire examined colon.                       - The distal rectum and anal verge are normal on                        retroflexion view. Recommendation:       - Return patient to hospital ward for possible                        discharge same day.                       - Resume regular diet today.                       - Continue present medications.                       - No ibuprofen, naproxen, or other non-steroidal                        anti-inflammatory drugs.                       - Await pathology results.                       - Return to GI clinic in 4  weeks. Procedure Code(s):    --- Professional ---                       [phone], Colonoscopy, flexible; with removal of tumor(s),                        polyp(s), or other lesion(s) by snare technique                       45380, 1, Colonoscopy, flexible; with biopsy, single                        or multiple Diagnosis Code(s):    --- Professional ---                       K63.89, Other specified diseases of intestine                       D12.2, Benign neoplasm of ascending colon                       K62.1, Rectal polyp                       D12.3, Benign neoplasm of transverse colon (hepatic                        flexure or splenic flexure)                       R10.31, Right lower quadrant pain  R93.3, Abnormal findings on diagnostic imaging of other                        parts of digestive tract CPT copyright 2017 [employer]. All rights reserved. The codes documented in this report are preliminary and upon coder review may  be revised to meet current compliance requirements. Dr. Ulyess Mort Lin Landsman MD, MD 08/01/2017 10:58:22 AM This report has been signed electronically. Number of Addenda: 0 Note Initiated On: 08/01/2017 10:14 AM Scope Withdrawal Time: 0 hours 22 minutes 37 seconds  Total Procedure Duration: 0 hours 23 minutes 58 seconds       [facility]

## 2017-08-01 NOTE — Progress Notes (Signed)
IV removed, tip intact, hemostasis achieved. Pt verbalizes understanding of discharge instructions. Scripts with d/c papers. Pt and family present and time of discharge. Pt to be d/c home via private vehicle, wheelchair to car. NAD. PT denies questions or needs at this time.

## 2017-08-01 NOTE — Anesthesia Procedure Notes (Signed)
Performed by: Lance Muss, CRNA Pre-anesthesia Checklist: Patient identified, Emergency Drugs available, Suction available, Patient being monitored and Timeout performed Patient Re-evaluated:Patient Re-evaluated prior to induction Oxygen Delivery Method: Nasal cannula Induction Type: IV induction

## 2017-08-01 NOTE — Discharge Summary (Signed)
[facility] [place] NAME: Sheri Calderon    MR#:  [id number]  DATE OF BIRTH:  05-25-1965  DATE OF ADMISSION:  07/29/2017 ADMITTING PHYSICIAN: Harrie Foreman, MD  DATE OF DISCHARGE:  08/01/17  PRIMARY CARE PHYSICIAN: Alvester Chou, NP    ADMISSION DIAGNOSIS:  Generalized abdominal pain [R10.84] Abdominal pain, unspecified abdominal location [R10.9] Non-intractable vomiting with nausea, unspecified vomiting type [R11.2]  DISCHARGE DIAGNOSIS:  Active Problems:   Ileitis   Generalized abdominal pain   SECONDARY DIAGNOSIS:   Past Medical History:  Diagnosis Date  . Allergic rhinitis   . Anxiety   . Ascites    acute  . Atrial fibrillation ([place])    resolved - does not see cardiologist  . Colon cancer [facility]) 2014   cancer of the sigmoid colon  . Depression   . Diverticulitis    "this is my 2nd time in hospital w/this in the last 2 wk" (06/21/2012)  . GERD (gastroesophageal reflux disease)   . Gout   . Hydronephrosis   . Hypertension   . Lower extremity edema   . Migraines   . Osteoarthritis   . Peripheral neuropathy   . PTSD (post-traumatic stress disorder)   . Restless legs syndrome   . S/P chemotherapy, time since less than 4 weeks   . Tremor, essential     HOSPITAL COURSE:  HPI: The patient with past medical history of colon cancer status post chemotherapy and radiation, paroxysmal atrial fibrillation and hypertension presents to the emergency department with abdominal pain.  The patient states that she has had excruciating lower abdominal pain for 3 days.  She has had subjective fevers as well as nausea and vomiting.  She is also noticed that the caliber of her stool has changed from normal to very narrow.  She states that it is hard for her to have a bowel movement but her stool comes out soft.  She is concerned because this is the way her colon cancer presented.  She has been in remission for 5 years.   #1 acute right  lower quadrant abdominal pain with nausea-differential diagnosis - most likely from  NSAID induced enteropathy ; other differentials small bowel Crohn's disease or nonspecific enteritis  Stop using NSAIDs including ibuprofen, naproxen Patient had colonoscopy on 08/01/2017-erythematous mucosa in the terminal ileum, polyps in the colon were removed and sent for biopsy Discontinued IV antibiotics and rifaximin Pain management as needed with Percocet and outpatient follow-up with gastroenterology in 4 weeks  #History of colon cancer status post surgery, chemoradiation 4 years ago Follow-up with primary  oncology at [facility]  #Essential hypertension continue home medication lisinopril and titrate as needed  #Tobacco abuse disorder counseled patient to quit smoking She verbalized understanding of the plan  DISCHARGE CONDITIONS:   Stable  CONSULTS OBTAINED:     PROCEDURES colonoscopy on 08/01/2017  DRUG ALLERGIES:   Allergies  Allergen Reactions  . Lorazepam Hives and Swelling    Patient reports receiving lorazepam intensol in the hospital and experienced swelling with hives.    DISCHARGE MEDICATIONS:   Allergies as of 08/01/2017      Reactions   Lorazepam Hives, Swelling   Patient reports receiving lorazepam intensol in the hospital and experienced swelling with hives.      Medication List    STOP taking these medications   diazepam 5 MG tablet Commonly known as:  VALIUM   hyaluronate sodium Gel   methylPREDNISolone 4 MG  Tbpk tablet Commonly known as:  MEDROL DOSEPAK   potassium chloride 10 MEQ tablet Commonly known as:  K-DUR     TAKE these medications   acetaminophen 325 MG tablet Commonly known as:  TYLENOL Take 1 tablet (325 mg total) by mouth every 6 (six) hours as needed for mild pain or headache (or Fever >/= 101).   ALPRAZolam 0.5 MG tablet Commonly known as:  XANAX Take 0.5 mg by mouth 3 (three) times daily as needed for anxiety.   busPIRone 10 MG  tablet Commonly known as:  BUSPAR Take 1 tablet (10 mg total) by mouth every evening.   docusate sodium 100 MG capsule Commonly known as:  COLACE Take 1 capsule (100 mg total) by mouth 2 (two) times daily as needed for mild constipation.   DULoxetine 30 MG capsule Commonly known as:  CYMBALTA Take 60 mg by mouth daily.   HYDROcodone-acetaminophen 5-325 MG tablet Commonly known as:  NORCO/VICODIN Take 1-2 tablets by mouth every 6 (six) hours as needed for moderate pain or severe pain.   lisinopril 10 MG tablet Commonly known as:  PRINIVIL,ZESTRIL Take 10 mg by mouth daily.   nicotine 21 mg/24hr patch Commonly known as:  NICODERM CQ - dosed in mg/24 hours Place 1 patch (21 mg total) onto the skin daily. Start taking on:  08/02/2017   omeprazole 20 MG capsule Commonly known as:  PRILOSEC Take 20 mg by mouth daily.   ondansetron 4 MG tablet Commonly known as:  ZOFRAN Take 4 mg by mouth every 6 (six) hours as needed for nausea or vomiting.   rizatriptan 10 MG tablet Commonly known as:  MAXALT Take 10 mg by mouth See admin instructions. Take 10 mg by mouth at onset of headache. Repeat in 2 hours if needed. Maximum 2 tablets in 24 hours.   spironolactone 50 MG tablet Commonly known as:  ALDACTONE Take 1 tablet (50 mg total) by mouth 2 (two) times daily.   traZODone 150 MG tablet Commonly known as:  DESYREL Take 150-300 mg by mouth at bedtime. Take 150 mg by mouth at bedtime. May repeat 1 dose if ineffective.        DISCHARGE INSTRUCTIONS:   Follow-up with primary care physician in 5 days Follow-up with gastroenterology Dr. Marius Ditch in 4 weeks  DIET:  Cardiac diet  DISCHARGE CONDITION:  Stable  ACTIVITY:  Activity as tolerated  OXYGEN:  Home Oxygen: No.   Oxygen Delivery: room air  DISCHARGE LOCATION:  home   If you experience worsening of your admission symptoms, develop shortness of breath, life threatening emergency, suicidal or homicidal thoughts you must  seek medical attention immediately by calling 911 or calling your MD immediately  if symptoms less severe.  You Must read complete instructions/literature along with all the possible adverse reactions/side effects for all the Medicines you take and that have been prescribed to you. Take any new Medicines after you have completely understood and accpet all the possible adverse reactions/side effects.   Please note  You were cared for by a hospitalist during your hospital stay. If you have any questions about your discharge medications or the care you received while you were in the hospital after you are discharged, you can call the unit and asked to speak with the hospitalist on call if the hospitalist that took care of you is not available. Once you are discharged, your primary care physician will handle any further medical issues. Please note that NO REFILLS for any discharge medications  will be authorized once you are discharged, as it is imperative that you return to your primary care physician (or establish a relationship with a primary care physician if you do not have one) for your aftercare needs so that they can reassess your need for medications and monitor your lab values.     Today  Chief Complaint  Patient presents with  . Abdominal Pain   Patient's abdominal pain significantly improved.  Had colonoscopy today and okay to discharge patient from GI standpoint  ROS:  CONSTITUTIONAL: Denies fevers, chills. Denies any fatigue, weakness.  EYES: Denies blurry vision, double vision, eye pain. EARS, NOSE, THROAT: Denies tinnitus, ear pain, hearing loss. RESPIRATORY: Denies cough, wheeze, shortness of breath.  CARDIOVASCULAR: Denies chest pain, palpitations, edema.  GASTROINTESTINAL: Denies nausea, vomiting, diarrhea, abdominal pain. Denies bright red blood per rectum. GENITOURINARY: Denies dysuria, hematuria. ENDOCRINE: Denies nocturia or thyroid problems. HEMATOLOGIC AND LYMPHATIC:  Denies easy bruising or bleeding. SKIN: Denies rash or lesion. MUSCULOSKELETAL: Denies pain in neck, back, shoulder, knees, hips or arthritic symptoms.  NEUROLOGIC: Denies paralysis, paresthesias.  PSYCHIATRIC: Denies anxiety or depressive symptoms.   VITAL SIGNS:  Blood pressure (!) 143/94, pulse 86, temperature 97.7 F (36.5 C), temperature source Oral, resp. rate 18, height 5' 7"  (1.702 m), weight 99.2 kg (218 lb 9.6 oz), last menstrual period 10/12/2012, SpO2 93 %.  I/O:    Intake/Output Summary (Last 24 hours) at 08/01/2017 1436 Last data filed at 08/01/2017 1052 Gross per 24 hour  Intake 620 ml  Output 850 ml  Net -230 ml    PHYSICAL EXAMINATION:  GENERAL:  52 y.o.-year-old patient lying in the bed with no acute distress.  EYES: Pupils equal, round, reactive to light and accommodation. No scleral icterus. Extraocular muscles intact.  HEENT: Head atraumatic, normocephalic. Oropharynx and nasopharynx clear.  NECK:  Supple, no jugular venous distention. No thyroid enlargement, no tenderness.  LUNGS: Normal breath sounds bilaterally, no wheezing, rales,rhonchi or crepitation. No use of accessory muscles of respiration.  CARDIOVASCULAR: S1, S2 normal. No murmurs, rubs, or gallops.  ABDOMEN: Soft, non-tender, non-distended. Bowel sounds present. No organomegaly or mass.  EXTREMITIES: No pedal edema, cyanosis, or clubbing.  NEUROLOGIC: Cranial nerves II through XII are intact. Muscle strength 5/5 in all extremities. Sensation intact. Gait not checked.  PSYCHIATRIC: The patient is alert and oriented x 3.  SKIN: No obvious rash, lesion, or ulcer.   DATA REVIEW:   CBC Recent Labs  Lab 07/29/17 1926  WBC 11.3*  HGB 13.5  HCT 39.7  PLT 406    Chemistries  Recent Labs  Lab 07/29/17 1926  NA 137  K 3.6  CL 103  CO2 22  GLUCOSE 120*  BUN 14  CREATININE 0.92  CALCIUM 9.3  AST 33  ALT 39  ALKPHOS 63  BILITOT 0.4    Cardiac Enzymes No results for input(s): TROPONINI  in the last 168 hours.  Microbiology Results  Results for orders placed or performed during the hospital encounter of 07/29/17  Urine Culture     Status: Abnormal   Collection Time: 07/30/17  9:19 AM  Result Value Ref Range Status   Specimen Description   Final    URINE, RANDOM Performed at [facility], [address] [place], [place] [address]    Special Requests   Final    NONE Performed at [facility], [address] [place], [place] [address]    Culture (A)  Final    <10,000 COLONIES/mL INSIGNIFICANT GROWTH  Performed at [facility], [place] [address], [place], [place] [address]    Report Status 07/31/2017 FINAL  Final    RADIOLOGY:  Ct Abdomen Pelvis W Contrast  Result Date: 07/29/2017 CLINICAL DATA:  52 y.o. female with a history of colon cancer status post partial colectomy for ago, diverticulitis, A. fib, presenting with abdominal pain, nausea and vomiting. The patient reports that for the past 4 days she has had a progressively worsening lower more than upper abdominal discomfort. She has had associated nausea and vomiting and noticed that her stool was "pencil thin." She has not been having flatus. EXAM: CT ABDOMEN AND PELVIS WITH CONTRAST TECHNIQUE: Multidetector CT imaging of the abdomen and pelvis was performed using the standard protocol following bolus administration of intravenous contrast. CONTRAST:  175m ISOVUE-300 IOPAMIDOL (ISOVUE-300) INJECTION 61% COMPARISON:  11/10/2016 FINDINGS: Lower chest: Lung bases essentially clear.  Heart normal size. Hepatobiliary: Decreased attenuation of the liver consistent with fatty infiltration. No liver mass or focal lesion. Normal gallbladder. No bile duct dilation. Pancreas: Unremarkable. No pancreatic ductal dilatation or surrounding inflammatory changes. Spleen: Normal in size without focal abnormality. Adrenals/Urinary Tract: No adrenal masses. Bilateral renal sinus cysts, larger on the right. Several  subcentimeter low-density cortical lesions that are also likely cysts. Bilateral renal cortical thinning. No stones. No hydronephrosis. Normal ureters. Bladder is unremarkable. Stomach/Bowel: Mild thickening of the wall of the ileum with lies in the pelvis, with small amounts of adjacent fluid attenuation. Remainder of the small bowel is unremarkable. Bowel anastomosis staple line along the lower sigmoid colon. No colonic wall thickening or adjacent inflammation. There is no bowel distention to suggest obstruction. Stomach is unremarkable.  Appendix has been removed. Vascular/Lymphatic: No significant vascular findings are present. No enlarged abdominal or pelvic lymph nodes. Reproductive: Uterus and bilateral adnexa are unremarkable. Other: Small fat containing umbilical hernia, stable from the prior study. Musculoskeletal: No fracture or acute finding. No osteoblastic or osteolytic lesions. IMPRESSION: 1. Mild wall thickening of the distal ileum where it lies within the pelvis. There is a small amount of adjacent fluid attenuation, which may reflect inflammation or be due to physiologic free fluid in the pelvis. Suspect mild infectious or inflammatory ileitis. 2. No other evidence of an acute abnormality within the abdomen or pelvis. No colonic inflammation. 3. Hepatic steatosis. 4. Multiple renal cysts primarily renal sinus cysts, stable from the prior CT. Electronically Signed   By: DLajean ManesM.D.   On: 07/29/2017 21:39    EKG:   Orders placed or performed during the hospital encounter of 06/23/16  . ED EKG within 10 minutes  . ED EKG within 10 minutes  . EKG      Management plans discussed with the patient, family and they are in agreement.  CODE STATUS:     Code Status Orders  (From admission, onward)        Start     Ordered   07/30/17 0108  Full code  Continuous     07/30/17 0107    Code Status History    Date Active Date Inactive Code Status Order ID Comments User Context    06/28/2012 1233 07/04/2012 1222 Full Code [id number] BHarl Bowie MD Inpatient   06/21/2012 1803 06/28/2012 1233 Full Code [id number] RRalene Ok MD Inpatient   06/08/2012 0244 06/10/2012 1818 Full Code [id number] RBynum Bellows MD Inpatient      TOTAL TIME TAKING CARE OF THIS PATIENT: 42 minutes.   Note: This  dictation was prepared with Dragon dictation along with smaller phrase technology. Any transcriptional errors that result from this process are unintentional.   @[facility] @  on 08/01/2017 at 2:36 PM  Between 7am to 6pm - Pager - [phone]  After 6pm go to [email or web address] - password [email or web address] [facility]  [facility]  Office  [phone]  CC: Primary care physician; Alvester Chou, NP

## 2017-08-01 NOTE — Discharge Instructions (Signed)
° °  Abdominal Pain, Adult Many things can cause belly (abdominal) pain. Most times, belly pain is not dangerous. Many cases of belly pain can be watched and treated at home. Sometimes belly pain is serious, though. Your doctor will try to find the cause of your belly pain. Follow these instructions at home:  Take over-the-counter and prescription medicines only as told by your doctor. Do not take medicines that help you poop (laxatives) unless told to by your doctor.  Drink enough fluid to keep your pee (urine) clear or pale yellow.  Watch your belly pain for any changes.  Keep all follow-up visits as told by your doctor. This is important. Contact a doctor if:  Your belly pain changes or gets worse.  You are not hungry, or you lose weight without trying.  You are having trouble pooping (constipated) or have watery poop (diarrhea) for more than 2-3 days.  You have pain when you pee or poop.  Your belly pain wakes you up at night.  Your pain gets worse with meals, after eating, or with certain foods.  You are throwing up and cannot keep anything down.  You have a fever. Get help right away if:  Your pain does not go away as soon as your doctor says it should.  You cannot stop throwing up.  Your pain is only in areas of your belly, such as the right side or the left lower part of the belly.  You have bloody or black poop, or poop that looks like tar.  You have very bad pain, cramping, or bloating in your belly.  You have signs of not having enough fluid or water in your body (dehydration), such as: ? Dark pee, very little pee, or no pee. ? Cracked lips. ? Dry mouth. ? Sunken eyes. ? Sleepiness. ? Weakness. This information is not intended to replace advice given to you by your health care provider. Make sure you discuss any questions you have with your health care provider. Document Released: 08/05/2007 Document Revised: 09/06/2015 Document Reviewed: 07/31/2015 Elsevier  Interactive Patient Education  2018 [place] with primary care physician in 5 days Follow-up with gastroenterology Dr. Marius Ditch in 4 weeks

## 2017-08-01 NOTE — Anesthesia Preprocedure Evaluation (Signed)
Anesthesia Evaluation  Patient identified by MRN, date of birth, ID band Patient awake    Reviewed: Allergy & Precautions, H&P , NPO status , Patient's Chart, lab work & pertinent test results  Airway Mallampati: III  TM Distance: <3 FB Neck ROM: full    Dental  (+) Chipped, Poor Dentition, Missing   Pulmonary neg shortness of breath, COPD, Current Smoker,           Cardiovascular Exercise Tolerance: Good hypertension,      Neuro/Psych  Headaches, PSYCHIATRIC DISORDERS Anxiety Depression  Neuromuscular disease    GI/Hepatic Neg liver ROS, GERD  Medicated and Controlled,  Endo/Other  negative endocrine ROS  Renal/GU Renal disease  negative genitourinary   Musculoskeletal  (+) Arthritis ,   Abdominal   Peds  Hematology negative hematology ROS (+)   Anesthesia Other Findings Past Medical History: No date: Allergic rhinitis No date: Anxiety No date: Ascites     Comment:  acute No date: Atrial fibrillation ([place])     Comment:  resolved - does not see cardiologist 2014: Colon cancer ([place])     Comment:  cancer of the sigmoid colon No date: Depression No date: Diverticulitis     Comment:  "this is my 2nd time in hospital w/this in the last 2               wk" (06/21/2012) No date: GERD (gastroesophageal reflux disease) No date: Gout No date: Hydronephrosis No date: Hypertension No date: Lower extremity edema No date: Migraines No date: Osteoarthritis No date: Peripheral neuropathy No date: PTSD (post-traumatic stress disorder) No date: Restless legs syndrome No date: S/P chemotherapy, time since less than 4 weeks No date: Tremor, essential  Past Surgical History: No date: APPENDECTOMY 06/28/2012: PARTIAL COLECTOMY; N/A     Comment:  Procedure: PARTIAL COLECTOMY;  Surgeon: Harl Bowie, MD;  Location: [facility];  Service: General;                Laterality: N/A; 07/11/2012: PORTACATH PLACEMENT;  N/A     Comment:  Procedure: INSERTION PORT-A-CATH;  Surgeon: Harl Bowie, MD;  Location: [facility];  Service: General;                Laterality: N/A; No date: portacath removal ~ 1989: TUBAL LIGATION  BMI    Body Mass Index:  34.24 kg/m      Reproductive/Obstetrics negative OB ROS                             Anesthesia Physical Anesthesia Plan  ASA: III  Anesthesia Plan: General   Post-op Pain Management:    Induction: Intravenous  PONV Risk Score and Plan: Propofol infusion and TIVA  Airway Management Planned: Natural Airway and Nasal Cannula  Additional Equipment:   Intra-op Plan:   Post-operative Plan:   Informed Consent: I have reviewed the patients History and Physical, chart, labs and discussed the procedure including the risks, benefits and alternatives for the proposed anesthesia with the patient or authorized representative who has indicated his/her understanding and acceptance.   Dental Advisory Given  Plan Discussed with: Anesthesiologist, CRNA and Surgeon  Anesthesia Plan Comments: (Patient consented for risks of anesthesia including but not limited to:  - adverse reactions to medications - risk of intubation  if required - damage to teeth, lips or other oral mucosa - sore throat or hoarseness - Damage to heart, brain, lungs or loss of life  Patient voiced understanding.)        Anesthesia Quick Evaluation

## 2017-08-01 NOTE — Anesthesia Post-op Follow-up Note (Signed)
Anesthesia QCDR form completed.        

## 2017-08-01 NOTE — Transfer of Care (Signed)
Immediate Anesthesia Transfer of Care Note  Patient: Sheri Calderon  Procedure(s) Performed: COLONOSCOPY (N/A )  Patient Location: PACU  Anesthesia Type:General  Level of Consciousness: sedated and responds to stimulation  Airway & Oxygen Therapy: Patient Spontanous Breathing and Patient connected to nasal cannula oxygen  Post-op Assessment: Report given to RN and Post -op Vital signs reviewed and stable  Post vital signs: Reviewed and stable  Last Vitals:  Vitals Value Taken Time  BP 141/89 08/01/2017 10:58 AM  Temp 36.6 C 08/01/2017 10:56 AM  Pulse 81 08/01/2017 10:58 AM  Resp 19 08/01/2017 10:58 AM  SpO2 97 % 08/01/2017 10:58 AM    Last Pain:  Vitals:   08/01/17 1056  TempSrc:   PainSc: Asleep      Patients Stated Pain Goal: 0 (03/88/82 8003)  Complications: No apparent anesthesia complications

## 2017-08-02 ENCOUNTER — Telehealth: Payer: Self-pay | Admitting: Gastroenterology

## 2017-08-02 ENCOUNTER — Encounter: Payer: Self-pay | Admitting: Gastroenterology

## 2017-08-02 NOTE — Telephone Encounter (Signed)
Left vm for pt to call office and schedule 4 week Fu

## 2017-08-04 ENCOUNTER — Telehealth: Payer: Self-pay | Admitting: Gastroenterology

## 2017-08-04 LAB — SURGICAL PATHOLOGY

## 2017-08-04 NOTE — Telephone Encounter (Signed)
Pt was told by Dr. Marius Ditch that she was supposed to get rx antibiotics send to pharamacy [facility]

## 2017-08-06 ENCOUNTER — Telehealth: Payer: Self-pay

## 2017-08-06 NOTE — Telephone Encounter (Signed)
EMMI Follow-up: Sheri Calderon called me as she had just received a call from my number.  I explained our process of 2 automated calls post discharge and asked how she was doing.  She still wasn't feel very well and getting ready to call her doctor's office.  Asked if there was anything I could help her with and she replied, no. But thanked me for asking. I  let her know there would be another automated call and if she had any concerns at that time to please let us know.

## 2017-08-06 NOTE — Telephone Encounter (Signed)
Patient contacted office, had colonoscopy on 08/01/17 stated that you planned on prescribing an antibiotic.  This antibiotic has not been sent to the pharmacy.  She said she is experiencing a lot of pain.  Please advise.  Thanks [employer]

## 2017-08-06 NOTE — Telephone Encounter (Signed)
Pt left vm she states she called earlier this week AND is in a lot off pain please call pt

## 2017-08-09 ENCOUNTER — Telehealth: Payer: Self-pay | Admitting: Gastroenterology

## 2017-08-09 NOTE — Telephone Encounter (Signed)
Pt left vm she states she was in the hospital 5/31 and was supposed to receive rx antibiotic and has left several message please call pt

## 2017-09-15 ENCOUNTER — Other Ambulatory Visit: Payer: Self-pay

## 2017-09-15 ENCOUNTER — Encounter: Payer: Self-pay | Admitting: Gastroenterology

## 2017-09-15 ENCOUNTER — Ambulatory Visit: Payer: Medicare Other | Admitting: Gastroenterology

## 2017-11-25 ENCOUNTER — Other Ambulatory Visit: Payer: Self-pay

## 2017-11-25 ENCOUNTER — Encounter: Payer: Self-pay | Admitting: *Deleted

## 2017-11-25 DIAGNOSIS — Z85038 Personal history of other malignant neoplasm of large intestine: Secondary | ICD-10-CM | POA: Diagnosis not present

## 2017-11-25 DIAGNOSIS — F17210 Nicotine dependence, cigarettes, uncomplicated: Secondary | ICD-10-CM | POA: Diagnosis not present

## 2017-11-25 DIAGNOSIS — I10 Essential (primary) hypertension: Secondary | ICD-10-CM | POA: Insufficient documentation

## 2017-11-25 DIAGNOSIS — Z79899 Other long term (current) drug therapy: Secondary | ICD-10-CM | POA: Diagnosis not present

## 2017-11-25 DIAGNOSIS — G43809 Other migraine, not intractable, without status migrainosus: Secondary | ICD-10-CM | POA: Diagnosis not present

## 2017-11-25 DIAGNOSIS — R51 Headache: Secondary | ICD-10-CM | POA: Diagnosis present

## 2017-11-25 NOTE — ED Triage Notes (Signed)
Pt has a headache for 2 today.  Pt taking elavil without relief.  Pt saw her doctor today and given another rx.  Pt crying in triage.  Pt alert speech clear.  Pt ambulates without diff.

## 2017-11-26 ENCOUNTER — Emergency Department
Admission: EM | Admit: 2017-11-26 | Discharge: 2017-11-26 | Disposition: A | Discharge status: Home / Self Care | Payer: Commercial Managed Care - PPO | Attending: Emergency Medicine | Admitting: Emergency Medicine

## 2017-11-26 ENCOUNTER — Emergency Department: Payer: Commercial Managed Care - PPO

## 2017-11-26 DIAGNOSIS — G43809 Other migraine, not intractable, without status migrainosus: Secondary | ICD-10-CM

## 2017-11-26 MED ORDER — KETOROLAC TROMETHAMINE 30 MG/ML IJ SOLN
30.0000 mg | Freq: Once | INTRAMUSCULAR | Status: AC
Start: 2017-11-26 — End: 2017-11-26
  Administered 2017-11-26: 30 mg via INTRAVENOUS
  Filled 2017-11-26: qty 1

## 2017-11-26 MED ORDER — SODIUM CHLORIDE 0.9 % IV BOLUS
1000.0000 mL | Freq: Once | INTRAVENOUS | Status: AC
  Administered 2017-11-26: 1000 mL via INTRAVENOUS

## 2017-11-26 MED ORDER — SUMATRIPTAN SUCCINATE 6 MG/0.5ML ~~LOC~~ SOLN
6.0000 mg | Freq: Once | SUBCUTANEOUS | Status: AC
  Administered 2017-11-26: 6 mg via SUBCUTANEOUS
  Filled 2017-11-26: qty 0.5

## 2017-11-26 MED ORDER — PROCHLORPERAZINE EDISYLATE 10 MG/2ML IJ SOLN
10.0000 mg | Freq: Once | INTRAMUSCULAR | Status: AC
  Administered 2017-11-26: 10 mg via INTRAVENOUS
  Filled 2017-11-26: qty 2

## 2017-11-26 NOTE — ED Provider Notes (Signed)
[facility] Emergency Department Provider Note   I have reviewed the triage vital signs and the nursing notes.   HISTORY  Chief Complaint Migraine    HPI Sheri Calderon is a 52 y.o. female with below list of chronic medical conditions including migraine headaches for which the patient examined amitriptyline and Topamax presents to the emergency department with a 3-day history of "migraine headache" is currently 10 out of 10.  Patient states that the pain is predominantly occipital.  Patient admits to nausea, vomiting, photophobia phonophobia.  Patient denies any fever.  Patient denies any new weakness numbness gait instability.   Past Medical History:  Diagnosis Date  . Allergic rhinitis   . Anxiety   . Ascites    acute  . Atrial fibrillation ([place])    resolved - does not see cardiologist  . Colon cancer [facility]) 2014   cancer of the sigmoid colon  . Depression   . Diverticulitis    "this is my 2nd time in hospital w/this in the last 2 wk" (06/21/2012)  . GERD (gastroesophageal reflux disease)   . Gout   . Hydronephrosis   . Hypertension   . Lower extremity edema   . Migraines   . Osteoarthritis   . Peripheral neuropathy   . PTSD (post-traumatic stress disorder)   . Restless legs syndrome   . S/P chemotherapy, time since less than 4 weeks   . Tremor, essential     Patient Active Problem List   Diagnosis Date Noted  . Ileitis 07/30/2017  . Generalized abdominal pain   . Genetic testing 02/14/2016  . Personal history of sigmoid colon cancer 10/29/2014  . Weight gain 10/29/2014  . Cancer of sigmoid colon ([place]) 08/04/2012  . Restless legs syndrome     Past Surgical History:  Procedure Laterality Date  . APPENDECTOMY    . COLONOSCOPY N/A 08/01/2017   Procedure: COLONOSCOPY;  Surgeon: Lin Landsman, MD;  Location: [facility] ENDOSCOPY;  Service: Gastroenterology;  Laterality: N/A;  . PARTIAL COLECTOMY N/A 06/28/2012   Procedure: PARTIAL  COLECTOMY;  Surgeon: Harl Bowie, MD;  Location: [place];  Service: General;  Laterality: N/A;  . PORTACATH PLACEMENT N/A 07/11/2012   Procedure: INSERTION PORT-A-CATH;  Surgeon: Harl Bowie, MD;  Location: [facility];  Service: General;  Laterality: N/A;  . portacath removal    . TUBAL LIGATION  ~ 1989    Prior to Admission medications   Medication Sig Start Date End Date Taking? Authorizing Provider  acetaminophen (TYLENOL) 325 MG tablet Take 1 tablet (325 mg total) by mouth every 6 (six) hours as needed for mild pain or headache (or Fever >/= 101). 08/01/17   Gouru, Aruna, MD  ALPRAZolam (XANAX) 0.5 MG tablet Take 0.5 mg by mouth 3 (three) times daily as needed for anxiety. 07/18/17   [provider]  busPIRone (BUSPAR) 10 MG tablet Take 1 tablet (10 mg total) by mouth every evening. 08/01/17   Gouru, Illene Silver, MD  CHANTIX STARTING MONTH PAK 0.5 MG X 11 & 1 MG X 42 tablet See admin instructions. see package 08/23/17   [provider]  docusate sodium (COLACE) 100 MG capsule Take 1 capsule (100 mg total) by mouth 2 (two) times daily as needed for mild constipation. 08/01/17   Gouru, Illene Silver, MD  DULoxetine (CYMBALTA) 20 MG capsule Take by mouth.    [provider]  DULoxetine (CYMBALTA) 30 MG capsule Take 60 mg by mouth daily.  [provider]  HYDROcodone-acetaminophen (NORCO) 10-325 MG tablet Take 1 tablet by mouth every 4 (four) hours as needed. for pain 08/23/17   [provider]  HYDROcodone-acetaminophen (NORCO/VICODIN) 5-325 MG tablet Take 1-2 tablets by mouth every 6 (six) hours as needed for moderate pain or severe pain. 08/01/17   Gouru, Illene Silver, MD  lisinopril (PRINIVIL,ZESTRIL) 10 MG tablet Take 10 mg by mouth daily.  12/28/15   [provider]  meloxicam (MOBIC) 15 MG tablet take 1 tablet by oral route  every day for 2 weeks then as needed. 08/22/15   [provider]  nabumetone (RELAFEN) 500 MG tablet Take 500 mg by mouth 2  (two) times daily. 06/18/17   [provider]  nicotine (NICODERM CQ - DOSED IN MG/24 HOURS) 21 mg/24hr patch Place 1 patch (21 mg total) onto the skin daily. 08/02/17   Gouru, Illene Silver, MD  omeprazole (PRILOSEC) 20 MG capsule Take 20 mg by mouth daily.    [provider]  ondansetron (ZOFRAN) 4 MG tablet Take 4 mg by mouth every 6 (six) hours as needed for nausea or vomiting.     [provider]  oxyCODONE-acetaminophen (PERCOCET/ROXICET) 5-325 MG tablet Take by mouth.    [provider]  rizatriptan (MAXALT) 10 MG tablet Take 10 mg by mouth See admin instructions. Take 10 mg by mouth at onset of headache. Repeat in 2 hours if needed. Maximum 2 tablets in 24 hours. 06/10/17   [provider]  spironolactone (ALDACTONE) 25 MG tablet Take by mouth.    [provider]  spironolactone (ALDACTONE) 50 MG tablet Take 1 tablet (50 mg total) by mouth 2 (two) times daily. 08/01/17   Gouru, Illene Silver, MD  topiramate (TOPAMAX) 50 MG tablet TAKE 1 TABLET BY MOUTH EVERYDAY AT BEDTIME 06/10/17   [provider]  traZODone (DESYREL) 150 MG tablet Take 150-300 mg by mouth at bedtime. Take 150 mg by mouth at bedtime. May repeat 1 dose if ineffective. 07/15/17   [provider]    Allergies Lorazepam  Family History  Problem Relation Age of Onset  . Diabetes Mother   . Heart disease Mother   . Stomach cancer Mother   . Ovarian cancer Mother 82  . COPD Father   . Hypertension Father   . Hyperlipidemia Father   . Colon polyps Father        53 lifetime colon polyps  . Down syndrome Paternal Aunt   . Cancer Paternal Uncle        cancer in the spine  . COPD Maternal Grandmother   . Stomach cancer Paternal Grandmother     Social History Social History   Tobacco Use  . Smoking status: Current Every Day Smoker    Packs/day: 0.20    Years: 15.00    Pack years: 3.00    Types: Cigarettes    Start date: 01/17/1991  . Smokeless tobacco: Never Used    . Tobacco comment: QUIT 2 YEARS AGO  Substance Use Topics  . Alcohol use: No    Alcohol/week: 0.0 standard drinks  . Drug use: No    Review of Systems Constitutional: No fever/chills Eyes: No visual changes. ENT: No sore throat. Cardiovascular: Denies chest pain. Respiratory: Denies shortness of breath. Gastrointestinal: No abdominal pain.  No nausea, no vomiting.  No diarrhea.  No constipation. Genitourinary: Negative for dysuria. Musculoskeletal: Negative for neck pain.  Negative for back pain. Integumentary: Negative for rash. Neurological: Positive for for headaches, negative for focal  weakness or numbness.  ____________________________________________   PHYSICAL EXAM:  VITAL SIGNS: ED Triage Vitals  Enc Vitals Group     BP 11/25/17 2150 137/80     Pulse Rate 11/25/17 2150 (!) 120     Resp 11/25/17 2150 20     Temp 11/25/17 2150 98.4 F (36.9 C)     Temp Source 11/25/17 2150 Oral     SpO2 11/25/17 2150 98 %     Weight 11/25/17 2151 95.3 kg (210 lb)     Height 11/25/17 2151 1.702 m (5' 7" )     Head Circumference --      Peak Flow --      Pain Score 11/25/17 2151 10     Pain Loc --      Pain Edu? --      Excl. in [place]? --     Constitutional: Alert and oriented.  Apparent discomfort  eyes: Conjunctivae are normal. PERRL. EOMI. Head: Atraumatic. Mouth/Throat: Mucous membranes are moist.  Oropharynx non-erythematous. Neck: No stridor.  No meningeal signs.  Cardiovascular: Normal rate, regular rhythm. Good peripheral circulation. Grossly normal heart sounds. Respiratory: Normal respiratory effort.  No retractions. Lungs CTAB. Gastrointestinal: Soft and nontender. No distention.  Musculoskeletal: No lower extremity tenderness nor edema. No gross deformities of extremities. Neurologic:  Normal speech and language. No gross focal neurologic deficits are appreciated.  Skin:  Skin is warm, dry and intact. No rash noted. Psychiatric: Mood and affect are normal. Speech  and behavior are normal.   RADIOLOGY I, [place], personally viewed and evaluated these images (plain radiographs) as part of my medical decision making, as well as reviewing the written report by the radiologist.  ED MD interpretation: No evidence of acute intracranial abnormality.  Official radiology report(s): Ct Head Wo Contrast  Result Date: 11/26/2017 CLINICAL DATA:  Acute headache. Worst headache of life. No reported injury. EXAM: CT HEAD WITHOUT CONTRAST TECHNIQUE: Contiguous axial images were obtained from the base of the skull through the vertex without intravenous contrast. COMPARISON:  06/01/2009 head CT. FINDINGS: Brain: No evidence of parenchymal hemorrhage or extra-axial fluid collection. No mass lesion, mass effect, or midline shift. No CT evidence of acute infarction. Cerebral volume is age appropriate. No ventriculomegaly. Vascular: No acute abnormality. Skull: No evidence of calvarial fracture. Sinuses/Orbits: Mucoperiosteal thickening in the bilateral maxillary sinuses, left greater than right. No fluid levels. Other:  The mastoid air cells are unopacified. IMPRESSION: 1.  No evidence of acute intracranial abnormality. 2. Bilateral maxillary sinusitis, chronic appearing. Electronically Signed   By: Ilona Sorrel M.D.   On: 11/26/2017 01:17     Procedures   ____________________________________________   INITIAL IMPRESSION / ASSESSMENT AND PLAN / ED COURSE  As part of my medical decision making, I reviewed the following data within the electronic MEDICAL RECORD NUMBER  52 year old female presenting with above-stated history and physical exam consistent with migraine headache.  CT scan of the head was performed to evaluate for potential other intracranial abnormality however this was negative.  Patient was given IV Toradol 30 mg, Compazine 10 mg, and 6 mg of subcu Imitrex.  ____________________________________________  FINAL CLINICAL IMPRESSION(S) / ED  DIAGNOSES  Final diagnoses:  Other migraine without status migrainosus, not intractable     MEDICATIONS GIVEN DURING THIS VISIT:  Medications - No data to display   ED Discharge Orders    None       Note:  This document was prepared using Dragon voice recognition software and may include  unintentional dictation errors.    Gregor Hams, MD 11/26/17 [phone]

## 2018-01-07 ENCOUNTER — Ambulatory Visit (HOSPITAL_COMMUNITY)
Admission: RE | Admit: 2018-01-07 | Discharge: 2018-01-07 | Disposition: A | Discharge status: Home / Self Care | Payer: Commercial Managed Care - PPO | Source: Ambulatory Visit | Attending: Adult Health | Admitting: Adult Health

## 2018-01-07 ENCOUNTER — Other Ambulatory Visit (HOSPITAL_COMMUNITY): Payer: Self-pay | Admitting: Adult Health

## 2018-01-07 DIAGNOSIS — M545 Low back pain, unspecified: Secondary | ICD-10-CM

## 2018-01-07 DIAGNOSIS — M47816 Spondylosis without myelopathy or radiculopathy, lumbar region: Secondary | ICD-10-CM | POA: Insufficient documentation

## 2018-01-18 ENCOUNTER — Other Ambulatory Visit: Payer: Self-pay | Admitting: Adult Health

## 2018-01-21 ENCOUNTER — Other Ambulatory Visit: Payer: Self-pay | Admitting: Adult Health

## 2018-01-21 DIAGNOSIS — M545 Low back pain, unspecified: Secondary | ICD-10-CM

## 2018-02-11 ENCOUNTER — Ambulatory Visit: Payer: Medicare Other

## 2018-05-13 ENCOUNTER — Other Ambulatory Visit: Payer: Self-pay

## 2018-05-13 ENCOUNTER — Inpatient Hospital Stay
Admission: EM | Admit: 2018-05-13 | Discharge: 2018-05-15 | DRG: 391 | Discharge status: Against Medical Advice | Payer: Commercial Managed Care - PPO | Attending: Internal Medicine | Admitting: Internal Medicine

## 2018-05-13 ENCOUNTER — Emergency Department: Payer: Commercial Managed Care - PPO

## 2018-05-13 DIAGNOSIS — F17210 Nicotine dependence, cigarettes, uncomplicated: Secondary | ICD-10-CM | POA: Diagnosis present

## 2018-05-13 DIAGNOSIS — Z9049 Acquired absence of other specified parts of digestive tract: Secondary | ICD-10-CM

## 2018-05-13 DIAGNOSIS — Z9221 Personal history of antineoplastic chemotherapy: Secondary | ICD-10-CM | POA: Diagnosis not present

## 2018-05-13 DIAGNOSIS — Z8349 Family history of other endocrine, nutritional and metabolic diseases: Secondary | ICD-10-CM

## 2018-05-13 DIAGNOSIS — Z8249 Family history of ischemic heart disease and other diseases of the circulatory system: Secondary | ICD-10-CM | POA: Diagnosis not present

## 2018-05-13 DIAGNOSIS — J189 Pneumonia, unspecified organism: Secondary | ICD-10-CM | POA: Diagnosis present

## 2018-05-13 DIAGNOSIS — K529 Noninfective gastroenteritis and colitis, unspecified: Secondary | ICD-10-CM | POA: Diagnosis not present

## 2018-05-13 DIAGNOSIS — Z85038 Personal history of other malignant neoplasm of large intestine: Secondary | ICD-10-CM

## 2018-05-13 DIAGNOSIS — R079 Chest pain, unspecified: Secondary | ICD-10-CM | POA: Diagnosis not present

## 2018-05-13 DIAGNOSIS — R0902 Hypoxemia: Secondary | ICD-10-CM | POA: Diagnosis present

## 2018-05-13 DIAGNOSIS — G43909 Migraine, unspecified, not intractable, without status migrainosus: Secondary | ICD-10-CM | POA: Diagnosis present

## 2018-05-13 DIAGNOSIS — Z800 Family history of malignant neoplasm of digestive organs: Secondary | ICD-10-CM | POA: Diagnosis not present

## 2018-05-13 DIAGNOSIS — I10 Essential (primary) hypertension: Secondary | ICD-10-CM | POA: Diagnosis present

## 2018-05-13 DIAGNOSIS — Z8041 Family history of malignant neoplasm of ovary: Secondary | ICD-10-CM | POA: Diagnosis not present

## 2018-05-13 DIAGNOSIS — Z825 Family history of asthma and other chronic lower respiratory diseases: Secondary | ICD-10-CM

## 2018-05-13 DIAGNOSIS — R109 Unspecified abdominal pain: Secondary | ICD-10-CM | POA: Diagnosis not present

## 2018-05-13 DIAGNOSIS — Z833 Family history of diabetes mellitus: Secondary | ICD-10-CM | POA: Diagnosis not present

## 2018-05-13 LAB — COMPREHENSIVE METABOLIC PANEL
ALBUMIN: 4.1 g/dL (ref 3.5–5.0)
ALT: 48 U/L — ABNORMAL HIGH (ref 0–44)
AST: 33 U/L (ref 15–41)
Alkaline Phosphatase: 68 U/L (ref 38–126)
Anion gap: 10 (ref 5–15)
BUN: 15 mg/dL (ref 6–20)
CHLORIDE: 101 mmol/L (ref 98–111)
CO2: 26 mmol/L (ref 22–32)
Calcium: 8.8 mg/dL — ABNORMAL LOW (ref 8.9–10.3)
Creatinine, Ser: 0.93 mg/dL (ref 0.44–1.00)
GFR calc Af Amer: >60 mL/min (ref >60)
GFR calc non Af Amer: >60 mL/min (ref >60)
Glucose, Bld: 116 mg/dL — ABNORMAL HIGH (ref 70–99)
POTASSIUM: 3.6 mmol/L (ref 3.5–5.1)
SODIUM: 137 mmol/L (ref 135–145)
Total Bilirubin: 0.6 mg/dL (ref 0.3–1.2)
Total Protein: 7.7 g/dL (ref 6.5–8.1)

## 2018-05-13 LAB — CBC
HCT: 38.6 % (ref 36.0–46.0)
Hemoglobin: 12.6 g/dL (ref 12.0–15.0)
MCH: 28.1 pg (ref 26.0–34.0)
MCHC: 32.6 g/dL (ref 30.0–36.0)
MCV: 86.2 fL (ref 80.0–100.0)
PLATELETS: 354 10*3/uL (ref 150–400)
RBC: 4.48 MIL/uL (ref 3.87–5.11)
RDW: 12.6 % (ref 11.5–15.5)
WBC: 7.5 10*3/uL (ref 4.0–10.5)
nRBC: 0 % (ref 0.0–0.2)

## 2018-05-13 LAB — LIPASE, BLOOD: LIPASE: 26 U/L (ref 11–51)

## 2018-05-13 MED ORDER — LEVOFLOXACIN IN D5W 500 MG/100ML IV SOLN
500.0000 mg | Freq: Once | INTRAVENOUS | Status: AC
  Administered 2018-05-13: 500 mg via INTRAVENOUS
  Filled 2018-05-13: qty 100

## 2018-05-13 MED ORDER — HYDROCHLOROTHIAZIDE 25 MG PO TABS: 25.0000 mg | ORAL_TABLET | Freq: Every day | ORAL | Status: DC

## 2018-05-13 MED ORDER — HYDROMORPHONE HCL 1 MG/ML IJ SOLN
INTRAMUSCULAR | Status: AC
  Administered 2018-05-13: 0.5 mg via INTRAVENOUS
  Filled 2018-05-13: qty 1

## 2018-05-13 MED ORDER — IOPAMIDOL (ISOVUE-300) INJECTION 61%
30.0000 mL | Freq: Once | INTRAVENOUS | Status: AC | PRN
  Administered 2018-05-13: 30 mL via ORAL

## 2018-05-13 MED ORDER — MORPHINE SULFATE (PF) 4 MG/ML IV SOLN
4.0000 mg | Freq: Once | INTRAVENOUS | Status: AC
  Administered 2018-05-13: 4 mg via INTRAVENOUS

## 2018-05-13 MED ORDER — ACETAMINOPHEN 325 MG PO TABS
650.0000 mg | ORAL_TABLET | Freq: Four times a day (QID) | ORAL | Status: DC | PRN
  Administered 2018-05-14: 650 mg via ORAL
  Filled 2018-05-13: qty 2

## 2018-05-13 MED ORDER — LISINOPRIL 10 MG PO TABS: 10.0000 mg | ORAL_TABLET | Freq: Every day | ORAL | Status: DC

## 2018-05-13 MED ORDER — SUMATRIPTAN SUCCINATE 50 MG PO TABS
50.0000 mg | ORAL_TABLET | ORAL | Status: DC | PRN
  Filled 2018-05-13: qty 1

## 2018-05-13 MED ORDER — MORPHINE SULFATE (PF) 4 MG/ML IV SOLN
INTRAVENOUS | Status: AC
  Filled 2018-05-13: qty 1

## 2018-05-13 MED ORDER — SODIUM CHLORIDE 0.9 % IV SOLN
INTRAVENOUS | Status: DC
  Administered 2018-05-14 (×3): via INTRAVENOUS

## 2018-05-13 MED ORDER — IPRATROPIUM-ALBUTEROL 0.5-2.5 (3) MG/3ML IN SOLN
3.0000 mL | Freq: Once | RESPIRATORY_TRACT | Status: AC
  Administered 2018-05-13: 3 mL via RESPIRATORY_TRACT
  Filled 2018-05-13: qty 3

## 2018-05-13 MED ORDER — ONDANSETRON HCL 4 MG/2ML IJ SOLN
4.0000 mg | Freq: Four times a day (QID) | INTRAMUSCULAR | Status: DC | PRN
  Administered 2018-05-14 (×2): 4 mg via INTRAVENOUS
  Filled 2018-05-13 (×3): qty 2

## 2018-05-13 MED ORDER — DULOXETINE HCL 60 MG PO CPEP
60.0000 mg | ORAL_CAPSULE | Freq: Every day | ORAL | Status: DC
  Administered 2018-05-14: 60 mg via ORAL
  Filled 2018-05-13 (×2): qty 1

## 2018-05-13 MED ORDER — PANTOPRAZOLE SODIUM 40 MG PO TBEC
40.0000 mg | DELAYED_RELEASE_TABLET | Freq: Every day | ORAL | Status: DC
  Administered 2018-05-14: 40 mg via ORAL
  Filled 2018-05-13: qty 1

## 2018-05-13 MED ORDER — ACETAMINOPHEN 650 MG RE SUPP: 650.0000 mg | Freq: Four times a day (QID) | RECTAL | Status: DC | PRN

## 2018-05-13 MED ORDER — DOCUSATE SODIUM 100 MG PO CAPS
100.0000 mg | ORAL_CAPSULE | Freq: Two times a day (BID) | ORAL | Status: DC
  Filled 2018-05-13: qty 1

## 2018-05-13 MED ORDER — ONDANSETRON HCL 4 MG/2ML IJ SOLN
4.0000 mg | Freq: Once | INTRAMUSCULAR | Status: AC
  Administered 2018-05-13: 4 mg via INTRAVENOUS
  Filled 2018-05-13: qty 2

## 2018-05-13 MED ORDER — ONDANSETRON 4 MG PO TBDP
4.0000 mg | ORAL_TABLET | Freq: Once | ORAL | Status: AC | PRN
  Administered 2018-05-13: 4 mg via ORAL
  Filled 2018-05-13: qty 1

## 2018-05-13 MED ORDER — HYDROMORPHONE HCL 1 MG/ML IJ SOLN
0.5000 mg | INTRAMUSCULAR | Status: AC
  Administered 2018-05-13: 0.5 mg via INTRAVENOUS

## 2018-05-13 MED ORDER — IOHEXOL 300 MG/ML  SOLN
100.0000 mL | Freq: Once | INTRAMUSCULAR | Status: AC | PRN
  Administered 2018-05-13: 100 mL via INTRAVENOUS

## 2018-05-13 MED ORDER — ALPRAZOLAM 0.5 MG PO TABS
0.5000 mg | ORAL_TABLET | Freq: Three times a day (TID) | ORAL | Status: DC | PRN
  Administered 2018-05-14 (×3): 0.5 mg via ORAL
  Filled 2018-05-13 (×3): qty 1

## 2018-05-13 MED ORDER — MORPHINE SULFATE (PF) 2 MG/ML IV SOLN
2.0000 mg | INTRAVENOUS | Status: DC | PRN
  Administered 2018-05-14 (×3): 2 mg via INTRAVENOUS
  Filled 2018-05-13 (×3): qty 1

## 2018-05-13 MED ORDER — MORPHINE SULFATE (PF) 4 MG/ML IV SOLN
4.0000 mg | Freq: Once | INTRAVENOUS | Status: AC
  Administered 2018-05-13: 4 mg via INTRAVENOUS
  Filled 2018-05-13: qty 1

## 2018-05-13 MED ORDER — ONDANSETRON HCL 4 MG PO TABS: 4.0000 mg | ORAL_TABLET | Freq: Four times a day (QID) | ORAL | Status: DC | PRN

## 2018-05-13 MED ORDER — OXYCODONE-ACETAMINOPHEN 5-325 MG PO TABS
1.0000 | ORAL_TABLET | ORAL | Status: DC | PRN
  Administered 2018-05-13: 1 via ORAL
  Filled 2018-05-13: qty 1

## 2018-05-13 MED ORDER — ENOXAPARIN SODIUM 40 MG/0.4ML ~~LOC~~ SOLN
40.0000 mg | SUBCUTANEOUS | Status: DC
  Administered 2018-05-14 – 2018-05-15 (×2): 40 mg via SUBCUTANEOUS
  Filled 2018-05-13 (×2): qty 0.4

## 2018-05-13 MED ORDER — HYDROMORPHONE HCL 1 MG/ML IJ SOLN: 0.5000 mg | INTRAMUSCULAR | Status: DC | PRN

## 2018-05-13 NOTE — ED Notes (Signed)
This RN got pt up to walk down the hallway. Oxygen sats dropped down to the 88-89% mark with pt feeling slightly SOB.

## 2018-05-13 NOTE — ED Notes (Signed)
FIRST NURSE NOTE: Pt presents to ED hunched over and tearful. Pt placed in a wheelchair but obviously remains uncomfortable at this time. PT reporting nasuea as well.

## 2018-05-13 NOTE — ED Provider Notes (Addendum)
[facility] Emergency Department Provider Note   ____________________________________________   First MD Initiated Contact with Patient 05/13/18 1750     (approximate)  I have reviewed the triage vital signs and the nursing notes.   HISTORY  Chief Complaint Abdominal Pain    HPI Sheri Calderon is a 53 y.o. female who complains of several days of abdominal pain and bloating when she coughs and low back pain as well.'s been going on for about a week for the belly pain and may be 2 months for the back pain.  Both pains are achy in nature.  Moderate to severe at times.  She reports some soft stools.  She has a history of colon cancer.  She had surgery for about 5 years ago she reports.  She is currently complaining of nausea.         Past Medical History:  Diagnosis Date  . Allergic rhinitis   . Anxiety   . Ascites    acute  . Atrial fibrillation ([place])    resolved - does not see cardiologist  . Colon cancer [facility]) 2014   cancer of the sigmoid colon  . Depression   . Diverticulitis    "this is my 2nd time in hospital w/this in the last 2 wk" (06/21/2012)  . GERD (gastroesophageal reflux disease)   . Gout   . Hydronephrosis   . Hypertension   . Lower extremity edema   . Migraines   . Osteoarthritis   . Peripheral neuropathy   . PTSD (post-traumatic stress disorder)   . Restless legs syndrome   . S/P chemotherapy, time since less than 4 weeks   . Tremor, essential     Patient Active Problem List   Diagnosis Date Noted  . Ileitis 07/30/2017  . Generalized abdominal pain   . Genetic testing 02/14/2016  . Personal history of sigmoid colon cancer 10/29/2014  . Weight gain 10/29/2014  . Cancer of sigmoid colon ([place]) 08/04/2012  . Restless legs syndrome     Past Surgical History:  Procedure Laterality Date  . APPENDECTOMY    . COLONOSCOPY N/A 08/01/2017   Procedure: COLONOSCOPY;  Surgeon: Lin Landsman, MD;  Location: [facility] ENDOSCOPY;   Service: Gastroenterology;  Laterality: N/A;  . PARTIAL COLECTOMY N/A 06/28/2012   Procedure: PARTIAL COLECTOMY;  Surgeon: Harl Bowie, MD;  Location: [place];  Service: General;  Laterality: N/A;  . PORTACATH PLACEMENT N/A 07/11/2012   Procedure: INSERTION PORT-A-CATH;  Surgeon: Harl Bowie, MD;  Location: [facility];  Service: General;  Laterality: N/A;  . portacath removal    . TUBAL LIGATION  ~ 1989    Prior to Admission medications   Medication Sig Start Date End Date Taking? Authorizing Provider  acetaminophen (TYLENOL) 325 MG tablet Take 1 tablet (325 mg total) by mouth every 6 (six) hours as needed for mild pain or headache (or Fever >/= 101). 08/01/17  Yes Gouru, Aruna, MD  ALPRAZolam (XANAX) 0.5 MG tablet Take 0.5 mg by mouth 3 (three) times daily as needed for anxiety. 07/18/17  Yes [provider]  DULoxetine (CYMBALTA) 60 MG capsule Take 60 mg by mouth daily.    Yes [provider]  hydrochlorothiazide (HYDRODIURIL) 25 MG tablet Take 25 mg by mouth daily. 04/22/18  Yes [provider]  lisinopril (PRINIVIL,ZESTRIL) 10 MG tablet Take 10 mg by mouth daily.  12/28/15  Yes [provider]  omeprazole (PRILOSEC) 20 MG capsule Take 20 mg by mouth daily.  Yes [provider]  rizatriptan (MAXALT) 10 MG tablet Take 10 mg by mouth See admin instructions. Take 10 mg by mouth at onset of headache. Repeat in 2 hours if needed. Maximum 2 tablets in 24 hours. 06/10/17  Yes [provider]    Allergies Lorazepam  Family History  Problem Relation Age of Onset  . Diabetes Mother   . Heart disease Mother   . Stomach cancer Mother   . Ovarian cancer Mother 25  . COPD Father   . Hypertension Father   . Hyperlipidemia Father   . Colon polyps Father        88 lifetime colon polyps  . Down syndrome Paternal Aunt   . Cancer Paternal Uncle        cancer in the spine  . COPD Maternal Grandmother   . Stomach cancer Paternal Grandmother      Social History Social History   Tobacco Use  . Smoking status: Current Every Day Smoker    Packs/day: 0.20    Years: 15.00    Pack years: 3.00    Types: Cigarettes    Start date: 01/17/1991  . Smokeless tobacco: Never Used  . Tobacco comment: QUIT 2 YEARS AGO  Substance Use Topics  . Alcohol use: No    Alcohol/week: 0.0 standard drinks  . Drug use: No    Review of Systems  Constitutional: No fever/chills Eyes: No visual changes. ENT: No sore throat. Cardiovascular: Denies chest pain. Respiratory: Denies shortness of breath. Gastrointestinal: See HPI Genitourinary: Negative for dysuria. Musculoskeletal: Low back pain. Skin: Negative for rash. Neurological: Negative for headaches, focal weakness  ____________________________________________   PHYSICAL EXAM:  VITAL SIGNS: ED Triage Vitals [05/13/18 1633]  Enc Vitals Group     BP 118/77     Pulse Rate (!) 118     Resp 20     Temp 98.3 F (36.8 C)     Temp Source Oral     SpO2 97 %     Weight 230 lb (104.3 kg)     Height 5' 7"  (1.702 m)     Head Circumference      Peak Flow      Pain Score 10     Pain Loc      Pain Edu?      Excl. in [place]?     Constitutional: Alert and oriented. Well appearing and in no acute distress. Eyes: Conjunctivae are normal. Head: Atraumatic. Nose: No congestion/rhinnorhea. Mouth/Throat: Mucous membranes are moist.  Oropharynx non-erythematous. Neck: No stridor.  Cardiovascular: Normal rate, regular rhythm. Grossly normal heart sounds.  Good peripheral circulation. Respiratory: Normal respiratory effort.  No retractions. Lungs slightly tight with some wheezing Gastrointestinal: Soft mildly tender over what appears to be a midline hernia.  Some mild distention. No abdominal bruits. No CVA tenderness. Musculoskeletal: No lower extremity tenderness nor edema.  Neurologic:  Normal speech and language. No gross focal neurologic deficits are appreciated.  Skin:  Skin is warm, dry and  intact   ____________________________________________   LABS (all labs ordered are listed, but only abnormal results are displayed)  Labs Reviewed  COMPREHENSIVE METABOLIC PANEL - Abnormal; Notable for the following components:      Result Value   Glucose, Bld 116 (*)    Calcium 8.8 (*)    ALT 48 (*)    All other components within normal limits  LIPASE, BLOOD  CBC  URINALYSIS, COMPLETE (UACMP) WITH MICROSCOPIC  POC URINE PREG, ED   ____________________________________________  EKG  _________________________________________  RADIOLOGY  ED MD interpretation: CT read by radiology reviewed by me shows a pneumonia and some findings consistent with possible enteritis.  Official radiology report(s): Ct Abdomen Pelvis W Contrast  Result Date: 05/13/2018 CLINICAL DATA:  Central abdominal pain at the umbilicus for 1 week. Back pain for 2 months. Vomiting. History of colon cancer in remission. EXAM: CT ABDOMEN AND PELVIS WITH CONTRAST TECHNIQUE: Multidetector CT imaging of the abdomen and pelvis was performed using the standard protocol following bolus administration of intravenous contrast. CONTRAST:  137m OMNIPAQUE IOHEXOL 300 MG/ML  SOLN COMPARISON:  07/29/2017 FINDINGS: Lower chest: Focal area of airspace consolidation in the left lingula may represent pneumonia. Atelectasis in the lung bases. Hepatobiliary: Diffuse fatty infiltration of the liver. No focal liver lesions. Gallbladder and bile ducts are unremarkable. Pancreas: Unremarkable. No pancreatic ductal dilatation or surrounding inflammatory changes. Spleen: Normal in size without focal abnormality. Adrenals/Urinary Tract: No adrenal gland nodules. Bilateral parapelvic renal cysts. Nephrograms are symmetrical. No hydronephrosis or hydroureter. Bladder is unremarkable. Stomach/Bowel: Surgical anastomosis in the sigmoid colon. Scattered stool throughout the colon. Stomach, small bowel, and colon are not abnormally distended.  Decompression of small bowel limits evaluation but there appears to be mild wall thickening in the terminal ileum with some interloop fluid which may indicate enteritis. This appearance is similar to that seen on previous study. Appendix is surgically absent. Vascular/Lymphatic: No significant vascular findings are present. No enlarged abdominal or pelvic lymph nodes. Minimal aortic calcification. Reproductive: Uterus and bilateral adnexa are unremarkable. Other: No free air or free fluid in the abdomen. Small periumbilical hernia containing fat. Musculoskeletal: No acute or significant osseous findings. IMPRESSION: 1. Focal area of airspace consolidation in the left lingula may represent pneumonia. 2. Mild wall thickening in the terminal ileum with interloop fluid may indicate enteritis. No evidence of bowel obstruction. 3. Diffuse fatty infiltration of the liver. 4. Bilateral parapelvic renal cysts. Aortic Atherosclerosis (ICD10-I70.0). Electronically Signed   By: WLucienne CapersM.D.   On: 05/13/2018 19:40    ____________________________________________   PROCEDURES  Procedure(s) performed (including Critical Care):  Procedures   ____________________________________________   INITIAL IMPRESSION / ASSESSMENT AND PLAN / ED COURSE   Patient desats to 88 on walking.  We will get her in the hospital get some antibiotics started.  See if we can get her better.  We will follow the enteritis appearing CT make sure that improves.             ____________________________________________   FINAL CLINICAL IMPRESSION(S) / ED DIAGNOSES  Final diagnoses:  Abdominal pain, unspecified abdominal location  Community acquired pneumonia, unspecified laterality  Hypoxia     ED Discharge Orders    None       Note:  This document was prepared using Dragon voice recognition software and may include unintentional dictation errors.    MNena Polio MD 05/13/18 2134    MNena Polio MD 05/13/18 2137

## 2018-05-13 NOTE — ED Triage Notes (Signed)
Pt states central abd pain at belly button x 1 week. States back pain x 2 months. No appendix. States vomiting. States not diarrhea but "not normal" per pt. A&O. In wheelchair. Hx colon CA, not being treated at this time, in remission.

## 2018-05-13 NOTE — ED Notes (Addendum)
ED TO INPATIENT HANDOFF REPORT  ED Nurse Name and Phone #: Karena Addison [phone]  S Name/Age/Gender Sheri Calderon 53 y.o. female Room/Bed: ED01A/ED01A  Code Status   Code Status: Prior  Home/SNF/Other Home Patient oriented to: self, place, time and situation Is this baseline? Yes   Triage Complete: Triage complete  Chief Complaint Abd Pain  Triage Note Pt states central abd pain at belly button x 1 week. States back pain x 2 months. No appendix. States vomiting. States not diarrhea but "not normal" per pt. A&O. In wheelchair. Hx colon CA, not being treated at this time, in remission.    Allergies Allergies  Allergen Reactions  . Lorazepam Hives and Swelling    Patient reports receiving lorazepam intensol in the hospital and experienced swelling with hives.    Level of Care/Admitting Diagnosis ED Disposition    ED Disposition Condition [facility] Area: [place] [[id number]]  Level of Care: Med-Surg [16]  Diagnosis: CAP (community acquired pneumonia) [[id number]]  Admitting Physician: Harrie Foreman [[id number]]  Attending Physician: Harrie Foreman [[id number]]  Estimated length of stay: past midnight tomorrow  Certification:: I certify this patient will need inpatient services for at least 2 midnights  PT Class (Do Not Modify): Inpatient [101]  PT Acc Code (Do Not Modify): Private [1]       B Medical/Surgery History Past Medical History:  Diagnosis Date  . Allergic rhinitis   . Anxiety   . Ascites    acute  . Atrial fibrillation ([place])    resolved - does not see cardiologist  . Colon cancer [facility]) 2014   cancer of the sigmoid colon  . Depression   . Diverticulitis    "this is my 2nd time in hospital w/this in the last 2 wk" (06/21/2012)  . GERD (gastroesophageal reflux disease)   . Gout   . Hydronephrosis   . Hypertension   . Lower extremity edema   . Migraines   . Osteoarthritis   . Peripheral neuropathy   . PTSD (post-traumatic  stress disorder)   . Restless legs syndrome   . S/P chemotherapy, time since less than 4 weeks   . Tremor, essential    Past Surgical History:  Procedure Laterality Date  . APPENDECTOMY    . COLONOSCOPY N/A 08/01/2017   Procedure: COLONOSCOPY;  Surgeon: Lin Landsman, MD;  Location: [facility] ENDOSCOPY;  Service: Gastroenterology;  Laterality: N/A;  . PARTIAL COLECTOMY N/A 06/28/2012   Procedure: PARTIAL COLECTOMY;  Surgeon: Harl Bowie, MD;  Location: [facility];  Service: General;  Laterality: N/A;  . PORTACATH PLACEMENT N/A 07/11/2012   Procedure: INSERTION PORT-A-CATH;  Surgeon: Harl Bowie, MD;  Location: [facility];  Service: General;  Laterality: N/A;  . portacath removal    . TUBAL LIGATION  ~ 1989     A IV Location/Drains/Wounds Patient Lines/Drains/Airways Status   Active Line/Drains/Airways    Name:   Placement date:   Placement time:   Site:   Days:   Peripheral IV 05/13/18 Left Antecubital   05/13/18    1822    Antecubital   less than 1          Intake/Output Last 24 hours No intake or output data in the 24 hours ending 05/13/18 2252  Labs/Imaging Results for orders placed or performed during the hospital encounter of 05/13/18 (from the past 48 hour(s))  Lipase, blood     Status: None   Collection Time: 05/13/18  4:35  PM  Result Value Ref Range   Lipase 26 11 - 51 U/L    Comment: Performed at [facility], [address] [place], [place] [address]  Comprehensive metabolic panel     Status: Abnormal   Collection Time: 05/13/18  4:35 PM  Result Value Ref Range   Sodium 137 135 - 145 mmol/L   Potassium 3.6 3.5 - 5.1 mmol/L   Chloride 101 98 - 111 mmol/L   CO2 26 22 - 32 mmol/L   Glucose, Bld 116 (H) 70 - 99 mg/dL   BUN 15 6 - 20 mg/dL   Creatinine, Ser 0.93 0.44 - 1.00 mg/dL   Calcium 8.8 (L) 8.9 - 10.3 mg/dL   Total Protein 7.7 6.5 - 8.1 g/dL   Albumin 4.1 3.5 - 5.0 g/dL   AST 33 15 - 41 U/L   ALT 48 (H) 0 - 44 U/L   Alkaline Phosphatase  68 38 - 126 U/L   Total Bilirubin 0.6 0.3 - 1.2 mg/dL   GFR calc non Af Amer >60 >60 mL/min   GFR calc Af Amer >60 >60 mL/min   Anion gap 10 5 - 15    Comment: Performed at [facility], [address] [place],  [address]  CBC     Status: None   Collection Time: 05/13/18  4:35 PM  Result Value Ref Range   WBC 7.5 4.0 - 10.5 K/uL   RBC 4.48 3.87 - 5.11 MIL/uL   Hemoglobin 12.6 12.0 - 15.0 g/dL   HCT 38.6 36.0 - 46.0 %   MCV 86.2 80.0 - 100.0 fL   MCH 28.1 26.0 - 34.0 pg   MCHC 32.6 30.0 - 36.0 g/dL   RDW 12.6 11.5 - 15.5 %   Platelets 354 150 - 400 K/uL   nRBC 0.0 0.0 - 0.2 %    Comment: Performed at [facility], [address] [place],  [address]   Ct Abdomen Pelvis W Contrast  Result Date: 05/13/2018 CLINICAL DATA:  Central abdominal pain at the umbilicus for 1 week. Back pain for 2 months. Vomiting. History of colon cancer in remission. EXAM: CT ABDOMEN AND PELVIS WITH CONTRAST TECHNIQUE: Multidetector CT imaging of the abdomen and pelvis was performed using the standard protocol following bolus administration of intravenous contrast. CONTRAST:  115m OMNIPAQUE IOHEXOL 300 MG/ML  SOLN COMPARISON:  07/29/2017 FINDINGS: Lower chest: Focal area of airspace consolidation in the left lingula may represent pneumonia. Atelectasis in the lung bases. Hepatobiliary: Diffuse fatty infiltration of the liver. No focal liver lesions. Gallbladder and bile ducts are unremarkable. Pancreas: Unremarkable. No pancreatic ductal dilatation or surrounding inflammatory changes. Spleen: Normal in size without focal abnormality. Adrenals/Urinary Tract: No adrenal gland nodules. Bilateral parapelvic renal cysts. Nephrograms are symmetrical. No hydronephrosis or hydroureter. Bladder is unremarkable. Stomach/Bowel: Surgical anastomosis in the sigmoid colon. Scattered stool throughout the colon. Stomach, small bowel, and colon are not abnormally distended. Decompression of small  bowel limits evaluation but there appears to be mild wall thickening in the terminal ileum with some interloop fluid which may indicate enteritis. This appearance is similar to that seen on previous study. Appendix is surgically absent. Vascular/Lymphatic: No significant vascular findings are present. No enlarged abdominal or pelvic lymph nodes. Minimal aortic calcification. Reproductive: Uterus and bilateral adnexa are unremarkable. Other: No free air or free fluid in the abdomen. Small periumbilical hernia containing fat. Musculoskeletal: No acute or significant osseous findings. IMPRESSION: 1. Focal area of airspace consolidation in the  left lingula may represent pneumonia. 2. Mild wall thickening in the terminal ileum with interloop fluid may indicate enteritis. No evidence of bowel obstruction. 3. Diffuse fatty infiltration of the liver. 4. Bilateral parapelvic renal cysts. Aortic Atherosclerosis (ICD10-I70.0). Electronically Signed   By: Lucienne Capers M.D.   On: 05/13/2018 19:40    Pending Labs Unresulted Labs (From admission, onward)    Start     Ordered   05/13/18 2135  Culture, blood (routine x 2)  BLOOD CULTURE X 2,   STAT     05/13/18 2134   05/13/18 1635  Urinalysis, Complete w Microscopic  ONCE - STAT,   STAT     05/13/18 1634   Signed and Held  Creatinine, serum  (enoxaparin (LOVENOX)    CrCl >/= 30 ml/min)  Weekly,   R    Comments:  while on enoxaparin therapy    Signed and Held   Signed and Held  TSH  Add-on,   R     Signed and Held   Signed and Held  Hemoglobin A1c  Add-on,   R     Signed and Held          Vitals/Pain Today's Vitals   05/13/18 1900 05/13/18 2203 05/13/18 2213 05/13/18 2230  BP: (!) 142/100  112/86 (!) 124/95  Pulse: 92  92 88  Resp:      Temp:      TempSrc:      SpO2: 92%  95% 93%  Weight:      Height:      PainSc:  10-Worst pain ever      Isolation Precautions No active isolations  Medications Medications  oxyCODONE-acetaminophen  (PERCOCET/ROXICET) 5-325 MG per tablet 1 tablet (1 tablet Oral Given 05/13/18 1638)  levofloxacin (LEVAQUIN) IVPB 500 mg (500 mg Intravenous New Bag/Given 05/13/18 2212)  ondansetron (ZOFRAN-ODT) disintegrating tablet 4 mg (4 mg Oral Given 05/13/18 1638)  ipratropium-albuterol (DUONEB) 0.5-2.5 (3) MG/3ML nebulizer solution 3 mL (3 mLs Nebulization Given 05/13/18 1826)  ondansetron (ZOFRAN) injection 4 mg (4 mg Intravenous Given 05/13/18 1827)  morphine 4 MG/ML injection 4 mg (4 mg Intravenous Given 05/13/18 1827)  iopamidol (ISOVUE-300) 61 % injection 30 mL (30 mLs Oral Contrast Given 05/13/18 1831)  iohexol (OMNIPAQUE) 300 MG/ML solution 100 mL (100 mLs Intravenous Contrast Given 05/13/18 1924)  morphine 4 MG/ML injection 4 mg (4 mg Intravenous Given 05/13/18 1947)  HYDROmorphone (DILAUDID) injection 0.5 mg (0.5 mg Intravenous Given 05/13/18 2212)    Mobility walks Low fall risk   Focused Assessments    R Recommendations: See Admitting Provider Note  Report given to: Raquel Sarna, RN

## 2018-05-14 ENCOUNTER — Other Ambulatory Visit: Payer: Self-pay

## 2018-05-14 DIAGNOSIS — R109 Unspecified abdominal pain: Secondary | ICD-10-CM

## 2018-05-14 DIAGNOSIS — R079 Chest pain, unspecified: Secondary | ICD-10-CM

## 2018-05-14 DIAGNOSIS — K529 Noninfective gastroenteritis and colitis, unspecified: Principal | ICD-10-CM

## 2018-05-14 LAB — TROPONIN I
Troponin I: <0.03 ng/mL (ref <0.03)
Troponin I: <0.03 ng/mL (ref <0.03)
Troponin I: <0.03 ng/mL (ref <0.03)

## 2018-05-14 LAB — HEMOGLOBIN A1C
Hgb A1c MFr Bld: 5.7 % — ABNORMAL HIGH (ref 4.8–5.6)
Mean Plasma Glucose: 116.89 mg/dL

## 2018-05-14 LAB — TSH: TSH: 3.901 u[IU]/mL (ref 0.350–4.500)

## 2018-05-14 MED ORDER — MORPHINE SULFATE (PF) 4 MG/ML IV SOLN
INTRAVENOUS | Status: AC
  Administered 2018-05-14: 4 mg
  Filled 2018-05-14: qty 1

## 2018-05-14 MED ORDER — NITROGLYCERIN 0.4 MG SL SUBL
0.4000 mg | SUBLINGUAL_TABLET | SUBLINGUAL | Status: DC | PRN
  Administered 2018-05-14: 0.4 mg via SUBLINGUAL
  Filled 2018-05-14 (×2): qty 1

## 2018-05-14 MED ORDER — SUMATRIPTAN SUCCINATE 50 MG PO TABS
50.0000 mg | ORAL_TABLET | Freq: Three times a day (TID) | ORAL | Status: DC | PRN
  Administered 2018-05-14: 50 mg via ORAL
  Filled 2018-05-14 (×2): qty 1

## 2018-05-14 MED ORDER — METRONIDAZOLE IN NACL 5-0.79 MG/ML-% IV SOLN
500.0000 mg | Freq: Three times a day (TID) | INTRAVENOUS | Status: DC
  Administered 2018-05-14: 500 mg via INTRAVENOUS
  Filled 2018-05-14 (×3): qty 100

## 2018-05-14 MED ORDER — ALBUTEROL SULFATE (2.5 MG/3ML) 0.083% IN NEBU
2.5000 mg | INHALATION_SOLUTION | RESPIRATORY_TRACT | Status: DC | PRN
  Administered 2018-05-14: 2.5 mg via RESPIRATORY_TRACT
  Filled 2018-05-14: qty 3

## 2018-05-14 MED ORDER — SODIUM CHLORIDE 0.9 % IV SOLN
3.0000 g | Freq: Four times a day (QID) | INTRAVENOUS | Status: DC
  Administered 2018-05-14 (×3): 3 g via INTRAVENOUS
  Filled 2018-05-14 (×5): qty 3

## 2018-05-14 MED ORDER — SODIUM CHLORIDE 0.9 % IV SOLN
500.0000 mg | INTRAVENOUS | Status: DC
  Filled 2018-05-14: qty 500

## 2018-05-14 MED ORDER — MORPHINE SULFATE (PF) 4 MG/ML IV SOLN: 4.0000 mg | Freq: Once | INTRAVENOUS | Status: AC

## 2018-05-14 MED ORDER — NITROGLYCERIN 0.4 MG SL SUBL
SUBLINGUAL_TABLET | SUBLINGUAL | Status: AC
  Administered 2018-05-14: 0.4 mg
  Filled 2018-05-14: qty 1

## 2018-05-14 MED ORDER — SODIUM CHLORIDE 0.9 % IV SOLN
1.0000 g | INTRAVENOUS | Status: DC
  Administered 2018-05-14: 1 g via INTRAVENOUS
  Filled 2018-05-14: qty 10

## 2018-05-14 MED ORDER — MORPHINE SULFATE (PF) 2 MG/ML IV SOLN
2.0000 mg | INTRAVENOUS | Status: DC | PRN
  Administered 2018-05-14 (×2): 2 mg via INTRAVENOUS
  Filled 2018-05-14 (×2): qty 1

## 2018-05-14 NOTE — H&P (Signed)
Sheri Calderon is an 53 y.o. female.   Chief Complaint: Abdominal pain HPI: The patient with past medical history of colon cancer status post sigmoidectomy, hypertension and GERD presents to the emergency department complaining of abdominal pain.  She has had some nausea but no vomiting.  CT of her abdomen showed inflammation of her terminal ileum as well as a lingular pneumonia.  The patient was started on antibiotics prior to the emergency department staff calling the hospitalist service for admission.  Past Medical History:  Diagnosis Date  . Allergic rhinitis   . Anxiety   . Ascites    acute  . Atrial fibrillation ([place])    resolved - does not see cardiologist  . Colon cancer [facility]) 2014   cancer of the sigmoid colon  . Depression   . Diverticulitis    "this is my 2nd time in hospital w/this in the last 2 wk" (06/21/2012)  . GERD (gastroesophageal reflux disease)   . Gout   . Hydronephrosis   . Hypertension   . Lower extremity edema   . Migraines   . Osteoarthritis   . Peripheral neuropathy   . PTSD (post-traumatic stress disorder)   . Restless legs syndrome   . S/P chemotherapy, time since less than 4 weeks   . Tremor, essential     Past Surgical History:  Procedure Laterality Date  . APPENDECTOMY    . COLONOSCOPY N/A 08/01/2017   Procedure: COLONOSCOPY;  Surgeon: Lin Landsman, MD;  Location: [facility] ENDOSCOPY;  Service: Gastroenterology;  Laterality: N/A;  . PARTIAL COLECTOMY N/A 06/28/2012   Procedure: PARTIAL COLECTOMY;  Surgeon: Harl Bowie, MD;  Location: [facility];  Service: General;  Laterality: N/A;  . PORTACATH PLACEMENT N/A 07/11/2012   Procedure: INSERTION PORT-A-CATH;  Surgeon: Harl Bowie, MD;  Location[facility];  Service: General;  Laterality: N/A;  . portacath removal    . TUBAL LIGATION  ~ 1989    Family History  Problem Relation Age of Onset  . Diabetes Mother   . Heart disease Mother   . Stomach cancer Mother   . Ovarian cancer Mother 54   . COPD Father   . Hypertension Father   . Hyperlipidemia Father   . Colon polyps Father        58 lifetime colon polyps  . Down syndrome Paternal Aunt   . Cancer Paternal Uncle        cancer in the spine  . COPD Maternal Grandmother   . Stomach cancer Paternal Grandmother    Social History:  reports that she has been smoking cigarettes. She started smoking about 27 years ago. She has a 3.00 pack-year smoking history. She has never used smokeless tobacco. She reports that she does not drink alcohol or use drugs.  Allergies:  Allergies  Allergen Reactions  . Lorazepam Hives and Swelling    Patient reports receiving lorazepam intensol in the hospital and experienced swelling with hives.    Medications Prior to Admission  Medication Sig Dispense Refill  . acetaminophen (TYLENOL) 325 MG tablet Take 1 tablet (325 mg total) by mouth every 6 (six) hours as needed for mild pain or headache (or Fever >/= 101).    Marland Kitchen ALPRAZolam (XANAX) 0.5 MG tablet Take 0.5 mg by mouth 3 (three) times daily as needed for anxiety.  2  . DULoxetine (CYMBALTA) 60 MG capsule Take 60 mg by mouth daily.     . hydrochlorothiazide (HYDRODIURIL) 25 MG tablet Take 25 mg by mouth  daily.    . lisinopril (PRINIVIL,ZESTRIL) 10 MG tablet Take 10 mg by mouth daily.   5  . omeprazole (PRILOSEC) 20 MG capsule Take 20 mg by mouth daily.    . rizatriptan (MAXALT) 10 MG tablet Take 10 mg by mouth See admin instructions. Take 10 mg by mouth at onset of headache. Repeat in 2 hours if needed. Maximum 2 tablets in 24 hours.  3    Results for orders placed or performed during the hospital encounter of 05/13/18 (from the past 48 hour(s))  Lipase, blood     Status: None   Collection Time: 05/13/18  4:35 PM  Result Value Ref Range   Lipase 26 11 - 51 U/L    Comment: Performed at [facility], [address] [place], [place] [address]  Comprehensive metabolic panel     Status: Abnormal   Collection Time: 05/13/18  4:35 PM   Result Value Ref Range   Sodium 137 135 - 145 mmol/L   Potassium 3.6 3.5 - 5.1 mmol/L   Chloride 101 98 - 111 mmol/L   CO2 26 22 - 32 mmol/L   Glucose, Bld 116 (H) 70 - 99 mg/dL   BUN 15 6 - 20 mg/dL   Creatinine, Ser 0.93 0.44 - 1.00 mg/dL   Calcium 8.8 (L) 8.9 - 10.3 mg/dL   Total Protein 7.7 6.5 - 8.1 g/dL   Albumin 4.1 3.5 - 5.0 g/dL   AST 33 15 - 41 U/L   ALT 48 (H) 0 - 44 U/L   Alkaline Phosphatase 68 38 - 126 U/L   Total Bilirubin 0.6 0.3 - 1.2 mg/dL   GFR calc non Af Amer >60 >60 mL/min   GFR calc Af Amer >60 >60 mL/min   Anion gap 10 5 - 15    Comment: Performed at [facility], [address] [place], [place] [address]  CBC     Status: None   Collection Time: 05/13/18  4:35 PM  Result Value Ref Range   WBC 7.5 4.0 - 10.5 K/uL   RBC 4.48 3.87 - 5.11 MIL/uL   Hemoglobin 12.6 12.0 - 15.0 g/dL   HCT 38.6 36.0 - 46.0 %   MCV 86.2 80.0 - 100.0 fL   MCH 28.1 26.0 - 34.0 pg   MCHC 32.6 30.0 - 36.0 g/dL   RDW 12.6 11.5 - 15.5 %   Platelets 354 150 - 400 K/uL   nRBC 0.0 0.0 - 0.2 %    Comment: Performed at [facility], [address] [place], [place] [address]  TSH     Status: None   Collection Time: 05/13/18  4:35 PM  Result Value Ref Range   TSH 3.901 0.350 - 4.500 uIU/mL    Comment: Performed by a 3rd Generation assay with a functional sensitivity of <=0.01 uIU/mL. Performed at [facility], [address] [place], [place] [address]    Ct Abdomen Pelvis W Contrast  Result Date: 05/13/2018 CLINICAL DATA:  Central abdominal pain at the umbilicus for 1 week. Back pain for 2 months. Vomiting. History of colon cancer in remission. EXAM: CT ABDOMEN AND PELVIS WITH CONTRAST TECHNIQUE: Multidetector CT imaging of the abdomen and pelvis was performed using the standard protocol following bolus administration of intravenous contrast. CONTRAST:  145m OMNIPAQUE IOHEXOL 300 MG/ML  SOLN COMPARISON:  07/29/2017 FINDINGS: Lower chest: Focal area of  airspace consolidation in the left lingula may represent pneumonia. Atelectasis in the lung bases. Hepatobiliary: Diffuse fatty  infiltration of the liver. No focal liver lesions. Gallbladder and bile ducts are unremarkable. Pancreas: Unremarkable. No pancreatic ductal dilatation or surrounding inflammatory changes. Spleen: Normal in size without focal abnormality. Adrenals/Urinary Tract: No adrenal gland nodules. Bilateral parapelvic renal cysts. Nephrograms are symmetrical. No hydronephrosis or hydroureter. Bladder is unremarkable. Stomach/Bowel: Surgical anastomosis in the sigmoid colon. Scattered stool throughout the colon. Stomach, small bowel, and colon are not abnormally distended. Decompression of small bowel limits evaluation but there appears to be mild wall thickening in the terminal ileum with some interloop fluid which may indicate enteritis. This appearance is similar to that seen on previous study. Appendix is surgically absent. Vascular/Lymphatic: No significant vascular findings are present. No enlarged abdominal or pelvic lymph nodes. Minimal aortic calcification. Reproductive: Uterus and bilateral adnexa are unremarkable. Other: No free air or free fluid in the abdomen. Small periumbilical hernia containing fat. Musculoskeletal: No acute or significant osseous findings. IMPRESSION: 1. Focal area of airspace consolidation in the left lingula may represent pneumonia. 2. Mild wall thickening in the terminal ileum with interloop fluid may indicate enteritis. No evidence of bowel obstruction. 3. Diffuse fatty infiltration of the liver. 4. Bilateral parapelvic renal cysts. Aortic Atherosclerosis (ICD10-I70.0). Electronically Signed   By: Lucienne Capers M.D.   On: 05/13/2018 19:40    Review of Systems  Constitutional: Negative for chills and fever.  HENT: Negative for sore throat and tinnitus.   Eyes: Negative for blurred vision and redness.  Respiratory: Negative for cough and shortness of  breath.   Cardiovascular: Negative for chest pain, palpitations, orthopnea and PND.  Gastrointestinal: Negative for abdominal pain, diarrhea, nausea and vomiting.  Genitourinary: Negative for dysuria, frequency and urgency.  Musculoskeletal: Negative for joint pain and myalgias.  Skin: Negative for rash.       No lesions  Neurological: Negative for speech change, focal weakness and weakness.  Endo/Heme/Allergies: Does not bruise/bleed easily.       No temperature intolerance  Psychiatric/Behavioral: Negative for depression and suicidal ideas.    Blood pressure (!) 134/96, pulse 92, temperature (!) 97.5 F (36.4 C), temperature source Oral, resp. rate 17, height 5' 7"  (1.702 m), weight 104.3 kg, last menstrual period 10/12/2012, SpO2 97 %. Physical Exam  Vitals reviewed. Constitutional: She is oriented to person, place, and time. She appears well-developed and well-nourished. No distress.  HENT:  Head: Normocephalic and atraumatic.  Mouth/Throat: Oropharynx is clear and moist.  Eyes: Pupils are equal, round, and reactive to light. Conjunctivae and EOM are normal. No scleral icterus.  Neck: Normal range of motion. Neck supple. No JVD present. No tracheal deviation present. No thyromegaly present.  Cardiovascular: Normal rate, regular rhythm and normal heart sounds. Exam reveals no gallop and no friction rub.  No murmur heard. Respiratory: Effort normal and breath sounds normal.  GI: Soft. Bowel sounds are normal. She exhibits no distension. There is no abdominal tenderness.  Genitourinary:    Genitourinary Comments: Deferred   Musculoskeletal: Normal range of motion.        General: No edema.  Lymphadenopathy:    She has no cervical adenopathy.  Neurological: She is alert and oriented to person, place, and time. No cranial nerve deficit. She exhibits normal muscle tone.  Skin: Skin is warm and dry. No rash noted. No erythema.  Psychiatric: She has a normal mood and affect. Her  behavior is normal. Judgment and thought content normal.     Assessment/Plan This is a 53 year old female admitted for pneumonia. 1.  Pneumonia: Community-acquired; continue  ceftriaxone and azithromycin.  Supplemental oxygen as needed. 2.  Enteritis: Manage severe pain with IV morphine.  Hydrate with intravenous fluid.  Consult gastroenterology. 3.  Hypertension: Controlled; continue lisinopril 4.  Migraine headache: Continue abortive medication if severe headache develops.  Notably, the patient had an episode of atypical chest pain.  If she continues to have episodes during this hospitalization or otherwise she should have a stress test to evaluate for heart disease if she is to continue taking triptan's. 5.  DVT prophylaxis: Lovenox 6.  GI prophylaxis: Pantoprazole The patient is a full code.  Time spent on admission orders and patient care approximately 45 minutes  Harrie Foreman, MD 05/14/2018, 2:13 AM

## 2018-05-14 NOTE — Progress Notes (Signed)
RN called rapid response for sudden pressure/chest pain upon awaking. MD notified and came to bedside. EKG complete. Nitro and morphine administered with positive effect. On telemetry. Pt is now resting and denies pain. Will continue to monitor.

## 2018-05-14 NOTE — Progress Notes (Signed)
[facility] NAME: Sheri Calderon    MR#:  [id number]  DATE OF BIRTH:  06/18/1965  SUBJECTIVE:   Patient came in with mid abdominal pain. Denies any nausea vomiting. Has some cough. No fever. Better than yesterday. REVIEW OF SYSTEMS:   Review of Systems  Constitutional: Negative for chills, fever and weight loss.  HENT: Negative for ear discharge, ear pain and nosebleeds.   Eyes: Negative for blurred vision, pain and discharge.  Respiratory: Negative for sputum production, shortness of breath, wheezing and stridor.   Cardiovascular: Negative for chest pain, palpitations, orthopnea and PND.  Gastrointestinal: Positive for abdominal pain. Negative for diarrhea, nausea and vomiting.  Genitourinary: Negative for frequency and urgency.  Musculoskeletal: Negative for back pain and joint pain.  Neurological: Negative for sensory change, speech change, focal weakness and weakness.  Psychiatric/Behavioral: Negative for depression and hallucinations. The patient is not nervous/anxious.    Tolerating Diet:cld Tolerating PT: not needed  DRUG ALLERGIES:   Allergies  Allergen Reactions  . Lorazepam Hives and Swelling    Patient reports receiving lorazepam intensol in the hospital and experienced swelling with hives.    VITALS:  Blood pressure 125/75, pulse 80, temperature 97.6 F (36.4 C), temperature source Oral, resp. rate 17, height 5' 7"  (1.702 m), weight 99.6 kg, last menstrual period 10/12/2012, SpO2 99 %.  PHYSICAL EXAMINATION:   Physical Exam  GENERAL:  53 y.o.-year-old patient lying in the bed with no acute distress.  EYES: Pupils equal, round, reactive to light and accommodation. No scleral icterus. Extraocular muscles intact.  HEENT: Head atraumatic, normocephalic. Oropharynx and nasopharynx clear.  NECK:  Supple, no jugular venous distention. No thyroid enlargement, no tenderness.  LUNGS: Normal breath sounds  bilaterally, no wheezing, rales, rhonchi. No use of accessory muscles of respiration.  CARDIOVASCULAR: S1, S2 normal. No murmurs, rubs, or gallops.  ABDOMEN: Soft,mild central tender, nondistended.?ventral hernia Bowel sounds present. No organomegaly or mass.  EXTREMITIES: No cyanosis, clubbing or edema b/l.    NEUROLOGIC: Cranial nerves II through XII are intact. No focal Motor or sensory deficits b/l.   PSYCHIATRIC:  patient is alert and oriented x 3.  SKIN: No obvious rash, lesion, or ulcer.   LABORATORY PANEL:  CBC Recent Labs  Lab 05/13/18 1635  WBC 7.5  HGB 12.6  HCT 38.6  PLT 354    Chemistries  Recent Labs  Lab 05/13/18 1635  NA 137  K 3.6  CL 101  CO2 26  GLUCOSE 116*  BUN 15  CREATININE 0.93  CALCIUM 8.8*  AST 33  ALT 48*  ALKPHOS 68  BILITOT 0.6   Cardiac Enzymes Recent Labs  Lab 05/14/18 0743  TROPONINI <0.03   RADIOLOGY:  Ct Abdomen Pelvis W Contrast  Result Date: 05/13/2018 CLINICAL DATA:  Central abdominal pain at the umbilicus for 1 week. Back pain for 2 months. Vomiting. History of colon cancer in remission. EXAM: CT ABDOMEN AND PELVIS WITH CONTRAST TECHNIQUE: Multidetector CT imaging of the abdomen and pelvis was performed using the standard protocol following bolus administration of intravenous contrast. CONTRAST:  184m OMNIPAQUE IOHEXOL 300 MG/ML  SOLN COMPARISON:  07/29/2017 FINDINGS: Lower chest: Focal area of airspace consolidation in the left lingula may represent pneumonia. Atelectasis in the lung bases. Hepatobiliary: Diffuse fatty infiltration of the liver. No focal liver lesions. Gallbladder and bile ducts are unremarkable. Pancreas: Unremarkable. No pancreatic ductal dilatation or surrounding inflammatory changes. Spleen: Normal in size without focal abnormality. Adrenals/Urinary  Tract: No adrenal gland nodules. Bilateral parapelvic renal cysts. Nephrograms are symmetrical. No hydronephrosis or hydroureter. Bladder is unremarkable.  Stomach/Bowel: Surgical anastomosis in the sigmoid colon. Scattered stool throughout the colon. Stomach, small bowel, and colon are not abnormally distended. Decompression of small bowel limits evaluation but there appears to be mild wall thickening in the terminal ileum with some interloop fluid which may indicate enteritis. This appearance is similar to that seen on previous study. Appendix is surgically absent. Vascular/Lymphatic: No significant vascular findings are present. No enlarged abdominal or pelvic lymph nodes. Minimal aortic calcification. Reproductive: Uterus and bilateral adnexa are unremarkable. Other: No free air or free fluid in the abdomen. Small periumbilical hernia containing fat. Musculoskeletal: No acute or significant osseous findings. IMPRESSION: 1. Focal area of airspace consolidation in the left lingula may represent pneumonia. 2. Mild wall thickening in the terminal ileum with interloop fluid may indicate enteritis. No evidence of bowel obstruction. 3. Diffuse fatty infiltration of the liver. 4. Bilateral parapelvic renal cysts. Aortic Atherosclerosis (ICD10-I70.0). Electronically Signed   By: Lucienne Capers M.D.   On: 05/13/2018 19:40   ASSESSMENT AND PLAN:  53 year old female admitted for abdominal pain--  1. Abdominal pain suspected due to enteritis. -IV antibiotic with unasyn -clear liquid diet. Advance as tolerated -IV fluids -PRN pain meds -questionable ventral hernia--- patient advised to follow up with her surgeon at [facility] after discharge  2. Left lower lobe pneumonia as noted on CT scan -antibiotic should cover -PRN cough medicine  3.HTN continue home meds  4. DVT prophylaxis subcu Lovenox  Advised to ambulate. Drink fluids.  CODE STATUS full    TOTAL TIME TAKING CARE OF THIS PATIENT: *30** minutes.  >50% time spent on counselling and coordination of care  POSSIBLE D/C IN *1 to 2* DAYS, DEPENDING ON CLINICAL CONDITION.  Note: This dictation  was prepared with Dragon dictation along with smaller phrase technology. Any transcriptional errors that result from this process are unintentional.  Fritzi Mandes M.D on 05/14/2018 at 12:12 PM  Between 7am to 6pm - Pager - [phone]  After 6pm go to [email or web address] - password [email or web address] [facility]  Office  [phone]  CC: Primary care physician; Alvester Chou, NPPatient ID: Sheri Calderon, female   DOB: 10/21/1965, 53 y.o.   MRN: [id number]

## 2018-05-14 NOTE — Consult Note (Signed)
° ° ° ° R , MD °[address]  °[address]  °[place], [place] [address]  °Main: [phone]  °Fax: [phone] °Pager: [phone] ° ° Consultation ° °Referring Provider:     No ref. provider found °Primary Care Physician:  Barr, Julie, NP °Primary Gastroenterologist: None       °Reason for Consultation:     Ileitis  ° °Date of Admission:  05/13/2018 °Date of Consultation:  05/14/2018 °       ° HPI:   °Sheri Calderon is a 52 y.o. female with history of PTSD, known to me from prior admission with similar presentation in 06/2017.  CT revealed ileitis at that time as well, underwent colonoscopy which was unremarkable.  There was no evidence of inflammatory bowel disease.  She had a history of colon cancer, underwent right hemicolectomy with ileal colic anastomosis.  She now presents with central abdominal pain, nausea, worsening of diarrhea for last 1 week associated with dry cough.  She underwent CT during this admission which revealed mild wall thickening in the terminal ileum with interloop fluid may indicate enteritis with no evidence of bowel obstruction.  She is also incidentally found to have possible pneumonia.  She is currently being treated for community-acquired pneumonia.  Empirically started on antibiotics for enteritis as well.  She denies rectal bleeding, hematochezia.  She reports pain in the periumbilical region, thinks her ventral hernia is the cause of her pain.  She has been receiving opioid medication since admission.  Her diarrhea has improved, she had one bowel movement today which was soft and brown.  When I interviewed the patient, she asked me if she could eat.  She was tolerating clear liquid diet well.  CBC, CMP, TSH, hemoglobin A1c unremarkable. ° ° °NSAIDs: None ° °Antiplts/Anticoagulants/Anti thrombotics: None ° °GI Procedures: Colonoscopy in 2019 °- Preparation of the colon was fair. °- Erythematous mucosa in the terminal ileum. Biopsied. °- One 5 mm polyp in the ascending  colon, removed with a cold snare. Resected and retrieved. °- One 7 mm polyp in the transverse colon, removed with a hot snare. Resected and retrieved. °- One diminutive polyp in the rectum, removed with a cold biopsy forceps. Resected and retrieved. °- Normal mucosa in the entire examined colon. °- The distal rectum and anal verge are normal on retroflexion view. ° °DIAGNOSIS:  °A. TERMINAL ILEUM; COLD BIOPSY:  °- SUPERFICIAL SAMPLING OF SMALL INTESTINAL VILLI WITH NO SIGNIFICANT  °PATHOLOGIC ABNORMALITY.  °- DEEPER SECTIONS WERE EXAMINED.  °- NEGATIVE FOR ACTIVE ENTERITIS, DYSPLASIA, AND MALIGNANCY.  ° °B.  COLON POLYP, ASCENDING; COLD SNARE:  °- TUBULAR ADENOMA.  °- NEGATIVE FOR HIGH-GRADE DYSPLASIA AND MALIGNANCY.  ° °C.  COLON POLYP, TRANSVERSE; HOT SNARE:  °- TUBULAR ADENOMA.  °- NEGATIVE FOR HIGH-GRADE DYSPLASIA AND MALIGNANCY.  ° °D.  RECTUM POLYP; COLD BIOPSY:  °- HYPERPLASTIC POLYP.  °- NEGATIVE FOR DYSPLASIA AND MALIGNANCY.  ° ° °Past Medical History:  °Diagnosis Date  °• Allergic rhinitis   °• Anxiety   °• Ascites   ° acute  °• Atrial fibrillation (HCC)   ° resolved - does not see cardiologist  °• Colon cancer (HCC) 2014  ° cancer of the sigmoid colon  °• Depression   °• Diverticulitis   ° "this is my 2nd time in hospital w/this in the last 2 wk" (06/21/2012)  °• GERD (gastroesophageal reflux disease)   °• Gout   °• Hydronephrosis   °• Hypertension   °• Lower extremity edema   °• Migraines   °•   Migraines    Osteoarthritis    Peripheral neuropathy    PTSD (post-traumatic stress disorder)    Restless legs syndrome    S/P chemotherapy, time since less than 4 weeks    Tremor, essential     Past Surgical History:  Procedure Laterality Date   APPENDECTOMY     COLONOSCOPY N/A 08/01/2017   Procedure: COLONOSCOPY;  Surgeon: Lin Landsman, MD;  Location: [facility] ENDOSCOPY;  Service: Gastroenterology;  Laterality: N/A;   PARTIAL COLECTOMY N/A 06/28/2012   Procedure: PARTIAL COLECTOMY;  Surgeon: Harl Bowie, MD;  Location: [facility];  Service: General;  Laterality: N/A;   PORTACATH PLACEMENT N/A 07/11/2012   Procedure: INSERTION PORT-A-CATH;  Surgeon: Harl Bowie, MD;  Location: [facility];  Service: General;  Laterality: N/A;   portacath removal     TUBAL LIGATION  ~ 1989    Prior to Admission medications   Medication Sig Start Date End Date Taking? Authorizing Provider  acetaminophen (TYLENOL) 325 MG tablet Take 1 tablet (325 mg total) by mouth every 6 (six) hours as needed for mild pain or headache (or Fever >/= 101). 08/01/17  Yes Gouru, Aruna, MD  ALPRAZolam (XANAX) 0.5 MG tablet Take 0.5 mg by mouth 3 (three) times daily as needed for anxiety. 07/18/17  Yes [provider]  DULoxetine (CYMBALTA) 60 MG capsule Take 60 mg by mouth daily.    Yes [provider]  hydrochlorothiazide (HYDRODIURIL) 25 MG tablet Take 25 mg by mouth daily. 04/22/18  Yes [provider]  lisinopril (PRINIVIL,ZESTRIL) 10 MG tablet Take 10 mg by mouth daily.  12/28/15  Yes [provider]  omeprazole (PRILOSEC) 20 MG capsule Take 20 mg by mouth daily.   Yes [provider]  rizatriptan (MAXALT) 10 MG tablet Take 10 mg by mouth See admin instructions. Take 10 mg by mouth at onset of headache. Repeat in 2 hours if needed. Maximum 2 tablets in 24 hours. 06/10/17  Yes [provider]   Current Facility-Administered Medications:    0.9 %  sodium chloride infusion, , Intravenous, Continuous, Harrie Foreman, MD, Last Rate: 75 mL/hr at 05/14/18 1800   acetaminophen (TYLENOL) tablet 650 mg, 650 mg, Oral, Q6H PRN, 650 mg at 05/14/18 1751 **OR** acetaminophen (TYLENOL) suppository 650 mg, 650 mg, Rectal, Q6H PRN, Harrie Foreman, MD   albuterol (PROVENTIL) (2.5 MG/3ML) 0.083% nebulizer solution 2.5 mg, 2.5 mg, Nebulization, Q4H PRN, Harrie Foreman, MD, 2.5 mg at 05/14/18 0239   ALPRAZolam Duanne Moron) tablet 0.5 mg, 0.5 mg, Oral, TID PRN, Harrie Foreman, MD,  0.5 mg at 05/14/18 1156   Ampicillin-Sulbactam (UNASYN) 3 g in sodium chloride 0.9 % 100 mL IVPB, 3 g, Intravenous, Q6H, Fritzi Mandes, MD, Last Rate: 200 mL/hr at 05/14/18 1801, 3 g at 05/14/18 1801   docusate sodium (COLACE) capsule 100 mg, 100 mg, Oral, BID, Harrie Foreman, MD   DULoxetine (CYMBALTA) DR capsule 60 mg, 60 mg, Oral, Daily, Harrie Foreman, MD, 60 mg at 05/14/18 1156   enoxaparin (LOVENOX) injection 40 mg, 40 mg, Subcutaneous, Q24H, Harrie Foreman, MD, 40 mg at 05/14/18 0006   hydrochlorothiazide (HYDRODIURIL) tablet 25 mg, 25 mg, Oral, Daily, Harrie Foreman, MD   lisinopril (PRINIVIL,ZESTRIL) tablet 10 mg, 10 mg, Oral, Daily, Harrie Foreman, MD   morphine 2 MG/ML injection 2 mg, 2 mg, Intravenous, Q4H PRN, Fritzi Mandes, MD, 2 mg at 05/14/18 1346   ondansetron (ZOFRAN) tablet 4 mg, 4 mg, Oral, Q6H  injection 4 mg, 4 mg, Intravenous, Q6H PRN, Diamond, Michael S, MD, 4 mg at 05/14/18 1347 °•  pantoprazole (PROTONIX) EC tablet 40 mg, 40 mg, Oral, Daily, Diamond, Michael S, MD, 40 mg at 05/14/18 1156 °•  SUMAtriptan (IMITREX) tablet 50 mg, 50 mg, Oral, TID PRN, Patel, Sona, MD, 50 mg at 05/14/18 1152 ° ° °Family History  °Problem Relation Age of Onset  °• Diabetes Mother   °• Heart disease Mother   °• Stomach cancer Mother   °• Ovarian cancer Mother 69  °• COPD Father   °• Hypertension Father   °• Hyperlipidemia Father   °• Colon polyps Father   °     11 lifetime colon polyps  °• Down syndrome Paternal Aunt   °• Cancer Paternal Uncle   °     cancer in the spine  °• COPD Maternal Grandmother   °• Stomach cancer Paternal Grandmother   °  ° °Social History  ° °Tobacco Use  °• Smoking status: Current Every Day Smoker  °  Packs/day: 0.20  °  Years: 15.00  °  Pack years: 3.00  °  Types: Cigarettes  °  Start date: 01/17/1991  °• Smokeless tobacco: Never Used  °• Tobacco comment: QUIT 2 YEARS AGO  °Substance Use Topics  °• Alcohol use: No  °   Alcohol/week: 0.0 standard drinks  °• Drug use: No  ° ° °Allergies as of 05/13/2018 - Review Complete 05/13/2018  °Allergen Reaction Noted  °• Lorazepam Hives and Swelling 08/01/2012  ° ° °Review of Systems:    °All systems reviewed and negative except where noted in HPI. ° ° Physical Exam:  °Vital signs in last 24 hours: °Temp:  [97.5 °F (36.4 °C)-97.8 °F (36.6 °C)] 97.8 °F (36.6 °C) (03/14 1542) °Pulse Rate:  [80-100] 100 (03/14 1542) °Resp:  [17-18] 18 (03/14 1542) °BP: (112-147)/(75-100) 147/88 (03/14 1542) °SpO2:  [92 %-99 %] 92 % (03/14 1542) °Weight:  [99.6 kg] 99.6 kg (03/14 0432) °Last BM Date: 05/13/18 °General:   Pleasant, cooperative in NAD °Head:  Normocephalic and atraumatic. °Eyes:   No icterus.   Conjunctiva pink. PERRLA. °Ears:  Normal auditory acuity. °Neck:  Supple; no masses or thyroidomegaly °Lungs: Respirations even and unlabored. Lungs clear to auscultation bilaterally.   No wheezes, crackles, or rhonchi.  °Heart:  Regular rate and rhythm;  Without murmur, clicks, rubs or gallops °Abdomen:  Soft, obese, nondistended, mild tenderness in the supraumbilical region, small ventral hernia upon coughing. Normal bowel sounds. No appreciable masses or hepatomegaly.  No rebound or guarding.  °Rectal:  Not performed. °Msk:  Symmetrical without gross deformities.  Strength normal °Extremities:  Without edema, cyanosis or clubbing. °Neurologic:  Alert and oriented x3;  grossly normal neurologically. °Skin:  Intact without significant lesions or rashes. °Psych:  Alert and cooperative. Normal affect. ° °LAB RESULTS: °CBC Latest Ref Rng & Units 05/13/2018 07/29/2017 11/10/2016  °WBC 4.0 - 10.5 K/uL 7.5 11.3(H) 6.6  °Hemoglobin 12.0 - 15.0 g/dL 12.6 13.5 13.2  °Hematocrit 36.0 - 46.0 % 38.6 39.7 40.1  °Platelets 150 - 400 K/uL 354 406 344  ° ° °BMET °BMP Latest Ref Rng & Units 05/13/2018 07/29/2017 11/10/2016  °Glucose 70 - 99 mg/dL 116(H) 120(H) 111  °BUN 6 - 20 mg/dL 15 14 9  °Creatinine 0.44 - 1.00 mg/dL 0.93  0.92 1.1  °Sodium 135 - 145 mmol/L 137 137 143  °Potassium 3.5 - 5.1 mmol/L 3.6 3.6 4.5  °Chloride 98 - 111 mmol/L 101 103   103  °CO2 22 - 32 mmol/L 26 22 30  °Calcium 8.9 - 10.3 mg/dL 8.8(L) 9.3 9.8  ° ° °LFT °Hepatic Function Latest Ref Rng & Units 05/13/2018 07/29/2017 11/10/2016  °Total Protein 6.5 - 8.1 g/dL 7.7 8.3(H) 7.4  °Albumin 3.5 - 5.0 g/dL 4.1 4.5 3.3  °AST 15 - 41 U/L 33 33 32  °ALT 0 - 44 U/L 48(H) 39 38  °Alk Phosphatase 38 - 126 U/L 68 63 66  °Total Bilirubin 0.3 - 1.2 mg/dL 0.6 0.4 0.50  °Bilirubin, Direct 0.1 - 0.5 mg/dL - - -  ° ° ° °STUDIES: °Ct Abdomen Pelvis W Contrast ° °Result Date: 05/13/2018 °CLINICAL DATA:  Central abdominal pain at the umbilicus for 1 week. Back pain for 2 months. Vomiting. History of colon cancer in remission. EXAM: CT ABDOMEN AND PELVIS WITH CONTRAST TECHNIQUE: Multidetector CT imaging of the abdomen and pelvis was performed using the standard protocol following bolus administration of intravenous contrast. CONTRAST:  100mL OMNIPAQUE IOHEXOL 300 MG/ML  SOLN COMPARISON:  07/29/2017 FINDINGS: Lower chest: Focal area of airspace consolidation in the left lingula may represent pneumonia. Atelectasis in the lung bases. Hepatobiliary: Diffuse fatty infiltration of the liver. No focal liver lesions. Gallbladder and bile ducts are unremarkable. Pancreas: Unremarkable. No pancreatic ductal dilatation or surrounding inflammatory changes. Spleen: Normal in size without focal abnormality. Adrenals/Urinary Tract: No adrenal gland nodules. Bilateral parapelvic renal cysts. Nephrograms are symmetrical. No hydronephrosis or hydroureter. Bladder is unremarkable. Stomach/Bowel: Surgical anastomosis in the sigmoid colon. Scattered stool throughout the colon. Stomach, small bowel, and colon are not abnormally distended. Decompression of small bowel limits evaluation but there appears to be mild wall thickening in the terminal ileum with some interloop fluid which may indicate enteritis. This  appearance is similar to that seen on previous study. Appendix is surgically absent. Vascular/Lymphatic: No significant vascular findings are present. No enlarged abdominal or pelvic lymph nodes. Minimal aortic calcification. Reproductive: Uterus and bilateral adnexa are unremarkable. Other: No free air or free fluid in the abdomen. Small periumbilical hernia containing fat. Musculoskeletal: No acute or significant osseous findings. IMPRESSION: 1. Focal area of airspace consolidation in the left lingula may represent pneumonia. 2. Mild wall thickening in the terminal ileum with interloop fluid may indicate enteritis. No evidence of bowel obstruction. 3. Diffuse fatty infiltration of the liver. 4. Bilateral parapelvic renal cysts. Aortic Atherosclerosis (ICD10-I70.0). Electronically Signed   By: William  Stevens M.D.   On: 05/13/2018 19:40  ° ° ° ° Impression / Plan:  ° °Sheri Calderon is a 52 y.o. female with h/o colon cancer s/p right hemicolectomy, ileocolic anastomosis, PTSD, admitted with abdominal pain, diarrhea, CT revealed possible ileitis. SIBO could be a possibility.  Her symptoms are improving.  Diarrhea resolved ° °- Ok to continue empiric antibiotics °- Recommend to discharge on Cipro and Flagyl for 2 weeks for probable bacterial overgrowth °- Advance diet as tolerated °- She can follow-up with GI in 4 weeks after discharge ° °Thank you for involving me in the care of this patient.  GI will sign off at this time, please call us back with questions or concerns ° ° ° LOS: 1 day  ° ° , MD  05/14/2018, 6:52 PM ° ° ° Note: This dictation was prepared with Dragon dictation along with smaller phrase technology. Any transcriptional errors that result from this process are unintentional.  °

## 2018-05-15 NOTE — Progress Notes (Signed)
At approximately 1920 (3/14), pnt began asking for food. She complains that she feels hungry and that she needs something other than broth and jello. Explained to pnt that doctor ordered her to be full liquid because of the ABD pain and nausea and they didn't want it to worsen. Offered her options within the full liquid diet which she declined other than shasta lemon lime.   Pnt made multiple requests asking for food not included in her full liquid diet to myself and other nursing staff that came into her room. Re-educated on importance of diet restriction and that she can speak with the MD in the am who can discuss with her the plan on diet. Diet was just advanced 3/14 to full liquids.   Pnt was then found at approximately 0130 walking on another unit asking staff to give her graham crackers and peanut butter. Nursing staff walked the pnt back over to 1A. Pnt requested to leave the hospital because she feels like we are starving her. Offered options within full liquid diet but she refused, offered to get the AMA form for pnt which she refused to sign.    Pnt was very agitated and anxious so we called for nursing supervisor. Pnt began getting her belongings together and said she is leaving. Removed pnts IV's per request. Nursing supervisor at bedside. Pnt left AMA at approximately 0230 (3/15). Offered to call security to walk her out but she refused.

## 2018-05-18 LAB — CULTURE, BLOOD (ROUTINE X 2)
CULTURE: NO GROWTH
Culture: NO GROWTH
SPECIAL REQUESTS: ADEQUATE
Special Requests: ADEQUATE

## 2018-05-18 NOTE — Discharge Summary (Signed)
Patient was admitted with abdominal pain. CT revealed possible iliac test. She was started on IV antibiotics IV fluids.  She decided to leave AMA.  No medications, follow-up were made since patient left AMA.  No final discharge diagnosis since patient left AMA.

## 2018-06-20 ENCOUNTER — Other Ambulatory Visit: Payer: Self-pay | Admitting: Surgery

## 2018-06-29 ENCOUNTER — Other Ambulatory Visit: Payer: Self-pay | Admitting: Surgery

## 2018-07-22 ENCOUNTER — Other Ambulatory Visit: Payer: Self-pay

## 2018-07-22 ENCOUNTER — Emergency Department (HOSPITAL_COMMUNITY)
Admission: EM | Admit: 2018-07-22 | Discharge: 2018-07-22 | Disposition: A | Discharge status: Home / Self Care | Payer: Medicare Other | Attending: Emergency Medicine | Admitting: Emergency Medicine

## 2018-07-22 ENCOUNTER — Encounter (HOSPITAL_COMMUNITY): Payer: Self-pay | Admitting: Emergency Medicine

## 2018-07-22 DIAGNOSIS — R109 Unspecified abdominal pain: Secondary | ICD-10-CM | POA: Insufficient documentation

## 2018-07-22 DIAGNOSIS — R1111 Vomiting without nausea: Secondary | ICD-10-CM | POA: Insufficient documentation

## 2018-07-22 DIAGNOSIS — Z5321 Procedure and treatment not carried out due to patient leaving prior to being seen by health care provider: Secondary | ICD-10-CM | POA: Diagnosis not present

## 2018-07-22 LAB — COMPREHENSIVE METABOLIC PANEL
ALT: 29 U/L (ref 0–44)
AST: 23 U/L (ref 15–41)
Albumin: 3.9 g/dL (ref 3.5–5.0)
Alkaline Phosphatase: 73 U/L (ref 38–126)
Anion gap: 12 (ref 5–15)
BUN: 16 mg/dL (ref 6–20)
CO2: 25 mmol/L (ref 22–32)
Calcium: 9.6 mg/dL (ref 8.9–10.3)
Chloride: 101 mmol/L (ref 98–111)
Creatinine, Ser: 0.99 mg/dL (ref 0.44–1.00)
GFR calc Af Amer: >60 mL/min (ref >60)
GFR calc non Af Amer: >60 mL/min (ref >60)
Glucose, Bld: 108 mg/dL — ABNORMAL HIGH (ref 70–99)
Potassium: 3.7 mmol/L (ref 3.5–5.1)
Sodium: 138 mmol/L (ref 135–145)
Total Bilirubin: 0.7 mg/dL (ref 0.3–1.2)
Total Protein: 7.4 g/dL (ref 6.5–8.1)

## 2018-07-22 LAB — CBC
HCT: 43 % (ref 36.0–46.0)
Hemoglobin: 14 g/dL (ref 12.0–15.0)
MCH: 27.9 pg (ref 26.0–34.0)
MCHC: 32.6 g/dL (ref 30.0–36.0)
MCV: 85.7 fL (ref 80.0–100.0)
Platelets: 429 K/uL — ABNORMAL HIGH (ref 150–400)
RBC: 5.02 MIL/uL (ref 3.87–5.11)
RDW: 13.2 % (ref 11.5–15.5)
WBC: 9.4 K/uL (ref 4.0–10.5)
nRBC: 0 % (ref 0.0–0.2)

## 2018-07-22 LAB — I-STAT BETA HCG BLOOD, ED (MC, WL, AP ONLY): I-stat hCG, quantitative: <5 m[IU]/mL

## 2018-07-22 LAB — LIPASE, BLOOD: Lipase: 28 U/L (ref 11–51)

## 2018-07-22 NOTE — ED Triage Notes (Signed)
Pt reports a hernia that she is scheduled for surgery on June 3. Pt reports last evening she started to feel a contraction type pain. Pt reports she called her surgeon and was told to come into the ER.

## 2018-08-01 ENCOUNTER — Other Ambulatory Visit (HOSPITAL_COMMUNITY)
Admission: RE | Admit: 2018-08-01 | Discharge: 2018-08-01 | Disposition: A | Discharge status: Home / Self Care | Payer: Commercial Managed Care - PPO | Source: Ambulatory Visit | Attending: Surgery | Admitting: Surgery

## 2018-08-01 ENCOUNTER — Encounter (HOSPITAL_COMMUNITY): Payer: Self-pay

## 2018-08-01 ENCOUNTER — Other Ambulatory Visit: Payer: Self-pay

## 2018-08-01 ENCOUNTER — Encounter (HOSPITAL_COMMUNITY)
Admission: RE | Admit: 2018-08-01 | Discharge: 2018-08-01 | Disposition: A | Discharge status: Home / Self Care | Payer: Commercial Managed Care - PPO | Source: Ambulatory Visit | Attending: Surgery | Admitting: Surgery

## 2018-08-01 DIAGNOSIS — Z87891 Personal history of nicotine dependence: Secondary | ICD-10-CM | POA: Diagnosis not present

## 2018-08-01 DIAGNOSIS — Z9049 Acquired absence of other specified parts of digestive tract: Secondary | ICD-10-CM | POA: Diagnosis not present

## 2018-08-01 DIAGNOSIS — K219 Gastro-esophageal reflux disease without esophagitis: Secondary | ICD-10-CM | POA: Diagnosis not present

## 2018-08-01 DIAGNOSIS — Z01812 Encounter for preprocedural laboratory examination: Secondary | ICD-10-CM | POA: Insufficient documentation

## 2018-08-01 DIAGNOSIS — J449 Chronic obstructive pulmonary disease, unspecified: Secondary | ICD-10-CM | POA: Diagnosis not present

## 2018-08-01 DIAGNOSIS — Z1159 Encounter for screening for other viral diseases: Secondary | ICD-10-CM | POA: Insufficient documentation

## 2018-08-01 DIAGNOSIS — Z79899 Other long term (current) drug therapy: Secondary | ICD-10-CM | POA: Diagnosis not present

## 2018-08-01 DIAGNOSIS — I10 Essential (primary) hypertension: Secondary | ICD-10-CM | POA: Diagnosis not present

## 2018-08-01 DIAGNOSIS — F419 Anxiety disorder, unspecified: Secondary | ICD-10-CM | POA: Diagnosis not present

## 2018-08-01 DIAGNOSIS — K432 Incisional hernia without obstruction or gangrene: Secondary | ICD-10-CM | POA: Diagnosis not present

## 2018-08-01 DIAGNOSIS — I4891 Unspecified atrial fibrillation: Secondary | ICD-10-CM | POA: Diagnosis not present

## 2018-08-01 DIAGNOSIS — Z85038 Personal history of other malignant neoplasm of large intestine: Secondary | ICD-10-CM | POA: Diagnosis not present

## 2018-08-01 DIAGNOSIS — F329 Major depressive disorder, single episode, unspecified: Secondary | ICD-10-CM | POA: Diagnosis not present

## 2018-08-01 HISTORY — DX: Cardiac arrhythmia, unspecified: I49.9

## 2018-08-01 NOTE — Pre-Procedure Instructions (Signed)
[id number] [place] - [address] [place] [address]Phone: [phone]: [phone]     Your procedure is scheduled on Wednesday, June 3rd.  [facility].M., and check in at the Admitting office.  Call this number if you have problems the morning of surgery:  [phone] Call [phone] you have any questions prior to your surgery date Monday-Friday 8am-4pm    Remember:  Do not eat after midnight.  You may drink clear liquids until 8:30 A.M. (3 hours prior to our procedure).   Clear liquids allowed are: Water, Non-Citrus Juices (without pulp), Carbonated Beverages, Clear Tea, Black Coffee Only, and Gatorade    Take these medicines the morning of surgery with A SIP OF WATER  ALPRAZolam (XANAX) DULoxetine (CYMBALTA)  fluticasone (FLONASE) omeprazole (PRILOSEC)  If needed: Tylenol, ondansetron (Zofran), and rizatriptan (MAXALT).    As of today, STOP taking any Aspirin (unless otherwise instructed by your surgeon), Aleve, Naproxen, Ibuprofen, Motrin, Advil, Goody's, BC's, all herbal medications, fish oil, and all vitamins.    The Morning of Surgery  Do not wear jewelry, make-up or nail polish.  Do not wear lotions, powders, or perfumes/colognes, or deodorant  Do not shave 48 hours prior to surgery.  Men may shave face and neck.  Do not bring valuables to the hospital.  [facility] any belongings or valuables.  If you are a smoker, DO NOT Smoke 24 hours prior to surgery IF you wear a CPAP at night please bring your mask, tubing, and machine the morning of surgery   Remember that you must have someone to transport you home after your surgery, and remain with you for 24 hours if you are discharged the same day.   Contacts, glasses, hearing aids, dentures or bridgework may not be worn into surgery.    Leave your suitcase in the car.  After surgery it may be  brought to your room.  For patients admitted to the hospital, discharge time will be determined by your treatment team.  Patients discharged the day of surgery will not be allowed to drive home.    Special instructions:   [facility]- Preparing For Surgery  Before surgery, you can play an important role. Because skin is not sterile, your skin needs to be as free of germs as possible. You can reduce the number of germs on your skin by washing with CHG (chlorahexidine gluconate) Soap before surgery.  CHG is an antiseptic cleaner which kills germs and bonds with the skin to continue killing germs even after washing.    Oral Hygiene is also important to reduce your risk of infection.  Remember - BRUSH YOUR TEETH THE MORNING OF SURGERY WITH YOUR REGULAR TOOTHPASTE  Please do not use if you have an allergy to CHG or antibacterial soaps. If your skin becomes reddened/irritated stop using the CHG.  Do not shave (including legs and underarms) for at least 48 hours prior to first CHG shower. It is OK to shave your face.  Please follow these instructions carefully.   1. Shower the NIGHT BEFORE SURGERY and the MORNING OF SURGERY with CHG Soap.   2. If you chose to wash your hair, wash your hair first as usual with your normal shampoo.  3. After you shampoo, rinse your hair and body thoroughly to remove the shampoo.  4. Use CHG as you would any other liquid soap. You  can apply CHG directly to the skin and wash gently with a scrungie or a clean washcloth.   5. Apply the CHG Soap to your body ONLY FROM THE NECK DOWN.  Do not use on open wounds or open sores. Avoid contact with your eyes, ears, mouth and genitals (private parts). Wash Face and genitals (private parts)  with your normal soap.   6. Wash thoroughly, paying special attention to the area where your surgery will be performed.  7. Thoroughly rinse your body with warm water from the neck down.  8. DO NOT shower/wash with your normal soap  after using and rinsing off the CHG Soap.  9. Pat yourself dry with a CLEAN TOWEL.  10. Wear CLEAN PAJAMAS to bed the night before surgery, wear comfortable clothes the morning of surgery  11. Place CLEAN SHEETS on your bed the night of your first shower and DO NOT SLEEP WITH PETS.    Day of Surgery:  Do not apply any deodorants/lotions.  Please wear clean clothes to the hospital/surgery center.   Remember to brush your teeth WITH YOUR REGULAR TOOTHPASTE.   Please read over the following fact sheets that you were given.

## 2018-08-01 NOTE — Progress Notes (Signed)
PCP - Alvester Chou, NP Cardiologist - denies  Chest x-ray - N/A EKG - 05/14/18 Stress Test - 10+ years ago ECHO - denies Cardiac Cath - denies  Sleep Study - denies CPAP - denies  Blood Thinner Instructions:N/A Aspirin Instructions:N/A  Anesthesia review: Yes; EKG review  Patient denies shortness of breath, fever, cough and chest pain at PAT appointment   Patient verbalized understanding of instructions that were given to them at the PAT appointment. Patient was also instructed that they will need to review over the PAT instructions again at home before surgery.  Patient going to Covid-19 testing site after PAT appointment today.    Coronavirus Screening  Have you experienced the following symptoms:  Cough yes/no: No Fever (>100.73F)  yes/no: No Runny nose yes/no: No Sore throat yes/no: No Difficulty breathing/shortness of breath  yes/no: No  Have you or a family member traveled in the last 14 days and where? yes/no: No   If the patient indicates "YES" to the above questions, their PAT will be rescheduled to limit the exposure to others and, the surgeon will be notified. THE PATIENT WILL NEED TO BE ASYMPTOMATIC FOR 14 DAYS.   If the patient is not experiencing any of these symptoms, the PAT nurse will instruct them to NOT bring anyone with them to their appointment since they may have these symptoms or traveled as well.   Please remind your patients and families that hospital visitation restrictions are in effect and the importance of the restrictions.

## 2018-08-02 ENCOUNTER — Other Ambulatory Visit: Payer: Self-pay | Admitting: Surgery

## 2018-08-02 ENCOUNTER — Encounter (HOSPITAL_COMMUNITY): Payer: Self-pay

## 2018-08-02 NOTE — H&P (Signed)
Sheri Calderon  Location: [facility] Patient #: [id number] DOB: 13-Jul-1965 Married / Language: English / Race: White Female   History of Present Illness   The patient is a 53 year old female who presents with an incisional hernia. This patient is known to me. She is a 53 year old female who I performed an exploratory laparotomy on 2014 for suspected diverticulitis. 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 She underwent chemotherapy. She has now been over 5 years cancer free. She continues to be followed by the oncologist in [place]. Her last endoscopy last year was unremarkable except for some inflammation of the distal ileum. She has been having increasing central abdominal pain with occasional nausea. She underwent a CAT scan in March of this year which was unremarkable except for some mild inflammation of the distal ileum suspected to be ileitis. She did also have an incisional hernia containing omentum. She reports her bowel movements are normal. Her pain is mild to moderate and again is described as sharp. It does not refer anywhere else.   Past Surgical History  Resection of Small Bowel   Diagnostic Studies History  Colonoscopy  within last year Mammogram  within last year Pap Smear  1-5 years ago  Allergies ( No Known Drug Allergies  Allergies Reconciled   Medication History  Lisinopril (10MG Tablet, Oral) Active. Omeprazole (20MG Capsule DR, Oral) Active. Rizatriptan Benzoate (10MG Tablet, Oral) Active. ALPRAZolam (0.5MG Tablet, Oral) Active. DULoxetine HCl (60MG Capsule DR Part, Oral) Active. Medications Reconciled  Social History  Alcohol use  Occasional alcohol use. Caffeine use  Carbonated beverages, Tea. No drug use  Tobacco use  Former smoker.  Family History Arthritis  Father, Mother. Cervical Cancer  Mother. Depression  Mother. Diabetes Mellitus  Mother. Heart Disease  Father,  Mother. Heart disease in female family member before age 66  Heart disease in female family member before age 1  Hypertension  Father. Ischemic Bowel Disease  Mother. Ovarian Cancer  Mother.  Pregnancy / Birth History  Age at menarche  10 years. Age of menopause  <44 Gravida  1 Maternal age  48-20 Para  1  Other Problems  Anxiety Disorder  Back Pain  Chronic Obstructive Lung Disease  Colon Cancer  Depression     Review of Systems General Present- Appetite Loss and Weight Gain. Not Present- Chills, Fatigue, Fever, Night Sweats and Weight Loss. Skin Present- Dryness. Not Present- Change in Wart/Mole, Hives, Jaundice, New Lesions, Non-Healing Wounds, Rash and Ulcer. HEENT Present- Wears glasses/contact lenses. Not Present- Earache, Hearing Loss, Hoarseness, Nose Bleed, Oral Ulcers, Ringing in the Ears, Seasonal Allergies, Sinus Pain, Sore Throat, Visual Disturbances and Yellow Eyes. Respiratory Not Present- Bloody sputum, Chronic Cough, Difficulty Breathing, Snoring and Wheezing. Breast Not Present- Breast Mass, Breast Pain, Nipple Discharge and Skin Changes. Cardiovascular Present- Difficulty Breathing Lying Down and Swelling of Extremities. Not Present- Chest Pain, Leg Cramps, Palpitations, Rapid Heart Rate and Shortness of Breath. Gastrointestinal Present- Abdominal Pain, Bloating, Change in Bowel Habits, Gets full quickly at meals, Indigestion, Nausea and Vomiting. Not Present- Bloody Stool, Chronic diarrhea, Constipation, Difficulty Swallowing, Excessive gas, Hemorrhoids and Rectal Pain. Female Genitourinary Present- Frequency and Urgency. Not Present- Nocturia, Painful Urination and Pelvic Pain. Musculoskeletal Present- Back Pain. Not Present- Joint Pain, Joint Stiffness, Muscle Pain, Muscle Weakness and Swelling of Extremities. Neurological Present- Headaches, Numbness, Tingling and Tremor. Not Present- Decreased Memory, Fainting, Seizures, Trouble walking and  Weakness. Psychiatric Present- Anxiety. Not Present- Bipolar,  Change in Sleep Pattern, Depression, Fearful and Frequent crying. Endocrine Present- Hot flashes. Not Present- Cold Intolerance, Excessive Hunger, Hair Changes, Heat Intolerance and New Diabetes.  Vitals  Weight: 212.6 lb Height: 67in Body Surface Area: 2.08 m Body Mass Index: 33.3 kg/m  Temp.: 98.22F(Oral)  Pulse: 116 (Regular)  P.OX: 97% (Room air) BP: 154/90 (Sitting, Left Arm, Standard)    Physical Exam  General Mental Status-Alert. General Appearance-Consistent with stated age. Hydration-Well hydrated. Voice-Normal.  Head and Neck Head-normocephalic, atraumatic with no lesions or palpable masses. Trachea-midline.  Eye Eyeball - Bilateral-Extraocular movements intact. Sclera/Conjunctiva - Bilateral-No scleral icterus.  Chest and Lung Exam Chest and lung exam reveals -quiet, even and easy respiratory effort with no use of accessory muscles and on auscultation, normal breath sounds, no adventitious sounds and normal vocal resonance. Inspection Chest Wall - Normal. Back - normal.  Cardiovascular Cardiovascular examination reveals -normal heart sounds, regular rate and rhythm with no murmurs and normal pedal pulses bilaterally.  Abdomen Inspection Skin - Scar - no surgical scars. Hernias - Incisional - Incarcerated. Note: There is a chronically incarcerated incisional hernia around the middle of the abdomen and umbilicus. It is mildly tender and nonreducible. Her midline incision well-healed. Palpation/Percussion Palpation and Percussion of the abdomen reveal - Soft, Non Tender, No Rebound tenderness, No Rigidity (guarding) and No hepatosplenomegaly. Auscultation Auscultation of the abdomen reveals - Bowel sounds normal.  Neurologic - Did not examine.  Musculoskeletal - Did not examine.    Assessment & Plan   INCISIONAL HERNIA (K43.2)  Impression: I have reviewed her  CT scan in previous notes. Again, despite positive lymph nodes, she has done very well and remains cancer free for over 5 years. We discussed the diagnosis of incisional hernia in detail. We discussed hernia repair with mesh. We discussed both the open and laparoscopic techniques. I recommend a laparoscopic incisional hernia repair with mesh. This would allow me to evaluate the terminal ileum for any signs of recurrent disease. I discussed the surgical procedure in detail gave her literature. I reviewed the CT scans. I discussed the risks which includes but is not limited to bleeding, infection, injury to surrounding structures, need to convert to an open procedure, the possible need for bowel resection, postoperative recovery, hernia recurrence, DVT, etc. I encouraged her to quit smoking preoperatively. She understands and agreed to proceed with surgery when possible

## 2018-08-02 NOTE — Progress Notes (Addendum)
Anesthesia Chart Review:  Case:  [id number] Date/Time:  08/03/18 1115   Procedure:  LAPAROSCOPIC POSSIBLE OPEN INCISIONAL HERNIA REPAIR WITH MESH (N/A )   Anesthesia type:  General   Pre-op diagnosis:  INCISIONAL HERNIA   Location:  [facility] OR ROOM 02 / [place] OR   Surgeon:  Coralie Keens, MD      DISCUSSION: Patient is a 53 year old female scheduled for the above procedure.  History includes smoking, afib episode (06/2010 following emotionally stressful event; was hospitalized with stress test +/- echo at [facility]; had ectopic atrial rhythm 06/27/12 in setting of diverticuiltis/abscess/tumor), colon cancer (s/p partial colectomy/appendectomy 06/28/12, left [place] Port-a-cath 07/11/12, s/p chemoradiation), PTSD, essential tremor, HTN, peripheral neuropathy. (Hydronephrosis and acute ascites were listed in her history documented on 02/28/14, but I don't see this supported in the [facility] abd/pelvis imaging from 09/2008-05/2018, other than mention of renal cysts, hepatic steatosis, and small amount of fluid in the setting of when she had colonic abscess. I asked her about this. She denied any known liver or kidney disease, so ascites and hydronephrosis removed from her history as these do not appear to be ongoing issues.) - Admitted 05/13/18-05/15/18 for abdominal pain, abdominal distention, coughing, low back pain, nausea. She also had a single episode of chest pain (while in the ED). Serial troponins negative x3. She was treated with antibiotics for finding of possible enteritis and lingular pneumonia on CT abd/pelvis. GI recommended Cipro and Flagyl. She ultimately left AMA 05/15/18 after becoming upset that staff would not advance her to regular food.     I was eventally able to get in touch with patient by phone around 3:00 PM 08/02/18. She denied any known recurrent afib since 06/2010 [facility] hospitalization for what sounds like chest pain and tachycardia after becoming extremely angry with an individual. I  do not have those records currently available, but can see that she did have a stress test 06/23/10 (although report is not viewable in PACS). She thinks she may have had an echo as well. She reports she did not require out-patient cardiology follow-up or anticoagulation. She reports up until about three months ago, she was very active--walking regularly and playing basketball with her 29 year old grandsons. Now her activity is limited by her hernia symptoms--also trying not to aggravate it. She has noticed increase size in the past two weeks. She is having frequent loss stools and frequent cramping. She has had intermittent vomiting. No definite fever.  Apparently, she has been in communication with [facility], but unless more rapid progression of symptoms she did not want to go to the ED because she wants surgery done by Dr. Ninfa Linden. She denied chest pain. Reported some SOB felt related to her abdominal bloating. No significant edema, but says her legs feel weak and she has been having back pain with standing. She is a smoker, but no reported DM, CAD, MI, CHF history.  Prior to talking with patient, I had reviewed case with anesthesiologist Oleta Mouse, MD. He was in agreement with repeating EKG since her EKG from 05/13/18 seems consistent with LA/RA limb reversal--and there is also baseline wanderer. Unless with new concerning EKG changes or CV symptoms then it is anticipated that she can proceed as planned--however, definitive plan per assigned anesthesiologist following his/her evaluation. 08/01/18 COVID test is still in process. (ADDENDUM 08/02/18 6:25 PM: Copies of 2012 stress and echo received from [facility] and outlined below.)   VS: BP (!) 143/88   Pulse 85  Temp 37 C   Resp 20   Ht 5' 7"  (1.702 m)   LMP 10/12/2012 (Exact Date)   SpO2 97%   BMI 32.73 kg/m    PROVIDERS: Alvester Chou, NP is PCP  - Last visit with [facility] seen on 11/10/16 with Laverna Peace, NP. Last colonoscopy 08/01/17 by  Sherri Sear, MD [facility]). - She is not followed by cardiology   LABS: Labs as of 07/22/18 included: Lab Results  Component Value Date   WBC 9.4 07/22/2018   HGB 14.0 07/22/2018   HCT 43.0 07/22/2018   PLT 429 (H) 07/22/2018   GLUCOSE 108 (H) 07/22/2018   ALT 29 07/22/2018   AST 23 07/22/2018   NA 138 07/22/2018   K 3.7 07/22/2018   CL 101 07/22/2018   CREATININE 0.99 07/22/2018   BUN 16 07/22/2018   CO2 25 07/22/2018   TSH 3.901 05/13/2018   HGBA1C 5.7 (H) 05/13/2018    IMAGES: CT abd/pelvis 05/13/18: IMPRESSION: 1. Focal area of airspace consolidation in the left lingula may represent pneumonia. 2. Mild wall thickening in the terminal ileum with interloop fluid may indicate enteritis. No evidence of bowel obstruction. 3. Diffuse fatty infiltration of the liver. 4. Bilateral parapelvic renal cysts. Aortic Atherosclerosis (ICD10-I70.0).   EKG: EKG from 05/14/18 shows NSR, but otherwise I believe the tracing is consistent with LA/RA lead reversal. Would recommend a repeat EKG on arrival.    CV:  Nuclear stress test [facility]) IMPRESSION: 1.  No evidence of ischemia on the scan. 2.  Preserved ejection fraction.  EF calculated at 53%. 3.  No significant symptoms, EKGs, or arrhythmias occurred.  Echo 06/22/10 [facility], for new onset afib): Summary: 1.  Preserved left ventricular ejection fraction.  LVEF 55%. 2.  Normal left atrial size. 3.  Mild mitral regurgitation.  Mild tricuspid regurgitation.  Trace pulmonary insufficiency.   Past Medical History:  Diagnosis Date  . Allergic rhinitis   . Anxiety   . Ascites    acute  . Atrial fibrillation ([place])    resolved - does not see cardiologist  . Colon cancer [facility]) 2014   cancer of the sigmoid colon  . Depression   . Diverticulitis    "this is my 2nd time in hospital w/this in the last 2 wk" (06/21/2012)  . Dysrhythmia    1 time issue with A. Fib. In the hospital for 1 week. Spontaneously convereted  to NSR.  Marland Kitchen GERD (gastroesophageal reflux disease)   . Gout   . Hydronephrosis   . Hypertension   . Lower extremity edema   . Migraines   . Osteoarthritis   . Peripheral neuropathy   . PTSD (post-traumatic stress disorder)   . Restless legs syndrome   . S/P chemotherapy, time since less than 4 weeks   . Tremor, essential     Past Surgical History:  Procedure Laterality Date  . APPENDECTOMY    . COLONOSCOPY N/A 08/01/2017   Procedure: COLONOSCOPY;  Surgeon: Lin Landsman, MD;  Location: [facility] ENDOSCOPY;  Service: Gastroenterology;  Laterality: N/A;  . PARTIAL COLECTOMY N/A 06/28/2012   Procedure: PARTIAL COLECTOMY;  Surgeon: Harl Bowie, MD;  Location: [facility];  Service: General;  Laterality: N/A;  . PORTACATH PLACEMENT N/A 07/11/2012   Procedure: INSERTION PORT-A-CATH;  Surgeon: Harl Bowie, MD;  Location: [facility];  Service: General;  Laterality: N/A;  . portacath removal    . TUBAL LIGATION  ~ 1989    MEDICATIONS: . acetaminophen (TYLENOL) 500 MG  tablet  . ALPRAZolam (XANAX) 0.5 MG tablet  . DULoxetine (CYMBALTA) 60 MG capsule  . fluticasone (FLONASE) 50 MCG/ACT nasal spray  . lisinopril (ZESTRIL) 10 MG tablet  . omeprazole (PRILOSEC) 20 MG capsule  . ondansetron (ZOFRAN) 8 MG tablet  . rizatriptan (MAXALT) 10 MG tablet   No current facility-administered medications for this encounter.     Myra Gianotti, PA-C Surgical Short Stay/Anesthesiology [facility] Phone [phone] [facility] Phone [phone] 08/02/2018 3:22 PM

## 2018-08-02 NOTE — Anesthesia Preprocedure Evaluation (Addendum)
Anesthesia Evaluation  Patient identified by MRN, date of birth, ID band Patient awake    Reviewed: Allergy & Precautions, NPO status , Patient's Chart, lab work & pertinent test results  History of Anesthesia Complications Negative for: history of anesthetic complications  Airway Mallampati: II  TM Distance: >3 FB Neck ROM: Full    Dental no notable dental hx.    Pulmonary Current Smoker,    Pulmonary exam normal        Cardiovascular hypertension, Normal cardiovascular exam+ dysrhythmias Atrial Fibrillation      Neuro/Psych PSYCHIATRIC DISORDERS Anxiety Depression negative neurological ROS     GI/Hepatic Neg liver ROS, GERD  ,  Endo/Other  negative endocrine ROS  Renal/GU negative Renal ROS  negative genitourinary   Musculoskeletal negative musculoskeletal ROS (+)   Abdominal   Peds  Hematology negative hematology ROS (+)   Anesthesia Other Findings   Reproductive/Obstetrics                          Anesthesia Physical Anesthesia Plan  ASA: III  Anesthesia Plan: General   Post-op Pain Management:    Induction: Intravenous and Rapid sequence  PONV Risk Score and Plan: 2 and Ondansetron, Dexamethasone, Midazolam and Treatment may vary due to age or medical condition  Airway Management Planned: Oral ETT  Additional Equipment: None  Intra-op Plan:   Post-operative Plan: Extubation in OR  Informed Consent: I have reviewed the patients History and Physical, chart, labs and discussed the procedure including the risks, benefits and alternatives for the proposed anesthesia with the patient or authorized representative who has indicated his/her understanding and acceptance.     Dental advisory given  Plan Discussed with:   Anesthesia Plan Comments: (PAT note written 08/02/2018 by Myra Gianotti, PA-C. )      Anesthesia Quick Evaluation

## 2018-08-03 ENCOUNTER — Ambulatory Visit (HOSPITAL_COMMUNITY): Payer: Commercial Managed Care - PPO | Admitting: Vascular Surgery

## 2018-08-03 ENCOUNTER — Ambulatory Visit (HOSPITAL_COMMUNITY): Payer: Commercial Managed Care - PPO | Admitting: Physician Assistant

## 2018-08-03 ENCOUNTER — Observation Stay (HOSPITAL_COMMUNITY)
Admission: RE | Admit: 2018-08-03 | Discharge: 2018-08-04 | Disposition: A | Discharge status: Home / Self Care | Payer: Commercial Managed Care - PPO | Attending: Surgery | Admitting: Surgery

## 2018-08-03 ENCOUNTER — Encounter (HOSPITAL_COMMUNITY)
Admission: RE | Disposition: A | Discharge status: Home / Self Care | Payer: Self-pay | Source: Home / Self Care | Attending: Surgery

## 2018-08-03 ENCOUNTER — Other Ambulatory Visit: Payer: Self-pay

## 2018-08-03 ENCOUNTER — Encounter (HOSPITAL_COMMUNITY): Payer: Self-pay

## 2018-08-03 DIAGNOSIS — I4891 Unspecified atrial fibrillation: Secondary | ICD-10-CM | POA: Insufficient documentation

## 2018-08-03 DIAGNOSIS — K432 Incisional hernia without obstruction or gangrene: Secondary | ICD-10-CM | POA: Diagnosis not present

## 2018-08-03 DIAGNOSIS — F329 Major depressive disorder, single episode, unspecified: Secondary | ICD-10-CM | POA: Insufficient documentation

## 2018-08-03 DIAGNOSIS — F419 Anxiety disorder, unspecified: Secondary | ICD-10-CM | POA: Insufficient documentation

## 2018-08-03 DIAGNOSIS — Z9049 Acquired absence of other specified parts of digestive tract: Secondary | ICD-10-CM | POA: Insufficient documentation

## 2018-08-03 DIAGNOSIS — Z79899 Other long term (current) drug therapy: Secondary | ICD-10-CM | POA: Insufficient documentation

## 2018-08-03 DIAGNOSIS — Z1159 Encounter for screening for other viral diseases: Secondary | ICD-10-CM | POA: Insufficient documentation

## 2018-08-03 DIAGNOSIS — Z85038 Personal history of other malignant neoplasm of large intestine: Secondary | ICD-10-CM | POA: Insufficient documentation

## 2018-08-03 DIAGNOSIS — J449 Chronic obstructive pulmonary disease, unspecified: Secondary | ICD-10-CM | POA: Diagnosis not present

## 2018-08-03 DIAGNOSIS — Z87891 Personal history of nicotine dependence: Secondary | ICD-10-CM | POA: Insufficient documentation

## 2018-08-03 DIAGNOSIS — K219 Gastro-esophageal reflux disease without esophagitis: Secondary | ICD-10-CM | POA: Insufficient documentation

## 2018-08-03 DIAGNOSIS — I10 Essential (primary) hypertension: Secondary | ICD-10-CM | POA: Insufficient documentation

## 2018-08-03 HISTORY — PX: INCISIONAL HERNIA REPAIR: SHX193

## 2018-08-03 LAB — NOVEL CORONAVIRUS, NAA (HOSP ORDER, SEND-OUT TO REF LAB; TAT 18-24 HRS): SARS-CoV-2, NAA: NOT DETECTED

## 2018-08-03 SURGERY — REPAIR, HERNIA, INCISIONAL, LAPAROSCOPIC
Anesthesia: General | Site: Abdomen

## 2018-08-03 MED ORDER — DIPHENHYDRAMINE HCL 50 MG/ML IJ SOLN
INTRAMUSCULAR | Status: DC | PRN
  Administered 2018-08-03: 25 mg via INTRAVENOUS

## 2018-08-03 MED ORDER — HYDROMORPHONE HCL 1 MG/ML IJ SOLN
INTRAMUSCULAR | Status: AC
  Filled 2018-08-03: qty 1

## 2018-08-03 MED ORDER — LISINOPRIL 10 MG PO TABS: 10.0000 mg | ORAL_TABLET | Freq: Every day | ORAL | Status: DC

## 2018-08-03 MED ORDER — ONDANSETRON HCL 4 MG/2ML IJ SOLN: 4.0000 mg | Freq: Four times a day (QID) | INTRAMUSCULAR | Status: DC | PRN

## 2018-08-03 MED ORDER — ROCURONIUM BROMIDE 10 MG/ML (PF) SYRINGE
PREFILLED_SYRINGE | INTRAVENOUS | Status: DC | PRN
  Administered 2018-08-03: 50 mg via INTRAVENOUS

## 2018-08-03 MED ORDER — CELECOXIB 200 MG PO CAPS
200.0000 mg | ORAL_CAPSULE | Freq: Two times a day (BID) | ORAL | Status: DC
  Administered 2018-08-03 (×2): 200 mg via ORAL
  Filled 2018-08-03 (×2): qty 1

## 2018-08-03 MED ORDER — BUPIVACAINE-EPINEPHRINE (PF) 0.25% -1:200000 IJ SOLN
INTRAMUSCULAR | Status: AC
  Filled 2018-08-03: qty 30

## 2018-08-03 MED ORDER — ALPRAZOLAM 0.5 MG PO TABS
0.5000 mg | ORAL_TABLET | Freq: Three times a day (TID) | ORAL | Status: DC | PRN
  Administered 2018-08-03: 0.5 mg via ORAL
  Filled 2018-08-03: qty 1

## 2018-08-03 MED ORDER — ONDANSETRON HCL 4 MG/2ML IJ SOLN
INTRAMUSCULAR | Status: DC | PRN
  Administered 2018-08-03: 4 mg via INTRAVENOUS

## 2018-08-03 MED ORDER — DEXMEDETOMIDINE HCL 200 MCG/2ML IV SOLN
INTRAVENOUS | Status: DC | PRN
  Administered 2018-08-03: 12 ug via INTRAVENOUS
  Administered 2018-08-03: 16 ug via INTRAVENOUS
  Administered 2018-08-03: 12 ug via INTRAVENOUS

## 2018-08-03 MED ORDER — OXYCODONE HCL 5 MG PO TABS
5.0000 mg | ORAL_TABLET | ORAL | Status: DC | PRN
  Administered 2018-08-03 – 2018-08-04 (×3): 10 mg via ORAL
  Filled 2018-08-03 (×3): qty 2

## 2018-08-03 MED ORDER — DEXAMETHASONE SODIUM PHOSPHATE 10 MG/ML IJ SOLN
INTRAMUSCULAR | Status: DC | PRN
  Administered 2018-08-03: 8 mg via INTRAVENOUS

## 2018-08-03 MED ORDER — SUGAMMADEX SODIUM 200 MG/2ML IV SOLN
INTRAVENOUS | Status: DC | PRN
  Administered 2018-08-03: 200 mg via INTRAVENOUS

## 2018-08-03 MED ORDER — OXYCODONE HCL 5 MG PO TABS
ORAL_TABLET | ORAL | Status: AC
  Filled 2018-08-03: qty 1

## 2018-08-03 MED ORDER — FENTANYL CITRATE (PF) 250 MCG/5ML IJ SOLN
INTRAMUSCULAR | Status: AC
  Filled 2018-08-03: qty 5

## 2018-08-03 MED ORDER — FLUTICASONE PROPIONATE 50 MCG/ACT NA SUSP
1.0000 | Freq: Every day | NASAL | Status: DC | PRN
  Filled 2018-08-03: qty 16

## 2018-08-03 MED ORDER — GABAPENTIN 300 MG PO CAPS: 300.0000 mg | ORAL_CAPSULE | ORAL | Status: DC

## 2018-08-03 MED ORDER — OXYCODONE HCL 5 MG/5ML PO SOLN: 5.0000 mg | Freq: Once | ORAL | Status: AC | PRN

## 2018-08-03 MED ORDER — METHOCARBAMOL 500 MG PO TABS
500.0000 mg | ORAL_TABLET | Freq: Four times a day (QID) | ORAL | Status: DC | PRN
  Administered 2018-08-03 (×2): 500 mg via ORAL
  Filled 2018-08-03 (×2): qty 1

## 2018-08-03 MED ORDER — OXYCODONE HCL 5 MG PO TABS
5.0000 mg | ORAL_TABLET | Freq: Once | ORAL | Status: AC | PRN
  Administered 2018-08-03: 5 mg via ORAL

## 2018-08-03 MED ORDER — FENTANYL CITRATE (PF) 100 MCG/2ML IJ SOLN
25.0000 ug | INTRAMUSCULAR | Status: DC | PRN
  Administered 2018-08-03: 50 ug via INTRAVENOUS
  Administered 2018-08-03 (×2): 25 ug via INTRAVENOUS

## 2018-08-03 MED ORDER — ACETAMINOPHEN 500 MG PO TABS
ORAL_TABLET | ORAL | Status: AC
  Administered 2018-08-03: 10:00:00 1000 mg via ORAL
  Filled 2018-08-03: qty 2

## 2018-08-03 MED ORDER — GABAPENTIN 300 MG PO CAPS
ORAL_CAPSULE | ORAL | Status: AC
  Filled 2018-08-03: qty 1

## 2018-08-03 MED ORDER — FENTANYL CITRATE (PF) 100 MCG/2ML IJ SOLN
INTRAMUSCULAR | Status: AC
  Filled 2018-08-03: qty 2

## 2018-08-03 MED ORDER — LABETALOL HCL 5 MG/ML IV SOLN
5.0000 mg | Freq: Once | INTRAVENOUS | Status: AC
  Administered 2018-08-03: 5 mg via INTRAVENOUS
  Filled 2018-08-03: qty 4

## 2018-08-03 MED ORDER — METHOCARBAMOL 500 MG PO TABS
ORAL_TABLET | ORAL | Status: AC
  Filled 2018-08-03: qty 1

## 2018-08-03 MED ORDER — POTASSIUM CHLORIDE IN NACL 20-0.9 MEQ/L-% IV SOLN
INTRAVENOUS | Status: DC
  Administered 2018-08-03 – 2018-08-04 (×2): via INTRAVENOUS
  Filled 2018-08-03 (×2): qty 1000

## 2018-08-03 MED ORDER — SUMATRIPTAN SUCCINATE 50 MG PO TABS
50.0000 mg | ORAL_TABLET | ORAL | Status: DC | PRN
  Filled 2018-08-03: qty 1

## 2018-08-03 MED ORDER — DIPHENHYDRAMINE HCL 25 MG PO CAPS: 25.0000 mg | ORAL_CAPSULE | Freq: Four times a day (QID) | ORAL | Status: DC | PRN

## 2018-08-03 MED ORDER — ONDANSETRON 4 MG PO TBDP: 4.0000 mg | ORAL_TABLET | Freq: Four times a day (QID) | ORAL | Status: DC | PRN

## 2018-08-03 MED ORDER — LACTATED RINGERS IV SOLN
INTRAVENOUS | Status: DC
  Administered 2018-08-03 (×2): via INTRAVENOUS

## 2018-08-03 MED ORDER — CHLORHEXIDINE GLUCONATE CLOTH 2 % EX PADS: 6.0000 | MEDICATED_PAD | Freq: Once | CUTANEOUS | Status: DC

## 2018-08-03 MED ORDER — CEFAZOLIN SODIUM-DEXTROSE 2-4 GM/100ML-% IV SOLN
INTRAVENOUS | Status: AC
  Filled 2018-08-03: qty 100

## 2018-08-03 MED ORDER — MIDAZOLAM HCL 2 MG/2ML IJ SOLN
INTRAMUSCULAR | Status: AC
  Filled 2018-08-03: qty 2

## 2018-08-03 MED ORDER — LIDOCAINE 2% (20 MG/ML) 5 ML SYRINGE
INTRAMUSCULAR | Status: DC | PRN
  Administered 2018-08-03: 80 mg via INTRAVENOUS

## 2018-08-03 MED ORDER — SODIUM CHLORIDE 0.9 % IR SOLN
Status: DC | PRN
  Administered 2018-08-03: 1000 mL

## 2018-08-03 MED ORDER — CEFAZOLIN SODIUM-DEXTROSE 2-4 GM/100ML-% IV SOLN
2.0000 g | INTRAVENOUS | Status: AC
  Administered 2018-08-03: 2 g via INTRAVENOUS

## 2018-08-03 MED ORDER — ENOXAPARIN SODIUM 40 MG/0.4ML ~~LOC~~ SOLN: 40.0000 mg | SUBCUTANEOUS | Status: DC

## 2018-08-03 MED ORDER — CELECOXIB 200 MG PO CAPS
ORAL_CAPSULE | ORAL | Status: AC
  Administered 2018-08-03: 200 mg via ORAL
  Filled 2018-08-03: qty 1

## 2018-08-03 MED ORDER — PROPOFOL 10 MG/ML IV BOLUS
INTRAVENOUS | Status: DC | PRN
  Administered 2018-08-03: 200 mg via INTRAVENOUS

## 2018-08-03 MED ORDER — PANTOPRAZOLE SODIUM 40 MG PO TBEC: 40.0000 mg | DELAYED_RELEASE_TABLET | Freq: Every day | ORAL | Status: DC

## 2018-08-03 MED ORDER — CELECOXIB 200 MG PO CAPS
200.0000 mg | ORAL_CAPSULE | ORAL | Status: AC
  Administered 2018-08-03: 200 mg via ORAL

## 2018-08-03 MED ORDER — DULOXETINE HCL 60 MG PO CPEP: 60.0000 mg | ORAL_CAPSULE | Freq: Every day | ORAL | Status: DC

## 2018-08-03 MED ORDER — LABETALOL HCL 5 MG/ML IV SOLN
INTRAVENOUS | Status: AC
  Administered 2018-08-03: 5 mg via INTRAVENOUS
  Filled 2018-08-03: qty 4

## 2018-08-03 MED ORDER — TRAMADOL HCL 50 MG PO TABS: 50.0000 mg | ORAL_TABLET | Freq: Four times a day (QID) | ORAL | Status: DC | PRN

## 2018-08-03 MED ORDER — ONDANSETRON HCL 4 MG/2ML IJ SOLN: 4.0000 mg | Freq: Once | INTRAMUSCULAR | Status: DC | PRN

## 2018-08-03 MED ORDER — HYDROMORPHONE HCL 1 MG/ML IJ SOLN
1.0000 mg | INTRAMUSCULAR | Status: DC | PRN
  Administered 2018-08-03 – 2018-08-04 (×4): 1 mg via INTRAVENOUS
  Filled 2018-08-03 (×4): qty 1

## 2018-08-03 MED ORDER — FENTANYL CITRATE (PF) 250 MCG/5ML IJ SOLN
INTRAMUSCULAR | Status: DC | PRN
  Administered 2018-08-03 (×2): 100 ug via INTRAVENOUS
  Administered 2018-08-03 (×3): 50 ug via INTRAVENOUS
  Administered 2018-08-03: 100 ug via INTRAVENOUS

## 2018-08-03 MED ORDER — SUCCINYLCHOLINE CHLORIDE 20 MG/ML IJ SOLN
INTRAMUSCULAR | Status: DC | PRN
  Administered 2018-08-03: 100 mg via INTRAVENOUS

## 2018-08-03 MED ORDER — ACETAMINOPHEN 500 MG PO TABS
1000.0000 mg | ORAL_TABLET | ORAL | Status: AC
  Administered 2018-08-03: 1000 mg via ORAL

## 2018-08-03 MED ORDER — HYDROMORPHONE HCL 1 MG/ML IJ SOLN
0.2500 mg | INTRAMUSCULAR | Status: DC | PRN
  Administered 2018-08-03 (×2): 0.5 mg via INTRAVENOUS
  Administered 2018-08-03 (×2): 0.25 mg via INTRAVENOUS

## 2018-08-03 MED ORDER — BUPIVACAINE-EPINEPHRINE 0.25% -1:200000 IJ SOLN
INTRAMUSCULAR | Status: DC | PRN
  Administered 2018-08-03: 20 mL

## 2018-08-03 MED ORDER — DIPHENHYDRAMINE HCL 50 MG/ML IJ SOLN: 25.0000 mg | Freq: Four times a day (QID) | INTRAMUSCULAR | Status: DC | PRN

## 2018-08-03 SURGICAL SUPPLY — 47 items
ADH SKN CLS APL DERMABOND .7 (GAUZE/BANDAGES/DRESSINGS) ×1
APPLIER CLIP 5 13 M/L LIGAMAX5 (MISCELLANEOUS)
APPLIER CLIP ROT 10 11.4 M/L (STAPLE)
APR CLP MED LRG 11.4X10 (STAPLE)
APR CLP MED LRG 5 ANG JAW (MISCELLANEOUS)
BLADE CLIPPER SURG (BLADE) IMPLANT
CANISTER SUCT 3000ML PPV (MISCELLANEOUS) IMPLANT
CHLORAPREP W/TINT 26ML (MISCELLANEOUS) ×3 IMPLANT
CLIP APPLIE 5 13 M/L LIGAMAX5 (MISCELLANEOUS) IMPLANT
CLIP APPLIE ROT 10 11.4 M/L (STAPLE) IMPLANT
COVER SURGICAL LIGHT HANDLE (MISCELLANEOUS) ×3 IMPLANT
COVER WAND RF STERILE (DRAPES) ×1 IMPLANT
DECANTER SPIKE VIAL GLASS SM (MISCELLANEOUS) ×3 IMPLANT
DERMABOND ADVANCED (GAUZE/BANDAGES/DRESSINGS) ×2
DERMABOND ADVANCED .7 DNX12 (GAUZE/BANDAGES/DRESSINGS) ×1 IMPLANT
DEVICE SECURE STRAP 25 ABSORB (INSTRUMENTS) ×3 IMPLANT
DEVICE TROCAR PUNCTURE CLOSURE (ENDOMECHANICALS) ×3 IMPLANT
DRAPE LAPAROSCOPIC ABDOMINAL (DRAPES) ×3 IMPLANT
ELECT REM PT RETURN 9FT ADLT (ELECTROSURGICAL) ×3
ELECTRODE REM PT RTRN 9FT ADLT (ELECTROSURGICAL) ×1 IMPLANT
GLOVE SURG SIGNA 7.5 PF LTX (GLOVE) ×3 IMPLANT
GOWN STRL REUS W/ TWL LRG LVL3 (GOWN DISPOSABLE) ×2 IMPLANT
GOWN STRL REUS W/ TWL XL LVL3 (GOWN DISPOSABLE) ×1 IMPLANT
GOWN STRL REUS W/TWL LRG LVL3 (GOWN DISPOSABLE) ×6
GOWN STRL REUS W/TWL XL LVL3 (GOWN DISPOSABLE) ×3
KIT BASIN OR (CUSTOM PROCEDURE TRAY) ×3 IMPLANT
KIT TURNOVER KIT B (KITS) ×3 IMPLANT
MARKER SKIN DUAL TIP RULER LAB (MISCELLANEOUS) ×3 IMPLANT
MESH VENTRALIGHT ST 4.5IN (Mesh General) ×2 IMPLANT
NDL SPNL 22GX3.5 QUINCKE BK (NEEDLE) ×1 IMPLANT
NEEDLE SPNL 22GX3.5 QUINCKE BK (NEEDLE) ×3 IMPLANT
NS IRRIG 1000ML POUR BTL (IV SOLUTION) ×3 IMPLANT
PAD ARMBOARD 7.5X6 YLW CONV (MISCELLANEOUS) ×6 IMPLANT
SCISSORS LAP 5X35 DISP (ENDOMECHANICALS) ×2 IMPLANT
SET IRRIG TUBING LAPAROSCOPIC (IRRIGATION / IRRIGATOR) IMPLANT
SET TUBE SMOKE EVAC HIGH FLOW (TUBING) ×1 IMPLANT
SHEARS HARMONIC ACE PLUS 36CM (ENDOMECHANICALS) ×2 IMPLANT
SLEEVE ENDOPATH XCEL 5M (ENDOMECHANICALS) ×5 IMPLANT
SUT MON AB 4-0 PC3 18 (SUTURE) ×3 IMPLANT
SUT NOVA NAB GS-21 0 18 T12 DT (SUTURE) ×3 IMPLANT
TOWEL OR 17X24 6PK STRL BLUE (TOWEL DISPOSABLE) ×3 IMPLANT
TOWEL OR 17X26 10 PK STRL BLUE (TOWEL DISPOSABLE) ×3 IMPLANT
TRAY FOLEY CATH SILVER 16FR (SET/KITS/TRAYS/PACK) IMPLANT
TRAY LAPAROSCOPIC MC (CUSTOM PROCEDURE TRAY) ×3 IMPLANT
TROCAR XCEL NON-BLD 11X100MML (ENDOMECHANICALS) ×3 IMPLANT
TROCAR XCEL NON-BLD 5MMX100MML (ENDOMECHANICALS) ×3 IMPLANT
WATER STERILE IRR 1000ML POUR (IV SOLUTION) ×1 IMPLANT

## 2018-08-03 NOTE — Op Note (Signed)
LAPAROSCOPIC POSSIBLE OPEN INCISIONAL HERNIA REPAIR WITH MESH  Procedure Note  Sheri Calderon 08/03/2018   Pre-op Diagnosis: INCISIONAL HERNIA     Post-op Diagnosis: same  Procedure(s): LAPAROSCOPIC POSSIBLE OPEN INCISIONAL HERNIA REPAIR WITH MESH (11 cm round ventralight ST)  Surgeon(s): Coralie Keens, MD Carlena Hurl, PA-C  Anesthesia: General  Staff:  Circulator: Gar Ponto, RN Relief Circulator: Harrel Lemon, RN Relief Scrub: Ronney Lion, RN Scrub Person: Lovett Sox, CST; Teschner, Mindy K, Immunologist: Rosanne Sack, RN  Estimated Blood Loss: Minimal               Findings: The patient was found to have a small incisional hernia with a fascial defect right at the umbilicus.  The hernia contained only omentum.  There was no gross evidence of recurrent cancer.  Procedure: The patient was brought to the operating room and identifies correct patient.  She was placed upon the operating table and general anesthesia was induced.  Her abdomen was then prepped and draped in usual sterile fashion.  I made a small incision the patient's left upper quadrant with a scalpel.  I then used a 5 mm trocar and Optiview camera to slowly traversed all levels of the abdominal wall and gain entrance into the peritoneal cavity.  I evaluated the insertion site and saw no evidence of bowel injury.  After insufflation of the abdomen, 12 mm port and a 5 mm port was placed the patient's left abdomen and another 5 mm port was placed the patient's right upper quadrant all under direct vision. The patient was found to have a small incisional hernia defect containing only omentum.  There was also omentum stuck to the lower midline of the abdominal wall as well.  I was able to take down the omentum both bluntly and with the harmonic scalpel.  There was no bowel involved in the hernia.  We then evaluated the pelvis and liver and saw no evidence of metastatic disease.  We  measured the defect which was quite small.  I brought a 11 cm round ventral light ST Prolene patch from [facility] onto the field.  4 separate #1 Novafil sutures were placed in the mesh.  I then rolled the mesh up and soaked in saline.  I then placed it through the large trocar.  I then unrolled the mesh.  I then made 4 separate stab incisions and use a suture passer to pull up the sutures circumferentially around the fascial defect.  I then secured these in place pulling the mesh up against the abdominal wall.  Wide coverage of the fascial defect appeared to be achieved circumferentially.  I then tacked the rest of the mesh in place with absorbable tacks.  At this point, hemostasis appeared to be achieved.  The abdomen was deflated and all trochars were removed.  All incisions were anesthetized with Marcaine and closed with 4-0 Monocryl sutures and Dermabond.  The patient tolerated the procedure well.  All the counts were correct at the end of the procedure.  The patient was then extubated in the operating room and taken in stable addition to the recovery room.          Coralie Keens   Date: 08/03/2018  Time: 11:35 AM

## 2018-08-03 NOTE — Progress Notes (Signed)
Dr. Christella Hartigan notified patient's BP elevated.  Patient took lisinopril PTA.  Received order for 7m Labetalol IV.  Will continue to monitor patient.

## 2018-08-03 NOTE — Anesthesia Postprocedure Evaluation (Signed)
Anesthesia Post Note  Patient: Sheri Calderon  Procedure(s) Performed: LAPAROSCOPIC INCISIONAL HERNIA REPAIR WITH MESH (N/A Abdomen)     Patient location during evaluation: PACU Anesthesia Type: General Level of consciousness: awake and alert Pain management: pain level controlled Vital Signs Assessment: post-procedure vital signs reviewed and stable Respiratory status: spontaneous breathing, nonlabored ventilation, respiratory function stable and patient connected to nasal cannula oxygen Cardiovascular status: blood pressure returned to baseline and stable Postop Assessment: no apparent nausea or vomiting Anesthetic complications: no    Last Vitals:  Vitals:   08/03/18 1351 08/03/18 1426  BP: (!) 148/94 (!) 141/88  Pulse: 79 84  Resp: 14 18  Temp: (!) 36.1 C 36.4 C  SpO2: 96% 97%    Last Pain:  Vitals:   08/03/18 1426  TempSrc: Oral  PainSc:                  Lidia Collum

## 2018-08-03 NOTE — Transfer of Care (Signed)
Immediate Anesthesia Transfer of Care Note  Patient: Sheri Calderon  Procedure(s) Performed: LAPAROSCOPIC INCISIONAL HERNIA REPAIR WITH MESH (N/A Abdomen)  Patient Location: PACU  Anesthesia Type:General  Level of Consciousness: awake, alert  and oriented  Airway & Oxygen Therapy: Patient Spontanous Breathing and Patient connected to face mask oxygen  Post-op Assessment: Report given to RN and Post -op Vital signs reviewed and stable  Post vital signs: Reviewed and stable  Last Vitals:  Vitals Value Taken Time  BP 193/121 08/03/2018 11:52 AM  Temp    Pulse 99 08/03/2018 11:54 AM  Resp 18 08/03/2018 11:54 AM  SpO2 98 % 08/03/2018 11:54 AM  Vitals shown include unvalidated device data.  Last Pain:  Vitals:   08/03/18 0940  PainSc: 10-Worst pain ever      Patients Stated Pain Goal: 3 (06/34/94 9447)  Complications: No apparent anesthesia complications

## 2018-08-03 NOTE — Anesthesia Procedure Notes (Signed)
Procedure Name: Intubation Date/Time: 08/03/2018 10:45 AM Performed by: Mariea Clonts, CRNA Pre-anesthesia Checklist: Patient identified, Emergency Drugs available, Suction available and Patient being monitored Patient Re-evaluated:Patient Re-evaluated prior to induction Oxygen Delivery Method: Circle System Utilized Preoxygenation: Pre-oxygenation with 100% oxygen Induction Type: IV induction and Cricoid Pressure applied Ventilation: Mask ventilation without difficulty and Oral airway inserted - appropriate to patient size Laryngoscope Size: Sabra Heck and 2 Grade View: Grade II Tube type: Oral Tube size: 7.5 mm Number of attempts: 1 Airway Equipment and Method: Stylet and Oral airway Placement Confirmation: ETT inserted through vocal cords under direct vision,  positive ETCO2 and breath sounds checked- equal and bilateral Tube secured with: Tape Dental Injury: Teeth and Oropharynx as per pre-operative assessment

## 2018-08-03 NOTE — Interval H&P Note (Signed)
History and Physical Interval Note:no change in H and P  08/03/2018 9:09 AM  Sheri Calderon  has presented today for surgery, with the diagnosis of INCISIONAL HERNIA.  The various methods of treatment have been discussed with the patient and family. After consideration of risks, benefits and other options for treatment, the patient has consented to  Procedure(s): [place] (N/A) as a surgical intervention.  The patient's history has been reviewed, patient examined, no change in status, stable for surgery.  I have reviewed the patient's chart and labs.  Questions were answered to the patient's satisfaction.     Coralie Keens

## 2018-08-04 ENCOUNTER — Encounter (HOSPITAL_COMMUNITY): Payer: Self-pay | Admitting: Surgery

## 2018-08-04 DIAGNOSIS — K432 Incisional hernia without obstruction or gangrene: Secondary | ICD-10-CM | POA: Diagnosis not present

## 2018-08-04 LAB — CBC
HCT: 33.5 % — ABNORMAL LOW (ref 36.0–46.0)
Hemoglobin: 10.8 g/dL — ABNORMAL LOW (ref 12.0–15.0)
MCH: 27.9 pg (ref 26.0–34.0)
MCHC: 32.2 g/dL (ref 30.0–36.0)
MCV: 86.6 fL (ref 80.0–100.0)
Platelets: 277 10*3/uL (ref 150–400)
RBC: 3.87 MIL/uL (ref 3.87–5.11)
RDW: 13.1 % (ref 11.5–15.5)
WBC: 7.6 10*3/uL (ref 4.0–10.5)
nRBC: 0 % (ref 0.0–0.2)

## 2018-08-04 MED ORDER — OXYCODONE HCL 5 MG PO TABS: 5.0000 mg | ORAL_TABLET | Freq: Four times a day (QID) | ORAL | 0 refills | Status: DC | PRN

## 2018-08-04 NOTE — Progress Notes (Signed)
Patient ID: Sheri Calderon, female   DOB: [birth year], 54 y.o.   MRN: [id number]   POD#1 She reports feeling better and that her pain is controlled and she wants to go home Abdomen is soft Incisions clean She looks comfortable  Plan: Discharge home

## 2018-08-04 NOTE — Progress Notes (Signed)
Arrie Eastern to be D/C'd  per MD order. Discussed with the patient and all questions fully answered.  VSS, Skin clean, dry and intact without evidence of skin break down, no evidence of skin tears noted.  IV catheter discontinued intact. Site without signs and symptoms of complications. Dressing and pressure applied.  An After Visit Summary was printed and given to the patient. Patient received prescription.  D/c education completed with patient/family including follow up instructions, medication list, d/c activities limitations if indicated, with other d/c instructions as indicated by MD - patient able to verbalize understanding, all questions fully answered.   Patient instructed to return to ED, call 911, or call MD for any changes in condition.   Patient to be escorted via [place], and D/C home via private auto.

## 2018-08-04 NOTE — Discharge Summary (Signed)
Physician Discharge Summary  Patient ID: Sheri Calderon MRN: [id number] DOB/AGE: [birth year] 53 y.o.  Admit date: 08/03/2018 Discharge date: 08/04/2018  Admission Diagnoses:  Discharge Diagnoses:  Active Problems:   Incisional hernia   Discharged Condition: good  Hospital Course: uneventful post op recover.  Tolerated surgery well.  Discharged home POD#1 doing well  Consults: None  Significant Diagnostic Studies:   Treatments: surgery: laparoscopic incisional hernia repair with mesh  Discharge Exam: Blood pressure (!) 152/89, pulse 71, temperature 98.4 F (36.9 C), temperature source Oral, resp. rate 18, height 5' 7"  (1.702 m), weight 94.8 kg, last menstrual period 10/12/2012, SpO2 94 %. General appearance: alert, cooperative and no distress Resp: clear to auscultation bilaterally Cardio: regular rate and rhythm, S1, S2 normal, no murmur, click, rub or gallop Incision/Wound:abdomen soft, incisions clean  Disposition: Discharge disposition: 01-Home or Self Care        Allergies as of 08/04/2018      Reactions   Lorazepam Hives, Swelling   Patient reports receiving lorazepam intensol in the hospital and experienced swelling with hives.   Gabapentin Rash      Medication List    TAKE these medications   acetaminophen 500 MG tablet Commonly known as:  TYLENOL Take 2,000-3,000 mg by mouth 2 (two) times daily as needed for moderate pain or headache.   ALPRAZolam 0.5 MG tablet Commonly known as:  XANAX Take 0.5 mg by mouth 3 (three) times daily as needed for anxiety.   Cymbalta 60 MG capsule Generic drug:  DULoxetine Take 60 mg by mouth daily.   fluticasone 50 MCG/ACT nasal spray Commonly known as:  FLONASE Place 1 spray into both nostrils daily as needed for allergies or rhinitis.   lisinopril 10 MG tablet Commonly known as:  ZESTRIL Take 10 mg by mouth daily.   omeprazole 20 MG capsule Commonly known as:  PRILOSEC Take 20 mg by mouth daily.   ondansetron 8  MG tablet Commonly known as:  ZOFRAN Take 8 mg by mouth every 8 (eight) hours as needed for nausea or vomiting.   oxyCODONE 5 MG immediate release tablet Commonly known as:  Oxy IR/ROXICODONE Take 1 tablet (5 mg total) by mouth every 6 (six) hours as needed for moderate pain or severe pain.   rizatriptan 10 MG tablet Commonly known as:  MAXALT Take 10 mg by mouth See admin instructions. Take 10 mg by mouth at onset of headache. Repeat in 2 hours if needed. Maximum 2 tablets in 24 hours.      Follow-up Information    Coralie Keens, MD. Schedule an appointment as soon as possible for a visit in 4 week(s).   Specialty:  General Surgery Contact information: [address] [place] [address] [phone]           Signed: Coralie Keens 08/04/2018, 8:00 AM

## 2018-08-04 NOTE — Discharge Instructions (Signed)
[facility] _______[facility]  UMBILICAL OR INGUINAL HERNIA REPAIR: POST OP INSTRUCTIONS  Always review your discharge instruction sheet given to you by the facility where your surgery was performed. IF YOU HAVE DISABILITY OR FAMILY LEAVE FORMS, YOU MUST BRING THEM TO THE OFFICE FOR PROCESSING.   DO NOT GIVE THEM TO YOUR DOCTOR.  1. A  prescription for pain medication may be given to you upon discharge.  Take your pain medication as prescribed, if needed.  If narcotic pain medicine is not needed, then you may take acetaminophen (Tylenol) or ibuprofen (Advil) as needed. 2. Take your usually prescribed medications unless otherwise directed. If you need a refill on your pain medication, please contact your pharmacy.  They will contact our office to request authorization. Prescriptions will not be filled after 5 pm or on week-ends. 3. You should follow a light diet the first 24 hours after arrival home, such as soup and crackers, etc.  Be sure to include lots of fluids daily.  Resume your normal diet the day after surgery. 4.Most patients will experience some swelling and bruising around the umbilicus or in the groin and scrotum.  Ice packs and reclining will help.  Swelling and bruising can take several days to resolve.  6. It is common to experience some constipation if taking pain medication after surgery.  Increasing fluid intake and taking a stool softener (such as Colace) will usually help or prevent this problem from occurring.  A mild laxative (Milk of Magnesia or Miralax) should be taken according to package directions if there are no bowel movements after 48 hours. 7. Unless discharge instructions indicate otherwise, you may remove your bandages 24-48 hours after surgery, and you may shower at that time.  You may have steri-strips (small skin tapes) in place directly over the incision.  These strips should be left on the skin for 7-10 days.  If your surgeon used skin glue on the  incision, you may shower in 24 hours.  The glue will flake off over the next 2-3 weeks.  Any sutures or staples will be removed at the office during your follow-up visit. 8. ACTIVITIES:  You may resume regular (light) daily activities beginning the next day--such as daily self-care, walking, climbing stairs--gradually increasing activities as tolerated.  You may have sexual intercourse when it is comfortable.  Refrain from any heavy lifting or straining until approved by your doctor.  a.You may drive when you are no longer taking prescription pain medication, you can comfortably wear a seatbelt, and you can safely maneuver your car and apply brakes. b.RETURN TO WORK:   _____________________________________________  9.You should see your doctor in the office for a follow-up appointment approximately 2-3 weeks after your surgery.  Make sure that you call for this appointment within a day or two after you arrive home to insure a convenient appointment time. 10.OTHER INSTRUCTIONS: __OK TO SHOWER STARTING TODAY ICE PACK, TYLENOL, AND IBUPROFEN ALSO FOR PAIN NO LIFTING MORE THAN 15 POUNDS FOR 4 WEEKS_______________________    _____________________________________  WHEN TO CALL YOUR DOCTOR: 1. Fever over 101.0 2. Inability to urinate 3. Nausea and/or vomiting 4. Extreme swelling or bruising 5. Continued bleeding from incision. 6. Increased pain, redness, or drainage from the incision  The clinic staff is available to answer your questions during regular business hours.  Please dont hesitate to call and ask to speak to one of the nurses for clinical concerns.  If you have a medical emergency, go to the nearest  emergency room or call 911.  A surgeon from [facility] is always on call at the hospital   [address] [place], [place], [place]  [address] ?  [address], [place], [place]   [address] [phone] ? [phone] ? FAX [phone] Web site:  [email or web address]

## 2018-08-09 ENCOUNTER — Other Ambulatory Visit: Payer: Self-pay | Admitting: Student

## 2018-08-09 ENCOUNTER — Ambulatory Visit
Admission: RE | Admit: 2018-08-09 | Discharge: 2018-08-09 | Disposition: A | Discharge status: Home / Self Care | Payer: Medicare Other | Source: Ambulatory Visit | Attending: Student | Admitting: Student

## 2018-08-09 ENCOUNTER — Other Ambulatory Visit: Payer: Self-pay

## 2018-08-09 DIAGNOSIS — R0602 Shortness of breath: Secondary | ICD-10-CM

## 2018-08-09 MED ORDER — IOPAMIDOL (ISOVUE-370) INJECTION 76%
75.0000 mL | Freq: Once | INTRAVENOUS | Status: AC | PRN
  Administered 2018-08-09: 75 mL via INTRAVENOUS

## 2018-11-07 ENCOUNTER — Encounter: Payer: Self-pay | Admitting: Emergency Medicine

## 2018-11-07 ENCOUNTER — Other Ambulatory Visit: Payer: Self-pay

## 2018-11-07 ENCOUNTER — Inpatient Hospital Stay
Admission: EM | Admit: 2018-11-07 | Discharge: 2018-11-09 | DRG: 872 | Disposition: A | Discharge status: Home / Self Care | Payer: Commercial Managed Care - PPO | Attending: Internal Medicine | Admitting: Internal Medicine

## 2018-11-07 ENCOUNTER — Emergency Department: Payer: Commercial Managed Care - PPO

## 2018-11-07 DIAGNOSIS — K219 Gastro-esophageal reflux disease without esophagitis: Secondary | ICD-10-CM | POA: Diagnosis present

## 2018-11-07 DIAGNOSIS — G2581 Restless legs syndrome: Secondary | ICD-10-CM | POA: Diagnosis present

## 2018-11-07 DIAGNOSIS — Z9221 Personal history of antineoplastic chemotherapy: Secondary | ICD-10-CM

## 2018-11-07 DIAGNOSIS — R109 Unspecified abdominal pain: Secondary | ICD-10-CM | POA: Diagnosis present

## 2018-11-07 DIAGNOSIS — N10 Acute tubulo-interstitial nephritis: Secondary | ICD-10-CM | POA: Diagnosis present

## 2018-11-07 DIAGNOSIS — F419 Anxiety disorder, unspecified: Secondary | ICD-10-CM | POA: Diagnosis present

## 2018-11-07 DIAGNOSIS — F329 Major depressive disorder, single episode, unspecified: Secondary | ICD-10-CM | POA: Diagnosis present

## 2018-11-07 DIAGNOSIS — Z923 Personal history of irradiation: Secondary | ICD-10-CM | POA: Diagnosis not present

## 2018-11-07 DIAGNOSIS — I959 Hypotension, unspecified: Secondary | ICD-10-CM | POA: Diagnosis present

## 2018-11-07 DIAGNOSIS — Z20828 Contact with and (suspected) exposure to other viral communicable diseases: Secondary | ICD-10-CM | POA: Diagnosis present

## 2018-11-07 DIAGNOSIS — G250 Essential tremor: Secondary | ICD-10-CM | POA: Diagnosis present

## 2018-11-07 DIAGNOSIS — Z9049 Acquired absence of other specified parts of digestive tract: Secondary | ICD-10-CM

## 2018-11-07 DIAGNOSIS — I10 Essential (primary) hypertension: Secondary | ICD-10-CM | POA: Diagnosis present

## 2018-11-07 DIAGNOSIS — Z79899 Other long term (current) drug therapy: Secondary | ICD-10-CM

## 2018-11-07 DIAGNOSIS — Z85038 Personal history of other malignant neoplasm of large intestine: Secondary | ICD-10-CM

## 2018-11-07 DIAGNOSIS — G629 Polyneuropathy, unspecified: Secondary | ICD-10-CM | POA: Diagnosis present

## 2018-11-07 DIAGNOSIS — F4310 Post-traumatic stress disorder, unspecified: Secondary | ICD-10-CM | POA: Diagnosis present

## 2018-11-07 DIAGNOSIS — A419 Sepsis, unspecified organism: Principal | ICD-10-CM | POA: Diagnosis present

## 2018-11-07 DIAGNOSIS — Z800 Family history of malignant neoplasm of digestive organs: Secondary | ICD-10-CM

## 2018-11-07 DIAGNOSIS — N12 Tubulo-interstitial nephritis, not specified as acute or chronic: Secondary | ICD-10-CM

## 2018-11-07 DIAGNOSIS — F17210 Nicotine dependence, cigarettes, uncomplicated: Secondary | ICD-10-CM | POA: Diagnosis present

## 2018-11-07 LAB — CBC
HCT: 42.2 % (ref 36.0–46.0)
Hemoglobin: 13.6 g/dL (ref 12.0–15.0)
MCH: 27.8 pg (ref 26.0–34.0)
MCHC: 32.2 g/dL (ref 30.0–36.0)
MCV: 86.3 fL (ref 80.0–100.0)
Platelets: 410 10*3/uL — ABNORMAL HIGH (ref 150–400)
RBC: 4.89 MIL/uL (ref 3.87–5.11)
RDW: 13.6 % (ref 11.5–15.5)
WBC: 14.1 10*3/uL — ABNORMAL HIGH (ref 4.0–10.5)
nRBC: 0 % (ref 0.0–0.2)

## 2018-11-07 LAB — BASIC METABOLIC PANEL
Anion gap: 12 (ref 5–15)
BUN: 12 mg/dL (ref 6–20)
CO2: 26 mmol/L (ref 22–32)
Calcium: 9.6 mg/dL (ref 8.9–10.3)
Chloride: 100 mmol/L (ref 98–111)
Creatinine, Ser: 0.69 mg/dL (ref 0.44–1.00)
GFR calc Af Amer: >60 mL/min (ref >60)
GFR calc non Af Amer: >60 mL/min (ref >60)
Glucose, Bld: 111 mg/dL — ABNORMAL HIGH (ref 70–99)
Potassium: 3.9 mmol/L (ref 3.5–5.1)
Sodium: 138 mmol/L (ref 135–145)

## 2018-11-07 LAB — URINALYSIS, COMPLETE (UACMP) WITH MICROSCOPIC
Bacteria, UA: NONE SEEN
Bilirubin Urine: NEGATIVE
Glucose, UA: NEGATIVE mg/dL
Ketones, ur: NEGATIVE mg/dL
Nitrite: POSITIVE — AB
Protein, ur: 100 mg/dL — AB
RBC / HPF: >50 RBC/hpf — ABNORMAL HIGH (ref 0–5)
Specific Gravity, Urine: 1.014 (ref 1.005–1.030)
WBC, UA: >50 WBC/hpf — ABNORMAL HIGH (ref 0–5)
pH: 6 (ref 5.0–8.0)

## 2018-11-07 LAB — LIPASE, BLOOD: Lipase: 22 U/L (ref 11–51)

## 2018-11-07 LAB — HEPATIC FUNCTION PANEL
ALT: 30 U/L (ref 0–44)
AST: 25 U/L (ref 15–41)
Albumin: 4.3 g/dL (ref 3.5–5.0)
Alkaline Phosphatase: 65 U/L (ref 38–126)
Bilirubin, Direct: 0.1 mg/dL (ref 0.0–0.2)
Indirect Bilirubin: 0.8 mg/dL (ref 0.3–0.9)
Total Bilirubin: 0.9 mg/dL (ref 0.3–1.2)
Total Protein: 7.9 g/dL (ref 6.5–8.1)

## 2018-11-07 LAB — LACTIC ACID, PLASMA: Lactic Acid, Venous: 1.1 mmol/L (ref 0.5–1.9)

## 2018-11-07 LAB — SARS CORONAVIRUS 2 BY RT PCR (HOSPITAL ORDER, PERFORMED IN ~~LOC~~ HOSPITAL LAB): SARS Coronavirus 2: NEGATIVE

## 2018-11-07 LAB — POCT PREGNANCY, URINE: Preg Test, Ur: NEGATIVE

## 2018-11-07 MED ORDER — IOHEXOL 300 MG/ML  SOLN
100.0000 mL | Freq: Once | INTRAMUSCULAR | Status: AC | PRN
  Administered 2018-11-07: 100 mL via INTRAVENOUS

## 2018-11-07 MED ORDER — HYDROMORPHONE HCL 1 MG/ML IJ SOLN
0.5000 mg | Freq: Once | INTRAMUSCULAR | Status: AC
  Administered 2018-11-07: 0.5 mg via INTRAVENOUS
  Filled 2018-11-07: qty 1

## 2018-11-07 MED ORDER — SODIUM CHLORIDE 0.9 % IV SOLN
INTRAVENOUS | Status: DC
  Administered 2018-11-07 – 2018-11-08 (×3): via INTRAVENOUS

## 2018-11-07 MED ORDER — SODIUM CHLORIDE 0.9 % IV SOLN
1.0000 g | Freq: Once | INTRAVENOUS | Status: AC
  Administered 2018-11-07: 1 g via INTRAVENOUS
  Filled 2018-11-07: qty 10

## 2018-11-07 MED ORDER — OXYCODONE-ACETAMINOPHEN 5-325 MG PO TABS
1.0000 | ORAL_TABLET | ORAL | Status: DC | PRN
  Administered 2018-11-08 (×4): 2 via ORAL
  Filled 2018-11-07 (×4): qty 2

## 2018-11-07 MED ORDER — SODIUM CHLORIDE 0.9 % IV BOLUS
1000.0000 mL | Freq: Once | INTRAVENOUS | Status: AC
  Administered 2018-11-07: 1000 mL via INTRAVENOUS

## 2018-11-07 MED ORDER — HYDROMORPHONE HCL 1 MG/ML IJ SOLN
0.5000 mg | INTRAMUSCULAR | Status: DC | PRN
  Administered 2018-11-07 – 2018-11-08 (×3): 0.5 mg via INTRAVENOUS
  Filled 2018-11-07 (×2): qty 0.5

## 2018-11-07 MED ORDER — HYDROMORPHONE HCL 1 MG/ML IJ SOLN
1.0000 mg | Freq: Once | INTRAMUSCULAR | Status: AC
  Administered 2018-11-07: 1 mg via INTRAVENOUS
  Filled 2018-11-07: qty 1

## 2018-11-07 MED ORDER — ENOXAPARIN SODIUM 40 MG/0.4ML ~~LOC~~ SOLN
40.0000 mg | SUBCUTANEOUS | Status: DC
  Administered 2018-11-07 – 2018-11-08 (×2): 40 mg via SUBCUTANEOUS
  Filled 2018-11-07 (×2): qty 0.4

## 2018-11-07 MED ORDER — ONDANSETRON HCL 4 MG/2ML IJ SOLN
4.0000 mg | Freq: Once | INTRAMUSCULAR | Status: AC
  Administered 2018-11-07: 17:00:00 4 mg via INTRAVENOUS
  Filled 2018-11-07: qty 2

## 2018-11-07 MED ORDER — DULOXETINE HCL 30 MG PO CPEP
60.0000 mg | ORAL_CAPSULE | Freq: Every day | ORAL | Status: DC
  Administered 2018-11-07 – 2018-11-09 (×3): 60 mg via ORAL
  Filled 2018-11-07 (×3): qty 2

## 2018-11-07 MED ORDER — ALPRAZOLAM 0.5 MG PO TABS
0.5000 mg | ORAL_TABLET | Freq: Three times a day (TID) | ORAL | Status: DC | PRN
  Administered 2018-11-07 – 2018-11-09 (×3): 0.5 mg via ORAL
  Filled 2018-11-07 (×3): qty 1

## 2018-11-07 MED ORDER — SODIUM CHLORIDE 0.9 % IV SOLN
1.0000 g | INTRAVENOUS | Status: DC
  Administered 2018-11-08: 1 g via INTRAVENOUS
  Filled 2018-11-07: qty 1
  Filled 2018-11-07: qty 10

## 2018-11-07 NOTE — ED Notes (Signed)
ED TO INPATIENT HANDOFF REPORT  ED Nurse Name and Phone #: Welford Roche  S Name/Age/Gender Arrie Eastern 53 y.o. female Room/Bed: ED12HA/ED12HA  Code Status   Code Status: Full Code  Home/SNF/Other Home Patient oriented to: self, place, time and situation Is this baseline? Yes   Triage Complete: Triage complete  Chief Complaint Abd pain  Triage Note FIRST NURSE NOTE-mid lower abdominal pain. Started yesterday. Tearful at check in.   C/O lower abdominal pain radiating to lower back x 4 days.  Also c/o burning and frequency of urine. Also, urinary incontinence.    Emesis started while in waiting area.  Also c/o chest pain.  Patient is tearful in triage.   Allergies Allergies  Allergen Reactions  . Lorazepam Hives and Swelling    Patient reports receiving lorazepam intensol in the hospital and experienced swelling with hives.  . Gabapentin Rash    Level of Care/Admitting Diagnosis ED Disposition    ED Disposition Condition [facility] Area: [place] [[id number]]  Level of Care: Med-Surg [16]  Covid Evaluation: Asymptomatic Screening Protocol (No Symptoms)  Diagnosis: Sepsis [facility]) [[id number]]  Admitting Physician: Otila Back [place]  Attending Physician: Otila Back [[id number]]  Estimated length of stay: past midnight tomorrow  Certification:: I certify this patient will need inpatient services for at least 2 midnights  PT Class (Do Not Modify): Inpatient [101]  PT Acc Code (Do Not Modify): Private [1]       B Medical/Surgery History Past Medical History:  Diagnosis Date  . Allergic rhinitis   . Anxiety   . Atrial fibrillation ([place])    ~ 06/2010 single episdose, evaluted at [facility] with testing. No reccurence. Does not see cardiologist (08/01/2018)  . Colon cancer [facility]) 2014   cancer of the sigmoid colon  . Depression   . Diverticulitis    "this is my 2nd time in hospital w/this in the last 2 wk" (06/21/2012)  . Dysrhythmia     1 time issue with A. Fib. In the hospital for 1 week. Spontaneously convereted to NSR.  Marland Kitchen GERD (gastroesophageal reflux disease)   . Gout   . Hypertension   . Lower extremity edema   . Migraines   . Osteoarthritis   . Peripheral neuropathy   . PTSD (post-traumatic stress disorder)   . Restless legs syndrome   . S/P chemotherapy, time since less than 4 weeks   . Tremor, essential    Past Surgical History:  Procedure Laterality Date  . APPENDECTOMY    . CARDIOVASCULAR STRESS TEST     06/24/10 [facility]): No ischemia, EF 53%.  . COLONOSCOPY N/A 08/01/2017   Procedure: COLONOSCOPY;  Surgeon: Lin Landsman, MD;  Location: [facility] ENDOSCOPY;  Service: Gastroenterology;  Laterality: N/A;  . INCISIONAL HERNIA REPAIR  08/03/2018  . INCISIONAL HERNIA REPAIR N/A 08/03/2018   Procedure: LAPAROSCOPIC INCISIONAL HERNIA REPAIR WITH MESH;  Surgeon: Coralie Keens, MD;  Location: [facility];  Service: General;  Laterality: N/A;  . PARTIAL COLECTOMY N/A 06/28/2012   Procedure: PARTIAL COLECTOMY;  Surgeon: Harl Bowie, MD;  Location: [facility];  Service: General;  Laterality: N/A;  . PORTACATH PLACEMENT N/A 07/11/2012   Procedure: INSERTION PORT-A-CATH;  Surgeon: Harl Bowie, MD;  Location: [facility];  Service: General;  Laterality: N/A;  . portacath removal    . TRANSTHORACIC ECHOCARDIOGRAM     06/22/10 [facility]): LVEF 55%. Normal LA size. Mild MR/TR. Trace PI.  . TUBAL LIGATION  ~  1989     A IV Location/Drains/Wounds Patient Lines/Drains/Airways Status   Active Line/Drains/Airways    Name:   Placement date:   Placement time:   Site:   Days:   Peripheral IV 11/07/18 Right Antecubital   11/07/18    1647    Antecubital   less than 1   Incision (Closed) 08/03/18 Abdomen Other (Comment)   08/03/18    1133     96   Incision - 4 Ports Abdomen Right;Upper Right;Medial Right;Lower Left;Medial   08/03/18    1058     96          Intake/Output Last 24 hours No intake or output  data in the 24 hours ending 11/07/18 1902  Labs/Imaging Results for orders placed or performed during the hospital encounter of 11/07/18 (from the past 48 hour(s))  Urinalysis, Complete w Microscopic     Status: Abnormal   Collection Time: 11/07/18  3:44 PM  Result Value Ref Range   Color, Urine AMBER (A) YELLOW    Comment: BIOCHEMICALS MAY BE AFFECTED BY COLOR   APPearance TURBID (A) CLEAR   Specific Gravity, Urine 1.014 1.005 - 1.030   pH 6.0 5.0 - 8.0   Glucose, UA NEGATIVE NEGATIVE mg/dL   Hgb urine dipstick MODERATE (A) NEGATIVE   Bilirubin Urine NEGATIVE NEGATIVE   Ketones, ur NEGATIVE NEGATIVE mg/dL   Protein, ur 100 (A) NEGATIVE mg/dL   Nitrite POSITIVE (A) NEGATIVE   Leukocytes,Ua MODERATE (A) NEGATIVE   RBC / HPF >50 (H) 0 - 5 RBC/hpf   WBC, UA >50 (H) 0 - 5 WBC/hpf   Bacteria, UA NONE SEEN NONE SEEN   Squamous Epithelial / LPF 11-20 0 - 5    Comment: Performed at [facility], [address] [place], [place] [address]  Basic metabolic panel     Status: Abnormal   Collection Time: 11/07/18  3:44 PM  Result Value Ref Range   Sodium 138 135 - 145 mmol/L   Potassium 3.9 3.5 - 5.1 mmol/L   Chloride 100 98 - 111 mmol/L   CO2 26 22 - 32 mmol/L   Glucose, Bld 111 (H) 70 - 99 mg/dL   BUN 12 6 - 20 mg/dL   Creatinine, Ser 0.69 0.44 - 1.00 mg/dL   Calcium 9.6 8.9 - 10.3 mg/dL   GFR calc non Af Amer >60 >60 mL/min   GFR calc Af Amer >60 >60 mL/min   Anion gap 12 5 - 15    Comment: Performed at [facility], [address] [place], [place] [address]  CBC     Status: Abnormal   Collection Time: 11/07/18  3:44 PM  Result Value Ref Range   WBC 14.1 (H) 4.0 - 10.5 K/uL   RBC 4.89 3.87 - 5.11 MIL/uL   Hemoglobin 13.6 12.0 - 15.0 g/dL   HCT 42.2 36.0 - 46.0 %   MCV 86.3 80.0 - 100.0 fL   MCH 27.8 26.0 - 34.0 pg   MCHC 32.2 30.0 - 36.0 g/dL   RDW 13.6 11.5 - 15.5 %   Platelets 410 (H) 150 - 400 K/uL   nRBC 0.0 0.0 - 0.2 %    Comment: Performed at  [facility], [address] [place], [place] [address]  Hepatic function panel     Status: None   Collection Time: 11/07/18  3:44 PM  Result Value Ref Range   Total Protein 7.9 6.5 - 8.1 g/dL   Albumin 4.3 3.5 -  5.0 g/dL   AST 25 15 - 41 U/L   ALT 30 0 - 44 U/L   Alkaline Phosphatase 65 38 - 126 U/L   Total Bilirubin 0.9 0.3 - 1.2 mg/dL   Bilirubin, Direct 0.1 0.0 - 0.2 mg/dL   Indirect Bilirubin 0.8 0.3 - 0.9 mg/dL    Comment: Performed at [facility], [address] [place], [place] [address]  Lipase, blood     Status: None   Collection Time: 11/07/18  3:44 PM  Result Value Ref Range   Lipase 22 11 - 51 U/L    Comment: Performed at [facility], [address] [place], [place] [address]  Pregnancy, urine POC     Status: None   Collection Time: 11/07/18  3:54 PM  Result Value Ref Range   Preg Test, Ur NEGATIVE NEGATIVE    Comment:        THE SENSITIVITY OF THIS METHODOLOGY IS >24 mIU/mL   Lactic acid, plasma     Status: None   Collection Time: 11/07/18  4:50 PM  Result Value Ref Range   Lactic Acid, Venous 1.1 0.5 - 1.9 mmol/L    Comment: Performed at [facility], [address], [place], [place] [address]   Ct Abdomen Pelvis W Contrast  Result Date: 11/07/2018 CLINICAL DATA:  Lower abdominal pain radiating to the back. EXAM: CT ABDOMEN AND PELVIS WITH CONTRAST TECHNIQUE: Multidetector CT imaging of the abdomen and pelvis was performed using the standard protocol following bolus administration of intravenous contrast. CONTRAST:  173m OMNIPAQUE IOHEXOL 300 MG/ML  SOLN COMPARISON:  May 13, 2018 FINDINGS: Lower chest: No acute abnormality. Hepatobiliary: No focal liver abnormality is seen. No gallstones, gallbladder wall thickening, or biliary dilatation. Pancreas: Unremarkable. No pancreatic ductal dilatation or surrounding inflammatory changes. Spleen: Normal in size without focal abnormality. Adrenals/Urinary Tract: Normal adrenal  glands. Right parapelvic cysts. Duplicated left collecting system. Diffuse periureteral stranding around the left ureters, 1 of which is mildly diffusely dilated to the level of the vesicoureteral junction. No renal or ureteral calculi seen. Hyperenhancement of the left ureters. Stomach/Bowel: Stomach is within normal limits. Post appendectomy. Enterotomy line at the level of the sigmoid colon without complicating features. No evidence of bowel wall thickening, distention, or inflammatory changes. Vascular/Lymphatic: No significant vascular findings are present. No enlarged abdominal or pelvic lymph nodes. Reproductive: Uterus and bilateral adnexa are unremarkable. Other: No abdominal wall hernia or abnormality. No abdominopelvic ascites. Musculoskeletal: Posterior facet arthropathy in the lower lumbosacral spine. IMPRESSION: 1. Duplicated left collecting system. 2. Diffuse periureteral stranding around and hyperenhancement of the left ureters. The lower moiety ureter is diffusely dilated to the level of the vesicoureteral junction. No renal or ureteral calculi seen. These findings may be due to infectious/inflammatory changes or potentially a recently passed stone. 3. No evidence of small-bowel obstruction. 4. Enterotomy line at the level of the sigmoid colon without complicating features. 5. Posterior facet arthropathy in the lower lumbosacral spine. Electronically Signed   By: DFidela SalisburyM.D.   On: 11/07/2018 17:15    Pending Labs Unresulted Labs (From admission, onward)    Start     Ordered   11/08/18 0500  Protime-INR  Tomorrow morning,   STAT     11/07/18 1901   11/08/18 0500  Cortisol-am, blood  Tomorrow morning,   STAT     11/07/18 1901   11/08/18 0500  Procalcitonin  Tomorrow morning,   STAT     11/07/18 1901  11/07/18 1900  HIV antibody (Routine Testing)  Once,   STAT     11/07/18 1901   11/07/18 1820  Urine culture  ONCE - STAT,   STAT     11/07/18 1819   11/07/18 1815  SARS  Coronavirus 2 [facility] order, Performed in [facility] hospital lab) Nasopharyngeal Nasopharyngeal Swab  (Symptomatic/High Risk of Exposure/Tier 1 Patients Labs with Precautions)  Once,   STAT    Question Answer Comment  Is this test for diagnosis or screening Diagnosis of ill patient   Symptomatic for COVID-19 as defined by CDC Yes   Date of Symptom Onset 11/07/2018   Hospitalized for COVID-19 Yes   Admitted to ICU for COVID-19 No   Previously tested for COVID-19 Yes   Resident in a congregate (group) care setting No   Employed in healthcare setting No   Pregnant No      11/07/18 1815   11/07/18 1630  Blood culture (routine x 2)  BLOOD CULTURE X 2,   STAT     11/07/18 1630          Vitals/Pain Today's Vitals   11/07/18 1539 11/07/18 1755 11/07/18 1759 11/07/18 1828  BP: (!) 110/91  (!) 153/96   Pulse: (!) 104  (!) 102   Resp: 20  20   Temp: 98.3 F (36.8 C)     TempSrc: Oral     SpO2: 98%  98%   Weight:      Height:      PainSc:  9   5     Isolation Precautions No active isolations  Medications Medications  enoxaparin (LOVENOX) injection 40 mg (has no administration in time range)  ondansetron (ZOFRAN) injection 4 mg (4 mg Intravenous Given 11/07/18 1648)  sodium chloride 0.9 % bolus 1,000 mL (1,000 mLs Intravenous New Bag/Given 11/07/18 1647)  HYDROmorphone (DILAUDID) injection 0.5 mg (0.5 mg Intravenous Given 11/07/18 1649)  iohexol (OMNIPAQUE) 300 MG/ML solution 100 mL (100 mLs Intravenous Contrast Given 11/07/18 1654)  HYDROmorphone (DILAUDID) injection 1 mg (1 mg Intravenous Given 11/07/18 1758)  cefTRIAXone (ROCEPHIN) 1 g in sodium chloride 0.9 % 100 mL IVPB (0 g Intravenous Stopped 11/07/18 1856)  sodium chloride 0.9 % bolus 1,000 mL (1,000 mLs Intravenous New Bag/Given 11/07/18 1858)    Mobility walks Low fall risk   Focused Assessments pain   R Recommendations: See Admitting Provider Note  Report given to:   Additional Notes:

## 2018-11-07 NOTE — ED Provider Notes (Signed)
[facility] Emergency Department Provider Note  ____________________________________________   First MD Initiated Contact with Patient 11/07/18 1608     (approximate)  I have reviewed the triage vital signs and the nursing notes.   HISTORY  Chief Complaint Abdominal Pain    HPI Sheri Calderon is a 53 y.o. female with colon cancer status post colon resection who presents with abdominal pain.  Patient endorses lower abdominal pain that is severe, constant, nothing makes better, nothing makes it worse.  Has been associate with some vomiting.  Patient also has been having some urinary symptoms as well, dysuria x4 days.  Denies prior history of kidney stones or UTI.  Having some right flank tenderness as well.          Past Medical History:  Diagnosis Date   Allergic rhinitis    Anxiety    Atrial fibrillation ([place])    ~ 06/2010 single episdose, evaluted at [facility] with testing. No reccurence. Does not see cardiologist (08/01/2018)   Colon cancer [facility]) 2014   cancer of the sigmoid colon   Depression    Diverticulitis    "this is my 2nd time in hospital w/this in the last 2 wk" (06/21/2012)   Dysrhythmia    1 time issue with A. Fib. In the hospital for 1 week. Spontaneously convereted to NSR.   GERD (gastroesophageal reflux disease)    Gout    Hypertension    Lower extremity edema    Migraines    Osteoarthritis    Peripheral neuropathy    PTSD (post-traumatic stress disorder)    Restless legs syndrome    S/P chemotherapy, time since less than 4 weeks    Tremor, essential     Patient Active Problem List   Diagnosis Date Noted   Incisional hernia 08/03/2018   CAP (community acquired pneumonia) 05/13/2018   Ileitis 07/30/2017   Generalized abdominal pain    Genetic testing 02/14/2016   Personal history of sigmoid colon cancer 10/29/2014   Weight gain 10/29/2014   Cancer of sigmoid colon ([place]) 08/04/2012    Restless legs syndrome     Past Surgical History:  Procedure Laterality Date   APPENDECTOMY     CARDIOVASCULAR STRESS TEST     06/24/10 [facility]): No ischemia, EF 53%.   COLONOSCOPY N/A 08/01/2017   Procedure: COLONOSCOPY;  Surgeon: Lin Landsman, MD;  Location: [facility] ENDOSCOPY;  Service: Gastroenterology;  Laterality: N/A;   INCISIONAL HERNIA REPAIR  08/03/2018   INCISIONAL HERNIA REPAIR N/A 08/03/2018   Procedure: LAPAROSCOPIC INCISIONAL HERNIA REPAIR WITH MESH;  Surgeon: Coralie Keens, MD;  Location: [place];  Service: General;  Laterality: N/A;   PARTIAL COLECTOMY N/A 06/28/2012   Procedure: PARTIAL COLECTOMY;  Surgeon: Harl Bowie, MD;  Location: [place];  Service: General;  Laterality: N/A;   PORTACATH PLACEMENT N/A 07/11/2012   Procedure: INSERTION PORT-A-CATH;  Surgeon: Harl Bowie, MD;  Location: [facility];  Service: General;  Laterality: N/A;   portacath removal     TRANSTHORACIC ECHOCARDIOGRAM     06/22/10 [facility]): LVEF 55%. Normal LA size. Mild MR/TR. Trace PI.   TUBAL LIGATION  ~ 1989    Prior to Admission medications   Medication Sig Start Date End Date Taking? Authorizing Provider  acetaminophen (TYLENOL) 500 MG tablet Take 2,000-3,000 mg by mouth 2 (two) times daily as needed for moderate pain or headache.    [provider]  ALPRAZolam Duanne Moron) 0.5 MG tablet Take 0.5 mg  by mouth 3 (three) times daily as needed for anxiety. 07/18/17   [provider]  DULoxetine (CYMBALTA) 60 MG capsule Take 60 mg by mouth daily.     [provider]  fluticasone (FLONASE) 50 MCG/ACT nasal spray Place 1 spray into both nostrils daily as needed for allergies or rhinitis.    [provider]  lisinopril (ZESTRIL) 10 MG tablet Take 10 mg by mouth daily.    [provider]  omeprazole (PRILOSEC) 20 MG capsule Take 20 mg by mouth daily.    [provider]  ondansetron (ZOFRAN) 8 MG tablet Take 8 mg by mouth  every 8 (eight) hours as needed for nausea or vomiting.    [provider]  oxyCODONE (OXY IR/ROXICODONE) 5 MG immediate release tablet Take 1 tablet (5 mg total) by mouth every 6 (six) hours as needed for moderate pain or severe pain. 08/04/18   Coralie Keens, MD  rizatriptan (MAXALT) 10 MG tablet Take 10 mg by mouth See admin instructions. Take 10 mg by mouth at onset of headache. Repeat in 2 hours if needed. Maximum 2 tablets in 24 hours. 06/10/17   [provider]    Allergies Lorazepam and Gabapentin  Family History  Problem Relation Age of Onset   Diabetes Mother    Heart disease Mother    Stomach cancer Mother    Ovarian cancer Mother 33   COPD Father    Hypertension Father    Hyperlipidemia Father    Colon polyps Father        53 lifetime colon polyps   Down syndrome Paternal Aunt    Cancer Paternal Uncle        cancer in the spine   COPD Maternal Grandmother    Stomach cancer Paternal Grandmother     Social History Social History   Tobacco Use   Smoking status: Current Every Day Smoker    Packs/day: 0.20    Years: 15.00    Pack years: 3.00    Types: Cigarettes    Start date: 01/17/1991   Smokeless tobacco: Never Used  Substance Use Topics   Alcohol use: No    Alcohol/week: 0.0 standard drinks   Drug use: No      Review of Systems Constitutional: No fever/chills Eyes: No visual changes. ENT: No sore throat. Cardiovascular: Denies chest pain. Respiratory: Denies shortness of breath. Gastrointestinal: Positive abdominal pain, vomiting Genitourinary: Positive dysuria Musculoskeletal: Negative for back pain. Skin: Negative for rash. Neurological: Negative for headaches, focal weakness or numbness. All other ROS negative ____________________________________________   PHYSICAL EXAM:  VITAL SIGNS: ED Triage Vitals  Enc Vitals Group     BP 11/07/18 1539 (!) 110/91     Pulse Rate 11/07/18 1539 (!) 104     Resp  11/07/18 1539 20     Temp 11/07/18 1539 98.3 F (36.8 C)     Temp Source 11/07/18 1539 Oral     SpO2 11/07/18 1539 98 %     Weight 11/07/18 1536 208 lb 15.9 oz (94.8 kg)     Height 11/07/18 1536 5' 7"  (1.702 m)     Head Circumference --      Peak Flow --      Pain Score 11/07/18 1536 9     Pain Loc --      Pain Edu? --      Excl. in [place]? --     Constitutional: Alert and oriented. Well appearing and in no acute distress. Eyes: Conjunctivae are  normal. EOMI. Head: Atraumatic. Nose: No congestion/rhinnorhea. Mouth/Throat: Mucous membranes are moist.   Neck: No stridor. Trachea Midline. FROM Cardiovascular: tachycardiac , regular rhythm. Grossly normal heart sounds.  Good peripheral circulation. Respiratory: Normal respiratory effort.  No retractions. Lungs CTAB. Gastrointestinal: No distention. No abdominal bruits. Suprapubic tenderness and lower abd tenderness  Musculoskeletal: No lower extremity tenderness nor edema.  No joint effusions. Tender on flanks  Neurologic:  Normal speech and language. No gross focal neurologic deficits are appreciated.  Skin:  Skin is warm, dry and intact. No rash noted. Psychiatric: Mood and affect are normal. Speech and behavior are normal. GU: Deferred   ____________________________________________   LABS (all labs ordered are listed, but only abnormal results are displayed)  Labs Reviewed  URINALYSIS, COMPLETE (UACMP) WITH MICROSCOPIC - Abnormal; Notable for the following components:      Result Value   Color, Urine AMBER (*)    APPearance TURBID (*)    Hgb urine dipstick MODERATE (*)    Protein, ur 100 (*)    Nitrite POSITIVE (*)    Leukocytes,Ua MODERATE (*)    RBC / HPF >50 (*)    WBC, UA >50 (*)    All other components within normal limits  BASIC METABOLIC PANEL - Abnormal; Notable for the following components:   Glucose, Bld 111 (*)    All other components within normal limits  CBC - Abnormal; Notable for the following components:     WBC 14.1 (*)    Platelets 410 (*)    All other components within normal limits  POCT PREGNANCY, URINE  POC URINE PREG, ED   ____________________________________________  RADIOLOGY   Official radiology report(s): Ct Abdomen Pelvis W Contrast  Result Date: 11/07/2018 CLINICAL DATA:  Lower abdominal pain radiating to the back. EXAM: CT ABDOMEN AND PELVIS WITH CONTRAST TECHNIQUE: Multidetector CT imaging of the abdomen and pelvis was performed using the standard protocol following bolus administration of intravenous contrast. CONTRAST:  168m OMNIPAQUE IOHEXOL 300 MG/ML  SOLN COMPARISON:  May 13, 2018 FINDINGS: Lower chest: No acute abnormality. Hepatobiliary: No focal liver abnormality is seen. No gallstones, gallbladder wall thickening, or biliary dilatation. Pancreas: Unremarkable. No pancreatic ductal dilatation or surrounding inflammatory changes. Spleen: Normal in size without focal abnormality. Adrenals/Urinary Tract: Normal adrenal glands. Right parapelvic cysts. Duplicated left collecting system. Diffuse periureteral stranding around the left ureters, 1 of which is mildly diffusely dilated to the level of the vesicoureteral junction. No renal or ureteral calculi seen. Hyperenhancement of the left ureters. Stomach/Bowel: Stomach is within normal limits. Post appendectomy. Enterotomy line at the level of the sigmoid colon without complicating features. No evidence of bowel wall thickening, distention, or inflammatory changes. Vascular/Lymphatic: No significant vascular findings are present. No enlarged abdominal or pelvic lymph nodes. Reproductive: Uterus and bilateral adnexa are unremarkable. Other: No abdominal wall hernia or abnormality. No abdominopelvic ascites. Musculoskeletal: Posterior facet arthropathy in the lower lumbosacral spine. IMPRESSION: 1. Duplicated left collecting system. 2. Diffuse periureteral stranding around and hyperenhancement of the left ureters. The lower moiety  ureter is diffusely dilated to the level of the vesicoureteral junction. No renal or ureteral calculi seen. These findings may be due to infectious/inflammatory changes or potentially a recently passed stone. 3. No evidence of small-bowel obstruction. 4. Enterotomy line at the level of the sigmoid colon without complicating features. 5. Posterior facet arthropathy in the lower lumbosacral spine. Electronically Signed   By: DFidela SalisburyM.D.   On: 11/07/2018 17:15    ____________________________________________  PROCEDURES  Procedure(s) performed (including Critical Care):  .Critical Care Performed by: Vanessa Goree, MD Authorized by: Vanessa [facility], MD   Critical care provider statement:    Critical care time (minutes):  30   Critical care was necessary to treat or prevent imminent or life-threatening deterioration of the following conditions:  Sepsis   Critical care was time spent personally by me on the following activities:  Discussions with consultants, evaluation of patient's response to treatment, examination of patient, ordering and performing treatments and interventions, ordering and review of laboratory studies, ordering and review of radiographic studies, pulse oximetry, re-evaluation of patient's condition, obtaining history from patient or surrogate and review of old charts     ____________________________________________   INITIAL IMPRESSION / ASSESSMENT AND PLAN / ED COURSE  ELEANOR GATLIFF was evaluated in Emergency Department on 11/07/2018 for the symptoms described in the history of present illness. She was evaluated in the context of the global COVID-19 pandemic, which necessitated consideration that the patient might be at risk for infection with the SARS-CoV-2 virus that causes COVID-19. Institutional protocols and algorithms that pertain to the evaluation of patients at risk for COVID-19 are in a state of rapid change based on information released by regulatory  bodies including the CDC and federal and state organizations. These policies and algorithms were followed during the patient's care in the ED.    Patient presents with lower abdominal pain, suprapubic pain, dysuria.  With CT scan to rule SBO, infected kidney stone.  Urine looks consistent with UTI.  Patient does meet sirs criteria so will cover with antibiotics and get blood cultures.   Urine with greater than 50 WBCs and greater than 50 RBCs.  Patient is positive for nitrites.  White count is elevated at 14.1  Kidney function is normal.  CT is otherwise negative.  Given patient meets sirs criteria will admit patient to the hospital and place patient on ceftriaxone.      ____________________________________________   FINAL CLINICAL IMPRESSION(S) / ED DIAGNOSES   Final diagnoses:  Pyelonephritis      MEDICATIONS GIVEN DURING THIS VISIT:  Medications  cefTRIAXone (ROCEPHIN) 1 g in sodium chloride 0.9 % 100 mL IVPB (1 g Intravenous New Bag/Given 11/07/18 1821)  sodium chloride 0.9 % bolus 1,000 mL (has no administration in time range)  ondansetron (ZOFRAN) injection 4 mg (4 mg Intravenous Given 11/07/18 1648)  sodium chloride 0.9 % bolus 1,000 mL (1,000 mLs Intravenous New Bag/Given 11/07/18 1647)  HYDROmorphone (DILAUDID) injection 0.5 mg (0.5 mg Intravenous Given 11/07/18 1649)  iohexol (OMNIPAQUE) 300 MG/ML solution 100 mL (100 mLs Intravenous Contrast Given 11/07/18 1654)  HYDROmorphone (DILAUDID) injection 1 mg (1 mg Intravenous Given 11/07/18 1758)     ED Discharge Orders    None       Note:  This document was prepared using Dragon voice recognition software and may include unintentional dictation errors.   Vanessa [place], MD 11/07/18 [phone]

## 2018-11-07 NOTE — Progress Notes (Signed)
CODE SEPSIS - PHARMACY COMMUNICATION  **Broad Spectrum Antibiotics should be administered within 1 hour of Sepsis diagnosis**  Time Code Sepsis Called/Page Received:  9/7 @ 1900   Antibiotics Ordered: ceftriaxone 1 gm   Time of 1st antibiotic administration: 9/7 @ 1821   Additional action taken by pharmacy:   If necessary, Name of Provider/Nurse Contacted:     Liviana Mills D ,PharmD Clinical Pharmacist  11/07/2018  7:07 PM

## 2018-11-07 NOTE — ED Triage Notes (Signed)
FIRST NURSE NOTE-mid lower abdominal pain. Started yesterday. Tearful at check in.

## 2018-11-07 NOTE — H&P (Signed)
[place] [facility] [place] NAME: Sheri Calderon    MR#:  [id number]  DATE OF BIRTH:  [birth year]  DATE OF ADMISSION:  11/07/2018  PRIMARY CARE PHYSICIAN: Alvester Chou, NP   REQUESTING/REFERRING PHYSICIAN: Marjean Donna  CHIEF COMPLAINT:   Chief Complaint  Patient presents with  . Abdominal Pain    HISTORY OF PRESENT ILLNESS:  Sheri Calderon  is a 53 y.o. female with a known history of colon cancer status post resection in 2014 with radiation and chemotherapy, depression, peripheral neuropathy, restless leg syndrome and hypertension who presented to the emergency room with complaints of lower abdominal pains and dysuria which is been going on for the last 4 days.  Denies any fevers.  Had some nausea and vomiting today.  No diarrhea.  Patient was evaluated in the emergency room and found to be tachycardic with heart rate of 104.  Evidence of leukocytosis with white count of 14,000.  Urinalysis with findings of urinary tract infection.  CT scan of the abdomen and pelvis done reviewed duplicated left collecting system. Diffuse periureteral stranding around and hyperenhancement of the left ureters. The lower moiety ureter is diffusely dilated to the level of the vesicoureteral junction. No renal or ureteral calculi seen. These findings may be due to infectious/inflammatory changes or potentially a recently passed stone.  Patient was diagnosed with sepsis secondary to urinary tract infection with concern for pyelonephritis.  Was given a dose of IV Rocephin.  Medical service called to admit patient for further evaluation and management.  PAST MEDICAL HISTORY:   Past Medical History:  Diagnosis Date  . Allergic rhinitis   . Anxiety   . Atrial fibrillation ([place])    ~ 06/2010 single episdose, evaluted at [facility] with testing. No reccurence. Does not see cardiologist (08/01/2018)  . Colon cancer [facility]) 2014   cancer of the sigmoid colon  . Depression   .  Diverticulitis    "this is my 2nd time in hospital w/this in the last 2 wk" (06/21/2012)  . Dysrhythmia    1 time issue with A. Fib. In the hospital for 1 week. Spontaneously convereted to NSR.  Marland Kitchen GERD (gastroesophageal reflux disease)   . Gout   . Hypertension   . Lower extremity edema   . Migraines   . Osteoarthritis   . Peripheral neuropathy   . PTSD (post-traumatic stress disorder)   . Restless legs syndrome   . S/P chemotherapy, time since less than 4 weeks   . Tremor, essential     PAST SURGICAL HISTORY:   Past Surgical History:  Procedure Laterality Date  . APPENDECTOMY    . CARDIOVASCULAR STRESS TEST     06/24/10 [facility]): No ischemia, EF 53%.  . COLONOSCOPY N/A 08/01/2017   Procedure: COLONOSCOPY;  Surgeon: Lin Landsman, MD;  Location[facility] END[facility]COPY;  Service: Gastroenterology;  Laterality: N/A;  . INCISIONAL HERNIA REPAIR  08/03/2018  . INCISIONAL HERNIA REPAIR N/A 08/03/2018   Procedure: LAPAROSCOPIC INCISIONAL HERNIA REPAIR WITH MESH;  Surgeon: Coralie Keens, MD;  Location: [place];  Service: General;  Laterality: N/A;  . PARTIAL COLECTOMY N/A 06/28/2012   Procedure: PARTIAL COLECTOMY;  Surgeon: Harl Bowie, MD;  Location: [place];  Service: General;  Laterality: N/A;  . PORTACATH PLACEMENT N/A 07/11/2012   Procedure: INSERTION PORT-A-CATH;  Surgeon: Harl Bowie, MD;  Location: [facility];  Service: General;  Laterality: N/A;  . portacath removal    . TRANSTHORACIC ECHOCARDIOGRAM  06/22/10 [facility]): LVEF 55%. Normal LA size. Mild MR/TR. Trace PI.  . TUBAL LIGATION  ~ 1989    SOCIAL HISTORY:   Social History   Tobacco Use  . Smoking status: Current Every Day Smoker    Packs/day: 0.20    Years: 15.00    Pack years: 3.00    Types: Cigarettes    Start date: 01/17/1991  . Smokeless tobacco: Never Used  Substance Use Topics  . Alcohol use: No    Alcohol/week: 0.0 standard drinks    FAMILY HISTORY:   Family History  Problem  Relation Age of Onset  . Diabetes Mother   . Heart disease Mother   . Stomach cancer Mother   . Ovarian cancer Mother 84  . COPD Father   . Hypertension Father   . Hyperlipidemia Father   . Colon polyps Father        46 lifetime colon polyps  . Down syndrome Paternal Aunt   . Cancer Paternal Uncle        cancer in the spine  . COPD Maternal Grandmother   . Stomach cancer Paternal Grandmother     DRUG ALLERGIES:   Allergies  Allergen Reactions  . Lorazepam Hives and Swelling    Patient reports receiving lorazepam intensol in the hospital and experienced swelling with hives.  . Gabapentin Rash    REVIEW OF SYSTEMS:   Review of Systems  Constitutional: Negative for chills and fever.  HENT: Negative for hearing loss and tinnitus.   Eyes: Negative for blurred vision and double vision.  Respiratory: Negative for cough, sputum production and shortness of breath.   Cardiovascular: Negative for chest pain and palpitations.  Gastrointestinal: Positive for nausea and vomiting. Negative for heartburn.  Genitourinary: Positive for dysuria.  Musculoskeletal: Negative for myalgias and neck pain.  Skin: Negative for itching and rash.  Neurological: Negative for dizziness.  Psychiatric/Behavioral: Negative for depression and hallucinations.    MEDICATIONS AT HOME:   Prior to Admission medications   Medication Sig Start Date End Date Taking? Authorizing Provider  acetaminophen (TYLENOL) 500 MG tablet Take 2,000-3,000 mg by mouth 2 (two) times daily as needed for moderate pain or headache.    [provider]  ALPRAZolam Duanne Moron) 0.5 MG tablet Take 0.5 mg by mouth 3 (three) times daily as needed for anxiety. 07/18/17   [provider]  DULoxetine (CYMBALTA) 60 MG capsule Take 60 mg by mouth daily.     [provider]  fluticasone (FLONASE) 50 MCG/ACT nasal spray Place 1 spray into both nostrils daily as needed for allergies or rhinitis.    [provider]  lisinopril (ZESTRIL) 10 MG tablet Take 10 mg by mouth daily.    [provider]  omeprazole (PRILOSEC) 20 MG capsule Take 20 mg by mouth daily.    [provider]  ondansetron (ZOFRAN) 8 MG tablet Take 8 mg by mouth every 8 (eight) hours as needed for nausea or vomiting.    [provider]  oxyCODONE (OXY IR/ROXICODONE) 5 MG immediate release tablet Take 1 tablet (5 mg total) by mouth every 6 (six) hours as needed for moderate pain or severe pain. Patient not taking: Reported on 11/07/2018 08/04/18   Coralie Keens, MD  rizatriptan (MAXALT) 10 MG tablet Take 10 mg by mouth See admin instructions. Take 10 mg by mouth at onset of headache. Repeat in 2 hours if needed. Maximum 2 tablets in 24 hours. 06/10/17   [provider]  VITAL SIGNS:  Blood pressure (!) 153/96, pulse (!) 102, temperature 98.3 F (36.8 C), temperature source Oral, resp. rate 20, height 5' 7"  (1.702 m), weight 94.8 kg, last menstrual period 10/12/2012, SpO2 98 %.  PHYSICAL EXAMINATION:  Physical Exam  GENERAL:  53 y.o.-year-old patient lying in the bed with no acute distress.  EYES: Pupils equal, round, reactive to light and accommodation. No scleral icterus. Extraocular muscles intact.  HEENT: Head atraumatic, normocephalic. Oropharynx and nasopharynx clear.  NECK:  Supple, no jugular venous distention. No thyroid enlargement, no tenderness.  LUNGS: Normal breath sounds bilaterally, no wheezing, rales,rhonchi or crepitation. No use of accessory muscles of respiration.  CARDIOVASCULAR: S1, S2 normal. No murmurs, rubs, or gallops.  ABDOMEN: Soft, suprapubic tenderness on exam.  No rebound or guarding. Bowel sounds present. No organomegaly or mass.  EXTREMITIES: No pedal edema, cyanosis, or clubbing.  NEUROLOGIC: Cranial nerves II through XII are intact. Muscle strength 5/5 in all extremities. Sensation intact. Gait not checked.  PSYCHIATRIC: The patient is alert and oriented x 3.   SKIN: No obvious rash, lesion, or ulcer.   LABORATORY PANEL:   CBC Recent Labs  Lab 11/07/18 1544  WBC 14.1*  HGB 13.6  HCT 42.2  PLT 410*   ------------------------------------------------------------------------------------------------------------------  Chemistries  Recent Labs  Lab 11/07/18 1544  NA 138  K 3.9  CL 100  CO2 26  GLUCOSE 111*  BUN 12  CREATININE 0.69  CALCIUM 9.6  AST 25  ALT 30  ALKPHOS 65  BILITOT 0.9   ------------------------------------------------------------------------------------------------------------------  Cardiac Enzymes No results for input(s): TROPONINI in the last 168 hours. ------------------------------------------------------------------------------------------------------------------  RADIOLOGY:  Ct Abdomen Pelvis W Contrast  Result Date: 11/07/2018 CLINICAL DATA:  Lower abdominal pain radiating to the back. EXAM: CT ABDOMEN AND PELVIS WITH CONTRAST TECHNIQUE: Multidetector CT imaging of the abdomen and pelvis was performed using the standard protocol following bolus administration of intravenous contrast. CONTRAST:  163m OMNIPAQUE IOHEXOL 300 MG/ML  SOLN COMPARISON:  May 13, 2018 FINDINGS: Lower chest: No acute abnormality. Hepatobiliary: No focal liver abnormality is seen. No gallstones, gallbladder wall thickening, or biliary dilatation. Pancreas: Unremarkable. No pancreatic ductal dilatation or surrounding inflammatory changes. Spleen: Normal in size without focal abnormality. Adrenals/Urinary Tract: Normal adrenal glands. Right parapelvic cysts. Duplicated left collecting system. Diffuse periureteral stranding around the left ureters, 1 of which is mildly diffusely dilated to the level of the vesicoureteral junction. No renal or ureteral calculi seen. Hyperenhancement of the left ureters. Stomach/Bowel: Stomach is within normal limits. Post appendectomy. Enterotomy line at the level of the sigmoid colon without complicating  features. No evidence of bowel wall thickening, distention, or inflammatory changes. Vascular/Lymphatic: No significant vascular findings are present. No enlarged abdominal or pelvic lymph nodes. Reproductive: Uterus and bilateral adnexa are unremarkable. Other: No abdominal wall hernia or abnormality. No abdominopelvic ascites. Musculoskeletal: Posterior facet arthropathy in the lower lumbosacral spine. IMPRESSION: 1. Duplicated left collecting system. 2. Diffuse periureteral stranding around and hyperenhancement of the left ureters. The lower moiety ureter is diffusely dilated to the level of the vesicoureteral junction. No renal or ureteral calculi seen. These findings may be due to infectious/inflammatory changes or potentially a recently passed stone. 3. No evidence of small-bowel obstruction. 4. Enterotomy line at the level of the sigmoid colon without complicating features. 5. Posterior facet arthropathy in the lower lumbosacral spine. Electronically Signed   By: DFidela SalisburyM.D.   On: 11/07/2018 17:15      IMPRESSION AND PLAN:  Patient is a  53 year old female with history of colon cancer status post surgical resection in 2014 as well as chemotherapy and radiation treatment with hypertension admitted with sepsis secondary to UTI with concern for pyelonephritis.  1.  Sepsis secondary to UTI Concern for pyelonephritis based on imaging studies. Patient otherwise hemodynamically stable.  Lactic acid level normal. Continue IV Rocephin 1 g daily and follow-up on urine culture and sensitivities. To consider urology consultation in a.m. if any worsening of clinical condition based on findings on CT scan. IV fluid hydration  2.  Hypertension Blood pressure with systolic blood pressure in the 160s.. To resume home blood pressure medications once medication reconciliation is done PRN IV hydralazine with parameters  3.  History of colon cancer Status post surgical resection in 2014.  Status  post chemo and radiation therapy. Stable.  DVT prophylaxis; Lovenox  All the records are reviewed and case discussed with ED provider. Management plans discussed with the patient, and she is in agreement.  CODE STATUS: Full code  TOTAL TIME TAKING CARE OF THIS PATIENT: 58 minutes.    Damarius Karnes M.D on 11/07/2018 at 7:12 PM  Between 7am to 6pm - Pager - [phone]  After 6pm go to [email or web address] - Proofreader  [facility]  Office  [phone]  CC: Primary care physician; Alvester Chou, NP   Note: This dictation was prepared with Dragon dictation along with smaller phrase technology. Any transcriptional errors that result from this process are unintentional.

## 2018-11-07 NOTE — ED Triage Notes (Addendum)
C/O lower abdominal pain radiating to lower back x 4 days.  Also c/o burning and frequency of urine. Also, urinary incontinence.    Emesis started while in waiting area.  Also c/o chest pain.  Patient is tearful in triage.

## 2018-11-08 LAB — PROTIME-INR
INR: 1 (ref 0.8–1.2)
Prothrombin Time: 13.1 seconds (ref 11.4–15.2)

## 2018-11-08 LAB — CBC
HCT: 36.4 % (ref 36.0–46.0)
Hemoglobin: 11.2 g/dL — ABNORMAL LOW (ref 12.0–15.0)
MCH: 27.5 pg (ref 26.0–34.0)
MCHC: 30.8 g/dL (ref 30.0–36.0)
MCV: 89.4 fL (ref 80.0–100.0)
Platelets: 297 10*3/uL (ref 150–400)
RBC: 4.07 MIL/uL (ref 3.87–5.11)
RDW: 13.9 % (ref 11.5–15.5)
WBC: 9.4 10*3/uL (ref 4.0–10.5)
nRBC: 0 % (ref 0.0–0.2)

## 2018-11-08 LAB — BASIC METABOLIC PANEL
Anion gap: 6 (ref 5–15)
BUN: 10 mg/dL (ref 6–20)
CO2: 28 mmol/L (ref 22–32)
Calcium: 8.3 mg/dL — ABNORMAL LOW (ref 8.9–10.3)
Chloride: 104 mmol/L (ref 98–111)
Creatinine, Ser: 0.74 mg/dL (ref 0.44–1.00)
GFR calc Af Amer: >60 mL/min (ref >60)
GFR calc non Af Amer: >60 mL/min (ref >60)
Glucose, Bld: 106 mg/dL — ABNORMAL HIGH (ref 70–99)
Potassium: 3.6 mmol/L (ref 3.5–5.1)
Sodium: 138 mmol/L (ref 135–145)

## 2018-11-08 LAB — URINE CULTURE

## 2018-11-08 LAB — PROCALCITONIN: Procalcitonin: <0.1 ng/mL

## 2018-11-08 LAB — CORTISOL-AM, BLOOD: Cortisol - AM: 4.9 ug/dL — ABNORMAL LOW (ref 6.7–22.6)

## 2018-11-08 LAB — MAGNESIUM: Magnesium: 1.9 mg/dL (ref 1.7–2.4)

## 2018-11-08 MED ORDER — HYDROMORPHONE HCL 1 MG/ML IJ SOLN
INTRAMUSCULAR | Status: AC
  Filled 2018-11-08: qty 1

## 2018-11-08 MED ORDER — PANTOPRAZOLE SODIUM 20 MG PO TBEC
20.0000 mg | DELAYED_RELEASE_TABLET | Freq: Every day | ORAL | Status: DC
  Administered 2018-11-08 – 2018-11-09 (×2): 20 mg via ORAL
  Filled 2018-11-08 (×2): qty 1

## 2018-11-08 MED ORDER — HYDROMORPHONE HCL 1 MG/ML IJ SOLN
1.0000 mg | INTRAMUSCULAR | Status: DC | PRN
  Administered 2018-11-08 – 2018-11-09 (×4): 1 mg via INTRAVENOUS
  Filled 2018-11-08 (×5): qty 1

## 2018-11-08 NOTE — Progress Notes (Signed)
Advanced care plan. Purpose of the Encounter: CODE STATUS Parties in Attendance: Patient Patient's Decision Capacity: Good Subjective/Patient's story: Sheri Calderon  is a 53 y.o. female with a known history of colon cancer status post resection in 2014 with radiation and chemotherapy, depression, peripheral neuropathy, restless leg syndrome and hypertension who presented to the emergency room with complaints of lower abdominal pains and dysuria which is been going on for the last 4 days.  Denies any fevers.  Had some nausea and vomiting today.  No diarrhea.  Patient was evaluated in the emergency room and found to be tachycardic with heart rate of 104.  Evidence of leukocytosis with white count of 14,000.  Urinalysis with findings of urinary tract infection.  CT scan of the abdomen and pelvis done reviewed duplicated left collecting system. Diffuse periureteral stranding around and hyperenhancement of the left ureters. The lower moiety ureter is diffusely dilated to the level of the vesicoureteral junction. No renal or ureteral calculi seen. These findings may be due to infectious/inflammatory changes or potentially a recently passed stone.  Patient was diagnosed with sepsis secondary to urinary tract infection with concern for pyelonephritis.  Was given a dose of IV Rocephin.  Medical service called to admit patient for further evaluation and management. Objective/Medical story Patient needs IV fluids antibiotics Needs aggressive fluid resuscitation for sepsis Follow-up of cultures Goals of care determination:  Advance care directives goals of care treatment plan discussed For now patient wants everything done which include CPR, intubation and ventilator if the need arises CODE STATUS: Full code Time spent discussing advanced care planning: 16 minutes

## 2018-11-08 NOTE — Progress Notes (Signed)
[place] at [place] NAME: Sheri Calderon    MR#:  [id number]  DATE OF BIRTH:  [birth year]  SUBJECTIVE:  CHIEF COMPLAINT:   Chief Complaint  Patient presents with  . Abdominal Pain  Patient seen and evaluated today Has decreased lower abdominal discomfort and lower back pain Has some dysuria Generalized weakness  REVIEW OF SYSTEMS:    ROS  CONSTITUTIONAL: No documented fever. Has fatigue, weakness. No weight gain, no weight loss.  EYES: No blurry or double vision.  ENT: No tinnitus. No postnasal drip. No redness of the oropharynx.  RESPIRATORY: No cough, no wheeze, no hemoptysis. No dyspnea.  CARDIOVASCULAR: No chest pain. No orthopnea. No palpitations. No syncope.  GASTROINTESTINAL: No nausea, no vomiting or diarrhea. No abdominal pain. No melena or hematochezia.  GENITOURINARY: Has dysuria , no hematuria.  ENDOCRINE: No polyuria or nocturia. No heat or cold intolerance.  HEMATOLOGY: No anemia. No bruising. No bleeding.  INTEGUMENTARY: No rashes. No lesions.  MUSCULOSKELETAL: No arthritis. No swelling. No gout.  NEUROLOGIC: No numbness, tingling, or ataxia. No seizure-type activity.  PSYCHIATRIC: No anxiety. No insomnia. No ADD.   DRUG ALLERGIES:   Allergies  Allergen Reactions  . Lorazepam Hives and Swelling    Patient reports receiving lorazepam intensol in the hospital and experienced swelling with hives.  . Gabapentin Rash    VITALS:  Blood pressure 139/81, pulse (!) 106, temperature 98.5 F (36.9 C), temperature source Oral, resp. rate 18, height 5' 7"  (1.702 m), weight 94.8 kg, last menstrual period 10/12/2012, SpO2 100 %.  PHYSICAL EXAMINATION:   Physical Exam  GENERAL:  53 y.o.-year-old patient lying in the bed with no acute distress.  EYES: Pupils equal, round, reactive to light and accommodation. No scleral icterus. Extraocular muscles intact.  HEENT: Head atraumatic, normocephalic. Oropharynx and nasopharynx clear.   NECK:  Supple, no jugular venous distention. No thyroid enlargement, no tenderness.  LUNGS: Normal breath sounds bilaterally, no wheezing, rales, rhonchi. No use of accessory muscles of respiration.  CARDIOVASCULAR: S1, S2 tachycardia noted. No murmurs, rubs, or gallops.  ABDOMEN: Soft, nontender, nondistended. Bowel sounds present. No organomegaly or mass.  EXTREMITIES: No cyanosis, clubbing or edema b/l.    NEUROLOGIC: Cranial nerves II through XII are intact. No focal Motor or sensory deficits b/l.   PSYCHIATRIC: The patient is alert and oriented x 3.  SKIN: No obvious rash, lesion, or ulcer.   LABORATORY PANEL:   CBC Recent Labs  Lab 11/08/18 0444  WBC 9.4  HGB 11.2*  HCT 36.4  PLT 297   ------------------------------------------------------------------------------------------------------------------ Chemistries  Recent Labs  Lab 11/07/18 1544 11/08/18 0444  NA 138 138  K 3.9 3.6  CL 100 104  CO2 26 28  GLUCOSE 111* 106*  BUN 12 10  CREATININE 0.69 0.74  CALCIUM 9.6 8.3*  MG  --  1.9  AST 25  --   ALT 30  --   ALKPHOS 65  --   BILITOT 0.9  --    ------------------------------------------------------------------------------------------------------------------  Cardiac Enzymes No results for input(s): TROPONINI in the last 168 hours. ------------------------------------------------------------------------------------------------------------------  RADIOLOGY:  Ct Abdomen Pelvis W Contrast  Result Date: 11/07/2018 CLINICAL DATA:  Lower abdominal pain radiating to the back. EXAM: CT ABDOMEN AND PELVIS WITH CONTRAST TECHNIQUE: Multidetector CT imaging of the abdomen and pelvis was performed using the standard protocol following bolus administration of intravenous contrast. CONTRAST:  183m OMNIPAQUE IOHEXOL 300 MG/ML  SOLN COMPARISON:  May 13, 2018 FINDINGS: Lower chest:  No acute abnormality. Hepatobiliary: No focal liver abnormality is seen. No gallstones,  gallbladder wall thickening, or biliary dilatation. Pancreas: Unremarkable. No pancreatic ductal dilatation or surrounding inflammatory changes. Spleen: Normal in size without focal abnormality. Adrenals/Urinary Tract: Normal adrenal glands. Right parapelvic cysts. Duplicated left collecting system. Diffuse periureteral stranding around the left ureters, 1 of which is mildly diffusely dilated to the level of the vesicoureteral junction. No renal or ureteral calculi seen. Hyperenhancement of the left ureters. Stomach/Bowel: Stomach is within normal limits. Post appendectomy. Enterotomy line at the level of the sigmoid colon without complicating features. No evidence of bowel wall thickening, distention, or inflammatory changes. Vascular/Lymphatic: No significant vascular findings are present. No enlarged abdominal or pelvic lymph nodes. Reproductive: Uterus and bilateral adnexa are unremarkable. Other: No abdominal wall hernia or abnormality. No abdominopelvic ascites. Musculoskeletal: Posterior facet arthropathy in the lower lumbosacral spine. IMPRESSION: 1. Duplicated left collecting system. 2. Diffuse periureteral stranding around and hyperenhancement of the left ureters. The lower moiety ureter is diffusely dilated to the level of the vesicoureteral junction. No renal or ureteral calculi seen. These findings may be due to infectious/inflammatory changes or potentially a recently passed stone. 3. No evidence of small-bowel obstruction. 4. Enterotomy line at the level of the sigmoid colon without complicating features. 5. Posterior facet arthropathy in the lower lumbosacral spine. Electronically Signed   By: Fidela Salisbury M.D.   On: 11/07/2018 17:15     ASSESSMENT AND PLAN:   53 year old female patient with history of colon cancer, colectomy, chemo chemotherapy and radiation currently under hospitalist service for UTI and sepsis  -Acute sepsis secondary to UTI Continue IV antibiotics Follow-up  lactic acid level Follow cultures  -Acute pyelonephritis IV antibiotics and fluids IV ceftriaxone antibiotic  -Hypertension Controlled blood pressure with as needed hydralazine and home blood pressure medications  -DVT prophylaxis subcu Lovenox daily  -History of colon cancer Supportive care Status post colectomy and chemoradiation past  All the records are reviewed and case discussed with Care Management/Social Worker. Management plans discussed with the patient, family and they are in agreement.  CODE STATUS: Full code  DVT Prophylaxis: SCDs  TOTAL TIME TAKING CARE OF THIS PATIENT: 37 minutes.   POSSIBLE D/C IN 1 to 2 DAYS, DEPENDING ON CLINICAL CONDITION.  Saundra Shelling M.D on 11/08/2018 at 10:46 AM  Between 7am to 6pm - Pager - [phone]  After 6pm go to [email or web address] - password [email or web address] [facility]  Office  [phone]  CC: Primary care physician; Alvester Chou, NP  Note: This dictation was prepared with Dragon dictation along with smaller phrase technology. Any transcriptional errors that result from this process are unintentional.

## 2018-11-09 LAB — HIV ANTIBODY (ROUTINE TESTING W REFLEX): HIV Screen 4th Generation wRfx: NONREACTIVE

## 2018-11-09 MED ORDER — CEPHALEXIN 500 MG PO CAPS: 500.0000 mg | ORAL_CAPSULE | Freq: Two times a day (BID) | ORAL | 0 refills | Status: AC

## 2018-11-09 MED ORDER — CYCLOBENZAPRINE HCL 10 MG PO TABS
10.0000 mg | ORAL_TABLET | Freq: Once | ORAL | Status: AC
  Administered 2018-11-09: 10 mg via ORAL
  Filled 2018-11-09: qty 1

## 2018-11-09 MED ORDER — ONDANSETRON 4 MG PO TBDP
4.0000 mg | ORAL_TABLET | Freq: Four times a day (QID) | ORAL | Status: DC | PRN
  Administered 2018-11-09: 4 mg via ORAL
  Filled 2018-11-09: qty 1

## 2018-11-09 MED ORDER — ONDANSETRON HCL 4 MG/2ML IJ SOLN: 4.0000 mg | Freq: Four times a day (QID) | INTRAMUSCULAR | Status: DC | PRN

## 2018-11-09 MED ORDER — LISINOPRIL 10 MG PO TABS: 10.0000 mg | ORAL_TABLET | Freq: Once | ORAL | Status: DC

## 2018-11-09 MED ORDER — SIMETHICONE 80 MG PO CHEW: 80.0000 mg | CHEWABLE_TABLET | Freq: Four times a day (QID) | ORAL | 0 refills | Status: DC | PRN

## 2018-11-09 MED ORDER — SIMETHICONE 80 MG PO CHEW
80.0000 mg | CHEWABLE_TABLET | Freq: Four times a day (QID) | ORAL | Status: DC | PRN
  Administered 2018-11-09: 80 mg via ORAL
  Filled 2018-11-09 (×2): qty 1

## 2018-11-09 NOTE — Progress Notes (Signed)
Patient requested IV to be discontinued and removed.  RN informed on call hospitalist of request.  IV removed as requested by patient.  Patient understands if IV medication is needed she will have to have it replaced.

## 2018-11-09 NOTE — Discharge Summary (Signed)
[place] [place] NAME: Sheri Calderon    MR#:  [id number]  DATE OF BIRTH:  03-21-1965  DATE OF ADMISSION:  11/07/2018 ADMITTING PHYSICIAN: Otila Back, MD  DATE OF DISCHARGE: 11/09/2018  PRIMARY CARE PHYSICIAN: Alvester Chou, NP   ADMISSION DIAGNOSIS:  Pyelonephritis [N12]  DISCHARGE DIAGNOSIS:  Active Problems:   Sepsis ([place]) Acute pyelonephritis Hypotension History of colon cancer  SECONDARY DIAGNOSIS:   Past Medical History:  Diagnosis Date  . Allergic rhinitis   . Anxiety   . Atrial fibrillation ([place])    ~ 06/2010 single episdose, evaluted at [facility] with testing. No reccurence. Does not see cardiologist (08/01/2018)  . Colon cancer [facility]) 2014   cancer of the sigmoid colon  . Depression   . Diverticulitis    "this is my 2nd time in hospital w/this in the last 2 wk" (06/21/2012)  . Dysrhythmia    1 time issue with A. Fib. In the hospital for 1 week. Spontaneously convereted to NSR.  Marland Kitchen GERD (gastroesophageal reflux disease)   . Gout   . Hypertension   . Lower extremity edema   . Migraines   . Osteoarthritis   . Peripheral neuropathy   . PTSD (post-traumatic stress disorder)   . Restless legs syndrome   . S/P chemotherapy, time since less than 4 weeks   . Tremor, essential      ADMITTING HISTORY Sheri Calderon  is a 53 y.o. female with a known history of colon cancer status post resection in 2014 with radiation and chemotherapy, depression, peripheral neuropathy, restless leg syndrome and hypertension who presented to the emergency room with complaints of lower abdominal pains and dysuria which is been going on for the last 4 days.  Denies any fevers.  Had some nausea and vomiting today.  No diarrhea.  Patient was evaluated in the emergency room and found to be tachycardic with heart rate of 104.  Evidence of leukocytosis with white count of 14,000.  Urinalysis with findings of urinary tract infection.  CT scan of the abdomen  and pelvis done reviewed duplicated left collecting system. Diffuse periureteral stranding around and hyperenhancement of the left ureters. The lower moiety ureter is diffusely dilated to the level of the vesicoureteral junction. No renal or ureteral calculi seen. These findings may be due to infectious/inflammatory changes or potentially a recently passed stone.  Patient was diagnosed with sepsis secondary to urinary tract infection with concern for pyelonephritis.  Was given a dose of IV Rocephin.  Medical service called to admit patient for further evaluation and management.  HOSPITAL COURSE:  Patient was septic at the time of presentation with tachycardia and elevated WBC count.  She was worked up with CT abdomen which showed pyelonephritis she was started on IV Rocephin antibiotic.  She tolerated antibiotics well her nausea improved.  Tachycardia also improved leukocytosis also improved.  Tolerated diet well.. CT abdomen did not show any renal stone or ureteral stone.  Patient tolerated IV antibiotics well.  She also had some flatulence for which she received simethicone.  Patient will be discharged home on oral Keflex antibiotic.  HIV test was nonreactive and COVID-19 test was negative.  Blood cultures did not reveal any growth.  CONSULTS OBTAINED:    DRUG ALLERGIES:   Allergies  Allergen Reactions  . Lorazepam Hives and Swelling    Patient reports receiving lorazepam intensol in the hospital and experienced swelling with hives.  . Gabapentin Rash    DISCHARGE  MEDICATIONS:   Allergies as of 11/09/2018      Reactions   Lorazepam Hives, Swelling   Patient reports receiving lorazepam intensol in the hospital and experienced swelling with hives.   Gabapentin Rash      Medication List    STOP taking these medications   oxyCODONE 5 MG immediate release tablet Commonly known as: Oxy IR/ROXICODONE     TAKE these medications   acetaminophen 500 MG tablet Commonly known as:  TYLENOL Take 2,000-3,000 mg by mouth 2 (two) times daily as needed for moderate pain or headache.   ALPRAZolam 0.5 MG tablet Commonly known as: XANAX Take 0.5 mg by mouth 3 (three) times daily as needed for anxiety.   cephALEXin 500 MG capsule Commonly known as: KEFLEX Take 1 capsule (500 mg total) by mouth 2 (two) times daily for 4 days.   Cymbalta 60 MG capsule Generic drug: DULoxetine Take 60 mg by mouth daily.   fluticasone 50 MCG/ACT nasal spray Commonly known as: FLONASE Place 1 spray into both nostrils daily as needed for allergies or rhinitis.   lisinopril 10 MG tablet Commonly known as: ZESTRIL Take 10 mg by mouth daily.   omeprazole 20 MG capsule Commonly known as: PRILOSEC Take 20 mg by mouth daily.   ondansetron 8 MG tablet Commonly known as: ZOFRAN Take 8 mg by mouth every 8 (eight) hours as needed for nausea or vomiting.   rizatriptan 10 MG tablet Commonly known as: MAXALT Take 10 mg by mouth See admin instructions. Take 10 mg by mouth at onset of headache. Repeat in 2 hours if needed. Maximum 2 tablets in 24 hours.   simethicone 80 MG chewable tablet Commonly known as: MYLICON Chew 1 tablet (80 mg total) by mouth 4 (four) times daily as needed for flatulence.       Today  Patient seen and evaluated today Tolerating diet okay No fever No abdominal pain Hemodynamically stable VITAL SIGNS:  Blood pressure (!) 155/86, pulse 94, temperature 98.9 F (37.2 C), temperature source Oral, resp. rate 20, height 5' 7"  (1.702 m), weight 94.8 kg, last menstrual period 10/12/2012, SpO2 97 %.  I/O:    Intake/Output Summary (Last 24 hours) at 11/09/2018 1148 Last data filed at 11/09/2018 0546 Gross per 24 hour  Intake 3142.4 ml  Output 2100 ml  Net 1042.4 ml    PHYSICAL EXAMINATION:  Physical Exam  GENERAL:  53 y.o.-year-old patient lying in the bed with no acute distress.  LUNGS: Normal breath sounds bilaterally, no wheezing, rales,rhonchi or crepitation.  No use of accessory muscles of respiration.  CARDIOVASCULAR: S1, S2 normal. No murmurs, rubs, or gallops.  ABDOMEN: Soft, non-tender, non-distended. Bowel sounds present. No organomegaly or mass.  NEUROLOGIC: Moves all 4 extremities. PSYCHIATRIC: The patient is alert and oriented x 3.  SKIN: No obvious rash, lesion, or ulcer.   DATA REVIEW:   CBC Recent Labs  Lab 11/08/18 0444  WBC 9.4  HGB 11.2*  HCT 36.4  PLT 297    Chemistries  Recent Labs  Lab 11/07/18 1544 11/08/18 0444  NA 138 138  K 3.9 3.6  CL 100 104  CO2 26 28  GLUCOSE 111* 106*  BUN 12 10  CREATININE 0.69 0.74  CALCIUM 9.6 8.3*  MG  --  1.9  AST 25  --   ALT 30  --   ALKPHOS 65  --   BILITOT 0.9  --     Cardiac Enzymes No results for input(s): TROPONINI in the last  168 hours.  Microbiology Results  Results for orders placed or performed during the hospital encounter of 11/07/18  Urine culture     Status: Abnormal   Collection Time: 11/07/18  3:44 PM   Specimen: Urine, Random  Result Value Ref Range Status   Specimen Description   Final    URINE, RANDOM Performed at [facility], [address] [place], [place] [address]    Special Requests   Final    NONE Performed at [facility], [address] [place], [place] [address]    Culture MULTIPLE SPECIES PRESENT, SUGGEST RECOLLECTION (A)  Final   Report Status 11/08/2018 FINAL  Final  Blood culture (routine x 2)     Status: None (Preliminary result)   Collection Time: 11/07/18  4:50 PM   Specimen: BLOOD  Result Value Ref Range Status   Specimen Description BLOOD LEFT ANTECUBITAL  Final   Special Requests   Final    BOTTLES DRAWN AEROBIC AND ANAEROBIC Blood Culture adequate volume   Culture   Final    NO GROWTH 2 DAYS Performed at [facility], [address] [place], [place] [address]    Report Status PENDING  Incomplete  Blood culture (routine x 2)     Status: None (Preliminary result)   Collection Time:  11/07/18  4:50 PM   Specimen: BLOOD  Result Value Ref Range Status   Specimen Description BLOOD RIGHT ANTECUBITAL  Final   Special Requests   Final    BOTTLES DRAWN AEROBIC AND ANAEROBIC Blood Culture adequate volume   Culture   Final    NO GROWTH 2 DAYS Performed at [facility], [address] [place], [place] [address]    Report Status PENDING  Incomplete  SARS Coronavirus 2 [facility] order, Performed in [place] hospital lab) Nasopharyngeal Nasopharyngeal Swab     Status: None   Collection Time: 11/07/18  6:20 PM   Specimen: Nasopharyngeal Swab  Result Value Ref Range Status   SARS Coronavirus 2 NEGATIVE NEGATIVE Final    Comment: (NOTE) If result is NEGATIVE SARS-CoV-2 target nucleic acids are NOT DETECTED. The SARS-CoV-2 RNA is generally detectable in upper and lower  respiratory specimens during the acute phase of infection. The lowest  concentration of SARS-CoV-2 viral copies this assay can detect is 250  copies / mL. A negative result does not preclude SARS-CoV-2 infection  and should not be used as the sole basis for treatment or other  patient management decisions.  A negative result may occur with  improper specimen collection / handling, submission of specimen other  than nasopharyngeal swab, presence of viral mutation(s) within the  areas targeted by this assay, and inadequate number of viral copies  (<250 copies / mL). A negative result must be combined with clinical  observations, patient history, and epidemiological information. If result is POSITIVE SARS-CoV-2 target nucleic acids are DETECTED. The SARS-CoV-2 RNA is generally detectable in upper and lower  respiratory specimens dur ing the acute phase of infection.  Positive  results are indicative of active infection with SARS-CoV-2.  Clinical  correlation with patient history and other diagnostic information is  necessary to determine patient infection status.  Positive results do  not rule out  bacterial infection or co-infection with other viruses. If result is PRESUMPTIVE POSTIVE SARS-CoV-2 nucleic acids MAY BE PRESENT.   A presumptive positive result was obtained on the submitted specimen  and confirmed on repeat testing.  While 2019 novel coronavirus  (SARS-CoV-2) nucleic  acids may be present in the submitted sample  additional confirmatory testing may be necessary for epidemiological  and / or clinical management purposes  to differentiate between  SARS-CoV-2 and other Sarbecovirus currently known to infect humans.  If clinically indicated additional testing with an alternate test  methodology [phone]) is advised. The SARS-CoV-2 RNA is generally  detectable in upper and lower respiratory sp ecimens during the acute  phase of infection. The expected result is Negative. Fact Sheet for Patients:  StrictlyIdeas.no Fact Sheet for Healthcare Providers: BankingDealers.co.za This test is not yet approved or cleared by the [place] FDA and has been authorized for detection and/or diagnosis of SARS-CoV-2 by FDA under an Emergency Use Authorization (EUA).  This EUA will remain in effect (meaning this test can be used) for the duration of the COVID-19 declaration under Section 564(b)(1) of the Act, 21 U.S.C. section 360bbb-3(b)(1), unless the authorization is terminated or revoked sooner. Performed at [facility], [address] [place], [place] [address]     RADIOLOGY:  Ct Abdomen Pelvis W Contrast  Result Date: 11/07/2018 CLINICAL DATA:  Lower abdominal pain radiating to the back. EXAM: CT ABDOMEN AND PELVIS WITH CONTRAST TECHNIQUE: Multidetector CT imaging of the abdomen and pelvis was performed using the standard protocol following bolus administration of intravenous contrast. CONTRAST:  123m OMNIPAQUE IOHEXOL 300 MG/ML  SOLN COMPARISON:  May 13, 2018 FINDINGS: Lower chest: No acute abnormality. Hepatobiliary:  No focal liver abnormality is seen. No gallstones, gallbladder wall thickening, or biliary dilatation. Pancreas: Unremarkable. No pancreatic ductal dilatation or surrounding inflammatory changes. Spleen: Normal in size without focal abnormality. Adrenals/Urinary Tract: Normal adrenal glands. Right parapelvic cysts. Duplicated left collecting system. Diffuse periureteral stranding around the left ureters, 1 of which is mildly diffusely dilated to the level of the vesicoureteral junction. No renal or ureteral calculi seen. Hyperenhancement of the left ureters. Stomach/Bowel: Stomach is within normal limits. Post appendectomy. Enterotomy line at the level of the sigmoid colon without complicating features. No evidence of bowel wall thickening, distention, or inflammatory changes. Vascular/Lymphatic: No significant vascular findings are present. No enlarged abdominal or pelvic lymph nodes. Reproductive: Uterus and bilateral adnexa are unremarkable. Other: No abdominal wall hernia or abnormality. No abdominopelvic ascites. Musculoskeletal: Posterior facet arthropathy in the lower lumbosacral spine. IMPRESSION: 1. Duplicated left collecting system. 2. Diffuse periureteral stranding around and hyperenhancement of the left ureters. The lower moiety ureter is diffusely dilated to the level of the vesicoureteral junction. No renal or ureteral calculi seen. These findings may be due to infectious/inflammatory changes or potentially a recently passed stone. 3. No evidence of small-bowel obstruction. 4. Enterotomy line at the level of the sigmoid colon without complicating features. 5. Posterior facet arthropathy in the lower lumbosacral spine. Electronically Signed   By: DFidela SalisburyM.D.   On: 11/07/2018 17:15    Follow up with PCP in 1 week.  Management plans discussed with the patient, family and they are in agreement.  CODE STATUS: Full code    Code Status Orders  (From admission, onward)         Start      Ordered   11/07/18 1901  Full code  Continuous     11/07/18 1901        Code Status History    Date Active Date Inactive Code Status Order ID Comments User Context   08/03/2018 1429 08/04/2018 1202 Full Code [id number] BCoralie Keens MD Inpatient   05/13/2018 2343 05/15/2018 0652 Full Code [id number]  Harrie Foreman, MD Inpatient   07/30/2017 0107 08/01/2017 2010 Full Code [id number]  Harrie Foreman, MD ED   06/28/2012 1233 07/04/2012 1222 Full Code [id number]  Harl Bowie, MD Inpatient   06/21/2012 1803 06/28/2012 1233 Full Code [id number]  Ralene Ok, MD Inpatient   06/08/2012 0244 06/10/2012 1818 Full Code [id number]  Bynum Bellows, MD Inpatient   Advance Care Planning Activity      TOTAL TIME TAKING CARE OF THIS PATIENT ON DAY OF DISCHARGE: more than 35 minutes.   Saundra Shelling M.D on 11/09/2018 at 11:48 AM  Between 7am to 6pm - Pager - [phone]  After 6pm go to [email or web address] - password [email or web address] [facility]  Office  [phone]  CC: Primary care physician; Alvester Chou, NP  Note: This dictation was prepared with Dragon dictation along with smaller phrase technology. Any transcriptional errors that result from this process are unintentional.

## 2018-11-12 LAB — CULTURE, BLOOD (ROUTINE X 2)
Culture: NO GROWTH
Culture: NO GROWTH
Special Requests: ADEQUATE
Special Requests: ADEQUATE

## 2018-11-16 ENCOUNTER — Inpatient Hospital Stay: Payer: Medicare Other | Admitting: Primary Care

## 2019-01-12 ENCOUNTER — Other Ambulatory Visit: Payer: Self-pay | Admitting: Adult Health

## 2019-01-12 ENCOUNTER — Ambulatory Visit
Admission: RE | Admit: 2019-01-12 | Discharge: 2019-01-12 | Disposition: A | Discharge status: Home / Self Care | Payer: Medicare Other | Source: Ambulatory Visit | Attending: Endocrinology | Admitting: Endocrinology

## 2019-01-12 ENCOUNTER — Other Ambulatory Visit: Payer: Self-pay

## 2019-01-12 DIAGNOSIS — M545 Low back pain, unspecified: Secondary | ICD-10-CM

## 2019-01-12 DIAGNOSIS — M25551 Pain in right hip: Secondary | ICD-10-CM

## 2019-01-20 ENCOUNTER — Encounter (HOSPITAL_COMMUNITY): Payer: Self-pay | Admitting: Emergency Medicine

## 2019-01-20 ENCOUNTER — Emergency Department (HOSPITAL_COMMUNITY)
Admission: EM | Admit: 2019-01-20 | Discharge: 2019-01-21 | Disposition: A | Discharge status: Home / Self Care | Payer: Commercial Managed Care - PPO | Attending: Emergency Medicine | Admitting: Emergency Medicine

## 2019-01-20 DIAGNOSIS — Z5321 Procedure and treatment not carried out due to patient leaving prior to being seen by health care provider: Secondary | ICD-10-CM | POA: Diagnosis not present

## 2019-01-20 DIAGNOSIS — R109 Unspecified abdominal pain: Secondary | ICD-10-CM | POA: Insufficient documentation

## 2019-01-20 NOTE — ED Triage Notes (Signed)
Arrives via EMS from home, abd pain since Tuesday, hx of colon CA. Endorses N/V but denies diarrhea.

## 2019-01-21 LAB — CBC
HCT: 44 % (ref 36.0–46.0)
Hemoglobin: 14 g/dL (ref 12.0–15.0)
MCH: 28.2 pg (ref 26.0–34.0)
MCHC: 31.8 g/dL (ref 30.0–36.0)
MCV: 88.7 fL (ref 80.0–100.0)
Platelets: 365 10*3/uL (ref 150–400)
RBC: 4.96 MIL/uL (ref 3.87–5.11)
RDW: 13.4 % (ref 11.5–15.5)
WBC: 8.3 10*3/uL (ref 4.0–10.5)
nRBC: 0 % (ref 0.0–0.2)

## 2019-04-07 ENCOUNTER — Inpatient Hospital Stay: Payer: Commercial Managed Care - PPO

## 2019-04-07 ENCOUNTER — Other Ambulatory Visit: Payer: Self-pay

## 2019-04-07 ENCOUNTER — Telehealth: Payer: Self-pay | Admitting: *Deleted

## 2019-04-07 ENCOUNTER — Inpatient Hospital Stay: Payer: Commercial Managed Care - PPO | Attending: Hematology & Oncology | Admitting: Hematology & Oncology

## 2019-04-07 ENCOUNTER — Encounter: Payer: Self-pay | Admitting: Hematology & Oncology

## 2019-04-07 VITALS — BP 182/102 | HR 92 | Temp 97.1°F | Resp 20 | Wt 218.0 lb

## 2019-04-07 DIAGNOSIS — M549 Dorsalgia, unspecified: Secondary | ICD-10-CM | POA: Diagnosis not present

## 2019-04-07 DIAGNOSIS — Z79899 Other long term (current) drug therapy: Secondary | ICD-10-CM | POA: Diagnosis not present

## 2019-04-07 DIAGNOSIS — M545 Low back pain: Secondary | ICD-10-CM | POA: Diagnosis not present

## 2019-04-07 DIAGNOSIS — Z923 Personal history of irradiation: Secondary | ICD-10-CM | POA: Insufficient documentation

## 2019-04-07 DIAGNOSIS — R109 Unspecified abdominal pain: Secondary | ICD-10-CM | POA: Insufficient documentation

## 2019-04-07 DIAGNOSIS — C187 Malignant neoplasm of sigmoid colon: Secondary | ICD-10-CM

## 2019-04-07 DIAGNOSIS — R200 Anesthesia of skin: Secondary | ICD-10-CM | POA: Diagnosis not present

## 2019-04-07 DIAGNOSIS — Z9221 Personal history of antineoplastic chemotherapy: Secondary | ICD-10-CM | POA: Insufficient documentation

## 2019-04-07 DIAGNOSIS — Z888 Allergy status to other drugs, medicaments and biological substances status: Secondary | ICD-10-CM | POA: Diagnosis not present

## 2019-04-07 LAB — CMP (CANCER CENTER ONLY)
ALT: 23 U/L (ref 0–44)
AST: 16 U/L (ref 15–41)
Albumin: 4.3 g/dL (ref 3.5–5.0)
Alkaline Phosphatase: 60 U/L (ref 38–126)
Anion gap: 8 (ref 5–15)
BUN: 14 mg/dL (ref 6–20)
CO2: 33 mmol/L — ABNORMAL HIGH (ref 22–32)
Calcium: 9.5 mg/dL (ref 8.9–10.3)
Chloride: 101 mmol/L (ref 98–111)
Creatinine: 0.94 mg/dL (ref 0.44–1.00)
GFR, Est AFR Am: >60 mL/min (ref >60)
GFR, Estimated: >60 mL/min (ref >60)
Glucose, Bld: 135 mg/dL — ABNORMAL HIGH (ref 70–99)
Potassium: 3.5 mmol/L (ref 3.5–5.1)
Sodium: 142 mmol/L (ref 135–145)
Total Bilirubin: 0.4 mg/dL (ref 0.3–1.2)
Total Protein: 7 g/dL (ref 6.5–8.1)

## 2019-04-07 LAB — CBC WITH DIFFERENTIAL (CANCER CENTER ONLY)
Abs Immature Granulocytes: 0.03 10*3/uL (ref 0.00–0.07)
Basophils Absolute: 0.1 10*3/uL (ref 0.0–0.1)
Basophils Relative: 1 %
Eosinophils Absolute: 0.3 10*3/uL (ref 0.0–0.5)
Eosinophils Relative: 4 %
HCT: 40.1 % (ref 36.0–46.0)
Hemoglobin: 13.1 g/dL (ref 12.0–15.0)
Immature Granulocytes: 0 %
Lymphocytes Relative: 22 %
Lymphs Abs: 1.7 10*3/uL (ref 0.7–4.0)
MCH: 28.3 pg (ref 26.0–34.0)
MCHC: 32.7 g/dL (ref 30.0–36.0)
MCV: 86.6 fL (ref 80.0–100.0)
Monocytes Absolute: 0.4 10*3/uL (ref 0.1–1.0)
Monocytes Relative: 5 %
Neutro Abs: 5.3 10*3/uL (ref 1.7–7.7)
Neutrophils Relative %: 68 %
Platelet Count: 347 10*3/uL (ref 150–400)
RBC: 4.63 MIL/uL (ref 3.87–5.11)
RDW: 12.6 % (ref 11.5–15.5)
WBC Count: 7.6 10*3/uL (ref 4.0–10.5)
nRBC: 0 % (ref 0.0–0.2)

## 2019-04-07 NOTE — Progress Notes (Signed)
Hematology and Oncology Follow Up Visit  Sheri Calderon [id number] 12-26-1965 53 y.o. 04/07/2019   Principle Diagnosis:   Stage IIIC (T4N2M0) adenocarcinoma of the sigmoid colon --treated with chemotherapy and radiation therapy in 2014  Current Therapy:    Observation     Interim History:  Sheri Calderon is back for a long awaited visit.  We last saw her back in September 2018.  We saw her back at that time, she was having abdominal pain.  The work-up was pretty much unremarkable.  Her problem now is that she is having severe lower back pain.  She is seeing Dr. Sherwood Gambler [facility] [place] [facility].  It sounds like he might be planning for a spinal fusion.  Looks like she may have spondylolisthesis.  He wanted her to have oncologic clearance.  Overall, she is doing well otherwise.  There is been no evidence at all of her colon cancer coming back.  It is now been at least 6 years or longer.  I would think that in her situation, the colon cancer would have come back sooner.  There is been no problems with her bowels or bladder.  She has had no further issues with abdominal pain.   I forgot to mention that she had hernia surgery back last year.  She had no problems with hernia surgery.  There is no bleeding.  She really cannot work because of the back.  She has some numbness in her legs which probably is from her spine.  There is no fever.  There is no cough.  She has had no problems with weight loss or weight gain.  She is up-to-date with her mammograms.  Overall, her performance status is ECOG 1.  Medications:  Current Outpatient Medications:  .  acetaminophen (TYLENOL) 500 MG tablet, Take 2,000-3,000 mg by mouth 2 (two) times daily as needed for moderate pain or headache., Disp: , Rfl:  .  ALPRAZolam (XANAX) 0.5 MG tablet, Take 0.5 mg by mouth 3 (three) times daily as needed for anxiety., Disp: , Rfl: 2 .  DULoxetine (CYMBALTA) 60 MG capsule, Take 60 mg by mouth daily. , Disp: , Rfl:  .   fluticasone (FLONASE) 50 MCG/ACT nasal spray, Place 1 spray into both nostrils daily as needed for allergies or rhinitis., Disp: , Rfl:  .  lisinopril (ZESTRIL) 10 MG tablet, Take 10 mg by mouth daily., Disp: , Rfl:  .  omeprazole (PRILOSEC) 20 MG capsule, Take 20 mg by mouth daily., Disp: , Rfl:  .  ondansetron (ZOFRAN) 8 MG tablet, Take 8 mg by mouth every 8 (eight) hours as needed for nausea or vomiting., Disp: , Rfl:  .  rizatriptan (MAXALT) 10 MG tablet, Take 10 mg by mouth See admin instructions. Take 10 mg by mouth at onset of headache. Repeat in 2 hours if needed. Maximum 2 tablets in 24 hours., Disp: , Rfl: 3 .  simethicone (MYLICON) 80 MG chewable tablet, Chew 1 tablet (80 mg total) by mouth 4 (four) times daily as needed for flatulence., Disp: 30 tablet, Rfl: 0  Allergies:  Allergies  Allergen Reactions  . Lorazepam Hives and Swelling    Patient reports receiving lorazepam intensol in the hospital and experienced swelling with hives.  . Gabapentin Rash    Past Medical History, Surgical history, Social history, and Family History were reviewed and updated.  Review of Systems: Review of Systems  Constitutional: Negative.   HENT:  Negative.   Eyes: Negative.   Respiratory: Negative.  Cardiovascular: Negative.   Gastrointestinal: Negative.   Endocrine: Negative.   Genitourinary: Negative.    Musculoskeletal: Positive for back pain.  Skin: Negative.   Neurological: Positive for numbness.  Hematological: Negative.   Psychiatric/Behavioral: Negative.     Physical Exam: Mildly obese white female in mild distress secondary to her lower back pain.  Her head and neck exam shows no scleral icterus.  There is no ocular or oral lesions.  There are no palpable cervical or supraclavicular lymph nodes.  Lungs are clear bilaterally.  Cardiac exam regular rate and rhythm with no murmurs, rubs or bruits.  Abdomen is soft and obese.  She has laparotomy scars.  She laparoscopy scars.  There  is no palpable liver.  There is no palpable spleen tip.  Back exam shows some tenderness in the lumbar spine to palpation.  Extremities shows no clubbing, cyanosis or edema.  She has good range of motion of her joints.  Skin exam shows no rashes, ecchymoses or petechia.  Neurological exam shows no obvious neurological deficits.    weight is 218 lb (98.9 kg). Her temporal temperature is 97.1 F (36.2 C) (abnormal). Her blood pressure is 182/102 (abnormal) and her pulse is 92. Her respiration is 20 and oxygen saturation is 97%.   Wt Readings from Last 3 Encounters:  04/07/19 218 lb (98.9 kg)  11/07/18 208 lb 15.9 oz (94.8 kg)  08/03/18 209 lb (94.8 kg)    Physical Exam See above  Lab Results  Component Value Date   WBC 7.6 04/07/2019   HGB 13.1 04/07/2019   HCT 40.1 04/07/2019   MCV 86.6 04/07/2019   PLT 347 04/07/2019     Chemistry      Component Value Date/Time   NA 142 04/07/2019 1040   NA 143 11/10/2016 1332   NA 141 11/25/2015 1249   K 3.5 04/07/2019 1040   K 4.5 11/10/2016 1332   K 4.5 11/25/2015 1249   CL 101 04/07/2019 1040   CL 103 11/10/2016 1332   CL 105 08/11/2012 0941   CO2 33 (H) 04/07/2019 1040   CO2 30 11/10/2016 1332   CO2 25 11/25/2015 1249   BUN 14 04/07/2019 1040   BUN 9 11/10/2016 1332   BUN 17.7 11/25/2015 1249   CREATININE 0.94 04/07/2019 1040   CREATININE 1.1 11/10/2016 1332   CREATININE 1.1 11/25/2015 1249      Component Value Date/Time   CALCIUM 9.5 04/07/2019 1040   CALCIUM 9.8 11/10/2016 1332   CALCIUM 9.4 11/25/2015 1249   ALKPHOS 60 04/07/2019 1040   ALKPHOS 66 11/10/2016 1332   ALKPHOS 77 11/25/2015 1249   AST 16 04/07/2019 1040   AST 15 11/25/2015 1249   ALT 23 04/07/2019 1040   ALT 38 11/10/2016 1332   ALT 26 11/25/2015 1249   BILITOT 0.4 04/07/2019 1040   BILITOT <0.30 11/25/2015 1249         Impression and Plan: Sheri Calderon is a 54 year old white female.  She had stage IIIc colon cancer 7 years ago.  She was treated with  chemo and radiation therapy.  She has been in remission.  I do not see any evidence of recurrent disease.  Of note, her last CEA level back in 2018 was 1.9.  I do not see any problems with her having spinal surgery if necessary.  This clearly will help her quality of life.  I reassured her from my perspective that things looked okay with respect to her colon cancer.  I  really hope that she will be able to have spinal surgery to make her life better so she can get back to a active lifestyle.  I spent about 35 minutes with her today.  Again have not seen her for 3 years.  I do not think we have to see her back unless there are problems with respect to a malignancy or bleeding.   Volanda Napoleon, MD 2/5/202112:43 PM

## 2019-04-07 NOTE — Telephone Encounter (Signed)
Called pt regarding todays visit. Pt confirmed she is coming to get clearance for surgery per Dr. Rita Ohara.  Requested pt bring Dr. Leda Gauze last progress note and surgical clearance paperwork. Discussed with pt she has not seen provider at our office since 2018 and MD may not be able to give clearance for a surgical procedure.  Message forward to MD

## 2019-08-07 ENCOUNTER — Ambulatory Visit: Payer: Commercial Managed Care - PPO | Admitting: Family Medicine

## 2019-08-07 DIAGNOSIS — Z0289 Encounter for other administrative examinations: Secondary | ICD-10-CM

## 2020-01-23 ENCOUNTER — Emergency Department (HOSPITAL_COMMUNITY)
Admission: EM | Admit: 2020-01-23 | Discharge: 2020-01-24 | Disposition: A | Discharge status: Other Acute Inpatient Hospital | Payer: Medicare Other | Source: Home / Self Care | Attending: Emergency Medicine | Admitting: Emergency Medicine

## 2020-01-23 ENCOUNTER — Encounter (HOSPITAL_COMMUNITY): Payer: Self-pay

## 2020-01-23 DIAGNOSIS — F19929 Other psychoactive substance use, unspecified with intoxication, unspecified: Secondary | ICD-10-CM | POA: Diagnosis present

## 2020-01-23 DIAGNOSIS — F312 Bipolar disorder, current episode manic severe with psychotic features: Secondary | ICD-10-CM | POA: Diagnosis not present

## 2020-01-23 DIAGNOSIS — F17210 Nicotine dependence, cigarettes, uncomplicated: Secondary | ICD-10-CM | POA: Insufficient documentation

## 2020-01-23 DIAGNOSIS — Z79899 Other long term (current) drug therapy: Secondary | ICD-10-CM | POA: Insufficient documentation

## 2020-01-23 DIAGNOSIS — F1910 Other psychoactive substance abuse, uncomplicated: Secondary | ICD-10-CM | POA: Diagnosis present

## 2020-01-23 DIAGNOSIS — F419 Anxiety disorder, unspecified: Secondary | ICD-10-CM | POA: Insufficient documentation

## 2020-01-23 DIAGNOSIS — F600 Paranoid personality disorder: Secondary | ICD-10-CM | POA: Insufficient documentation

## 2020-01-23 DIAGNOSIS — R45851 Suicidal ideations: Secondary | ICD-10-CM | POA: Insufficient documentation

## 2020-01-23 DIAGNOSIS — Z85038 Personal history of other malignant neoplasm of large intestine: Secondary | ICD-10-CM | POA: Insufficient documentation

## 2020-01-23 DIAGNOSIS — F329 Major depressive disorder, single episode, unspecified: Secondary | ICD-10-CM | POA: Insufficient documentation

## 2020-01-23 DIAGNOSIS — I10 Essential (primary) hypertension: Secondary | ICD-10-CM | POA: Insufficient documentation

## 2020-01-23 DIAGNOSIS — Z20822 Contact with and (suspected) exposure to covid-19: Secondary | ICD-10-CM | POA: Insufficient documentation

## 2020-01-23 LAB — RAPID URINE DRUG SCREEN, HOSP PERFORMED
Amphetamines: POSITIVE — AB
Barbiturates: NOT DETECTED
Benzodiazepines: POSITIVE — AB
Cocaine: POSITIVE — AB
Opiates: NOT DETECTED
Tetrahydrocannabinol: POSITIVE — AB

## 2020-01-23 LAB — RESP PANEL BY RT-PCR (FLU A&B, COVID) ARPGX2
Influenza A by PCR: NEGATIVE
Influenza B by PCR: NEGATIVE
SARS Coronavirus 2 by RT PCR: NEGATIVE

## 2020-01-23 LAB — CBC WITH DIFFERENTIAL/PLATELET
Abs Immature Granulocytes: 0.01 10*3/uL (ref 0.00–0.07)
Basophils Absolute: 0.1 10*3/uL (ref 0.0–0.1)
Basophils Relative: 1 %
Eosinophils Absolute: 0.1 10*3/uL (ref 0.0–0.5)
Eosinophils Relative: 1 %
HCT: 39 % (ref 36.0–46.0)
Hemoglobin: 12.9 g/dL (ref 12.0–15.0)
Immature Granulocytes: 0 %
Lymphocytes Relative: 26 %
Lymphs Abs: 2 10*3/uL (ref 0.7–4.0)
MCH: 28.9 pg (ref 26.0–34.0)
MCHC: 33.1 g/dL (ref 30.0–36.0)
MCV: 87.4 fL (ref 80.0–100.0)
Monocytes Absolute: 0.6 10*3/uL (ref 0.1–1.0)
Monocytes Relative: 8 %
Neutro Abs: 4.9 10*3/uL (ref 1.7–7.7)
Neutrophils Relative %: 64 %
Platelets: 416 10*3/uL — ABNORMAL HIGH (ref 150–400)
RBC: 4.46 MIL/uL (ref 3.87–5.11)
RDW: 12.7 % (ref 11.5–15.5)
WBC: 7.7 10*3/uL (ref 4.0–10.5)
nRBC: 0 % (ref 0.0–0.2)

## 2020-01-23 LAB — COMPREHENSIVE METABOLIC PANEL
ALT: 25 U/L (ref 0–44)
AST: 21 U/L (ref 15–41)
Albumin: 4.6 g/dL (ref 3.5–5.0)
Alkaline Phosphatase: 52 U/L (ref 38–126)
Anion gap: 11 (ref 5–15)
BUN: 18 mg/dL (ref 6–20)
CO2: 25 mmol/L (ref 22–32)
Calcium: 9.3 mg/dL (ref 8.9–10.3)
Chloride: 105 mmol/L (ref 98–111)
Creatinine, Ser: 0.89 mg/dL (ref 0.44–1.00)
GFR, Estimated: >60 mL/min (ref >60)
Glucose, Bld: 95 mg/dL (ref 70–99)
Potassium: 3 mmol/L — ABNORMAL LOW (ref 3.5–5.1)
Sodium: 141 mmol/L (ref 135–145)
Total Bilirubin: 0.8 mg/dL (ref 0.3–1.2)
Total Protein: 7.6 g/dL (ref 6.5–8.1)

## 2020-01-23 LAB — ACETAMINOPHEN LEVEL: Acetaminophen (Tylenol), Serum: <10 ug/mL — ABNORMAL LOW (ref 10–30)

## 2020-01-23 LAB — SALICYLATE LEVEL: Salicylate Lvl: <7 mg/dL — ABNORMAL LOW (ref 7.0–30.0)

## 2020-01-23 LAB — PREGNANCY, URINE: Preg Test, Ur: NEGATIVE

## 2020-01-23 LAB — ETHANOL: Alcohol, Ethyl (B): <10 mg/dL (ref <10)

## 2020-01-23 MED ORDER — LISINOPRIL 10 MG PO TABS
10.0000 mg | ORAL_TABLET | Freq: Every day | ORAL | Status: DC
  Administered 2020-01-23 – 2020-01-24 (×2): 10 mg via ORAL
  Filled 2020-01-23 (×2): qty 1

## 2020-01-23 MED ORDER — ALPRAZOLAM 0.5 MG PO TABS
0.5000 mg | ORAL_TABLET | Freq: Three times a day (TID) | ORAL | Status: DC | PRN
  Administered 2020-01-23 – 2020-01-24 (×2): 0.5 mg via ORAL
  Filled 2020-01-23 (×2): qty 1

## 2020-01-23 MED ORDER — POTASSIUM CHLORIDE CRYS ER 20 MEQ PO TBCR
40.0000 meq | EXTENDED_RELEASE_TABLET | Freq: Once | ORAL | Status: AC
  Administered 2020-01-23: 40 meq via ORAL
  Filled 2020-01-23: qty 2

## 2020-01-23 MED ORDER — PANTOPRAZOLE SODIUM 40 MG PO TBEC
40.0000 mg | DELAYED_RELEASE_TABLET | Freq: Every day | ORAL | Status: DC
  Administered 2020-01-23 – 2020-01-24 (×2): 40 mg via ORAL
  Filled 2020-01-23 (×2): qty 1

## 2020-01-23 MED ORDER — ACETAMINOPHEN 325 MG PO TABS: 650.0000 mg | ORAL_TABLET | Freq: Four times a day (QID) | ORAL | Status: DC | PRN

## 2020-01-23 MED ORDER — DULOXETINE HCL 30 MG PO CPEP
60.0000 mg | ORAL_CAPSULE | Freq: Every day | ORAL | Status: DC
  Administered 2020-01-23 – 2020-01-24 (×2): 60 mg via ORAL
  Filled 2020-01-23 (×2): qty 2

## 2020-01-23 MED ORDER — ONDANSETRON HCL 4 MG PO TABS: 8.0000 mg | ORAL_TABLET | Freq: Three times a day (TID) | ORAL | Status: DC | PRN
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
## 2020-01-23 NOTE — Care Management (Signed)
ED CM received call from liaison [place], patient is active with them.  Please contact [place] liaison for update concerning home and family dynamics [phone].
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
## 2020-01-23 NOTE — ED Notes (Signed)
Daughter, Saprina Chuong, left name and number in case you need to contact her, [phone].
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
## 2020-01-24 ENCOUNTER — Inpatient Hospital Stay (HOSPITAL_COMMUNITY)
Admission: AD | Admit: 2020-01-24 | Discharge: 2020-01-27 | DRG: 885 | Disposition: A | Discharge status: Home / Self Care | Payer: Medicare Other | Source: Intra-hospital | Attending: Emergency Medicine | Admitting: Emergency Medicine

## 2020-01-24 ENCOUNTER — Encounter (HOSPITAL_COMMUNITY): Payer: Self-pay | Admitting: Registered Nurse

## 2020-01-24 ENCOUNTER — Other Ambulatory Visit: Payer: Self-pay

## 2020-01-24 DIAGNOSIS — Z85038 Personal history of other malignant neoplasm of large intestine: Secondary | ICD-10-CM | POA: Diagnosis not present

## 2020-01-24 DIAGNOSIS — K219 Gastro-esophageal reflux disease without esophagitis: Secondary | ICD-10-CM | POA: Diagnosis present

## 2020-01-24 DIAGNOSIS — F4310 Post-traumatic stress disorder, unspecified: Secondary | ICD-10-CM | POA: Diagnosis present

## 2020-01-24 DIAGNOSIS — M109 Gout, unspecified: Secondary | ICD-10-CM | POA: Diagnosis present

## 2020-01-24 DIAGNOSIS — J309 Allergic rhinitis, unspecified: Secondary | ICD-10-CM | POA: Diagnosis present

## 2020-01-24 DIAGNOSIS — G2581 Restless legs syndrome: Secondary | ICD-10-CM | POA: Diagnosis present

## 2020-01-24 DIAGNOSIS — Z9049 Acquired absence of other specified parts of digestive tract: Secondary | ICD-10-CM | POA: Diagnosis not present

## 2020-01-24 DIAGNOSIS — F1910 Other psychoactive substance abuse, uncomplicated: Secondary | ICD-10-CM | POA: Diagnosis present

## 2020-01-24 DIAGNOSIS — R45851 Suicidal ideations: Secondary | ICD-10-CM | POA: Diagnosis present

## 2020-01-24 DIAGNOSIS — I4891 Unspecified atrial fibrillation: Principal | ICD-10-CM | POA: Diagnosis present

## 2020-01-24 DIAGNOSIS — Z20822 Contact with and (suspected) exposure to covid-19: Secondary | ICD-10-CM | POA: Diagnosis present

## 2020-01-24 DIAGNOSIS — Z79899 Other long term (current) drug therapy: Secondary | ICD-10-CM

## 2020-01-24 DIAGNOSIS — I10 Essential (primary) hypertension: Secondary | ICD-10-CM | POA: Diagnosis present

## 2020-01-24 DIAGNOSIS — F312 Bipolar disorder, current episode manic severe with psychotic features: Secondary | ICD-10-CM | POA: Diagnosis present

## 2020-01-24 DIAGNOSIS — Z888 Allergy status to other drugs, medicaments and biological substances status: Secondary | ICD-10-CM | POA: Diagnosis not present

## 2020-01-24 DIAGNOSIS — F411 Generalized anxiety disorder: Secondary | ICD-10-CM | POA: Diagnosis present

## 2020-01-24 DIAGNOSIS — G629 Polyneuropathy, unspecified: Secondary | ICD-10-CM | POA: Diagnosis present

## 2020-01-24 DIAGNOSIS — F19929 Other psychoactive substance use, unspecified with intoxication, unspecified: Secondary | ICD-10-CM | POA: Diagnosis present

## 2020-01-24 DIAGNOSIS — E876 Hypokalemia: Secondary | ICD-10-CM | POA: Diagnosis present

## 2020-01-24 DIAGNOSIS — Z9221 Personal history of antineoplastic chemotherapy: Secondary | ICD-10-CM

## 2020-01-24 DIAGNOSIS — F17210 Nicotine dependence, cigarettes, uncomplicated: Secondary | ICD-10-CM | POA: Diagnosis present

## 2020-01-24 DIAGNOSIS — F19959 Other psychoactive substance use, unspecified with psychoactive substance-induced psychotic disorder, unspecified: Secondary | ICD-10-CM | POA: Diagnosis present

## 2020-01-24 DIAGNOSIS — F1994 Other psychoactive substance use, unspecified with psychoactive substance-induced mood disorder: Secondary | ICD-10-CM | POA: Diagnosis present

## 2020-01-24 MED ORDER — LISINOPRIL 10 MG PO TABS
10.0000 mg | ORAL_TABLET | Freq: Every day | ORAL | Status: DC
  Administered 2020-01-26 – 2020-01-27 (×2): 10 mg via ORAL
  Filled 2020-01-24 (×3): qty 1
  Filled 2020-01-24: qty 2
  Filled 2020-01-24 (×2): qty 1

## 2020-01-24 MED ORDER — ALUM & MAG HYDROXIDE-SIMETH 200-200-20 MG/5ML PO SUSP: 30.0000 mL | ORAL | Status: DC | PRN

## 2020-01-24 MED ORDER — NAPROXEN 500 MG PO TABS: 500.0000 mg | ORAL_TABLET | Freq: Two times a day (BID) | ORAL | Status: DC | PRN

## 2020-01-24 MED ORDER — ALPRAZOLAM 0.5 MG PO TABS: 0.5000 mg | ORAL_TABLET | Freq: Two times a day (BID) | ORAL | Status: DC | PRN

## 2020-01-24 MED ORDER — PANTOPRAZOLE SODIUM 40 MG PO TBEC
40.0000 mg | DELAYED_RELEASE_TABLET | Freq: Every day | ORAL | Status: DC
Start: 2020-01-24 — End: 2020-01-24

## 2020-01-24 MED ORDER — TRAZODONE HCL 50 MG PO TABS
50.0000 mg | ORAL_TABLET | Freq: Every evening | ORAL | Status: DC | PRN
  Administered 2020-01-24 – 2020-01-26 (×2): 50 mg via ORAL
  Filled 2020-01-24 (×2): qty 1

## 2020-01-24 MED ORDER — HYDROXYZINE HCL 25 MG PO TABS
25.0000 mg | ORAL_TABLET | Freq: Four times a day (QID) | ORAL | Status: DC | PRN
  Administered 2020-01-25 – 2020-01-26 (×3): 25 mg via ORAL
  Filled 2020-01-24 (×3): qty 1

## 2020-01-24 MED ORDER — DULOXETINE HCL 60 MG PO CPEP
60.0000 mg | ORAL_CAPSULE | Freq: Every day | ORAL | Status: DC
  Administered 2020-01-27: 60 mg via ORAL
  Filled 2020-01-24 (×6): qty 1

## 2020-01-24 MED ORDER — LOPERAMIDE HCL 2 MG PO CAPS: 2.0000 mg | ORAL_CAPSULE | ORAL | Status: DC | PRN

## 2020-01-24 MED ORDER — MAGNESIUM HYDROXIDE 400 MG/5ML PO SUSP
30.0000 mL | Freq: Every day | ORAL | Status: DC | PRN
  Filled 2020-01-24: qty 30

## 2020-01-24 MED ORDER — ONDANSETRON 4 MG PO TBDP
4.0000 mg | ORAL_TABLET | Freq: Four times a day (QID) | ORAL | Status: DC | PRN
  Administered 2020-01-26: 4 mg via ORAL
  Filled 2020-01-24: qty 1

## 2020-01-24 MED ORDER — ONDANSETRON 4 MG PO TBDP: 8.0000 mg | ORAL_TABLET | Freq: Three times a day (TID) | ORAL | Status: DC | PRN

## 2020-01-24 MED ORDER — LORAZEPAM 1 MG PO TABS
1.0000 mg | ORAL_TABLET | Freq: Four times a day (QID) | ORAL | Status: DC | PRN
  Filled 2020-01-24: qty 1

## 2020-01-24 MED ORDER — ADULT MULTIVITAMIN W/MINERALS CH
1.0000 | ORAL_TABLET | Freq: Every day | ORAL | Status: DC
  Administered 2020-01-25 – 2020-01-27 (×2): 1 via ORAL
  Filled 2020-01-24 (×7): qty 1

## 2020-01-24 MED ORDER — ALBUTEROL SULFATE HFA 108 (90 BASE) MCG/ACT IN AERS
1.0000 | INHALATION_SPRAY | Freq: Four times a day (QID) | RESPIRATORY_TRACT | Status: DC | PRN
  Administered 2020-01-26 – 2020-01-27 (×3): 1 via RESPIRATORY_TRACT
  Filled 2020-01-24: qty 6.7

## 2020-01-24 MED ORDER — HYDROXYZINE HCL 25 MG PO TABS: 25.0000 mg | ORAL_TABLET | Freq: Three times a day (TID) | ORAL | Status: DC | PRN

## 2020-01-24 MED ORDER — ZIPRASIDONE MESYLATE 20 MG IM SOLR
20.0000 mg | INTRAMUSCULAR | Status: DC | PRN
  Filled 2020-01-24: qty 20

## 2020-01-24 MED ORDER — ALPRAZOLAM 0.5 MG PO TABS
0.5000 mg | ORAL_TABLET | Freq: Three times a day (TID) | ORAL | Status: DC | PRN
  Administered 2020-01-24 – 2020-01-27 (×5): 0.5 mg via ORAL
  Filled 2020-01-24 (×5): qty 1

## 2020-01-24 MED ORDER — ACETAMINOPHEN 325 MG PO TABS
650.0000 mg | ORAL_TABLET | Freq: Four times a day (QID) | ORAL | Status: DC | PRN
  Administered 2020-01-27: 650 mg via ORAL
  Filled 2020-01-24: qty 2

## 2020-01-24 MED ORDER — OLANZAPINE 5 MG PO TBDP
5.0000 mg | ORAL_TABLET | Freq: Every day | ORAL | Status: DC
  Administered 2020-01-24: 5 mg via ORAL
  Filled 2020-01-24: qty 1

## 2020-01-24 MED ORDER — PANTOPRAZOLE SODIUM 40 MG PO TBEC
40.0000 mg | DELAYED_RELEASE_TABLET | Freq: Every day | ORAL | Status: DC
  Administered 2020-01-25 – 2020-01-27 (×2): 40 mg via ORAL
  Filled 2020-01-24 (×6): qty 1

## 2020-01-24 MED ORDER — OLANZAPINE 5 MG PO TBDP
5.0000 mg | ORAL_TABLET | Freq: Three times a day (TID) | ORAL | Status: DC | PRN
  Administered 2020-01-25: 5 mg via ORAL
  Filled 2020-01-24 (×2): qty 1

## 2020-01-24 MED ORDER — LISINOPRIL 20 MG PO TABS: 20.0000 mg | ORAL_TABLET | Freq: Every day | ORAL | Status: DC

## 2020-01-24 MED ORDER — OLANZAPINE 5 MG PO TBDP
5.0000 mg | ORAL_TABLET | Freq: Every day | ORAL | Status: DC
  Administered 2020-01-27: 5 mg via ORAL
  Filled 2020-01-24 (×6): qty 1

## 2020-01-24 MED ORDER — THIAMINE HCL 100 MG PO TABS
100.0000 mg | ORAL_TABLET | Freq: Every day | ORAL | Status: DC
  Administered 2020-01-25 – 2020-01-27 (×2): 100 mg via ORAL
  Filled 2020-01-24 (×6): qty 1

## 2020-01-24 MED ORDER — DULOXETINE HCL 60 MG PO CPEP: 60.0000 mg | ORAL_CAPSULE | Freq: Every day | ORAL | Status: DC
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
## 2020-01-24 NOTE — Progress Notes (Addendum)
Admission Note:   Pt admitted to [facility] Adult 500 unit, report received from [place], [place]. Per report pt is IVC, for suicidal ideation with plan to shoot self. Per report, pt UDS is positive for: Cocaine, Amphetamines, Benzos, and marijuana, BAL negative for ETOH, covid is negative, pt allergic to Ativan. Per nurse report pt has a history of Bipolar disorder, is paranoid, believes her husband is trying to kill her. Per report pt is calm, cooperative, was watching tv in ED, took her scheduled medications and has had no behavioral outburts, has a history of HTN and GERD. Upon arrival for admission, it is noted that pt is alert and oriented to person, place, time and situation. Per pt at time time of admission, denies suicidal and homicidal ideation, denies hallucinations, reports anxiety and depression. Pt reports she invited a friend over to her house that brought her cocaine, and it was laced with amphetamines, reports her husband is not aware of most of her substance abuse issues. Per ED nurse pt's husband was with pt at the ED and was supportive. Pt confirms her husband is her best social support. Pt arrived with 4 cell phones and her husband's ID. Pt is cooperative with admission interview, concentration is poor, affect is flat, pt is focused on getting food, something to drink, (both were provided to pt) and then pt was focused on resting in her bed. Pt is guarded, forwards little about the circumstances that brought her to the hospital. Pt denies any history of suicide attempts, and reports that this is her first psych inpatient admission. Pt denies any history of seizures, reports she is prescribed Xanax, reports a history of Afib, admitting NP was notified of this history, Marcie Bal, NP, and NP reviewed pt's recent EKG's in ED, and states Afib was not noted to be a current issue. Pt was cooperative with contraband search and skin assessment this Probation officer completed, with a second nurse witness, Will, Therapist, sports. Will  continue to monitor pt per Q15 minute face checks and monitor for safety and progress.

## 2020-01-24 NOTE — Progress Notes (Signed)
Unsuccessful attempt to call report to [facility]. Nurse unavailable at this time and will return call.

## 2020-01-24 NOTE — Progress Notes (Signed)
Report given to [place] at [facility]. Transportation notified, unknown ETA.

## 2020-01-24 NOTE — BH Assessment (Signed)
[place] Assessment Progress Note  Per Shuvon Rankin, NP, this pt requires psychiatric hospitalization.  [place], RN, AC has assigned pt to [facility] Rm 502-1; [place] will be ready to receive pt at 15:00.  Pt presents under IVC initiated by pt's brother, and upheld by EDP Lacretia Leigh, MD, and IVC documents have been faxed to [facility].  EDP Marda Stalker, MD and pt's nurse, [place], have been notified, and Dawn agrees to call report to [phone].  Pt is to be transported via Event organiser.   Jalene Mullet, [place] Coordinator [phone]

## 2020-01-24 NOTE — Consult Note (Signed)
Telepsych Consultation   Reason for Consult:  IVC: psychosis Referring Physician:  Lacretia Leigh, MD Location of Patient: [facility] ED Location of Provider: [facility]  Patient Identification: Sheri Calderon MRN:  [id number] Principal Diagnosis: <principal problem not specified> Diagnosis:  Active Problems:   * No active hospital problems. *   Total Time spent with patient: 30 minutes  Subjective:   Sheri Calderon is a 54 y.o. female patient admitted to [facility] ED after presenting via law enforcement under involuntary commitment petitioned by her brother [name] at [phone].  Per IVC: "Respondent has been diagnosed with Bi-polar disorder. Respondents' mom died 7 years ago and 2 months ago and 2 months her brother died. Since then, the respondent's behavior has declined. Officers have been called out to her home several times. She believes that people are listening to her through her tv, cell phone, light bulbs, etc. Respondent has an active plan to shoot herself with her 380 gun."   HPI:  Sheri Calderon, 54 y.o., female patient seen via tele psych by this provider, consulted with Dr. Dwyane Dee; and chart reviewed on 01/24/20.  On evaluation Sheri Calderon reports she is feeling more stable.  States that her mind is a lot clearer but not where it should be.  Patient reports she was paranoid that her husband was tampering with her medications, states she went to stay with one of her brothers for the last 2 days.  "I went to him because I thought I could trust him."  Patient reports she had a brother who passed away around 11-28-22.  States that she and her brother were doing drugs together and she feels that the drugs they were doing may have been a result of her brother's death.  "Since he has been dead the gather we were getting the drugs from have been given me more and I have been feeling really anxious and nervous."  Patient reports she does not know what type of drugs she has been doing.   Patient was informed that her UDS was positive for amphetamines, cocaine, marijuana, and benzodiazepines.  Patient reports that only drug she does is marijuana.  But has been purchasing another drug from this guy and is unsure of what type of drug it he is.  Reports her husband is unaware that she has been doing any drugs but feels she needs to talk to him and apologize for the accusations she is made against him.  Patient reports she still does not feel safe and does not know what she has been doing for the last couple of days.  Patient reports that she has been having visual hallucinations but unable to describe them and states she is not sure if she has had any auditory hallucinations. During evaluation Sheri Calderon is sitting on side of bed.  Patient is alert/oriented x 4; calm/cooperative, but anxious and somewhat disorganized.  Her mood is anxious and is congruent with affect.  Patient is speaking in a clear tone at moderate volume, and normal pace; with good eye contact.  Her thought process is coherent, relevant, but disorganized when trying to tell what has been happening for the last couple of days.  There is no indication that she is currently responding to internal/external stimuli or experiencing delusional thought content, other than her reporting that she is having visual hallucinations.  Patient denies suicidal/self-harm/homicidal ideation; but continues to endorse some, psychosis, and paranoia.  Patient has remained calm throughout assessment.  Past Psychiatric History: PTSD, Depression  Risk to Self: Suicidal Ideation: No Suicidal Intent: No Is patient at risk for suicide?: No Suicidal Plan?: No Access to Means: No What has been your use of drugs/alcohol within the last 12 months?:  (THC-present ; cocaine in the past) How many times?:  (n/a) Other Self Harm Risks:  (denies ) Triggers for Past Attempts: Other (Comment) (patient denies ) Intentional Self Injurious Behavior:  None Risk to Others: Homicidal Ideation: No Thoughts of Harm to Others: No Current Homicidal Intent: No Current Homicidal Plan: No Access to Homicidal Means: No Identified Victim:  (n/a) History of harm to others?: No Assessment of Violence: None Noted Violent Behavior Description:  (currently calm and cooperative ) Does patient have access to weapons?: No Criminal Charges Pending?: No Does patient have a court date: No Prior Inpatient Therapy: Prior Inpatient Therapy: No Prior Therapy Dates:  (n/a) Prior Therapy Facilty/Provider(s):  (n/a) Reason for Treatment:  (n/a) Prior Outpatient Therapy: Prior Outpatient Therapy: No Does patient have an ACCT team?: No Does patient have Intensive In-House Services?  : No Does patient have [facility] services? : No Does patient have P4CC services?: No  Past Medical History:  Past Medical History:  Diagnosis Date  . Allergic rhinitis   . Anxiety   . Atrial fibrillation ([place])    ~ 06/2010 single episdose, evaluted at [facility] with testing. No reccurence. Does not see cardiologist (08/01/2018)  . Colon cancer [facility]) 2014   cancer of the sigmoid colon  . Depression   . Diverticulitis    "this is my 2nd time in hospital w/this in the last 2 wk" (06/21/2012)  . Dysrhythmia    1 time issue with A. Fib. In the hospital for 1 week. Spontaneously convereted to NSR.  Marland Kitchen GERD (gastroesophageal reflux disease)   . Gout   . Hypertension   . Lower extremity edema   . Migraines   . Osteoarthritis   . Peripheral neuropathy   . PTSD (post-traumatic stress disorder)   . Restless legs syndrome   . S/P chemotherapy, time since less than 4 weeks   . Tremor, essential     Past Surgical History:  Procedure Laterality Date  . APPENDECTOMY    . CARDIOVASCULAR STRESS TEST     06/24/10 [facility]): No ischemia, EF 53%.  . COLONOSCOPY N/A 08/01/2017   Procedure: COLONOSCOPY;  Surgeon: Lin Landsman, MD;  Location: [facility] ENDOSCOPY;  Service:  Gastroenterology;  Laterality: N/A;  . INCISIONAL HERNIA REPAIR  08/03/2018  . INCISIONAL HERNIA REPAIR N/A 08/03/2018   Procedure: LAPAROSCOPIC INCISIONAL HERNIA REPAIR WITH MESH;  Surgeon: Coralie Keens, MD;  Location: [place];  Service: General;  Laterality: N/A;  . PARTIAL COLECTOMY N/A 06/28/2012   Procedure: PARTIAL COLECTOMY;  Surgeon: Harl Bowie, MD;  Location: [place];  Service: General;  Laterality: N/A;  . PORTACATH PLACEMENT N/A 07/11/2012   Procedure: INSERTION PORT-A-CATH;  Surgeon: Harl Bowie, MD;  Location: [facility];  Service: General;  Laterality: N/A;  . portacath removal    . TRANSTHORACIC ECHOCARDIOGRAM     06/22/10 [facility]): LVEF 55%. Normal LA size. Mild MR/TR. Trace PI.  . TUBAL LIGATION  ~ 1989   Family History:  Family History  Problem Relation Age of Onset  . Diabetes Mother   . Heart disease Mother   . Stomach cancer Mother   . Ovarian cancer Mother 30  . COPD Father   . Hypertension Father   . Hyperlipidemia Father   .  Colon polyps Father        57 lifetime colon polyps  . Down syndrome Paternal Aunt   . Cancer Paternal Uncle        cancer in the spine  . COPD Maternal Grandmother   . Stomach cancer Paternal Grandmother    Family Psychiatric  History: Unaware Social History:  Social History   Substance and Sexual Activity  Alcohol Use No  . Alcohol/week: 0.0 standard drinks     Social History   Substance and Sexual Activity  Drug Use No    Social History   Socioeconomic History  . Marital status: Married    Spouse name: Sherren Mocha  . Number of children: 1  . Years of education: Not on file  . Highest education level: Not on file  Occupational History    Employer: [employer]    Comment: quality control inspector  Tobacco Use  . Smoking status: Current Every Day Smoker    Packs/day: 0.20    Years: 15.00    Pack years: 3.00    Types: Cigarettes    Start date: 01/17/1991  . Smokeless tobacco: Never Used  Vaping  Use  . Vaping Use: Never used  Substance and Sexual Activity  . Alcohol use: No    Alcohol/week: 0.0 standard drinks  . Drug use: No  . Sexual activity: Yes    Birth control/protection: Surgical  Other Topics Concern  . Not on file  Social History Narrative   Married w/grown daughter born 50 - 1 grandson born 2009   [place] 2nd shift for [employer] complany now [employer]   Husband works out of town frequently   4 caffeine drinks ([place]) qd   10/29/2014 update   Social Determinants of Health   Financial Resource Strain:   . Difficulty of Paying Living Expenses: Not on file  Food Insecurity:   . Worried About Charity fundraiser in the Last Year: Not on file  . Ran Out of Food in the Last Year: Not on file  Transportation Needs:   . Lack of Transportation (Medical): Not on file  . Lack of Transportation (Non-Medical): Not on file  Physical Activity:   . Days of Exercise per Week: Not on file  . Minutes of Exercise per Session: Not on file  Stress:   . Feeling of Stress : Not on file  Social Connections:   . Frequency of Communication with Friends and Family: Not on file  . Frequency of Social Gatherings with Friends and Family: Not on file  . Attends Religious Services: Not on file  . Active Member of Clubs or Organizations: Not on file  . Attends Archivist Meetings: Not on file  . Marital Status: Not on file   Additional Social History:    Allergies:   Allergies  Allergen Reactions  . Lorazepam Hives and Swelling    Patient reports receiving lorazepam intensol in the hospital and experienced swelling with hives.  . Gabapentin Rash    Labs:  Results for orders placed or performed during the hospital encounter of 01/23/20 (from the past 48 hour(s))  Rapid urine drug screen (hospital performed)     Status: Abnormal   Collection Time: 01/23/20 12:58 PM  Result Value Ref Range   Opiates NONE DETECTED NONE DETECTED    Cocaine POSITIVE (A) NONE DETECTED   Benzodiazepines POSITIVE (A) NONE DETECTED   Amphetamines POSITIVE (A) NONE DETECTED   Tetrahydrocannabinol POSITIVE (A) NONE DETECTED  Barbiturates NONE DETECTED NONE DETECTED    Comment: (NOTE) DRUG SCREEN FOR MEDICAL PURPOSES ONLY.  IF CONFIRMATION IS NEEDED FOR ANY PURPOSE, NOTIFY LAB WITHIN 5 DAYS.  LOWEST DETECTABLE LIMITS FOR URINE DRUG SCREEN Drug Class                     Cutoff (ng/mL) Amphetamine and metabolites    1000 Barbiturate and metabolites    200 Benzodiazepine                 573 Tricyclics and metabolites     300 Opiates and metabolites        300 Cocaine and metabolites        300 THC                            50 Performed at [facility], [place] [address] [place], [place] [address]   Pregnancy, urine     Status: None   Collection Time: 01/23/20 12:58 PM  Result Value Ref Range   Preg Test, Ur NEGATIVE NEGATIVE    Comment:        THE SENSITIVITY OF THIS METHODOLOGY IS >20 mIU/mL. Performed at [facility], [place] [address], [place], [place] [address]   Ethanol     Status: None   Collection Time: 01/23/20  1:15 PM  Result Value Ref Range   Alcohol, Ethyl (B) <10 <10 mg/dL    Comment: (NOTE) Lowest detectable limit for serum alcohol is 10 mg/dL.  For medical purposes only. Performed at [facility], [place] [address], [place], [place] [address]   Salicylate level     Status: Abnormal   Collection Time: 01/23/20  1:15 PM  Result Value Ref Range   Salicylate Lvl <8.3 (L) 7.0 - 30.0 mg/dL    Comment: Performed at [facility], [place] [address], [place], [place] [address]  Acetaminophen level     Status: Abnormal   Collection Time: 01/23/20  1:15 PM  Result Value Ref Range   Acetaminophen (Tylenol), Serum <10 (L) 10 - 30 ug/mL    Comment: (NOTE) Therapeutic concentrations vary significantly. A range of 10-30 ug/mL  may be an effective  concentration for many patients. However, some  are best treated at concentrations outside of this range. Acetaminophen concentrations >150 ug/mL at 4 hours after ingestion  and >50 ug/mL at 12 hours after ingestion are often associated with  toxic reactions.  Performed at [facility], [place] [address], [place],  [address]   CBC with Differential/Platelet     Status: Abnormal   Collection Time: 01/23/20  1:15 PM  Result Value Ref Range   WBC 7.7 4.0 - 10.5 K/uL   RBC 4.46 3.87 - 5.11 MIL/uL   Hemoglobin 12.9 12.0 - 15.0 g/dL   HCT 39.0 36 - 46 %   MCV 87.4 80.0 - 100.0 fL   MCH 28.9 26.0 - 34.0 pg   MCHC 33.1 30.0 - 36.0 g/dL   RDW 12.7 11.5 - 15.5 %   Platelets 416 (H) 150 - 400 K/uL   nRBC 0.0 0.0 - 0.2 %   Neutrophils Relative % 64 %   Neutro Abs 4.9 1.7 - 7.7 K/uL   Lymphocytes Relative 26 %   Lymphs Abs 2.0 0.7 - 4.0 K/uL   Monocytes Relative 8 %   Monocytes Absolute 0.6 0.1 - 1.0 K/uL   Eosinophils  Relative 1 %   Eosinophils Absolute 0.1 0.0 - 0.5 K/uL   Basophils Relative 1 %   Basophils Absolute 0.1 0.0 - 0.1 K/uL   Immature Granulocytes 0 %   Abs Immature Granulocytes 0.01 0.00 - 0.07 K/uL    Comment: Performed at [facility], [place] [address], [place], [place] [address]  Comprehensive metabolic panel     Status: Abnormal   Collection Time: 01/23/20  1:15 PM  Result Value Ref Range   Sodium 141 135 - 145 mmol/L   Potassium 3.0 (L) 3.5 - 5.1 mmol/L   Chloride 105 98 - 111 mmol/L   CO2 25 22 - 32 mmol/L   Glucose, Bld 95 70 - 99 mg/dL    Comment: Glucose reference range applies only to samples taken after fasting for at least 8 hours.   BUN 18 6 - 20 mg/dL   Creatinine, Ser 0.89 0.44 - 1.00 mg/dL   Calcium 9.3 8.9 - 10.3 mg/dL   Total Protein 7.6 6.5 - 8.1 g/dL   Albumin 4.6 3.5 - 5.0 g/dL   AST 21 15 - 41 U/L   ALT 25 0 - 44 U/L   Alkaline Phosphatase 52 38 - 126 U/L   Total Bilirubin 0.8 0.3 - 1.2 mg/dL   GFR,  Estimated >60 >60 mL/min    Comment: (NOTE) Calculated using the CKD-EPI Creatinine Equation (2021)    Anion gap 11 5 - 15    Comment: Performed at [facility], [place] [address], [place], [place] [address]  Resp Panel by RT-PCR (Flu A&B, Covid) Nasopharyngeal Swab     Status: None   Collection Time: 01/23/20  3:18 PM   Specimen: Nasopharyngeal Swab; Nasopharyngeal(NP) swabs in vial transport medium  Result Value Ref Range   SARS Coronavirus 2 by RT PCR NEGATIVE NEGATIVE    Comment: (NOTE) SARS-CoV-2 target nucleic acids are NOT DETECTED.  The SARS-CoV-2 RNA is generally detectable in upper respiratory specimens during the acute phase of infection. The lowest concentration of SARS-CoV-2 viral copies this assay can detect is 138 copies/mL. A negative result does not preclude SARS-Cov-2 infection and should not be used as the sole basis for treatment or other patient management decisions. A negative result may occur with  improper specimen collection/handling, submission of specimen other than nasopharyngeal swab, presence of viral mutation(s) within the areas targeted by this assay, and inadequate number of viral copies(<138 copies/mL). A negative result must be combined with clinical observations, patient history, and epidemiological information. The expected result is Negative.  Fact Sheet for Patients:  EntrepreneurPulse.com.au  Fact Sheet for Healthcare Providers:  IncredibleEmployment.be  This test is no t yet approved or cleared by the [place] FDA and  has been authorized for detection and/or diagnosis of SARS-CoV-2 by FDA under an Emergency Use Authorization (EUA). This EUA will remain  in effect (meaning this test can be used) for the duration of the COVID-19 declaration under Section 564(b)(1) of the Act, 21 U.S.C.section 360bbb-3(b)(1), unless the authorization is terminated  or revoked sooner.        Influenza A by PCR NEGATIVE NEGATIVE   Influenza B by PCR NEGATIVE NEGATIVE    Comment: (NOTE) The Xpert Xpress SARS-CoV-2/FLU/RSV plus assay is intended as an aid in the diagnosis of influenza from Nasopharyngeal swab specimens and should not be used as a sole basis for treatment. Nasal washings and aspirates are unacceptable for Xpert Xpress SARS-CoV-2/FLU/RSV testing.  Fact Sheet for Patients: EntrepreneurPulse.com.au  Fact  Sheet for Healthcare Providers: IncredibleEmployment.be  This test is not yet approved or cleared by the [place] and has been authorized for detection and/or diagnosis of SARS-CoV-2 by FDA under an Emergency Use Authorization (EUA). This EUA will remain in effect (meaning this test can be used) for the duration of the COVID-19 declaration under Section 564(b)(1) of the Act, 21 U.S.C. section 360bbb-3(b)(1), unless the authorization is terminated or revoked.  Performed at [facility], [place] [address], [place], [place] [address]     Medications:  Current Facility-Administered Medications  Medication Dose Route Frequency Provider Last Rate Last Admin  . acetaminophen (TYLENOL) tablet 650 mg  650 mg Oral Q6H PRN Lacretia Leigh, MD      . ALPRAZolam Duanne Moron) tablet 0.5 mg  0.5 mg Oral TID PRN Lacretia Leigh, MD   0.5 mg at 01/24/20 0841  . DULoxetine (CYMBALTA) DR capsule 60 mg  60 mg Oral Daily Lacretia Leigh, MD   60 mg at 01/24/20 0841  . lisinopril (ZESTRIL) tablet 10 mg  10 mg Oral Daily Lacretia Leigh, MD   10 mg at 01/24/20 0842  . OLANZapine zydis (ZYPREXA) disintegrating tablet 5 mg  5 mg Oral Daily Aviah Sorci B, NP   5 mg at 01/24/20 1058  . ondansetron (ZOFRAN) tablet 8 mg  8 mg Oral Q8H PRN Lacretia Leigh, MD      . pantoprazole (PROTONIX) EC tablet 40 mg  40 mg Oral Daily Lacretia Leigh, MD   40 mg at 01/24/20 6440   Current Outpatient Medications  Medication Sig Dispense Refill   . albuterol (VENTOLIN HFA) 108 (90 Base) MCG/ACT inhaler Inhale 1 puff into the lungs every 6 (six) hours as needed for wheezing or shortness of breath.    . ALPRAZolam (XANAX) 0.5 MG tablet Take 0.5 mg by mouth 3 (three) times daily as needed for anxiety.  2  . cyclobenzaprine (FLEXERIL) 10 MG tablet Take 10 mg by mouth 3 (three) times daily as needed for muscle spasms.    . DULoxetine (CYMBALTA) 60 MG capsule Take 60 mg by mouth daily.     Marland Kitchen lisinopril (ZESTRIL) 20 MG tablet Take 20 mg by mouth daily.    . naproxen (NAPROSYN) 500 MG tablet Take 500 mg by mouth 2 (two) times daily as needed for moderate pain.    Marland Kitchen omeprazole (PRILOSEC) 20 MG capsule Take 20 mg by mouth daily.    . ondansetron (ZOFRAN) 8 MG tablet Take 8 mg by mouth every 8 (eight) hours as needed for nausea or vomiting.    . pregabalin (LYRICA) 150 MG capsule Take 150 mg by mouth 2 (two) times daily.    . rizatriptan (MAXALT) 10 MG tablet Take 10 mg by mouth See admin instructions. Take 10 mg by mouth at onset of headache. Repeat in 2 hours if needed. Maximum 2 tablets in 24 hours.  3  . simethicone (MYLICON) 80 MG chewable tablet Chew 1 tablet (80 mg total) by mouth 4 (four) times daily as needed for flatulence. (Patient not taking: Reported on 01/23/2020) 30 tablet 0    Musculoskeletal: Strength & Muscle Tone: within normal limits Gait & Station: normal Patient leans: N/A  Psychiatric Specialty Exam: Physical Exam Vitals and nursing note reviewed.  Constitutional:      Appearance: Normal appearance. She is obese.  Pulmonary:     Effort: Pulmonary effort is normal.  Musculoskeletal:        General: Normal range of motion.  Neurological:  Mental Status: She is alert and oriented to person, place, and time.  Psychiatric:        Attention and Perception: Attention normal. She perceives auditory hallucinations.        Mood and Affect: Mood is anxious and depressed. Affect is labile and tearful.        Speech:  Speech normal.        Behavior: Behavior normal. Behavior is cooperative.        Thought Content: Thought content is paranoid and delusional. Thought content does not include homicidal or suicidal ideation.        Cognition and Memory: Cognition normal.        Judgment: Judgment is impulsive.     Review of Systems  Psychiatric/Behavioral: Positive for agitation, confusion and hallucinations. Negative for sleep disturbance and suicidal ideas. The patient is nervous/anxious and is hyperactive.   All other systems reviewed and are negative.   Blood pressure (!) 151/93, pulse 80, temperature 97.9 F (36.6 C), temperature source Oral, resp. rate 20, last menstrual period 10/12/2012, SpO2 92 %.There is no height or weight on file to calculate BMI.  General Appearance: Casual  Eye Contact:  Good  Speech:  Clear and Coherent and Pressured  Volume:  Normal  Mood:  Anxious, Depressed and Irritable  Affect:  Labile and Tearful  Thought Process:  Disorganized and Descriptions of Associations: Tangential  Orientation:  Full (Time, Place, and Person)  Thought Content:  Delusions, Paranoid Ideation and Tangential  Suicidal Thoughts:  No  Homicidal Thoughts:  No  Memory:  Immediate;   Fair Recent;   Fair  Judgement:  Fair  Insight:  Fair  Psychomotor Activity:  Restlessness  Concentration:  Concentration: Fair and Attention Span: Fair  Recall:  [employer] of Knowledge:  Fair  Language:  Good  Akathisia:  No  Handed:  Right  AIMS (if indicated):     Assets:  Communication Skills Desire for Improvement Housing Social Support  ADL's:  Intact  Cognition:  WNL  Sleep:      Treatment Plan Summary: Daily contact with patient to assess and evaluate symptoms and progress in treatment, Medication management and Plan Inpatient psychiatric treatment  Disposition: Recommend psychiatric Inpatient admission when medically cleared.  This service was provided via telemedicine using a 2-way,  interactive audio and video technology.  Names of all persons participating in this telemedicine service and their role in this encounter. Name: Earleen Newport Role: NP  Name: Dr. Hampton Abbot Role: Psychiatrist  Name: Sheri Calderon Role: Patient  Name:  Role:     Earleen Newport, NP 01/24/2020 11:16 AM

## 2020-01-24 NOTE — ED Notes (Signed)
Provider @ bedside via telepsych machine

## 2020-01-24 NOTE — ED Provider Notes (Addendum)
Emergency Medicine Observation Re-evaluation Note  Sheri Calderon is a 54 y.o. female, seen on rounds today.  Pt initially presented to the ED for complaints of IVC Currently, the patient is awaiting further psychiatric recommendations.  Physical Exam  BP (!) 141/100   Pulse 79   Temp 97.9 F (36.6 C) (Oral)   Resp 20   LMP 10/12/2012 (Exact Date)   SpO2 93%  Physical Exam General: Resting comfortably, ate breakfast Cardiac: No murmur, regular rate Lungs: Clear Psych: Patient reports she is starting to understand some more insights into how she has been doing and what is going on.  ED Course / MDM  EKG:    I have reviewed the labs performed to date as well as medications administered while in observation.  Recent changes in the last 24 hours include none.  Plan  Current plan is for reassessment and determination of disposition. Patient is under full IVC at this time.   Rameses Ou, Gwenyth Allegra, MD 01/24/20 0945  1:19 PM Nursing team reports that patient is accepted to [facility] under care of Dr. Mallie Darting.  Will flip him over for admission to [facility].   Kenyan Karnes, Gwenyth Allegra, MD 01/24/20 1319

## 2020-01-24 NOTE — ED Notes (Signed)
Patient was very anxious around 0830 and has since laid down to rest. EKG order acknowledged and will so around lunch time when patient more alert. Will also plan to give Zyprexa and repeat vitals at that that time.

## 2020-01-25 ENCOUNTER — Encounter (HOSPITAL_COMMUNITY): Payer: Self-pay | Admitting: Registered Nurse

## 2020-01-25 ENCOUNTER — Inpatient Hospital Stay (HOSPITAL_COMMUNITY): Payer: Medicare Other

## 2020-01-25 ENCOUNTER — Other Ambulatory Visit: Payer: Self-pay

## 2020-01-25 LAB — CBC WITH DIFFERENTIAL/PLATELET
Abs Immature Granulocytes: 0 10*3/uL (ref 0.00–0.07)
Basophils Absolute: 0 10*3/uL (ref 0.0–0.1)
Basophils Relative: 1 %
Eosinophils Absolute: 0.3 10*3/uL (ref 0.0–0.5)
Eosinophils Relative: 6 %
HCT: 40 % (ref 36.0–46.0)
Hemoglobin: 12.7 g/dL (ref 12.0–15.0)
Immature Granulocytes: 0 %
Lymphocytes Relative: 37 %
Lymphs Abs: 1.8 10*3/uL (ref 0.7–4.0)
MCH: 28.7 pg (ref 26.0–34.0)
MCHC: 31.8 g/dL (ref 30.0–36.0)
MCV: 90.3 fL (ref 80.0–100.0)
Monocytes Absolute: 0.4 10*3/uL (ref 0.1–1.0)
Monocytes Relative: 9 %
Neutro Abs: 2.2 10*3/uL (ref 1.7–7.7)
Neutrophils Relative %: 47 %
Platelets: 339 10*3/uL (ref 150–400)
RBC: 4.43 MIL/uL (ref 3.87–5.11)
RDW: 12.5 % (ref 11.5–15.5)
WBC: 4.7 10*3/uL (ref 4.0–10.5)
nRBC: 0 % (ref 0.0–0.2)

## 2020-01-25 LAB — COMPREHENSIVE METABOLIC PANEL
ALT: 21 U/L (ref 0–44)
AST: 17 U/L (ref 15–41)
Albumin: 3.2 g/dL — ABNORMAL LOW (ref 3.5–5.0)
Alkaline Phosphatase: 42 U/L (ref 38–126)
Anion gap: 10 (ref 5–15)
BUN: 20 mg/dL (ref 6–20)
CO2: 26 mmol/L (ref 22–32)
Calcium: 9.3 mg/dL (ref 8.9–10.3)
Chloride: 109 mmol/L (ref 98–111)
Creatinine, Ser: 0.8 mg/dL (ref 0.44–1.00)
GFR, Estimated: >60 mL/min (ref >60)
Glucose, Bld: 106 mg/dL — ABNORMAL HIGH (ref 70–99)
Potassium: 3.4 mmol/L — ABNORMAL LOW (ref 3.5–5.1)
Sodium: 145 mmol/L (ref 135–145)
Total Bilirubin: 0.4 mg/dL (ref 0.3–1.2)
Total Protein: 6.1 g/dL — ABNORMAL LOW (ref 6.5–8.1)

## 2020-01-25 LAB — TROPONIN I (HIGH SENSITIVITY)
Troponin I (High Sensitivity): 3 ng/L (ref <18)
Troponin I (High Sensitivity): 3 ng/L (ref <18)

## 2020-01-25 LAB — TSH: TSH: 0.593 u[IU]/mL (ref 0.350–4.500)

## 2020-01-25 MED ORDER — STERILE WATER FOR INJECTION IJ SOLN
INTRAMUSCULAR | Status: AC
  Filled 2020-01-25: qty 10

## 2020-01-25 MED ORDER — POTASSIUM CHLORIDE CRYS ER 20 MEQ PO TBCR
40.0000 meq | EXTENDED_RELEASE_TABLET | Freq: Once | ORAL | Status: AC
  Administered 2020-01-25: 40 meq via ORAL
  Filled 2020-01-25: qty 2

## 2020-01-25 MED ORDER — METOPROLOL TARTRATE 25 MG PO TABS
25.0000 mg | ORAL_TABLET | Freq: Once | ORAL | Status: AC
  Administered 2020-01-25: 25 mg via ORAL
  Filled 2020-01-25: qty 1

## 2020-01-25 MED ORDER — ZIPRASIDONE MESYLATE 20 MG IM SOLR
20.0000 mg | Freq: Once | INTRAMUSCULAR | Status: AC
  Administered 2020-01-25: 20 mg via INTRAMUSCULAR
  Filled 2020-01-25: qty 20

## 2020-01-25 MED ORDER — METOPROLOL TARTRATE 25 MG PO TABS: 25.0000 mg | ORAL_TABLET | Freq: Two times a day (BID) | ORAL | 0 refills | Status: DC

## 2020-01-25 NOTE — Progress Notes (Signed)
Husband notified of patient being transported to [facility].

## 2020-01-25 NOTE — ED Notes (Signed)
Ordered breakfast 

## 2020-01-25 NOTE — ED Notes (Signed)
Pt requesting to use bathroom before going into room, refuses to use bathroom near her room, bedside commode brought into room for pt use. Security remains with pt as well as Actuary.

## 2020-01-25 NOTE — ED Notes (Signed)
Pt refusing to take any further medications. Pt walking out door stating that she is going to leave, sitter remains with pt. Security called, multiple staff attempting to de-esculate pt. Pt continues to try to leave.

## 2020-01-25 NOTE — ED Triage Notes (Signed)
Pt from [facility]; reports palpitations; EMS state pt also reported stroke symptoms, but none observed in the field.

## 2020-01-25 NOTE — Progress Notes (Signed)
Patient was sent out to be assessed at [facility] after c/o tingling in her fingertips and lips, chest tightness and feeling drowsy. Last set of vitals taken at 0225 temp- 97.4, blood pressure 127/113 and pulse flucuating 78,82 and 105, O2 at 98%. She received 0.5 of xanax and 50 mg of trazodone at 2059.

## 2020-01-25 NOTE — Progress Notes (Addendum)
Pt returned to unit from [facility] after being medically cleared. Pt was also given Metoprolol, troponin level done while in the ED X2 and WNL. Awake in room eating lunch at this time. Ambulatory with a steady gait. A & O X3 with congruent affect, logical, clear speech and fair eye contact. Denies SI, HI, AVH and pain / discomfort when assessed. Rates her anxiety 5/10 and depression 4/10 "Just heartbroken and ashamed about my drug use. The last time I used drugs I thought it was cocaine but it ended up being meth". Per [facility] pt refused scheduled morning medications while at [facility]. Tolerated lunch well.   Vitals done, WNL. Emotional support support offered. Scheduled medications given as ordered with verbal education and effects monitored. Writer encouraged pt to voice concerns. Safety checks maintained without outburst to noted thus far.  Pt tolerates lunch and fluids well. Denies discomfort. Remains safe on and off unit.

## 2020-01-25 NOTE — Progress Notes (Signed)
   01/25/20 2200  Psych Admission Type (Psych Patients Only)  Admission Status Involuntary  Psychosocial Assessment  Patient Complaints Anxiety  Eye Contact Brief  Facial Expression Flat  Affect Appropriate to circumstance  Speech Logical/coherent  Interaction Cautious;Minimal;Guarded  Motor Activity Fidgety;Unsteady  Appearance/Hygiene In scrubs  Behavior Characteristics Appropriate to situation  Mood Anxious  Thought Process  Coherency WDL  Content WDL  Delusions None reported or observed  Perception WDL  Hallucination None reported or observed  Judgment Poor  Confusion None  Danger to Self  Current suicidal ideation? Denies  Danger to Others  Danger to Others None reported or observed

## 2020-01-25 NOTE — Progress Notes (Signed)
Pt asked this writer to come into room while doing safety check and stated "My heart is really racing , can you please check my blood pressure?" Vital signs were taken while sitting only, due to patient feeling too weak to stand. Blood pressure and pulse were fluctuating range went from 155/101 pulse-90 to 120/78 pulse-55 within a matter of 5 min. Patient was cool and sweaty to touch, also complaining of lips and left hand felling "numb." This Probation officer and assigned RN assisted patient into wheelchair and sat in room with patient. On duty PA was notified of patients symptoms and determined that patient needed to seek medical treatment. Patient continued to state "My lips feel numb and my tongue is getting thick." RN called 911 and was assessed by paramedics. Patient was transferred to [facility] ED.

## 2020-01-25 NOTE — ED Notes (Signed)
Pt initially refused all medications when she was told that due to IVC she could not have visitors nor check herself out. Pt made an agreement with this nurse that if she would be allowed to go to the bathroom she would take medication. Pt ambulated to bathroom on her own power with sitter. Gait even and steady.

## 2020-01-25 NOTE — Discharge Instructions (Addendum)
You were seen in the ER today for chest pain and palpitations.  You were found to be in afib.  We are starting you on metoprolol to take 25 mg (1tablet) twice daily to help with heart rate control.   We have prescribed you new medication(s) today. Discuss the medications prescribed today with your pharmacist as they can have adverse effects and interactions with your other medicines including over the counter and prescribed medications. Seek medical evaluation if you start to experience new or abnormal symptoms after taking one of these medicines, seek care immediately if you start to experience difficulty breathing, feeling of your throat closing, facial swelling, or rash as these could be indications of a more serious allergic reaction  Please call the A. fib clinic for follow-up as soon as possible, we have also placed a referral.  Return to the ER for any new or worsening symptoms including but not limited to heart racing sensation, dizziness, lightheadedness, passing out, chest pain, trouble breathing, or any other concerns.

## 2020-01-25 NOTE — Progress Notes (Signed)
Patient up briefly for medications and fluids. She received a call from her daughter who left her phone number for her to call. She was suspicious of the phone number and daughters name, asking writer how does she know this is really her daughter who called. She reported that she was going to call her husband to make sure its her. She has been very unsteady on her feet, needing to sit down at medication window reporting that her legs are buckling and she feel like she might fall. Patient advised to use call bell when up in her room ambulating and needs assistance. Patient reoriented on fall prevention. Safety maintained with 15 min checks.

## 2020-01-25 NOTE — ED Notes (Signed)
[facility] here to transport pt back to [facility]

## 2020-01-25 NOTE — ED Triage Notes (Signed)
Pt from [facility]; reports palpitations; EMS say pt also complained of stroke like symptoms, but presentation was inconsistent with a stroke.  Pt on monitor with Afib at this time.

## 2020-01-25 NOTE — Progress Notes (Signed)
   01/24/20 2100  COVID-19 Daily Checkoff  Have you had a fever (temp > 37.80C/100F)  in the past 24 hours?  No  If you have had runny nose, nasal congestion, sneezing in the past 24 hours, has it worsened? No  COVID-19 EXPOSURE  Have you traveled outside the state in the past 14 days? No  Have you been in contact with someone with a confirmed diagnosis of COVID-19 or PUI in the past 14 days without wearing appropriate PPE? No  Have you been living in the same home as a person with confirmed diagnosis of COVID-19 or a PUI (household contact)? No  Have you been diagnosed with COVID-19? No

## 2020-01-25 NOTE — ED Notes (Addendum)
Pt stated that she wanted to go home to see her dog and that she was going to walk out. Pt informed that she needed to go back to her room and complied after receiving Geodon. Pt very apologetic and cordial toward staff.

## 2020-01-25 NOTE — ED Notes (Signed)
[place] nonemergency number to arrange transport for pt back to [facility]. They will send transportation as soon as possible

## 2020-01-25 NOTE — ED Notes (Signed)
Pt resting quietly in bed with eyes closed, VSS, respirations even and unlabored

## 2020-01-25 NOTE — ED Notes (Signed)
Lunch Tray Ordered @ 8485.

## 2020-01-25 NOTE — ED Notes (Signed)
Pt remains in hallway after Geodon but states that she will return to room "when shot kicks in". Security as well as several staff members remain with pt at this time. Pt agreed to allow this nurse to give her Geodon shot in her right deltoid.

## 2020-01-25 NOTE — ED Provider Notes (Addendum)
[place] EMERGENCY DEPARTMENT Provider Note   CSN: [id number] Arrival date & time: 01/25/20  0340     History Chief Complaint  Patient presents with  . MDD  . ANXIETYY DISORDER  . Post-Traumatic Stress Disorder  . Palpitations    Sheri Calderon is a 54 y.o. female with a history of bipolar 1 disorder, PTSD, atrial fibrillation, GERD, hypertension, polysubstance abuse, and colon cancer who presents to the emergency department from behavioral health for evaluation of palpitations that began shortly prior to arrival.  Patient states she was at rest when she started to feel her heart race with some chest pressure and tingling in her lips and fingertips bilaterally.  She was sent to the emergency department for further assessment.  She states she still having minimal tingling as well as a heart racing sensation that is a bit worse when she moves, no alleviating factors.  She is currently chest pain-free.  She cannot tell me much about the duration of her symptoms or further details on her chest discomfort.  She frequently rolls over when I ask her questions limiting history some. Denies fever, chills, cough, dyspnea, syncope, focal weakness, or leg pain/swelling.   HPI     Past Medical History:  Diagnosis Date  . Allergic rhinitis   . Anxiety   . Atrial fibrillation ([place])    ~ 06/2010 single episdose, evaluted at [facility] with testing. No reccurence. Does not see cardiologist (08/01/2018)  . Colon cancer [facility]) 2014   cancer of the sigmoid colon  . Depression   . Diverticulitis    "this is my 2nd time in hospital w/this in the last 2 wk" (06/21/2012)  . Dysrhythmia    1 time issue with A. Fib. In the hospital for 1 week. Spontaneously convereted to NSR.  Marland Kitchen GERD (gastroesophageal reflux disease)   . Gout   . Hypertension   . Lower extremity edema   . Migraines   . Osteoarthritis   . Peripheral neuropathy   . PTSD (post-traumatic stress disorder)   .  Restless legs syndrome   . S/P chemotherapy, time since less than 4 weeks   . Tremor, essential     Patient Active Problem List   Diagnosis Date Noted  . Polysubstance abuse ([place]) 01/24/2020  . 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 Severe manic bipolar 1 disorder with psychotic behavior ([place]) 01/24/2020  . Substance induced mood disorder ([place]) 01/24/2020  . Sepsis ([place]) 11/07/2018  . Incisional hernia 08/03/2018  . CAP (community acquired pneumonia) 05/13/2018  . Ileitis 07/30/2017  . Generalized abdominal pain   . Genetic testing 02/14/2016  . Personal history of sigmoid colon cancer 10/29/2014  . Weight gain 10/29/2014  . Cancer of sigmoid colon ([place]) 08/04/2012  . Restless legs syndrome     Past Surgical History:  Procedure Laterality Date  . APPENDECTOMY    . CARDIOVASCULAR STRESS TEST     06/24/10 [facility]): No ischemia, EF 53%.  . COLONOSCOPY N/A 08/01/2017   Procedure: COLONOSCOPY;  Surgeon: Lin Landsman, MD;  Location: [facility];  Service: Gastroenterology;  Laterality: N/A;  . INCISIONAL HERNIA REPAIR  08/03/2018  . INCISIONAL HERNIA REPAIR N/A 08/03/2018   Procedure: LAPAROSCOPIC INCISIONAL HERNIA REPAIR WITH MESH;  Surgeon: Coralie Keens, MD;  Location: [facility];  Service: General;  Laterality: N/A;  . PARTIAL COLECTOMY N/A 06/28/2012   Procedure: PARTIAL COLECTOMY;  Surgeon: Harl Bowie, MD;  Location:  [facility] OR;  Service: General;  Laterality: N/A;  . PORTACATH PLACEMENT N/A 07/11/2012   Procedure: INSERTION PORT-A-CATH;  Surgeon: Harl Bowie, MD;  Location: [facility] ORS;  Service: General;  Laterality: N/A;  . portacath removal    . TRANSTHORACIC ECHOCARDIOGRAM     06/22/10 [facility]): LVEF 55%. Normal LA size. Mild MR/TR. Trace PI.  . TUBAL LIGATION  ~ 1989     OB History   No obstetric history on file.     Family History  Problem Relation Age of Onset  .  Diabetes Mother   . Heart disease Mother   . Stomach cancer Mother   . Ovarian cancer Mother 32  . COPD Father   . Hypertension Father   . Hyperlipidemia Father   . Colon polyps Father        18 lifetime colon polyps  . Down syndrome Paternal Aunt   . Cancer Paternal Uncle        cancer in the spine  . COPD Maternal Grandmother   . Stomach cancer Paternal Grandmother     Social History   Tobacco Use  . Smoking status: Current Every Day Smoker    Packs/day: 0.20    Years: 15.00    Pack years: 3.00    Types: Cigarettes    Start date: 01/17/1991  . Smokeless tobacco: Never Used  Vaping Use  . Vaping Use: Never used  Substance Use Topics  . Alcohol use: No    Alcohol/week: 0.0 standard drinks  . Drug use: No    Home Medications Prior to Admission medications   Medication Sig Start Date End Date Taking? Authorizing Provider  albuterol (VENTOLIN HFA) 108 (90 Base) MCG/ACT inhaler Inhale 1 puff into the lungs every 6 (six) hours as needed for wheezing or shortness of breath.    [provider]  ALPRAZolam Duanne Moron) 0.5 MG tablet Take 0.5 mg by mouth 3 (three) times daily as needed for anxiety. 07/18/17   [provider]  cyclobenzaprine (FLEXERIL) 10 MG tablet Take 10 mg by mouth 3 (three) times daily as needed for muscle spasms.    [provider]  DULoxetine (CYMBALTA) 60 MG capsule Take 60 mg by mouth daily.     [provider]  lisinopril (ZESTRIL) 20 MG tablet Take 20 mg by mouth daily.    [provider]  naproxen (NAPROSYN) 500 MG tablet Take 500 mg by mouth 2 (two) times daily as needed for moderate pain.    [provider]  omeprazole (PRILOSEC) 20 MG capsule Take 20 mg by mouth daily.    [provider]  ondansetron (ZOFRAN) 8 MG tablet Take 8 mg by mouth every 8 (eight) hours as needed for nausea or vomiting.    [provider]  pregabalin (LYRICA) 150 MG capsule Take 150 mg by mouth 2 (two) times  daily.    [provider]  rizatriptan (MAXALT) 10 MG tablet Take 10 mg by mouth See admin instructions. Take 10 mg by mouth at onset of headache. Repeat in 2 hours if needed. Maximum 2 tablets in 24 hours. 06/10/17   [provider]  simethicone (MYLICON) 80 MG chewable tablet Chew 1 tablet (80 mg total) by mouth 4 (four) times daily as needed for flatulence. Patient not taking: Reported on 01/23/2020 11/09/18   Saundra Shelling, MD    Allergies    Lorazepam and Gabapentin  Review of Systems   Review of Systems  Constitutional: Negative for chills and fever.  Respiratory: Negative for shortness of breath.   Cardiovascular: Positive for chest pain and palpitations.  Gastrointestinal: Negative for abdominal pain and vomiting.  Neurological: Negative for seizures, syncope and weakness.       Positive for paresthesias to lips/finger tips.  All other systems reviewed and are negative.   Physical Exam Updated Vital Signs BP 110/89   Pulse 95   Temp 98.5 F (36.9 C) (Oral)   Resp (!) 23   Ht 5' 9"  (1.753 m)   Wt 90 kg   LMP 10/12/2012 (Exact Date)   SpO2 96%   BMI 29.30 kg/m   Physical Exam Vitals and nursing note reviewed.  Constitutional:      General: She is not in acute distress.    Appearance: She is well-developed. She is not toxic-appearing.  HENT:     Head: Normocephalic and atraumatic.  Eyes:     General:        Right eye: No discharge.        Left eye: No discharge.     Conjunctiva/sclera: Conjunctivae normal.  Cardiovascular:     Comments: Irregularly irregular. Pulmonary:     Effort: Pulmonary effort is normal. No respiratory distress.     Breath sounds: Normal breath sounds. No wheezing, rhonchi or rales.  Abdominal:     General: There is no distension.     Palpations: Abdomen is soft.     Tenderness: There is no abdominal tenderness.  Musculoskeletal:     Cervical back: Neck supple.  Skin:    General: Skin is warm and dry.     Findings:  No rash.  Neurological:     Mental Status: She is alert.     Comments: Clear speech.  Sensation grossly intact bilateral upper and lower extremities.  5 out of 5 symmetric grip strength.  5 intact active plantar dorsiflexion bilaterally.  CN III through XII grossly intact.  Psychiatric:        Behavior: Behavior normal.     ED Results / Procedures / Treatments   Labs (all labs ordered are listed, but only abnormal results are displayed) Labs Reviewed  CBC WITH DIFFERENTIAL/PLATELET  LIPID PANEL  COMPREHENSIVE METABOLIC PANEL  TSH  TROPONIN I (HIGH SENSITIVITY)    EKG None  Radiology DG Chest Portable 1 View  Result Date: 01/25/2020 CLINICAL DATA:  Chest pain and palpitations. Atrial fibrillation. History of hypertension. EXAM: PORTABLE CHEST 1 VIEW COMPARISON:  CT 08/09/2018 FINDINGS: Shallow inspiration. Heart size and pulmonary vascularity are normal. No airspace disease or consolidation in the lungs. Linear fibrosis or atelectasis in the right mid lung. No pleural effusions. No pneumothorax. Mediastinal contours appear intact. No change. IMPRESSION: Linear fibrosis or atelectasis in the right mid lung. No evidence of active pulmonary disease. Electronically Signed   By: Lucienne Capers M.D.   On: 01/25/2020 05:20    Procedures Procedures (including critical care time)  Medications Ordered in ED Medications  acetaminophen (TYLENOL) tablet 650 mg (has no administration in time range)  alum & mag hydroxide-simeth (MAALOX/MYLANTA) 200-200-20 MG/5ML suspension 30 mL (has no administration in time range)  magnesium hydroxide (MILK OF MAGNESIA) suspension 30 mL (has no administration in time range)  traZODone (DESYREL) tablet 50 mg (50 mg Oral Given 01/24/20 2059)  OLANZapine zydis (ZYPREXA) disintegrating tablet 5 mg (has no administration in time range)    And  ziprasidone (GEODON) injection 20 mg (has no administration in time range)  albuterol (VENTOLIN HFA) 108 (90 Base)  MCG/ACT inhaler 1  puff (has no administration in time range)  naproxen (NAPROSYN) tablet 500 mg (has no administration in time range)  ALPRAZolam Duanne Moron) tablet 0.5 mg (0.5 mg Oral Given 01/24/20 2059)  DULoxetine (CYMBALTA) DR capsule 60 mg (has no administration in time range)  lisinopril (ZESTRIL) tablet 10 mg (has no administration in time range)  OLANZapine zydis (ZYPREXA) disintegrating tablet 5 mg (has no administration in time range)  pantoprazole (PROTONIX) EC tablet 40 mg (has no administration in time range)  thiamine tablet 100 mg (has no administration in time range)  multivitamin with minerals tablet 1 tablet (1 tablet Oral Not Given 01/24/20 1906)  hydrOXYzine (ATARAX/VISTARIL) tablet 25 mg (has no administration in time range)  loperamide (IMODIUM) capsule 2-4 mg (has no administration in time range)  ondansetron (ZOFRAN-ODT) disintegrating tablet 4 mg (has no administration in time range)  LORazepam (ATIVAN) tablet 1 mg (has no administration in time range)  hydrOXYzine (ATARAX/VISTARIL) tablet 25 mg (has no administration in time range)    ED Course  I have reviewed the triage vital signs and the nursing notes.  Pertinent labs & imaging results that were available during my care of the patient were reviewed by me and considered in my medical decision making (see chart for details).  MDM Rules/Calculators/A&P                         Patient presents to the ED with complaints of palpitations.  She is nontoxic, resting comfortably, her vitals are without significant abnormality.  She appears to be in A. fib, rate controlled at rest.  No focal neurologic deficits, based on tingling in the lips and the bilateral fingertips distribution does not seem consistent with stroke.  Per chart review patient had a episode of A. fib back in 2012, and has not had a recurrent episode, she is not seen by cardiology, she is not on a rate control agent or anticoagulation. CHA2DS2-VASc score  2-patient being considered for anticoagulation, however on my initial assessment the patient she does not participate in interview much, concern for safety/stability to be on anticoagulation, will reassess.  Additional history obtained:  Additional history obtained from chart review nursing note reviewed. EKG: afib Lab Tests:  I Ordered, reviewed, and interpreted labs, which included:  CBC, CMP, troponin, TSH- no anemia, significant thyroid dysfunction, or significant electrolyte derangement. Mild hypokalemia- oral replacement ordered.   Imaging Studies ordered:  I ordered imaging studies which included CXR, I independently visualized and interpreted imaging which showed Linear fibrosis or atelectasis in the right mid lung. No evidence of active pulmonary disease  We will give patient a dose of oral metoprolol and continue to observe in the emergency department.  Given she has a history of A. fib, is not a reliable historian, and does not appear hemodynamically unstable do not feel she needs emergent cardioversion at this time.  We discussed possible anticoagulation, patient does report having a GI bleed previously but is unsure what the severity of this was.  Will defer anticoagulation at this time.  Patient care signed out to [facility] PA-C at change of shift pending delta troponin & discharge.  If patient remains with good rate control and no significant delta elevation anticipate discharge home on metoprolol 25 mg twice daily with referral to A. fib clinic.  Findings and plan of care discussed with supervising physician Dr. Laverta Baltimore who is in agreement.   Portions of this note were generated with [employer]. Dictation  errors may occur despite best attempts at proofreading.   Final Clinical Impression(s) / ED Diagnoses Final diagnoses:  Atrial fibrillation, unspecified type [facility])    Rx / DC Orders ED Discharge Orders    None       Amaryllis Dyke, PA-C 01/25/20  0654    Amaryllis Dyke, PA-C 01/25/20 8177    Margette Fast, MD 01/30/20 1044

## 2020-01-25 NOTE — ED Provider Notes (Signed)
9:06 AM signout from Elaine PA-C at shift change.  Patient from behavioral health where she is currently under involuntary commitment.  Seen here for palpitations.  She was found to be in atrial fibrillation.  Patient was started on metoprolol with plans to have her follow-up with the A. fib clinic.  Rate has remained controlled.  Currently awaiting repeat troponin.  If this is normal, plan to discharge back to behavioral health.  Informed by RN that patient is becoming agitated and attempting to leave.  They are requesting medications to help her calm down.  Multiple security officers at bedside.  Geodon ordered.  EKG reviewed. No QTc prolongation.   BP 120/81   Pulse 99   Temp 98.5 F (36.9 C) (Oral)   Resp 19   Ht 5' 9"  (1.753 m)   Wt 90 kg   LMP 10/12/2012 (Exact Date)   SpO2 95%   BMI 29.30 kg/m   11:42 AM Troponin 3>3.   I went and saw the patient prior to discharge.  I reiterated the plan.  She is sleepy but rousable to voice.  She voices understanding.  On monitor, rhythm is atrial fibrillation in the 70s.  D/c back to [facility].   BP 114/72   Pulse 83   Temp 97.7 F (36.5 C) (Oral)   Resp 20   Ht 5' 9"  (1.753 m)   Wt 90 kg   LMP 10/12/2012 (Exact Date)   SpO2 96%   BMI 29.30 kg/m          Carlisle Cater, PA-C 01/25/20 1142    Truddie Hidden, MD 01/25/20 1253

## 2020-01-25 NOTE — ED Notes (Signed)
Pt currently under care for SI with a plan.

## 2020-01-25 NOTE — ED Notes (Signed)
Pt is resting quietly and calmly in bed at this time, sitter remains at bedside.

## 2020-01-25 NOTE — ED Notes (Signed)
Pt given graham crackers and Ginger Ale, per Zoe - RN.

## 2020-01-26 DIAGNOSIS — F19959 Other psychoactive substance use, unspecified with psychoactive substance-induced psychotic disorder, unspecified: Secondary | ICD-10-CM | POA: Diagnosis present

## 2020-01-26 MED ORDER — METOPROLOL TARTRATE 25 MG PO TABS
25.0000 mg | ORAL_TABLET | Freq: Once | ORAL | Status: AC
  Administered 2020-01-26: 25 mg via ORAL
  Filled 2020-01-26: qty 1

## 2020-01-26 MED ORDER — METOPROLOL TARTRATE 25 MG PO TABS
25.0000 mg | ORAL_TABLET | Freq: Two times a day (BID) | ORAL | Status: DC
  Administered 2020-01-26 – 2020-01-27 (×2): 25 mg via ORAL
  Filled 2020-01-26 (×7): qty 1

## 2020-01-26 NOTE — [facility] Suicide Risk Assessment (Signed)
[place] INPATIENT:  Family/Significant Other Suicide Prevention Education  Suicide Prevention Education:  Education Completed; Sheri Calderon [phone]) husband, has been identified by the patient as the family member/significant other with whom the patient will be residing, and identified as the person(s) who will aid the patient in the event of a mental health crisis (suicidal ideations/suicide attempt).  With written consent from the patient, the family member/significant other has been provided the following suicide prevention education, prior to the and/or following the discharge of the patient.  The suicide prevention education provided includes the following:  Suicide risk factors  Suicide prevention and interventions  National Suicide Hotline telephone number  [facility] assessment telephone number  [facility] Emergency Assistance [place] and/or Residential Mobile Crisis Unit telephone number  Request made of family/significant other to:  Remove weapons (e.g., guns, rifles, knives), all items previously/currently identified as safety concern.    Remove drugs/medications (over-the-counter, prescriptions, illicit drugs), all items previously/currently identified as a safety concern.  CSW spoke with Pt's husband, Sheri Calderon. He is very supportive and has understanding of Pt's issues. He plans to ensure that Pt does not use substances further post discharge. He is hopeful she will be home soon. Husband stated that he and Sheri Calderon had a number of years sober together and he is unsure what triggered her relapse. Husband will provide transportation at discharge. He was made aware that Pt has follow-up appointment scheduled for December 2nd. He confirmed that Pt does not have access to weapons.  The family member/significant other verbalizes understanding of the suicide prevention education information provided.The family member/significant other agrees to remove the  items of safety concern listed above.  Sheri Lewis Halchita, LCSW 01/26/2020, 4:01 PM

## 2020-01-26 NOTE — Progress Notes (Signed)
   01/26/20 0608  Vital Signs  Pulse Rate (!) 108  Pulse Rate Source Monitor  BP (!) 148/110  BP Location Right Arm  BP Method Automatic  Patient Position (if appropriate) Standing  Oxygen Therapy  SpO2 98 %   D: Patient denies SI/HI/AVH. Patient denied anxiety and depression in the morning, but in the afternoon pt. Reported anxiety. Pt. Out in open areas and was social with peers and staff.  A:  Patient took scheduled medicine. Pt. Was given 25 mg of vistaril and then 0.5 mg of xanax for anxiety.  Support and encouragement provided Routine safety checks conducted every 15 minutes. Patient  Informed to notify staff with any concerns.   R: Safety maintained.
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
## 2020-01-26 NOTE — Progress Notes (Signed)
[facility] MD Progress Note  01/26/2020 2:42 PM Sheri Calderon  MRN:  [id number] Principal Problem: Psychoactive substance-induced psychosis ([place]) Diagnosis: Principal Problem:   Psychoactive substance-induced psychosis ([place]) Active Problems:   Severe manic bipolar 1 disorder with psychotic behavior ([place])   Substance induced mood disorder ([place])  Exam and ROS done during H&P but not entered into that note.  Psychiatric Specialty Exam: Physical Exam Vitals and nursing note reviewed.  Constitutional:      General: She is not in acute distress.    Appearance: Normal appearance. She is normal weight. She is not ill-appearing, toxic-appearing or diaphoretic.  HENT:     Head: Normocephalic and atraumatic.  Cardiovascular:     Rate and Rhythm: Tachycardia present.  Pulmonary:     Effort: Pulmonary effort is normal.  Musculoskeletal:        General: Normal range of motion.  Neurological:     General: No focal deficit present.     Mental Status: She is alert.     Review of Systems  Constitutional: Negative for fatigue and fever.  Respiratory: Negative for chest tightness and shortness of breath.   Cardiovascular: Positive for chest pain (Reported as anxiety symptom). Negative for palpitations.  Gastrointestinal: Negative for constipation, diarrhea, nausea and vomiting.  Neurological: Positive for headaches. Negative for dizziness, weakness and light-headedness.  Psychiatric/Behavioral: Negative for agitation, behavioral problems, hallucinations, self-injury, sleep disturbance and suicidal ideas. The patient is nervous/anxious. The patient is not hyperactive.     Blood pressure 120/89, pulse (!) 108, temperature (!) 97.4 F (36.3 C), temperature source Oral, resp. rate 18, height 5' 9"  (1.753 m), weight 90 kg, last menstrual period 10/12/2012, SpO2 98 %.Body mass index is 29.3 kg/m.    Briant Cedar, MD 01/26/2020, 2:42 PM

## 2020-01-26 NOTE — [facility] Group Notes (Signed)
Type of Therapy/Topic: Group Therapy: Emotion Regulation  Participation Level: Active  Description of Group: The purpose of this group is to assist patients in learning to regulate negative emotions and experience positive emotions. Patients will be guided to discuss ways in which they have been vulnerable to their negative emotions. These vulnerabilities will be juxtaposed with experiences of positive emotions or situations, and patients will be challenged to use positive emotions to combat negative ones. Special emphasis will be placed on coping with negative emotions in conflict situations, and patients will process healthy conflict resolution skills.  Therapeutic Goals: 1. Patient will identify two positive emotions or experiences to reflect on in order to balance out negative emotions 2. Patient will label two or more emotions that they find the most difficult to experience 3. Patient will demonstrate positive conflict resolution skills through discussion and/or role plays  Summary of Patient Progress: Patient attended and participated in group. Patient shared of recent experience where she felt disrespected and hurt. She shared she was proud of herself with how she handled the situation and was able to learn new emotion regulation techniques including diaphragmatic breathing.     Therapeutic Modalities: Cognitive Behavioral Therapy Feelings Identification Dialectical Behavioral Therapy  Darletta Moll MSW, LCSW Clincal Social Worker  [facility]

## 2020-01-26 NOTE — Progress Notes (Signed)
   01/26/20 2053  COVID-19 Daily Checkoff  Have you had a fever (temp > 37.80C/100F)  in the past 24 hours?  No  COVID-19 EXPOSURE  Have you traveled outside the state in the past 14 days? No  Have you been in contact with someone with a confirmed diagnosis of COVID-19 or PUI in the past 14 days without wearing appropriate PPE? No  Have you been living in the same home as a person with confirmed diagnosis of COVID-19 or a PUI (household contact)? No  Have you been diagnosed with COVID-19? No
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
## 2020-01-26 NOTE — Progress Notes (Signed)
Recreation Therapy Notes  INPATIENT RECREATION THERAPY ASSESSMENT  Patient Details Name: Sheri Calderon MRN: [id number] DOB: Nov 21, 1965 Today's Date: 01/26/2020       Information Obtained From: Patient  Able to Participate in Assessment/Interview: Yes  Patient Presentation: Alert  Reason for Admission (Per Patient): Substance Abuse  Patient Stressors: Other (Comment) ("not being sure of people, places or things")  Coping Skills:   Isolation, TV, Arguments, Music, Deep Breathing, Substance Abuse, Impulsivity, Talk, Prayer, Avoidance, Read, Hot Bath/Shower  Leisure Interests (2+):  Social - Family, Individual - Other (Comment), Community - Other (Comment) (Ride motorcycles; spend time with yorkie)  Frequency of Recreation/Participation:  (Yorkie- Daily; Motorcycle- Twice last year; Daughter/grandson- been a while; Father- Recently)  Awareness of Community Resources:  Yes  Community Resources:  Engineer, drilling, [place], Special educational needs teacher (Comment) [employer])  Current Use: Yes  If no, Barriers?:    Expressed Interest in [place]: No  [employer] of Residence:  Investment banker, corporate  Patient Main Form of Transportation: Musician  Patient Strengths:  Motherly to any form of life; responsible in maintaining account for father; cook; clean; help others  Patient Identified Areas of Improvement:  "putting too much trust in people"  Patient Goal for Hospitalization:  "to acquire tools I need; not fall weak to drugs again"  Current SI (including self-harm):  No  Current HI:  No  Current AVH: No  Staff Intervention Plan: Group Attendance, Collaborate with Interdisciplinary Treatment Team  Consent to Intern Participation: N/A    Victorino Sparrow, LRT/CTRS  Victorino Sparrow A  01/26/2020, 11:55 AM

## 2020-01-26 NOTE — Tx Team (Signed)
Interdisciplinary Treatment and Diagnostic Plan Update  01/26/2020 Time of Session: 9:05am Sheri Calderon MRN: [id number]  Principal Diagnosis: <principal problem not specified>  Secondary Diagnoses: Active Problems:   Severe manic bipolar 1 disorder with psychotic behavior (H[facility])   Substance induced mood disorder ([facility])   Current Medications:  Current Facility-Administered Medications  Medication Dose Route Frequency Provider Last Rate Last Admin  . acetaminophen (TYLENOL) tablet 650 mg  650 mg Oral Q6H PRN Taylor, Cody W, PA-C      . albuterol (VENTOLIN HFA) 108 (90 Base) MCG/ACT inhaler 1 puff  1 puff Inhalation Q6H PRN Rankin, Shuvon B, NP   1 puff at 01/26/20 0228  . ALPRAZolam (XANAX) tablet 0.5 mg  0.5 mg Oral TID PRN Taylor, Cody W, PA-C   0.5 mg at 01/25/20 1700  . alum & mag hydroxide-simeth (MAALOX/MYLANTA) 200-200-20 MG/5ML suspension 30 mL  30 mL Oral Q4H PRN Taylor, Cody W, PA-C      . DULoxetine (CYMBALTA) DR capsule 60 mg  60 mg Oral Daily Taylor, Cody W, PA-C      . hydrOXYzine (ATARAX/VISTARIL) tablet 25 mg  25 mg Oral Q6H PRN Sykes, Janet E, NP   25 mg at 01/26/20 0638  . [START ON 01/27/2020] hydrOXYzine (ATARAX/VISTARIL) tablet 25 mg  25 mg Oral TID PRN Laubach, Katherine S, MD      . lisinopril (ZESTRIL) tablet 10 mg  10 mg Oral Daily Taylor, Cody W, PA-C   10 mg at 01/26/20 0639  . loperamide (IMODIUM) capsule 2-4 mg  2-4 mg Oral PRN Sykes, Janet E, NP      . LORazepam (ATIVAN) tablet 1 mg  1 mg Oral Q6H PRN Sykes, Janet E, NP      . magnesium hydroxide (MILK OF MAGNESIA) suspension 30 mL  30 mL Oral Daily PRN Taylor, Cody W, PA-C      . multivitamin with minerals tablet 1 tablet  1 tablet Oral Daily Sykes, Janet E, NP   1 tablet at 01/25/20 0816  . naproxen (NAPROSYN) tablet 500 mg  500 mg Oral BID PRN Rankin, Shuvon B, NP      . OLANZapine zydis (ZYPREXA) disintegrating tablet 5 mg  5 mg Oral Q8H PRN Taylor, Cody W, PA-C   5 mg at 01/25/20 1700   And  .  ziprasidone (GEODON) injection 20 mg  20 mg Intramuscular PRN Taylor, Cody W, PA-C      . OLANZapine zydis (ZYPREXA) disintegrating tablet 5 mg  5 mg Oral Daily Taylor, Cody W, PA-C      . ondansetron (ZOFRAN-ODT) disintegrating tablet 4 mg  4 mg Oral Q6H PRN Sykes, Janet E, NP      . pantoprazole (PROTONIX) EC tablet 40 mg  40 mg Oral Daily Taylor, Cody W, PA-C   40 mg at 01/25/20 0816  . thiamine tablet 100 mg  100 mg Oral Daily Sykes, Janet E, NP   100 mg at 01/25/20 0816  . traZODone (DESYREL) tablet 50 mg  50 mg Oral QHS PRN Taylor, Cody W, PA-C   50 mg at 01/24/20 2059   PTA Medications: Medications Prior to Admission  Medication Sig Dispense Refill Last Dose  . albuterol (VENTOLIN HFA) 108 (90 Base) MCG/ACT inhaler Inhale 1 puff into the lungs every 6 (six) hours as needed for wheezing or shortness of breath.   Past Month at Unknown time  . ALPRAZolam (XANAX) 0.5 MG tablet Take 0.5 mg by mouth 3 (three) times daily as needed for   anxiety.  2 01/24/2020  . cyclobenzaprine (FLEXERIL) 10 MG tablet Take 10 mg by mouth 3 (three) times daily as needed for muscle spasms.   01/24/2020  . DULoxetine (CYMBALTA) 60 MG capsule Take 60 mg by mouth daily.    01/24/2020  . lisinopril (ZESTRIL) 20 MG tablet Take 20 mg by mouth daily.   01/24/2020  . naproxen (NAPROSYN) 500 MG tablet Take 500 mg by mouth 2 (two) times daily as needed for moderate pain.   PRN  . omeprazole (PRILOSEC) 20 MG capsule Take 20 mg by mouth daily.   01/24/2020  . pregabalin (LYRICA) 150 MG capsule Take 150 mg by mouth 2 (two) times daily.   01/24/2020  . rizatriptan (MAXALT) 10 MG tablet Take 10 mg by mouth See admin instructions. Take 10 mg by mouth at onset of headache. Repeat in 2 hours if needed. Maximum 2 tablets in 24 hours.  3 Past Week at Unknown time  . ondansetron (ZOFRAN) 8 MG tablet Take 8 mg by mouth every 8 (eight) hours as needed for nausea or vomiting. (Patient not taking: Reported on 01/25/2020)   Not Taking at  Unknown time  . simethicone (MYLICON) 80 MG chewable tablet Chew 1 tablet (80 mg total) by mouth 4 (four) times daily as needed for flatulence. (Patient not taking: Reported on 01/25/2020) 30 tablet 0 Not Taking at Unknown time    Patient Stressors:    Patient Strengths:    Treatment Modalities: Medication Management, Group therapy, Case management,  1 to 1 session with clinician, Psychoeducation, Recreational therapy.   Physician Treatment Plan for Primary Diagnosis: <principal problem not specified> Long Term Goal(s):     Short Term Goals:    Medication Management: Evaluate patient's response, side effects, and tolerance of medication regimen.  Therapeutic Interventions: 1 to 1 sessions, Unit Group sessions and Medication administration.  Evaluation of Outcomes: Not Met  Physician Treatment Plan for Secondary Diagnosis: Active Problems:   Severe manic bipolar 1 disorder with psychotic behavior (HCC)   Substance induced mood disorder (H[facility])  Long Term Goal(s):     Short Term Goals:       Medication Management: Evaluate patient's response, side effects, and tolerance of medication regimen.  Therapeutic Interventions: 1 to 1 sessions, Unit Group sessions and Medication administration.  Evaluation of Outcomes: Not Met   RN Treatment Plan for Primary Diagnosis: <principal problem not specified> Long Term Goal(s): Knowledge of disease and therapeutic regimen to maintain health will improve  Short Term Goals: Ability to remain free from injury will improve, Ability to verbalize frustration and anger appropriately will improve, Ability to disclose and discuss suicidal ideas, Ability to identify and develop effective coping behaviors will improve and Compliance with prescribed medications will improve  Medication Management: RN will administer medications as ordered by provider, will assess and evaluate patient's response and provide education to patient for prescribed medication.  RN will report any adverse and/or side effects to prescribing provider.  Therapeutic Interventions: 1 on 1 counseling sessions, Psychoeducation, Medication administration, Evaluate responses to treatment, Monitor vital signs and CBGs as ordered, Perform/monitor CIWA, COWS, AIMS and Fall Risk screenings as ordered, Perform wound care treatments as ordered.  Evaluation of Outcomes: Not Met   LCSW Treatment Plan for Primary Diagnosis: <principal problem not specified> Long Term Goal(s): Safe transition to appropriate next level of care at discharge, Engage patient in therapeutic group addressing interpersonal concerns.  Short Term Goals: Engage patient in aftercare planning with referrals and resources, Increase social support,   Facilitate patient progression through stages of change regarding substance use diagnoses and concerns, Identify triggers associated with mental health/substance abuse issues and Increase skills for wellness and recovery  Therapeutic Interventions: Assess for all discharge needs, 1 to 1 time with Social worker, Explore available resources and support systems, Assess for adequacy in community support network, Educate family and significant other(s) on suicide prevention, Complete Psychosocial Assessment, Interpersonal group therapy.  Evaluation of Outcomes: Not Met   Progress in Treatment: Attending groups: No. Participating in groups: No. Taking medication as prescribed: Yes. Toleration medication: Yes. Family/Significant other contact made: No, will contact:  if consent is given Patient understands diagnosis: Yes. Discussing patient identified problems/goals with staff: Yes. Medical problems stabilized or resolved: Yes. Denies suicidal/homicidal ideation: Yes. Issues/concerns per patient self-inventory: No.   New problem(s) identified: No, Describe:  none  New Short Term/Long Term Goal(s): medication stabilization, elimination of SI thoughts, development of  comprehensive mental wellness plan.   Patient Goals:  "To get off of drugs. To get clarity"   Discharge Plan or Barriers:  Patient recently admitted. CSW will continue to follow and assess for appropriate referrals and possible discharge planning.   Reason for Continuation of Hospitalization: Anxiety Depression Medication stabilization  Estimated Length of Stay: 1-3 days  Attendees: Patient: Sheri Calderon 01/26/2020   Physician: Katherine Laubach, MD 01/26/2020   Nursing:  01/26/2020   RN Care Manager: 01/26/2020   Social Worker:  , LCSW 01/26/2020   Recreational Therapist:  01/26/2020   Other:  01/26/2020   Other:  01/26/2020   Other: 01/26/2020     Scribe for Treatment Team:  A , LCSW 01/26/2020 10:52 AM 

## 2020-01-26 NOTE — Progress Notes (Signed)
The focus of this group is to help patients establish daily goals to achieve during treatment and discuss how the patient can incorporate goal setting into their daily lives to aide in recovery. The patient  attemd group and made comments about her situation.
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
## 2020-01-26 NOTE — Progress Notes (Signed)
Adult Psychoeducational Group Note  Date:  01/26/2020 Time:  9:07 PM  Group Topic/Focus:  Wrap-Up Group:   The focus of this group is to help patients review their daily goal of treatment and discuss progress on daily workbooks.  Participation Level:  Active  Participation Quality:  Appropriate  Affect:  Appropriate  Cognitive:  Appropriate  Insight: Appropriate  Engagement in Group:  Developing/Improving  Modes of Intervention:  Discussion  Additional Comments:  Pt stated her goal for today was to focus on her treatment plan and talk with her doctor about her medication issue. Pt stated she felt she accomplished her goals today. Pt stated she talk with her doctor and social worker, regarding her care today. Pt stated the medication she uses for her heart issues was added to her list. Pt stated she took all her medication today from her providers. Pt stated been able to contact her father, brother, husband, and daughter today improved her overall day. Pt stated her relationship with her family and support team has improved since she was admitted here. Pt rated her overall day a 10 today. Pt stated she felt better about herself today. Pt stated her appetite was pretty good today and she attended all meals. Pt stated her sleep last night was good. Pt stated the goal for tonight was to get some rest. Pt stated she was in no physical pain. Pt deny auditory or visual hallucinations. Pt denies thoughts of harming herself or others. Pt stated she would alert staff if anything changes.  Candy Sledge 01/26/2020, 9:07 PM

## 2020-01-27 ENCOUNTER — Other Ambulatory Visit: Payer: Self-pay | Admitting: Student in an Organized Health Care Education/Training Program

## 2020-01-27 MED ORDER — LISINOPRIL 10 MG PO TABS: 10.0000 mg | ORAL_TABLET | Freq: Every day | ORAL | 0 refills | Status: DC

## 2020-01-27 NOTE — Progress Notes (Signed)
Pt is currently resting in bed with her eyes closed. Her respirations are even and unlabored. No distress has been observed. Q 15 min safety checks continue. Pt's safety has been maintained.

## 2020-01-27 NOTE — B[facility] Suicide Risk Assessment (Signed)
[facility] Discharge Suicide Risk Assessment   Principal Problem: Substance induced mood disorder [facility]) Discharge Diagnoses: Principal Problem:   Substance induced mood disorder ([place])   Total Time spent with patient: 35 minutes   Patient is seen and evaluated.  She relates the circumstances related to her admission after becoming addicted to methamphetamine in the context of her brother's death and, "needing to take care of everything."  She states that since she has been in the hospital she has learned that she needs to reach out for help instead of turning to drugs.  She states that she is the oldest sibling with 4 younger brothers, the youngest of which passed away Dec 24, 2019 from double aneurysms while awaiting surgery.  She reports that she has "been mom" for her brothers since her mother's passing 7 years ago.  Patient has a husband and a 21 year old daughter who are supportive.  Patient states a sense of relief that she was pulled over by the police and her addiction issues and grief reaction/mood lability has been brought to light.  She is aware of traffic violations and consequences and is able to handle these.  She denies any suicidal or homicidal ideation.  She denies any auditory or visual hallucinations.  She does not have access to weapons.  She is looking forward to discharge, starting therapy, and being able to travel with her husband for work.  Musculoskeletal: Strength & Muscle Tone: within normal limits Gait & Station: normal Patient leans: N/A  Psychiatric Specialty Exam: Physical Exam Vitals and nursing note reviewed.  Constitutional:      General: She is not in acute distress.    Appearance: Normal appearance. She is normal weight. She is not ill-appearing, toxic-appearing or diaphoretic.  HENT:     Head: Normocephalic and atraumatic.  Cardiovascular:     Normal rate Pulmonary:     Effort: Pulmonary effort is normal.  Musculoskeletal:        General: Normal range of  motion.  Neurological:     General: No focal deficit present.     Mental Status: She is alert.     Review of Systems  Constitutional: Negative for fatigue and fever.  Respiratory: Negative for chest tightness and shortness of breath.   Cardiovascular: Negative for chest pain and palpitations.  Gastrointestinal: Negative for constipation, diarrhea, nausea and vomiting.  Neurological: Negative for dizziness, weakness, light-headedness and headaches.  Psychiatric/Behavioral: Negative for agitation, behavioral problems, hallucinations, self-injury, sleep disturbance and suicidal ideas. The patient is not nervous/anxious and is not hyperactive.     Blood pressure 126/85, pulse 70, temperature (!) 97.3 F (36.3 C), temperature source Oral, resp. rate 20, height 5' 9"  (1.753 m), weight 90 kg, last menstrual period 10/12/2012, SpO2 99 %.Body mass index is 29.3 kg/m.  General Appearance: Casual  Eye Contact:  Good  Speech:  Clear and Coherent and Normal Rate  Volume:  Normal  Mood:  Appropriate  Affect:  Appropriate  Thought Process:  Coherent and Goal Directed  Orientation:  Full (Time, Place, and Person)  Thought Content:  Logical  Suicidal Thoughts:  No  Homicidal Thoughts:  No  Memory:  Immediate;   Good Recent;   Good  Judgement:  Good  Insight:  Good  Psychomotor Activity:  Normal  Concentration:  Concentration: Good and Attention Span: Good  Recall:  Good  Fund of Knowledge:  Good  Language:  Good  Akathisia:  No  Handed:  Right  AIMS (if indicated):     Assets:  Communication Skills Desire for Improvement Housing Resilience Social Support  ADL's:  Intact  Cognition:  WNL   Mental Status Per Nursing Assessment::   On Admission:  NA psychosis  Demographic Factors:  Caucasian  Loss Factors: Death of youngest brother Dec 12, 2019  Historical Factors: Family history of suicide and Family history of mental illness or substance abuse  Grandmother: Suicide Mother:  "psychotic"  Risk Reduction Factors:   Sense of responsibility to family, Religious beliefs about death, Living with another person, especially a relative, Positive social support, Positive therapeutic relationship and Positive coping skills or problem solving skills  Continued Clinical Symptoms:  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  Patients presenting with no risk factors but with morbid ruminations; may be classified as minimal risk based on the severity of the depressive symptoms   Follow-up Gregory.   Specialty: Emergency Medicine Why: As needed, If symptoms worsen Contact information: [address] [id number] [place] In 2 days.   Specialty: Cardiology Contact information: [address] [id number] mc [place]. Call on 02/01/2020.   Why: You have a VIRTUAL appointment on December 2nd at [facility] for medication management.  Contact information: [place] [address] [phone]               Plan Of Care/Follow-up recommendations:  Activity:  ad lib Diet:  as tolerated Other:  Ensure follow-up care with outpatient psychiatrist and therapist   On day of discharge following sustained improvement in the affect of this patient, continued report of euthymic mood, repeated denial of suicidal, homicidal, and other violent ideation, adequate interaction with peers, active participation in groups while on the unit, and denial of adverse reactions from medications, the treatment team decided [facility]LL was stable for discharge home with scheduled mental  health treatment as noted above.  She was able to engage in safety planning including plan to return to nearest emergency room or contact emergency services if she feels unable to maintain her own safety or the safety of others. Patient had no further questions, comments, or concerns.  Discharge into care of husband, who agrees to maintain patient safety.  Patient aware to return to nearest crisis center, ED or to call 911 for worsening symptoms of depression, suicidal or homicidal thoughts or AVH.    Lavella Hammock, MD 01/27/2020, 1:21 PM
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
## 2020-01-27 NOTE — Progress Notes (Signed)
Pt woke up and came to the nurses station complaining of her heart racing, chest tightness, and numbness/tingling in her fingers. Pt appeared very anxious and was assisted to a chair. Her vital signs were assessed: pulse 123 and blood pressure 150/111 at 0157. Pt was administered her PRN 0.5 mg xanax at 0159 and albuterol inhaler at 0211. Pt was walked back to her room and she laid down to rest. Vitals were reassessed at 0216, blood pressure was 118/79, pulse 92, and O2 98% on room air. Pt said that she feels a little better now. No signs of distress noted. Pt was snoring at this time when nurse assessed her. PA, Ileene Musa was notified. No new orders at this time.

## 2020-01-27 NOTE — Progress Notes (Signed)
  [facility] Adult Case Management Discharge Plan :  Will you be returning to the same living situation after discharge:  Yes,  with husband At discharge, do you have transportation home?: Yes,  husband to transport Do you have the ability to pay for your medications: Yes,  insurance and income  Release of information consent forms completed and emailed to Medical Records, then turned in to Medical Records by CSW.   Patient to Follow up at:  Follow-up Information    [facility].   Specialty: Emergency Medicine Why: As needed, If symptoms worsen Contact information: [address] [phone] [place] In 2 days.   Specialty: Cardiology Contact information: [address] [phone] mc [place]. Call on 02/01/2020.   Why: You have a VIRTUAL appointment on December 2nd at [facility] for medication management.  Contact information: [place] [address] [phone]               Next level of care provider has access to [place] and Suicide Prevention discussed: Yes,  with husband  Have you used any form of tobacco in the last 30 days? (Cigarettes, Smokeless Tobacco, Cigars, and/or Pipes): Patient Refused Screening  Patient states she smokes a little, but does not intend to restart once discharged from the hospital  Has patient been referred to the Quitline?: Patient refused referral  Patient has been referred for addiction treatment: Pt. refused referral  Maretta Los, LCSW 01/27/2020, 9:53 AM

## 2020-01-27 NOTE — BHH Group Notes (Signed)
  [facility] LCSW Group Therapy Note  Date/Time:  01/27/2020 11:15AM-12:00PM  Type of Therapy and Topic:  Group Therapy:  Feelings About Hospitalization  Participation Level:  Active   Description of Group This process group involved patients discussing their feelings related to being hospitalized, as well as the benefits they see to being in the hospital.  These feelings and benefits were itemized.  The group then brainstormed specific ways in which they could seek those same benefits when they discharge and return home.  Therapeutic Goals Patient will identify and describe positive and negative feelings related to hospitalization Patient will verbalize benefits of hospitalization to themselves personally Patients will brainstorm together ways they can obtain similar benefits in the outpatient setting, identify barriers to wellness and possible solutions  Summary of Patient Progress:  The patient expressed her primary feelings about being hospitalized are that she feels more educated on what she was doing and how to be sober.  She has some anxiety in the hospital because she does not know what other patients' situations are.  She stated that she needs to put her tools first and set her own judgment aside because it didn't help her in the past.  She said that she has no more shame about her addiction because she has told everyone, and she has called everyone in her family today and told them thank you for getting her to face up to the fact that she needed help.  Therapeutic Modalities Cognitive Behavioral Therapy Motivational Interviewing    Selmer Dominion, LCSW 01/27/2020, 12:27 PM

## 2020-01-27 NOTE — Progress Notes (Signed)
Pt has been social and out in the dayroom. She also attended group earlier tonight. She shares that she was using meth since July, but it turned out to be cocaine that was being provided by a family friend. Her family thought that she was experiencing grief from the loss from her brother that passed away, but it was from the polysubstance abuse. She shares that she was petitioned by her brother and acknowledges now that he did the right thing. Her goal is to stay away from drugs now. Pt appeared happy tonight, although anxious and fidgety. She was happy because she was able to talk to her daughter today. Pt rated her anxiety a 0 tonight on a scale of 0-10 (10 being the worse). During medication administration, pt started to complain of anxiety and was informed that it was too early for her to receive any other PRN's. She appears to be med seeking, she was asking if she had [place]. Encouraged pt to practice her coping skills and rest. Pt denies SI/HI and AVH. Active listening, reassurance, and support provided. Medications administered as ordered by MD. Q 15 min safety checks continue. Pt's safety has been maintained.   01/26/20 2053  Psych Admission Type (Psych Patients Only)  Admission Status Involuntary  Psychosocial Assessment  Patient Complaints Anxiety  Eye Contact Fair  Facial Expression Animated;Anxious  Affect Anxious;Appropriate to circumstance  Speech Logical/coherent  Interaction Assertive  Motor Activity Fidgety  Appearance/Hygiene Unremarkable  Behavior Characteristics Cooperative;Anxious;Fidgety  Mood Anxious;Pleasant  Thought Process  Coherency Concrete thinking  Content WDL  Delusions None reported or observed  Perception WDL  Hallucination None reported or observed  Judgment Poor  Confusion None  Danger to Self  Current suicidal ideation? Denies  Danger to Others  Danger to Others None reported or observed

## 2020-01-27 NOTE — Progress Notes (Signed)
Pt is complaining of a headache. She is requesting a medication called "rizalatine" which she said she normally takes for her headaches. Informed pt that the only two PRN medications that she has for pain are tylenol and naproxen. Pt said that she has received that medication here before. Pt seemed irritable when I informed her that she has no order for it. Pt took 650 mg of tylenol at 0525.

## 2020-01-27 NOTE — Progress Notes (Signed)
D: Pt A&Ox 3. Denies SI, HI, AVH and pain at this time. D/C home as ordered. Picked up in lobby by her husband. A: D/C instructions reviewed w/ pt including prescriptions and follow up appointments, compliance encouraged. All belongings from locker 34 given to pt at time of departure. Scheduled medications given w/verbal education and effects monitored. Safety checks maintained without incident until time of D/C. R: Pt receptive to care. Compliant w/medications when offered. Denies adverse drug reactions when reassessed. Pt signed her belonging sheet in agreement w/ items received from locker. Ambulatory with steady gait. Appears to be in no physical distress at time of departure.

## 2020-01-29 ENCOUNTER — Other Ambulatory Visit: Payer: Self-pay

## 2020-01-29 ENCOUNTER — Encounter (HOSPITAL_COMMUNITY): Payer: Self-pay

## 2020-01-29 ENCOUNTER — Emergency Department (HOSPITAL_COMMUNITY)
Admission: EM | Admit: 2020-01-29 | Discharge: 2020-01-29 | Disposition: A | Discharge status: Home / Self Care | Payer: Medicare Other | Attending: Emergency Medicine | Admitting: Emergency Medicine

## 2020-01-29 ENCOUNTER — Emergency Department (HOSPITAL_COMMUNITY): Payer: Medicare Other

## 2020-01-29 DIAGNOSIS — R079 Chest pain, unspecified: Secondary | ICD-10-CM | POA: Diagnosis not present

## 2020-01-29 DIAGNOSIS — Z5321 Procedure and treatment not carried out due to patient leaving prior to being seen by health care provider: Secondary | ICD-10-CM | POA: Diagnosis not present

## 2020-01-29 DIAGNOSIS — R202 Paresthesia of skin: Secondary | ICD-10-CM | POA: Diagnosis not present

## 2020-01-29 DIAGNOSIS — F419 Anxiety disorder, unspecified: Secondary | ICD-10-CM | POA: Insufficient documentation

## 2020-01-29 LAB — CBC
HCT: 41.4 % (ref 36.0–46.0)
Hemoglobin: 13.4 g/dL (ref 12.0–15.0)
MCH: 28.8 pg (ref 26.0–34.0)
MCHC: 32.4 g/dL (ref 30.0–36.0)
MCV: 89 fL (ref 80.0–100.0)
Platelets: 406 10*3/uL — ABNORMAL HIGH (ref 150–400)
RBC: 4.65 MIL/uL (ref 3.87–5.11)
RDW: 12.6 % (ref 11.5–15.5)
WBC: 6.3 10*3/uL (ref 4.0–10.5)
nRBC: 0 % (ref 0.0–0.2)

## 2020-01-29 LAB — I-STAT BETA HCG BLOOD, ED (MC, WL, AP ONLY): I-stat hCG, quantitative: <5 m[IU]/mL (ref <5)

## 2020-01-29 LAB — BASIC METABOLIC PANEL
Anion gap: 13 (ref 5–15)
BUN: 19 mg/dL (ref 6–20)
CO2: 21 mmol/L — ABNORMAL LOW (ref 22–32)
Calcium: 9.3 mg/dL (ref 8.9–10.3)
Chloride: 106 mmol/L (ref 98–111)
Creatinine, Ser: 1.07 mg/dL — ABNORMAL HIGH (ref 0.44–1.00)
GFR, Estimated: >60 mL/min (ref >60)
Glucose, Bld: 109 mg/dL — ABNORMAL HIGH (ref 70–99)
Potassium: 4.2 mmol/L (ref 3.5–5.1)
Sodium: 140 mmol/L (ref 135–145)

## 2020-01-29 LAB — TROPONIN I (HIGH SENSITIVITY): Troponin I (High Sensitivity): 4 ng/L (ref <18)

## 2020-01-29 NOTE — ED Notes (Signed)
Pt told sort she felt relief of her symptoms and preferred to make a follow up appt with her cardiologist. Pt then asked to leave.

## 2020-01-29 NOTE — ED Triage Notes (Signed)
Pt reports chest pain and arm numbness after getting into an argument with her husband this morning. Pt anxious in triage, recently seen here for similar symptoms.

## 2020-02-10 ENCOUNTER — Encounter (HOSPITAL_COMMUNITY): Payer: Self-pay | Admitting: Emergency Medicine

## 2020-02-10 ENCOUNTER — Ambulatory Visit (HOSPITAL_COMMUNITY)
Admission: EM | Admit: 2020-02-10 | Discharge: 2020-02-10 | Disposition: A | Discharge status: Home / Self Care | Payer: Medicare Other | Attending: Family Medicine | Admitting: Family Medicine

## 2020-02-10 ENCOUNTER — Other Ambulatory Visit: Payer: Self-pay

## 2020-02-10 DIAGNOSIS — R109 Unspecified abdominal pain: Secondary | ICD-10-CM

## 2020-02-10 DIAGNOSIS — R1110 Vomiting, unspecified: Secondary | ICD-10-CM

## 2020-02-10 MED ORDER — ONDANSETRON HCL 4 MG/2ML IJ SOLN
INTRAMUSCULAR | Status: AC
  Filled 2020-02-10: qty 2

## 2020-02-10 MED ORDER — KETOROLAC TROMETHAMINE 30 MG/ML IJ SOLN
30.0000 mg | Freq: Once | INTRAMUSCULAR | Status: AC
  Administered 2020-02-10: 16:00:00 30 mg via INTRAMUSCULAR

## 2020-02-10 MED ORDER — ONDANSETRON HCL 4 MG/2ML IJ SOLN
2.0000 mg | Freq: Once | INTRAMUSCULAR | Status: AC
  Administered 2020-02-10: 16:00:00 2 mg via INTRAMUSCULAR

## 2020-02-10 MED ORDER — KETOROLAC TROMETHAMINE 30 MG/ML IJ SOLN
INTRAMUSCULAR | Status: AC
  Filled 2020-02-10: qty 1

## 2020-02-10 NOTE — ED Notes (Signed)
Patient is being discharged from the Urgent Care and sent to the Emergency Department via private vehicle . Per Lavell Anchors, NP, patient is in need of higher level of care due to abdominal pain, further examination. Patient is aware and verbalizes understanding of plan of care.  Vitals:   02/10/20 1512 02/10/20 1514  BP: 133/77   Pulse: 79   Resp:  20  Temp: 97.7 F (36.5 C)   SpO2: 99%

## 2020-02-10 NOTE — Discharge Instructions (Addendum)
Go immediately to the ER for evaluation for further evaluation of your severe abdominal pain as this is described urgent care to evaluate.

## 2020-02-10 NOTE — ED Triage Notes (Signed)
Pt states that she has abdominal pain and hasn't been able to eat in three days. Pt states that she has vomiting and diarrhea. Pt sx started three days ago.

## 2020-02-16 ENCOUNTER — Ambulatory Visit: Payer: Medicare Other | Admitting: Cardiology

## 2020-02-16 ENCOUNTER — Other Ambulatory Visit: Payer: Self-pay

## 2020-02-16 ENCOUNTER — Encounter: Payer: Self-pay | Admitting: Cardiology

## 2020-02-16 VITALS — BP 151/95 | HR 74 | Resp 16 | Ht 67.0 in | Wt 204.0 lb

## 2020-02-16 DIAGNOSIS — R0609 Other forms of dyspnea: Secondary | ICD-10-CM

## 2020-02-16 DIAGNOSIS — R002 Palpitations: Secondary | ICD-10-CM

## 2020-02-16 DIAGNOSIS — R0683 Snoring: Secondary | ICD-10-CM

## 2020-02-16 DIAGNOSIS — Z720 Tobacco use: Secondary | ICD-10-CM

## 2020-02-16 DIAGNOSIS — I10 Essential (primary) hypertension: Secondary | ICD-10-CM

## 2020-02-16 DIAGNOSIS — F17200 Nicotine dependence, unspecified, uncomplicated: Secondary | ICD-10-CM

## 2020-02-16 DIAGNOSIS — I480 Paroxysmal atrial fibrillation: Secondary | ICD-10-CM

## 2020-02-16 MED ORDER — LISINOPRIL 20 MG PO TABS: 40.0000 mg | ORAL_TABLET | Freq: Every day | ORAL | 0 refills | Status: DC

## 2020-02-16 NOTE — Progress Notes (Signed)
Patient referred by Alvester Chou, NP for new onset atrial fibrillation.  Subjective:   Sheri Calderon, female    DOB: May 18, 1965, 54 y.o.   MRN: [id number]   Chief Complaint  Patient presents with  . Atrial Fibrillation  . New Patient (Initial Visit)    Referred by Alvester Chou, DNP     HPI  54 y.o. occasion female with history of hypertension, palpitations, anxiety, bipolar 1 disorder, tobacco use, polysubstance abuse, and colon cancer (2014). She has family history of early CAD.  Referred to our office by her PCP Alvester Chou for evaluation of new onset atrial fibrillation.  Denies history of MI, TIA, CVA, diabetes mellitus, hyperlipidemia.   Patient was recently hospitalized 01/23/2020 for suicidal ideation after discontinuing her psychiatric medications. At this time her toxicology screen was positive for amphetamines. She was being treated in inpatient behavioral health when on 01/24/2020 she experienced palpitations with associated chest pain and dyspnea. She was transported to emergency department and found to be in atrial fibrillation with RVR. She was given metoprolol tartrate and remained well rate controlled, then spontaneously converted to sinus rhythm. She was discharged back to behavioral health with metoprolol tartrate 25 mg twice daily. Patient reports a previous remote history of atrial fibrillation about 8 years ago. She presented back to ED on 02/10/2020, she was not seen but EKG revealed she was back in atrial fibrillation.   Patient reports since discharge on 01/27/2020 patient has continued to have intermittent palpitations with associated dyspnea. She also reports snoring and daytime fatigue. She is without formal exercise routine. She reports a poor diet. She smokes 1/2 ppd x20 years.    Past Medical History:  Diagnosis Date  . Allergic rhinitis   . Anxiety   . Atrial fibrillation ([place])    ~ 06/2010 single episdose, evaluted at [facility] with testing. No  reccurence. Does not see cardiologist (08/01/2018)  . Colon cancer [facility]) 2014   cancer of the sigmoid colon  . Depression   . Diverticulitis    "this is my 2nd time in hospital w/this in the last 2 wk" (06/21/2012)  . Dysrhythmia    1 time issue with A. Fib. In the hospital for 1 week. Spontaneously convereted to NSR.  Marland Kitchen GERD (gastroesophageal reflux disease)   . Gout   . Hypertension   . Lower extremity edema   . Migraines   . Osteoarthritis   . Peripheral neuropathy   . PTSD (post-traumatic stress disorder)   . Restless legs syndrome   . S/P chemotherapy, time since less than 4 weeks   . Tremor, essential     Past Surgical History:  Procedure Laterality Date  . APPENDECTOMY    . CARDIOVASCULAR STRESS TEST     06/24/10 [facility]): No ischemia, EF 53%.  . COLONOSCOPY N/A 08/01/2017   Procedure: COLONOSCOPY;  Surgeon: Lin Landsman, MD;  Location: [facility] ENDOSCOPY;  Service: Gastroenterology;  Laterality: N/A;  . INCISIONAL HERNIA REPAIR  08/03/2018  . INCISIONAL HERNIA REPAIR N/A 08/03/2018   Procedure: LAPAROSCOPIC INCISIONAL HERNIA REPAIR WITH MESH;  Surgeon: Coralie Keens, MD;  Location: [place];  Service: General;  Laterality: N/A;  . PARTIAL COLECTOMY N/A 06/28/2012   Procedure: PARTIAL COLECTOMY;  Surgeon: Harl Bowie, MD;  Location: [place];  Service: General;  Laterality: N/A;  . PORTACATH PLACEMENT N/A 07/11/2012   Procedure: INSERTION PORT-A-CATH;  Surgeon: Harl Bowie, MD;  Location: [facility] ORS;  Service: General;  Laterality: N/A;  .  portacath removal    . TRANSTHORACIC ECHOCARDIOGRAM     06/22/10 [facility]): LVEF 55%. Normal LA size. Mild MR/TR. Trace PI.  . TUBAL LIGATION  ~ 1989    Social History   Tobacco Use  Smoking Status Current Every Day Smoker  . Packs/day: 0.20  . Years: 15.00  . Pack years: 3.00  . Types: Cigarettes  . Start date: 01/17/1991  Smokeless Tobacco Never Used    Social History   Substance and Sexual Activity   Alcohol Use No  . Alcohol/week: 0.0 standard drinks    Family History  Problem Relation Age of Onset  . Diabetes Mother   . Heart disease Mother   . Stomach cancer Mother   . Ovarian cancer Mother 80  . COPD Father   . Hypertension Father   . Hyperlipidemia Father   . Colon polyps Father        29 lifetime colon polyps  . Down syndrome Paternal Aunt   . Cancer Paternal Uncle        cancer in the spine  . COPD Maternal Grandmother   . Stomach cancer Paternal Grandmother   . Vascular Disease Brother   . Aneurysm Brother     Current Outpatient Medications on File Prior to Visit  Medication Sig Dispense Refill  . albuterol (VENTOLIN HFA) 108 (90 Base) MCG/ACT inhaler Inhale 1 puff into the lungs every 6 (six) hours as needed for wheezing or shortness of breath.    . ALPRAZolam (XANAX) 0.5 MG tablet Take 0.5 mg by mouth 3 (three) times daily as needed for anxiety.  2  . metoprolol tartrate (LOPRESSOR) 25 MG tablet Take 1 tablet (25 mg total) by mouth 2 (two) times daily. 60 tablet 0  . omeprazole (PRILOSEC) 20 MG capsule Take 20 mg by mouth daily.    . rizatriptan (MAXALT) 10 MG tablet Take 10 mg by mouth See admin instructions. Take 10 mg by mouth at onset of headache. Repeat in 2 hours if needed. Maximum 2 tablets in 24 hours.  3   No current facility-administered medications on file prior to visit.    Cardiovascular and other pertinent studies:  EKG 02/16/2020: Probable sinus rhythm 75 bpm Diffuse low voltage.  Anteroseptal infarct -age undetermined.   EKG 01/25/2020:  Atrial fibrillation with controlled ventricular response at a rate of 90 bpm  Recent labs: 01/29/2020: Glucose 109, BUN/Cr 19/1.07. EGFR >60. Na/K 140/4.2. Rest of the CMP normal H/H 13.4/41.4. MCV 89.0. Platelets 406 0.59TSH  Normal  05/13/2018: A1C 5.7%  Review of Systems  Constitutional: Positive for malaise/fatigue. Negative for weight gain.  Cardiovascular: Positive for chest pain and  palpitations. Negative for claudication, leg swelling, near-syncope, orthopnea, paroxysmal nocturnal dyspnea and syncope.  Respiratory: Positive for shortness of breath and snoring.   Hematologic/Lymphatic: Does not bruise/bleed easily.  Gastrointestinal: Negative for melena.  Neurological: Negative for dizziness and weakness.        Vitals:   02/16/20 1003  BP: (!) 151/95  Pulse: 74  Resp: 16  SpO2: 96%     Body mass index is 31.95 kg/m. Filed Weights   02/16/20 1003  Weight: 204 lb (92.5 kg)     Objective:   Physical Exam Vitals reviewed.  HENT:     Head: Normocephalic and atraumatic.  Cardiovascular:     Rate and Rhythm: Normal rate and regular rhythm.     Pulses: Intact distal pulses.          Carotid pulses are 2+ on  the right side and 2+ on the left side.      Radial pulses are 2+ on the right side and 2+ on the left side.       Popliteal pulses are 2+ on the right side and 2+ on the left side.       Dorsalis pedis pulses are 1+ on the right side and 1+ on the left side.       Posterior tibial pulses are 2+ on the right side and 2+ on the left side.     Heart sounds: S1 normal and S2 normal. No murmur heard. No gallop.      Comments: No leg edema. No JVD  Pulmonary:     Effort: Pulmonary effort is normal. No respiratory distress.     Breath sounds: Wheezing present. No rhonchi or rales.     Comments: Wheezes present throughout bilateral lung fields.  Musculoskeletal:     Right lower leg: No edema.     Left lower leg: No edema.  Neurological:     Mental Status: She is alert.         Assessment & Recommendations:   55 y.o. occasion female with history of hypertension, palpitations, anxiety, bipolar 1 disorder, tobacco use, polysubstance abuse, and colon cancer (2014). She has family history of early CAD.   1. Paroxysmal atrial fibrillation This patients CHA2DS2-VASc Score 2 (HTN, Female) and yearly risk of stroke 2.2%. Patient is not presently on  anticoagulation.  EKG today revealed sinus rhythm at rate of 75 bpm.  Patient has multiple risk factors for atrial fibrillation including obesity, hypertension, smoking, as well as symptoms suggestive of underlying sleep apnea. Patient also has underlying risk factors for coronary artery disease, will need further evaluation. Will obtain 2 week cardiac monitor to evaluate atrial fibrillation burden. However, patient prefers to defer monitoring until after a trip she is taking this coming week. Will obtain stress test, echocardiogram, and coronary calcium score.  In view of symptoms concerning for sleep apnea, will referral for evaluation.   - EKG 12-Lead - PCV MYOCARDIAL PERFUSION WO LEXISCAN; Future - PCV ECHOCARDIOGRAM COMPLETE; Future - LONG TERM MONITOR (3-14 DAYS); Future - Ambulatory referral to Sleep Studies - CT CARDIAC SCORING (SELF PAY ONLY); Future  2. Primary hypertension Blood pressure elevate in office today. Will increase lisinopril from 20 mg to 40 mg daily with repeat BMP in 1 week.   - lisinopril (ZESTRIL) 20 MG tablet; Take 2 tablets (40 mg total) by mouth daily.  Dispense: 60 tablet; Refill: 0 - Basic metabolic panel; Future  3. Palpitations See above recommendations. Will obtain 2 week cardiac monitor.   4. Dyspnea on exertion Suspect potential underlying lung disease, recommend follow up with PCP.  Will evaluate for cardiac etiology with monitor, echocardiogram, stress test, and coronary calcium scoring.   - PCV MYOCARDIAL PERFUSION WO LEXISCAN; Future - PCV ECHOCARDIOGRAM COMPLETE; Future - Novel Coronavirus, NAA ([facility]) - CT CARDIAC SCORING (SELF PAY ONLY); Future  5. Snoring - Ambulatory referral to Sleep Studies  6. Tobacco use Counseled regarding smoking cessation. Offered nicotine patches to assist her in quitting. She prefers not to use patches at this time.   - CT CARDIAC SCORING (SELF PAY ONLY); Future  I have independently reviewed records  and EKG from prior hospitalization.   Thank you for referring the patient to [place]. Please feel free to contact with any questions.  Patient was seen in collaboration with Dr. Virgina Jock. He also reviewed patient's chart and examined  the patient. Dr. Virgina Jock is in agreement of the plan.    Alethia Berthold, PA-C 02/16/2020, 2:51 PM Office: [phone]

## 2020-02-16 NOTE — Addendum Note (Signed)
Addended by: Nigel Mormon on: 02/16/2020 05:13 PM   Modules accepted: Level of Service

## 2020-02-16 NOTE — Patient Instructions (Signed)
Blood work at [facility] in 1 week

## 2020-02-26 ENCOUNTER — Inpatient Hospital Stay: Payer: Medicare Other

## 2020-02-26 ENCOUNTER — Other Ambulatory Visit: Payer: Self-pay

## 2020-02-26 DIAGNOSIS — I4891 Unspecified atrial fibrillation: Secondary | ICD-10-CM

## 2020-02-27 LAB — BASIC METABOLIC PANEL
BUN/Creatinine Ratio: 11 (ref 9–23)
BUN: 10 mg/dL (ref 6–24)
CO2: 27 mmol/L (ref 20–29)
Calcium: 9.2 mg/dL (ref 8.7–10.2)
Chloride: 101 mmol/L (ref 96–106)
Creatinine, Ser: 0.93 mg/dL (ref 0.57–1.00)
GFR calc Af Amer: 80 mL/min/{1.73_m2} (ref >59)
GFR calc non Af Amer: 69 mL/min/{1.73_m2} (ref >59)
Glucose: 98 mg/dL (ref 65–99)
Potassium: 4.5 mmol/L (ref 3.5–5.2)
Sodium: 142 mmol/L (ref 134–144)

## 2020-02-27 NOTE — Progress Notes (Signed)
No answer left a vm will try again later

## 2020-02-27 NOTE — Progress Notes (Signed)
Please inform patient her kidney function is stable and electrolytes are normal. Continue lisinopril.

## 2020-02-28 NOTE — Progress Notes (Signed)
2nd attempt : Called patient, Sheri Calderon, LMAM

## 2020-03-04 ENCOUNTER — Ambulatory Visit: Payer: Commercial Managed Care - PPO

## 2020-03-04 ENCOUNTER — Other Ambulatory Visit: Payer: Self-pay

## 2020-03-04 DIAGNOSIS — I480 Paroxysmal atrial fibrillation: Secondary | ICD-10-CM | POA: Diagnosis not present

## 2020-03-04 DIAGNOSIS — R0600 Dyspnea, unspecified: Secondary | ICD-10-CM

## 2020-03-04 DIAGNOSIS — R0609 Other forms of dyspnea: Secondary | ICD-10-CM | POA: Diagnosis not present

## 2020-03-05 NOTE — Progress Notes (Signed)
Called patient and informed her of abnormal stress test, she verbalized understanding. Patient will see Dr. Virgina Jock for an urgent visit tomorrow to discuss cancelling coronary CT and proceeding with cath.

## 2020-03-06 ENCOUNTER — Ambulatory Visit: Payer: Commercial Managed Care - PPO

## 2020-03-06 ENCOUNTER — Encounter: Payer: Self-pay | Admitting: Cardiology

## 2020-03-06 ENCOUNTER — Ambulatory Visit: Payer: Commercial Managed Care - PPO | Admitting: Cardiology

## 2020-03-06 ENCOUNTER — Other Ambulatory Visit: Payer: Self-pay

## 2020-03-06 VITALS — BP 150/92 | HR 84 | Resp 16 | Ht 67.0 in | Wt 209.0 lb

## 2020-03-06 DIAGNOSIS — R0600 Dyspnea, unspecified: Secondary | ICD-10-CM

## 2020-03-06 DIAGNOSIS — I480 Paroxysmal atrial fibrillation: Secondary | ICD-10-CM

## 2020-03-06 DIAGNOSIS — R0609 Other forms of dyspnea: Secondary | ICD-10-CM | POA: Diagnosis not present

## 2020-03-06 DIAGNOSIS — I10 Essential (primary) hypertension: Secondary | ICD-10-CM

## 2020-03-06 DIAGNOSIS — R9439 Abnormal result of other cardiovascular function study: Secondary | ICD-10-CM | POA: Diagnosis not present

## 2020-03-06 MED ORDER — ASPIRIN 81 MG PO CAPS: 81.0000 mg | ORAL_CAPSULE | Freq: Every day | ORAL | 3 refills | Status: DC

## 2020-03-06 MED ORDER — LISINOPRIL 40 MG PO TABS: 40.0000 mg | ORAL_TABLET | Freq: Every day | ORAL | 3 refills | Status: DC

## 2020-03-06 MED ORDER — NITROGLYCERIN 0.4 MG SL SUBL: 0.4000 mg | SUBLINGUAL_TABLET | SUBLINGUAL | 3 refills | Status: AC | PRN

## 2020-03-06 MED ORDER — METOPROLOL TARTRATE 25 MG PO TABS: 25.0000 mg | ORAL_TABLET | Freq: Two times a day (BID) | ORAL | 0 refills | Status: DC

## 2020-03-06 MED ORDER — ROSUVASTATIN CALCIUM 20 MG PO TABS: 20.0000 mg | ORAL_TABLET | Freq: Every day | ORAL | 3 refills | Status: AC

## 2020-03-06 NOTE — Progress Notes (Signed)
Follow up visit  Subjective:   Sheri Calderon, female    DOB: [birth year], 55 y.o.   MRN: [id number]     HPI  Chief Complaint  Patient presents with  . Paroxysmal atrial fibrillation   . Follow-up  . Results    55 y.o. Caucasian female with hypertension, tobacco dependence, COPD, suspected OSA, paroxysmal atrial fibrillation.    Given risk factors for CAD and symptoms of chest pressure, she underwent echocardiogram and exercise nuclear stress test, results below.   She now presents for follow up. She reports continued dyspnea on exertion as well as intermittent chest tightness since last visit. She is presently tolerating lisinopril and metoprolol tartrate without issue.    Current Outpatient Medications on File Prior to Visit  Medication Sig Dispense Refill  . albuterol (VENTOLIN HFA) 108 (90 Base) MCG/ACT inhaler Inhale 1 puff into the lungs every 6 (six) hours as needed for wheezing or shortness of breath.    . ALPRAZolam (XANAX) 0.5 MG tablet Take 0.5 mg by mouth 3 (three) times daily as needed for anxiety.  2  . omeprazole (PRILOSEC) 20 MG capsule Take 20 mg by mouth daily.    . ondansetron (ZOFRAN) 8 MG tablet Take by mouth.    . rizatriptan (MAXALT) 10 MG tablet Take 10 mg by mouth See admin instructions. Take 10 mg by mouth at onset of headache. Repeat in 2 hours if needed. Maximum 2 tablets in 24 hours.  3   No current facility-administered medications on file prior to visit.    Cardiovascular & other pertient studies:  PCV ECHOCARDIOGRAM COMPLETE 03/06/2020 Left ventricle cavity is normal in size and wall thickness. Normal global wall motion. Normal LV systolic function with EF 61%. Normal diastolic filling pattern. No significant valvular abnormalities. No evidence of pulmonary hypertension.   Exercise Myoview stress test 03/04/2020: Exercise nuclear stress test was performed using Bruce protocol. Patient reached 7 METS, and 87% of age predicted maximum heart rate.  Exercise capacity was fair. Non-limiting chest pain reported. Heart rate and hemodynamic response were normal.  Peak stress EKG showed 1.5-2 mm horizontal ST depressions in leads V5,V6, 1.5 mm upsloping ST depressions in leads V4, I, II, <1 mm ST elevations in leads V1-V4; persist beyond 2 min into recovery.  1 Day Rest/Stress Protocol. TID index is 1.21. All segments of left ventricle demonstrated normal wall motion and thickening. Stress LV EF is normal 65%.  SPECT images showed small sized, mild intensity perfusion defect in apical to basal inferior/inferoseptal myocardium, worse on rest images. While tissue attenuation is possible, EKG changes and TID 1.2 suggest possible global ischemia, High risk study.   EKG 02/16/2020: Probable sinus rhythm 75 bpm Diffuse low voltage.  Anteroseptal infarct -age undetermined.   EKG 01/25/2020:  Atrial fibrillation with controlled ventricular response at a rate of 90 bpm   Recent labs:  02/26/2020: Glucose 98, BUN/Cr 10/0.93. EGFR 69. Na/K 142/4.5. Rest of the CMP normal  01/29/2020: Glucose 109, BUN/Cr 19/1.07. EGFR >60. Na/K 140/4.2. Rest of the CMP normal H/H 13.4/41.4. MCV 89.0. Platelets 406 0.59TSH  Normal  05/13/2018:  A1C 5.7%   Review of Systems  Constitutional: Positive for malaise/fatigue. Negative for weight gain.  Cardiovascular: Positive for chest pain and dyspnea on exertion. Negative for claudication, leg swelling, near-syncope, orthopnea, palpitations, paroxysmal nocturnal dyspnea and syncope.  Hematologic/Lymphatic: Does not bruise/bleed easily.  Gastrointestinal: Negative for melena.  Neurological: Negative for dizziness and weakness.         Vitals:  03/06/20 1505 03/06/20 1508  BP: (!) 145/95 (!) 150/92  Pulse: 81 84  Resp: 16   SpO2:  96%    Body mass index is 32.73 kg/m. Filed Weights   03/06/20 1505  Weight: 209 lb (94.8 kg)     Objective:   Physical Exam Vitals reviewed.  HENT:     Head:  Normocephalic and atraumatic.  Neck:     Vascular: No carotid bruit or JVD.  Cardiovascular:     Rate and Rhythm: Normal rate and regular rhythm.     Pulses: Intact distal pulses.     Heart sounds: S1 normal and S2 normal. No murmur heard. No gallop.   Pulmonary:     Effort: Pulmonary effort is normal. No respiratory distress.     Breath sounds: No wheezing, rhonchi or rales.  Musculoskeletal:     Right lower leg: No edema.     Left lower leg: No edema.  Neurological:     Mental Status: She is alert.        Assessment & Recommendations:   55 y.o. Caucasian female with history of hypertension, palpitations, anxiety, bipolar 1 disorder, tobacco use, polysubstance abuse, and colon cancer (2014). She has family history of early CAD.    1. Abnormal stress test Stress test revealed EKG changes and perfusion deficit suggestive of multivessel coronary artery disease. Recommend heart catheterization.  Will cancel previously scheduled coronary calcium score. Heart catheterization procedure was explained to the patient in detail. The indication, alternatives, risks and benefits were reviewed. Complications including but not limited to bleeding, infection, acute kidney injury, blood transfusion, heart rhythm disturbances, contrast (dye) reaction, damage to the arteries or nerves in the legs or hands, cerebrovascular accident, myocardial infarction, need for emergent bypass surgery, blood clots in the legs, possible need for emergent blood transfusion, and rarely death were reviewed and discussed with the patient. The patient voices understanding and wishes to proceed. In view of suspected underlying coronary artery disease, will start aspirin 81 mg daily and Crestor 20 mg daily. S/L NTG was prescribed and explained how to and when to use it and to notify us if there is change in frequency of use. Continue metoprolol tartrate 25 mg twice daily, lisinopril 40 mg daily.   - Aspirin 81 MG CAPS; Take  81 mg by mouth daily.  Dispense: 90 capsule; Refill: 3 - rosuvastatin (CRESTOR) 20 MG tablet; Take 1 tablet (20 mg total) by mouth daily.  Dispense: 90 tablet; Refill: 3  2. Paroxysmal atrial fibrillation (HCC) Patient has multiple risk factors for atrial fibrillation including obesity, hypertension, smoking, as well as symptoms suggestive of underlying sleep apnea (evlauation pending).  Patient underwent cardiac monitoring, results pending. Patient is not presently on oral anticoagulation as she has CHA2DS2-VASc score of at least 2 and she has remained in sinus rhythm.  In view of abnormal stress test, underlying ischemia may be etiology of atrial fibrillation.   If patient indeed has underlying coronary artery disease her CHA2DS2-VASc score would be 3 and would recommend oral anticoagulation at that point.   Will reevaluate need for oral anticoagulation following cardiac catheterization.  - metoprolol tartrate (LOPRESSOR) 25 MG tablet; Take 1 tablet (25 mg total) by mouth 2 (two) times daily.  Dispense: 60 tablet; Refill: 0  3. Primary hypertension Blood pressure remains uncontrolled, however patient has not been taking metoprolol titrate as directed. Continue lisinopril 40 mg daily. Advised patient to increase metoprolol titrate from 25 mg once daily to 25 mg twice daily.  -  lisinopril (ZESTRIL) 40 MG tablet; Take 1 tablet (40 mg total) by mouth daily.  Dispense: 90 tablet; Refill: 3 - metoprolol tartrate (LOPRESSOR) 25 MG tablet; Take 1 tablet (25 mg total) by mouth 2 (two) times daily.  Dispense: 60 tablet; Refill: 0 - Aspirin 81 MG CAPS; Take 81 mg by mouth daily.  Dispense: 90 capsule; Refill: 3  4. Dyspnea on exertion Cardiac monitor results pending. See recommendations regarding abnormal stress test above. Echocardiogram revealed normal LV systolic function with EF of 61%, no regional wall motion abnormalities, and no significant valvular abnormality.  Follow up in 1 month.    Patient was seen in collaboration with Dr. Virgina Jock. He also reviewed patient's chart and examined the patient. Dr. Virgina Jock is in agreement of the plan.    Alethia Berthold, PA-C 03/06/2020, 4:26 PM Office: [phone]

## 2020-03-08 ENCOUNTER — Other Ambulatory Visit (HOSPITAL_COMMUNITY)
Admission: RE | Admit: 2020-03-08 | Discharge: 2020-03-08 | Disposition: A | Discharge status: Home / Self Care | Payer: Commercial Managed Care - PPO | Source: Ambulatory Visit | Attending: Cardiology | Admitting: Cardiology

## 2020-03-08 DIAGNOSIS — Z01812 Encounter for preprocedural laboratory examination: Secondary | ICD-10-CM | POA: Insufficient documentation

## 2020-03-08 DIAGNOSIS — Z20822 Contact with and (suspected) exposure to covid-19: Secondary | ICD-10-CM | POA: Diagnosis not present

## 2020-03-08 LAB — SARS CORONAVIRUS 2 (TAT 6-24 HRS): SARS Coronavirus 2: POSITIVE — AB

## 2020-03-09 IMAGING — CT CT ANGIOGRAPHY CHEST
2 series · 12 of 16 positions shown · IV contrast (APPLIED)
Comparison: 07/21/2012.

CLINICAL DATA: Acute shortness of breath with chills and fatigue.

EXAM:
CT ANGIOGRAPHY CHEST WITH CONTRAST
TECHNIQUE: Multidetector CT imaging of the chest was performed using the
standard protocol during bolus administration of intravenous
contrast. Multiplanar CT image reconstructions and MIPs were
obtained to evaluate the vascular anatomy.
CONTRAST:  75mL [id number] IOPAMIDOL (VU90YF-KS3) INJECTION 76%

[Series 4: pe 3.0 b31s · axial · 0.69mm/px · z∈[-172,-76]mm · 2 of 98 slices shown]
[im 33/98  lung]
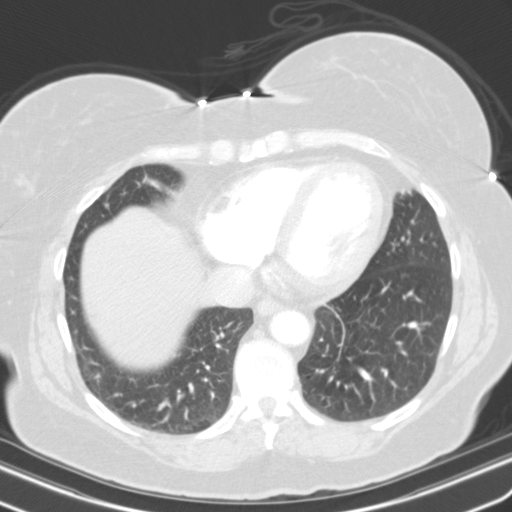
[im 65/98  lung]
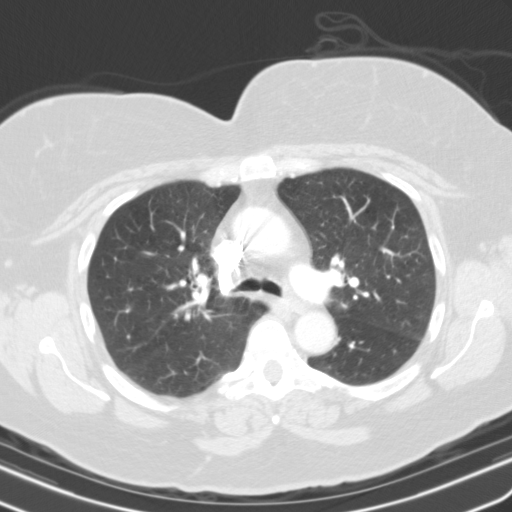

[Series 6: thins 1.0 b31s · axial · 0.69mm/px · z∈[-244,-18]mm · 10 of 278 slices shown]
[im 26/278  lung]
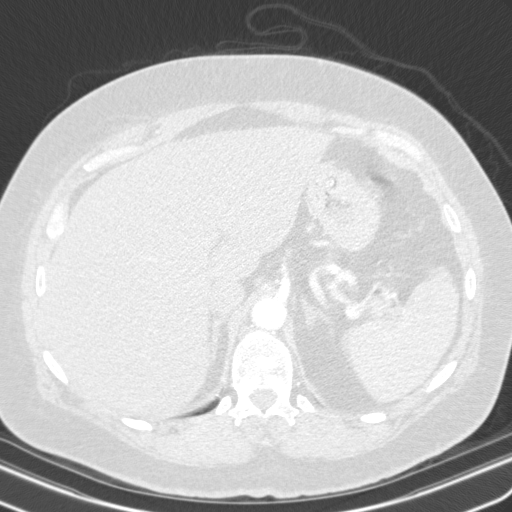
[im 51/278  soft-tissue]
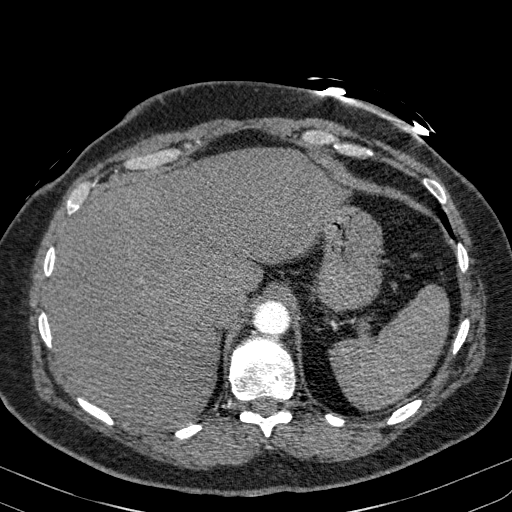
[im 76/278  lung]
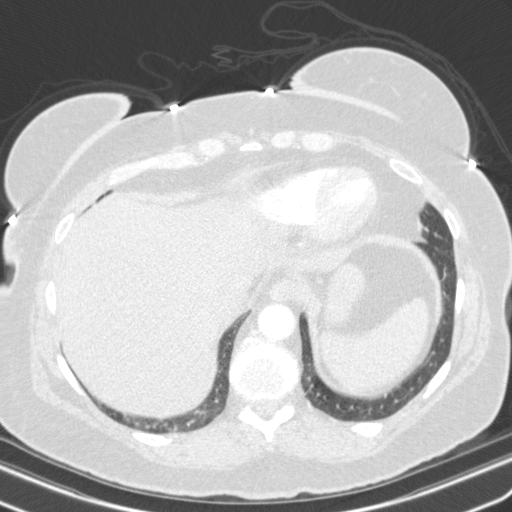
[im 101/278  soft-tissue]
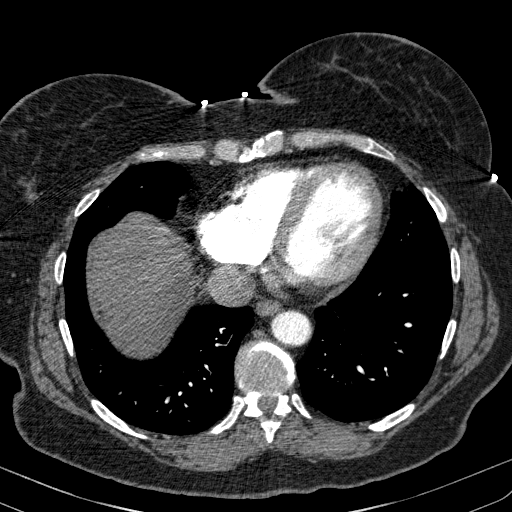
[im 126/278  lung]
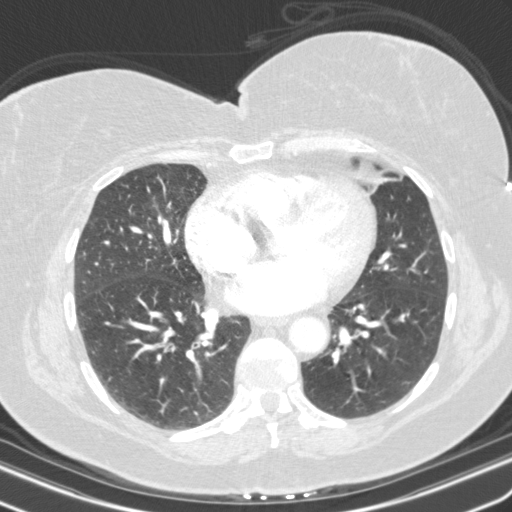
[im 152/278  soft-tissue]
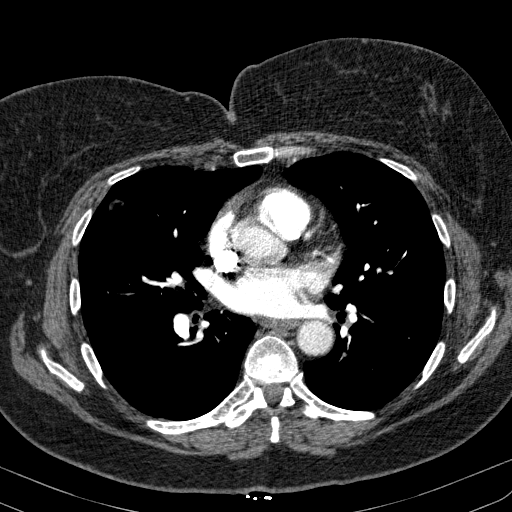
[im 177/278  lung]
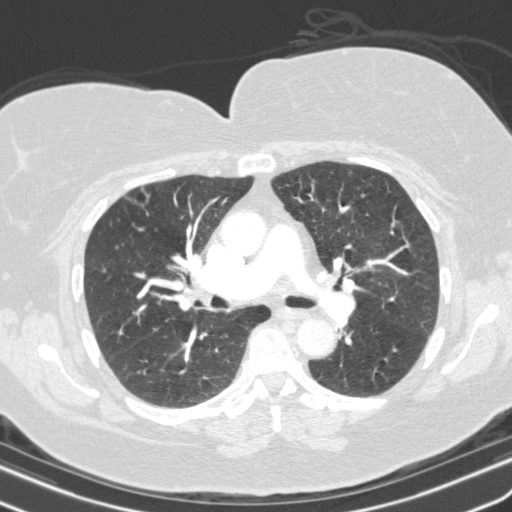
[im 202/278  soft-tissue]
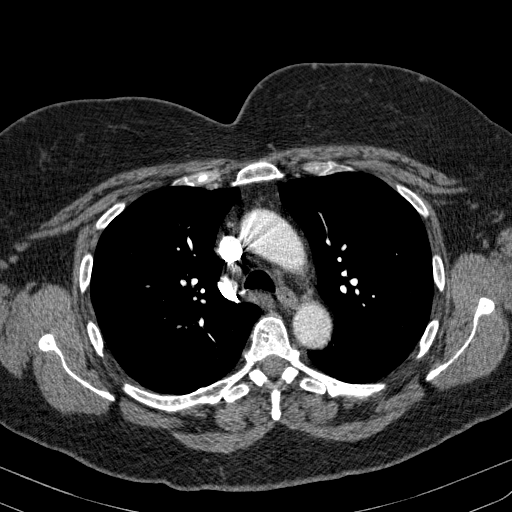
[im 227/278  lung]
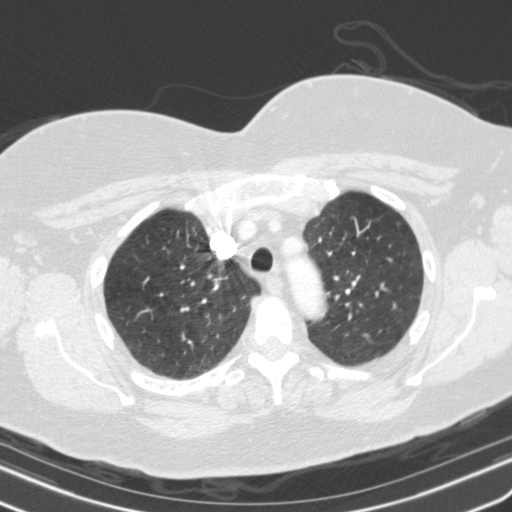
[im 252/278  soft-tissue]
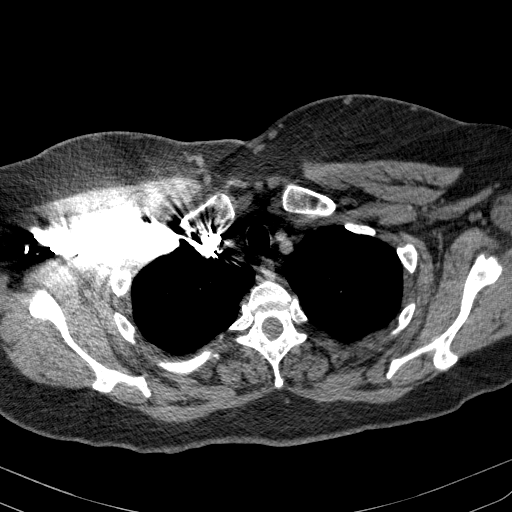

[12 of 16 positions shown; findings below may reference images not displayed]

FINDINGS: Cardiovascular: Normal heart size. No pericardial effusion. The main
pulmonary artery is patent. No lobar or segmental pulmonary artery
filling defects identified.

Mediastinum/Nodes: Normal appearance of the thyroid gland. The
trachea appears patent and is midline. Normal appearance of the
esophagus. No enlarged mediastinal or hilar lymph nodes.

Lungs/Pleura: Moderate to advanced changes of emphysema. No airspace
consolidation identified. Bilateral upper lobe scarring.

Upper Abdomen: No acute abnormality.

Musculoskeletal: No chest wall abnormality. No acute or significant
osseous findings.

Review of the MIP images confirms the above findings.
IMPRESSION: 1. No acute cardiopulmonary abnormalities. No evidence for acute
pulmonary embolus.
2. Bilateral upper lobe scarring.
3.  Aortic Atherosclerosis ([id number]-[id number].E).

## 2020-03-09 NOTE — Progress Notes (Signed)
Notified Dr Shanon Brow that patient had Covid test on 03/08/20 that was +.  Patient will need to be rescheduled for 10 days if no symptoms, 21 days if symptomatic.  Dr Shanon Brow will let the patient know cath is cancelled and that she is Covid +

## 2020-03-11 ENCOUNTER — Telehealth: Payer: Self-pay | Admitting: Physician Assistant

## 2020-03-11 ENCOUNTER — Telehealth (HOSPITAL_COMMUNITY): Payer: Self-pay

## 2020-03-11 ENCOUNTER — Other Ambulatory Visit: Payer: Self-pay | Admitting: Physician Assistant

## 2020-03-11 MED ORDER — NIRMATRELVIR/RITONAVIR (PAXLOVID)TABLET: 3.0000 | ORAL_TABLET | Freq: Two times a day (BID) | ORAL | 0 refills | Status: AC

## 2020-03-11 MED FILL — PAXLOVID 20 X 150 MG & 10 X: 20 X 150 MG | 5 days supply | Qty: 30 | Fill #0

## 2020-03-11 NOTE — Progress Notes (Signed)
Notified the patient of COVID positive result on 03/09/2020. Patient was feeling tired and short of breath, but did not have fever. Asked her to quarantine herself, and monitor temperature and O2 sat.If O2 sat drops below 90%, recommended her to to go ER. Will also check re: ambulatory referral for COVID treatment.  Nigel Mormon, MD Pager: [phone] Office: [phone]

## 2020-03-11 NOTE — Telephone Encounter (Signed)
Patient was prescribed oral covid treatment Paxlovid and treatment note was reviewed. Medication has been received by [place] and reviewed for appropriateness.  Drug Interactions or Dosage Adjustments Noted: Hold Rosuvastatin and only use Alprazolam half dose if needed.   Delivery Method: pick up  Patient contacted for counseling on 03/11/20 and verbalized understanding.   Delivery or Pick-Up Date: 03/11/20   Alinda Dooms 03/11/2020, 10:38 AM [facility] Outpatient Pharmacist Phone# [phone]

## 2020-03-11 NOTE — Telephone Encounter (Signed)
Thank you so much for following up.

## 2020-03-11 NOTE — Telephone Encounter (Signed)
Outpatient Oral COVID Treatment Note  I connected with Arrie Eastern on 03/11/2020/10:04 AM by telephone and verified that I am speaking with the correct person using two identifiers.  I discussed the limitations, risks, security, and privacy concerns of performing an evaluation and management service by telephone and the availability of in person appointments. I also discussed with the patient that there may be a patient responsible charge related to this service. The patient expressed understanding and agreed to proceed.  Patient location: home  Provider location: office   Diagnosis: COVID-19 infection  Purpose of visit: Discussion of potential use of Molnupiravir or Paxlovid, a new treatment for mild to moderate COVID-19 viral infection in non-hospitalized patients.   Subjective: Patient is a 55 y.o. female who has been diagnosed with COVID 19 viral infection.  Their symptoms began on 1/6 with fatigue and cough.    Past Medical History:  Diagnosis Date  . Allergic rhinitis   . Anxiety   . Atrial fibrillation ([place])    ~ 06/2010 single episdose, evaluted at [facility] with testing. No reccurence. Does not see cardiologist (08/01/2018)  . Colon cancer [facility]) 2014   cancer of the sigmoid colon  . Depression   . Diverticulitis    "this is my 2nd time in hospital w/this in the last 2 wk" (06/21/2012)  . Dysrhythmia    1 time issue with A. Fib. In the hospital for 1 week. Spontaneously convereted to NSR.  Marland Kitchen GERD (gastroesophageal reflux disease)   . Gout   . Hypertension   . Lower extremity edema   . Migraines   . Osteoarthritis   . Peripheral neuropathy   . PTSD (post-traumatic stress disorder)   . Restless legs syndrome   . S/P chemotherapy, time since less than 4 weeks   . Tremor, essential     Allergies  Allergen Reactions  . Lorazepam Hives and Swelling    Patient reports receiving lorazepam intensol in the hospital and experienced swelling with hives.  . Gabapentin Rash      Current Outpatient Medications:  .  nirmatrelvir/ritonavir EUA (PAXLOVID) TABS, Take 3 tablets by mouth 2 (two) times daily for 5 days. Take nirmatrelvir (150 mg) two tablet(s) twice daily for 5 days and ritonavir (100 mg) one tablet twice daily for 5 days., Disp: 30 tablet, Rfl: 0 .  albuterol (VENTOLIN HFA) 108 (90 Base) MCG/ACT inhaler, Inhale 1 puff into the lungs every 6 (six) hours as needed for wheezing or shortness of breath., Disp: , Rfl:  .  ALPRAZolam (XANAX) 0.5 MG tablet, Take 0.5 mg by mouth 3 (three) times daily as needed for anxiety., Disp: , Rfl: 2 .  Aspirin 81 MG CAPS, Take 81 mg by mouth daily., Disp: 90 capsule, Rfl: 3 .  lisinopril (ZESTRIL) 40 MG tablet, Take 1 tablet (40 mg total) by mouth daily., Disp: 90 tablet, Rfl: 3 .  metoprolol tartrate (LOPRESSOR) 25 MG tablet, Take 1 tablet (25 mg total) by mouth 2 (two) times daily., Disp: 60 tablet, Rfl: 0 .  nitroGLYCERIN (NITROSTAT) 0.4 MG SL tablet, Place 1 tablet (0.4 mg total) under the tongue every 5 (five) minutes as needed for chest pain., Disp: 90 tablet, Rfl: 3 .  omeprazole (PRILOSEC) 20 MG capsule, Take 20 mg by mouth daily., Disp: , Rfl:  .  ondansetron (ZOFRAN) 8 MG tablet, Take 8 mg by mouth every 8 (eight) hours as needed for vomiting or nausea., Disp: , Rfl:  .  pregabalin (LYRICA) 150 MG capsule,  Take 150 mg by mouth 2 (two) times daily., Disp: , Rfl:  .  prochlorperazine (COMPAZINE) 10 MG tablet, Take 10 mg by mouth every 8 (eight) hours as needed for nausea or vomiting., Disp: , Rfl:  .  rizatriptan (MAXALT) 10 MG tablet, Take 10 mg by mouth See admin instructions. Take 10 mg by mouth at onset of headache. Repeat in 2 hours if needed. Maximum 2 tablets in 24 hours., Disp: , Rfl: 3 .  rosuvastatin (CRESTOR) 20 MG tablet, Take 1 tablet (20 mg total) by mouth daily., Disp: 90 tablet, Rfl: 3  Objective: Patient sounds okay over phone with some mild wheezing and intermittent cough.   Laboratory Data:  Recent  Results (from the past 2160 hour(s))  Rapid urine drug screen (hospital performed)     Status: Abnormal   Collection Time: 01/23/20 12:58 PM  Result Value Ref Range   Opiates NONE DETECTED NONE DETECTED   Cocaine POSITIVE (A) NONE DETECTED   Benzodiazepines POSITIVE (A) NONE DETECTED   Amphetamines POSITIVE (A) NONE DETECTED   Tetrahydrocannabinol POSITIVE (A) NONE DETECTED   Barbiturates NONE DETECTED NONE DETECTED    Comment: (NOTE) DRUG SCREEN FOR MEDICAL PURPOSES ONLY.  IF CONFIRMATION IS NEEDED FOR ANY PURPOSE, NOTIFY LAB WITHIN 5 DAYS.  LOWEST DETECTABLE LIMITS FOR URINE DRUG SCREEN Drug Class                     Cutoff (ng/mL) Amphetamine and metabolites    1000 Barbiturate and metabolites    200 Benzodiazepine                 903 Tricyclics and metabolites     300 Opiates and metabolites        300 Cocaine and metabolites        300 THC                            50 Performed at [facility], [place] [address] [place], [place] [address]   Pregnancy, urine     Status: None   Collection Time: 01/23/20 12:58 PM  Result Value Ref Range   Preg Test, Ur NEGATIVE NEGATIVE    Comment:        THE SENSITIVITY OF THIS METHODOLOGY IS >20 mIU/mL. Performed at [facility], [place] [address], [place], [place] [address]   Ethanol     Status: None   Collection Time: 01/23/20  1:15 PM  Result Value Ref Range   Alcohol, Ethyl (B) <10 <10 mg/dL    Comment: (NOTE) Lowest detectable limit for serum alcohol is 10 mg/dL.  For medical purposes only. Performed at [facility], [place] [address] [place], [place] [address]   Salicylate level     Status: Abnormal   Collection Time: 01/23/20  1:15 PM  Result Value Ref Range   Salicylate Lvl <3.5 (L) 7.0 - 30.0 mg/dL    Comment: Performed at [facility], [place] [address], [place], [place] [address]  Acetaminophen level     Status: Abnormal   Collection Time: 01/23/20   1:15 PM  Result Value Ref Range   Acetaminophen (Tylenol), Serum <10 (L) 10 - 30 ug/mL    Comment: (NOTE) Therapeutic concentrations vary significantly. A range of 10-30 ug/mL  may be an effective concentration for many patients. However, some  are best treated at concentrations outside of this range. Acetaminophen concentrations >150 ug/mL  at 4 hours after ingestion  and >50 ug/mL at 12 hours after ingestion are often associated with  toxic reactions.  Performed at [facility], [place] [address], [place], [place] [address]   CBC with Differential/Platelet     Status: Abnormal   Collection Time: 01/23/20  1:15 PM  Result Value Ref Range   WBC 7.7 4.0 - 10.5 K/uL   RBC 4.46 3.87 - 5.11 MIL/uL   Hemoglobin 12.9 12.0 - 15.0 g/dL   HCT 39.0 36.0 - 46.0 %   MCV 87.4 80.0 - 100.0 fL   MCH 28.9 26.0 - 34.0 pg   MCHC 33.1 30.0 - 36.0 g/dL   RDW 12.7 11.5 - 15.5 %   Platelets 416 (H) 150 - 400 K/uL   nRBC 0.0 0.0 - 0.2 %   Neutrophils Relative % 64 %   Neutro Abs 4.9 1.7 - 7.7 K/uL   Lymphocytes Relative 26 %   Lymphs Abs 2.0 0.7 - 4.0 K/uL   Monocytes Relative 8 %   Monocytes Absolute 0.6 0.1 - 1.0 K/uL   Eosinophils Relative 1 %   Eosinophils Absolute 0.1 0.0 - 0.5 K/uL   Basophils Relative 1 %   Basophils Absolute 0.1 0.0 - 0.1 K/uL   Immature Granulocytes 0 %   Abs Immature Granulocytes 0.01 0.00 - 0.07 K/uL    Comment: Performed at [facility], [place] [address] [place], [place] [address]  Comprehensive metabolic panel     Status: Abnormal   Collection Time: 01/23/20  1:15 PM  Result Value Ref Range   Sodium 141 135 - 145 mmol/L   Potassium 3.0 (L) 3.5 - 5.1 mmol/L   Chloride 105 98 - 111 mmol/L   CO2 25 22 - 32 mmol/L   Glucose, Bld 95 70 - 99 mg/dL    Comment: Glucose reference range applies only to samples taken after fasting for at least 8 hours.   BUN 18 6 - 20 mg/dL   Creatinine, Ser 0.89 0.44 - 1.00 mg/dL   Calcium 9.3 8.9 -  10.3 mg/dL   Total Protein 7.6 6.5 - 8.1 g/dL   Albumin 4.6 3.5 - 5.0 g/dL   AST 21 15 - 41 U/L   ALT 25 0 - 44 U/L   Alkaline Phosphatase 52 38 - 126 U/L   Total Bilirubin 0.8 0.3 - 1.2 mg/dL   GFR, Estimated >60 >60 mL/min    Comment: (NOTE) Calculated using the CKD-EPI Creatinine Equation (2021)    Anion gap 11 5 - 15    Comment: Performed at [facility], [place] [address], [place],  [address]  Resp Panel by RT-PCR (Flu A&B, Covid) Nasopharyngeal Swab     Status: None   Collection Time: 01/23/20  3:18 PM   Specimen: Nasopharyngeal Swab; Nasopharyngeal(NP) swabs in vial transport medium  Result Value Ref Range   SARS Coronavirus 2 by RT PCR NEGATIVE NEGATIVE    Comment: (NOTE) SARS-CoV-2 target nucleic acids are NOT DETECTED.  The SARS-CoV-2 RNA is generally detectable in upper respiratory specimens during the acute phase of infection. The lowest concentration of SARS-CoV-2 viral copies this assay can detect is 138 copies/mL. A negative result does not preclude SARS-Cov-2 infection and should not be used as the sole basis for treatment or other patient management decisions. A negative result may occur with  improper specimen collection/handling, submission of specimen other than nasopharyngeal swab, presence of viral mutation(s) within the areas targeted by this assay,  and inadequate number of viral copies(<138 copies/mL). A negative result must be combined with clinical observations, patient history, and epidemiological information. The expected result is Negative.  Fact Sheet for Patients:  EntrepreneurPulse.com.au  Fact Sheet for Healthcare Providers:  IncredibleEmployment.be  This test is no t yet approved or cleared by the [place] FDA and  has been authorized for detection and/or diagnosis of SARS-CoV-2 by FDA under an Emergency Use Authorization (EUA). This EUA will remain  in effect (meaning this test  can be used) for the duration of the COVID-19 declaration under Section 564(b)(1) of the Act, 21 U.S.C.section 360bbb-3(b)(1), unless the authorization is terminated  or revoked sooner.       Influenza A by PCR NEGATIVE NEGATIVE   Influenza B by PCR NEGATIVE NEGATIVE    Comment: (NOTE) The Xpert Xpress SARS-CoV-2/FLU/RSV plus assay is intended as an aid in the diagnosis of influenza from Nasopharyngeal swab specimens and should not be used as a sole basis for treatment. Nasal washings and aspirates are unacceptable for Xpert Xpress SARS-CoV-2/FLU/RSV testing.  Fact Sheet for Patients: EntrepreneurPulse.com.au  Fact Sheet for Healthcare Providers: IncredibleEmployment.be  This test is not yet approved or cleared by the [place] FDA and has been authorized for detection and/or diagnosis of SARS-CoV-2 by FDA under an Emergency Use Authorization (EUA). This EUA will remain in effect (meaning this test can be used) for the duration of the COVID-19 declaration under Section 564(b)(1) of the Act, 21 U.S.C. section 360bbb-3(b)(1), unless the authorization is terminated or revoked.  Performed at [facility], [place] [address] [place], [place] [address]   Comprehensive metabolic panel     Status: Abnormal   Collection Time: 01/25/20  5:04 AM  Result Value Ref Range   Sodium 145 135 - 145 mmol/L   Potassium 3.4 (L) 3.5 - 5.1 mmol/L   Chloride 109 98 - 111 mmol/L   CO2 26 22 - 32 mmol/L   Glucose, Bld 106 (H) 70 - 99 mg/dL    Comment: Glucose reference range applies only to samples taken after fasting for at least 8 hours.   BUN 20 6 - 20 mg/dL   Creatinine, Ser 0.80 0.44 - 1.00 mg/dL   Calcium 9.3 8.9 - 10.3 mg/dL   Total Protein 6.1 (L) 6.5 - 8.1 g/dL   Albumin 3.2 (L) 3.5 - 5.0 g/dL   AST 17 15 - 41 U/L   ALT 21 0 - 44 U/L   Alkaline Phosphatase 42 38 - 126 U/L   Total Bilirubin 0.4 0.3 - 1.2 mg/dL   GFR, Estimated  >60 >60 mL/min    Comment: (NOTE) Calculated using the CKD-EPI Creatinine Equation (2021)    Anion gap 10 5 - 15    Comment: Performed at [facility], [place] [address], [place], [place] [address]  CBC with Differential     Status: None   Collection Time: 01/25/20  5:04 AM  Result Value Ref Range   WBC 4.7 4.0 - 10.5 K/uL   RBC 4.43 3.87 - 5.11 MIL/uL   Hemoglobin 12.7 12.0 - 15.0 g/dL   HCT 40.0 36.0 - 46.0 %   MCV 90.3 80.0 - 100.0 fL   MCH 28.7 26.0 - 34.0 pg   MCHC 31.8 30.0 - 36.0 g/dL   RDW 12.5 11.5 - 15.5 %   Platelets 339 150 - 400 K/uL   nRBC 0.0 0.0 - 0.2 %   Neutrophils Relative % 47 %   Neutro Abs  2.2 1.7 - 7.7 K/uL   Lymphocytes Relative 37 %   Lymphs Abs 1.8 0.7 - 4.0 K/uL   Monocytes Relative 9 %   Monocytes Absolute 0.4 0.1 - 1.0 K/uL   Eosinophils Relative 6 %   Eosinophils Absolute 0.3 0.0 - 0.5 K/uL   Basophils Relative 1 %   Basophils Absolute 0.0 0.0 - 0.1 K/uL   Immature Granulocytes 0 %   Abs Immature Granulocytes 0.00 0.00 - 0.07 K/uL    Comment: Performed at [facility], [place] [address] [place], [place] [address]  Troponin I (High Sensitivity)     Status: None   Collection Time: 01/25/20  5:04 AM  Result Value Ref Range   Troponin I (High Sensitivity) 3 <18 ng/L    Comment: (NOTE) Elevated high sensitivity troponin I (hsTnI) values and significant  changes across serial measurements may suggest ACS but many other  chronic and acute conditions are known to elevate hsTnI results.  Refer to the "Links" section for chest pain algorithms and additional  guidance. Performed at [facility], [place] [address] [place], [place] [address]   TSH     Status: None   Collection Time: 01/25/20  5:04 AM  Result Value Ref Range   TSH 0.593 0.350 - 4.500 uIU/mL    Comment: Performed by a 3rd Generation assay with a functional sensitivity of <=0.01 uIU/mL. Performed at [facility], [place] [address], [place], [place] [address]   Troponin I  (High Sensitivity)     Status: None   Collection Time: 01/25/20 10:43 AM  Result Value Ref Range   Troponin I (High Sensitivity) 3 <18 ng/L    Comment: (NOTE) Elevated high sensitivity troponin I (hsTnI) values and significant  changes across serial measurements may suggest ACS but many other  chronic and acute conditions are known to elevate hsTnI results.  Refer to the "Links" section for chest pain algorithms and additional  guidance. Performed at [facility], [place] [address], [place],  [address]   Basic metabolic panel     Status: Abnormal   Collection Time: 01/29/20  9:19 AM  Result Value Ref Range   Sodium 140 135 - 145 mmol/L   Potassium 4.2 3.5 - 5.1 mmol/L   Chloride 106 98 - 111 mmol/L   CO2 21 (L) 22 - 32 mmol/L   Glucose, Bld 109 (H) 70 - 99 mg/dL    Comment: Glucose reference range applies only to samples taken after fasting for at least 8 hours.   BUN 19 6 - 20 mg/dL   Creatinine, Ser 1.07 (H) 0.44 - 1.00 mg/dL   Calcium 9.3 8.9 - 10.3 mg/dL   GFR, Estimated >60 >60 mL/min    Comment: (NOTE) Calculated using the CKD-EPI Creatinine Equation (2021)    Anion gap 13 5 - 15    Comment: Performed at [place] [address], [place], [place] [address]  CBC     Status: Abnormal   Collection Time: 01/29/20  9:19 AM  Result Value Ref Range   WBC 6.3 4.0 - 10.5 K/uL   RBC 4.65 3.87 - 5.11 MIL/uL   Hemoglobin 13.4 12.0 - 15.0 g/dL   HCT 41.4 36.0 - 46.0 %   MCV 89.0 80.0 - 100.0 fL   MCH 28.8 26.0 - 34.0 pg   MCHC 32.4 30.0 - 36.0 g/dL   RDW 12.6 11.5 - 15.5 %   Platelets 406 (H) 150 -  400 K/uL   nRBC 0.0 0.0 - 0.2 %    Comment: Performed at [facility], [place] [address], [place], [place] [address]  Troponin I (High Sensitivity)     Status: None   Collection Time: 01/29/20  9:19 AM  Result Value Ref Range   Troponin I (High Sensitivity) 4 <18 ng/L    Comment: (NOTE) Elevated high sensitivity troponin I (hsTnI) values and significant   changes across serial measurements may suggest ACS but many other  chronic and acute conditions are known to elevate hsTnI results.  Refer to the "Links" section for chest pain algorithms and additional  guidance. Performed at [facility], [place] [address] [place], [place] [address]   I-Stat beta hCG blood, ED     Status: None   Collection Time: 01/29/20  9:34 AM  Result Value Ref Range   I-stat hCG, quantitative <5.0 <5 mIU/mL   Comment 3            Comment:   GEST. AGE      CONC.  (mIU/mL)   <=1 WEEK        5 - 50     2 WEEKS       50 - 500     3 WEEKS       100 - 10,000     4 WEEKS     1,000 - 30,000        FEMALE AND NON-PREGNANT FEMALE:     LESS THAN 5 mIU/mL   Basic metabolic panel     Status: None   Collection Time: 02/26/20 10:12 AM  Result Value Ref Range   Glucose 98 65 - 99 mg/dL   BUN 10 6 - 24 mg/dL   Creatinine, Ser 0.93 0.57 - 1.00 mg/dL   GFR calc non Af Amer 69 >59 mL/min/1.73   GFR calc Af Amer 80 >59 mL/min/1.73    Comment: **In accordance with recommendations from the NKF-ASN Task force,**   [facility] is in the process of updating its eGFR calculation to the   2021 CKD-EPI creatinine equation that estimates kidney function   without a race variable.    BUN/Creatinine Ratio 11 9 - 23   Sodium 142 134 - 144 mmol/L   Potassium 4.5 3.5 - 5.2 mmol/L   Chloride 101 96 - 106 mmol/L   CO2 27 20 - 29 mmol/L   Calcium 9.2 8.7 - 10.2 mg/dL  SARS CORONAVIRUS 2 (TAT 6-24 HRS) Nasopharyngeal Nasopharyngeal Swab     Status: Abnormal   Collection Time: 03/08/20 10:50 AM   Specimen: Nasopharyngeal Swab  Result Value Ref Range   SARS Coronavirus 2 POSITIVE (A) NEGATIVE    Comment: (NOTE) SARS-CoV-2 target nucleic acids are DETECTED.  The SARS-CoV-2 RNA is generally detectable in upper and lower respiratory specimens during the acute phase of infection. Positive results are indicative of the presence of SARS-CoV-2 RNA. Clinical correlation with patient history  and other diagnostic information is  necessary to determine patient infection status. Positive results do not rule out bacterial infection or co-infection with other viruses.  The expected result is Negative.  Fact Sheet for Patients: SugarRoll.be  Fact Sheet for Healthcare Providers: https://www.woods-mathews.com/  This test is not yet approved or cleared by the [place] FDA and  has been authorized for detection and/or diagnosis of SARS-CoV-2 by FDA under an Emergency Use Authorization (EUA). This EUA will remain  in effect (meaning this test can be used) for the  duration of the COVID-19 declaration under Section 564(b)(1) of the Act, 21 U. S.C. section 360bbb-3(b)(1), unless the authorization is terminated or revoked sooner.   Performed at [facility], [place] [address], [place], [place] [address]      Assessment: 55 y.o. female with mild/moderate COVID 19 viral infection diagnosed on 1/7 at high risk for progression to severe COVID 19.  Plan:  This patient is a 55 y.o. female that meets the following criteria for Emergency Use Authorization of: Paxlovid 1. Age >12 yr AND > 40 kg 2. SARS-COV-2 positive test 3. Symptom onset < 5 days 4. Mild-to-moderate COVID disease with high risk for severe progression to hospitalization or death  I have spoken and communicated the following to the patient or parent/caregiver regarding: 1. Paxlovid is an unapproved drug that is authorized for use under an Emergency Use Authorization.  2. There are no adequate, approved, available products for the treatment of COVID-19 in adults who have mild-to-moderate COVID-19 and are at high risk for progressing to severe COVID-19, including hospitalization or death. 3. Other therapeutics are currently authorized. For additional information on all products authorized for treatment or prevention of COVID-19, please see  TanEmporium.pl.  4. There are benefits and risks of taking this treatment as outlined in the "Fact Sheet for Patients and Caregivers."  5. "Fact Sheet for Patients and Caregivers" was reviewed with patient. A hard copy will be provided to patient from pharmacy prior to the patient receiving treatment. 6. Patients should continue to self-isolate and use infection control measures (e.g., wear mask, isolate, social distance, avoid sharing personal items, clean and disinfect "high touch" surfaces, and frequent handwashing) according to CDC guidelines.  7. The patient or parent/caregiver has the option to accept or refuse treatment. 8. Patient medication history was reviewed for potential drug interactions:Interaction with home meds: CRESTOR which she will hold and Xanax which she will take half dose PRN 9. Patient's creatinine clearance was calculated to be 98, and they were therefore prescribed Normal dose (CrCl>60) - nirmatrelvir 141m tab (2 tablet) by mouth twice daily AND ritonavir 1039mtab (1 tablet) by mouth twice daily   After reviewing above information with the patient, the patient agrees to receive Paxlovid.  Follow up instructions:    . Take prescription BID x 5 days as directed . Reach out to pharmacist for counseling on medication if desired . For concerns regarding further COVID symptoms please follow up with your PCP or urgent care . For urgent or life-threatening issues, seek care at your local emergency department  The patient was provided an opportunity to ask questions, and all were answered. The patient agreed with the plan and demonstrated an understanding of the instructions.   [facility]  The patient was advised to call their PCP or seek an in-person evaluation if the symptoms worsen or if the condition  fails to improve as anticipated.   I provided 20 minutes of non face-to-face telephone visit time during this encounter, and > 50% was spent counseling as documented under my assessment & plan.  KaAngelena FormPA-C 03/11/2020 /10:04 AM

## 2020-03-12 ENCOUNTER — Encounter (HOSPITAL_COMMUNITY): Admission: RE | Payer: Self-pay | Source: Home / Self Care

## 2020-03-12 ENCOUNTER — Ambulatory Visit (HOSPITAL_COMMUNITY): Admission: RE | Admit: 2020-03-12 | Payer: Medicare HMO | Source: Home / Self Care | Admitting: Cardiology

## 2020-03-12 SURGERY — LEFT HEART CATH AND CORONARY ANGIOGRAPHY
Anesthesia: LOCAL

## 2020-03-15 ENCOUNTER — Other Ambulatory Visit: Payer: Medicare Other

## 2020-03-22 DIAGNOSIS — I4891 Unspecified atrial fibrillation: Secondary | ICD-10-CM | POA: Diagnosis not present

## 2020-03-26 NOTE — Progress Notes (Signed)
Spoke to patient and discussed results of cardiac event monitor. Patient verbalized understanding of results. Will discuss further at next office visit.

## 2020-03-28 ENCOUNTER — Institutional Professional Consult (permissible substitution): Payer: Self-pay | Admitting: Neurology

## 2020-04-01 ENCOUNTER — Ambulatory Visit: Payer: Medicare HMO | Admitting: Cardiology

## 2020-04-02 ENCOUNTER — Encounter: Payer: Self-pay | Admitting: Nurse Practitioner

## 2020-04-02 ENCOUNTER — Telehealth: Payer: Self-pay | Admitting: Nurse Practitioner

## 2020-04-02 NOTE — Telephone Encounter (Signed)
Patient returned called to mAb infusion on call provider. She reports that she began to have symptoms of muscle pain 3 days after starting the Paxlovid treatment (6 doses). She reports that she notified the infusion center and was informed to stop taking the medication. She states that since stopping the medication she has experienced severe muscle pain in her arms and legs and tremors in her upper extremities. She tells me that her tremors are so severe she is unable to dial a phone.   Patients states that she was not told why she was taking the medication or the medication side effects. She also tells me that she does not recall speaking to anyone about the medication.   Chart review details that a 20 minute visit was spent with the provider discussing her symptoms of COVID-19 and treatment options available. Documentation on verbal instructions of medication side effects is present as well as and written information provided through a MyChart message.   She did report that the paperwork she received from the pharmacy tells her that the medication can cause muscle pain.   Recommendations to the patient are to follow-up with her PCP for an in person evaluation for muscle pain and tremors. The patient was also provided with written information on where to report medication side effects and this was gone over verbally with the patient.   The patient requests that I speak to her primary care provider as she states that she has been in contact with them and they have told her to contact us. I told the patient I will gladly speak to the Primary Care Provider and that she may call the oncall number listed.

## 2020-04-02 NOTE — Telephone Encounter (Signed)
On call provider with [facility] COVID mAb clinic contacted from [place] after patient called with concerns for reported worsening neuropathy in her upper extremities, arm and leg pain, and "brown splotches" on her skin.   Pharmacy reports that the patient did not have recollection of the encounter with the mAb clinic provider or getting the medication and was asking who the provider was who prescribed the medication for her. She reportedly attributes the new symptoms to the use of Paxlovid treatment.   Chart review reveals patient was contacted by the mAb infusion clinic for treatment options after positive COVID test and was prescribed oral therapy with Paxlovid on 03/11/2020. Pharmacy report reveals the medication was picked up on 03/11/2020 at 1102.   Chart review reveals that patient does have a history of RLS and neuropathy associated with chemotherapy. She is prescribed pregabalin daily.   Voicemail left for patient with recommendations to follow-up with PCP if her symptoms are worsening or bothersome. Also provided patient with information through [facility] on how to report the symptoms and with recommendations to seek care with PCP.

## 2020-04-08 DIAGNOSIS — R9439 Abnormal result of other cardiovascular function study: Secondary | ICD-10-CM | POA: Diagnosis present

## 2020-04-08 DIAGNOSIS — I209 Angina pectoris, unspecified: Secondary | ICD-10-CM | POA: Diagnosis present

## 2020-04-09 ENCOUNTER — Ambulatory Visit (HOSPITAL_COMMUNITY)
Admission: RE | Admit: 2020-04-09 | Discharge: 2020-04-09 | Disposition: A | Discharge status: Home / Self Care | Payer: Commercial Managed Care - PPO | Attending: Cardiology | Admitting: Cardiology

## 2020-04-09 ENCOUNTER — Encounter (HOSPITAL_COMMUNITY)
Admission: RE | Disposition: A | Discharge status: Home / Self Care | Payer: Self-pay | Source: Home / Self Care | Attending: Cardiology

## 2020-04-09 ENCOUNTER — Encounter (HOSPITAL_COMMUNITY): Payer: Self-pay | Admitting: Cardiology

## 2020-04-09 DIAGNOSIS — I10 Essential (primary) hypertension: Secondary | ICD-10-CM | POA: Diagnosis not present

## 2020-04-09 DIAGNOSIS — J449 Chronic obstructive pulmonary disease, unspecified: Secondary | ICD-10-CM | POA: Diagnosis not present

## 2020-04-09 DIAGNOSIS — Z8249 Family history of ischemic heart disease and other diseases of the circulatory system: Secondary | ICD-10-CM | POA: Insufficient documentation

## 2020-04-09 DIAGNOSIS — I480 Paroxysmal atrial fibrillation: Secondary | ICD-10-CM | POA: Insufficient documentation

## 2020-04-09 DIAGNOSIS — Z7982 Long term (current) use of aspirin: Secondary | ICD-10-CM | POA: Insufficient documentation

## 2020-04-09 DIAGNOSIS — I209 Angina pectoris, unspecified: Secondary | ICD-10-CM | POA: Diagnosis present

## 2020-04-09 DIAGNOSIS — R079 Chest pain, unspecified: Secondary | ICD-10-CM | POA: Insufficient documentation

## 2020-04-09 DIAGNOSIS — F17210 Nicotine dependence, cigarettes, uncomplicated: Secondary | ICD-10-CM | POA: Diagnosis not present

## 2020-04-09 DIAGNOSIS — R9439 Abnormal result of other cardiovascular function study: Secondary | ICD-10-CM | POA: Diagnosis not present

## 2020-04-09 DIAGNOSIS — Z79899 Other long term (current) drug therapy: Secondary | ICD-10-CM | POA: Insufficient documentation

## 2020-04-09 HISTORY — PX: LEFT HEART CATH AND CORONARY ANGIOGRAPHY: CATH118249

## 2020-04-09 LAB — CBC
HCT: 41.2 % (ref 36.0–46.0)
Hemoglobin: 13.8 g/dL (ref 12.0–15.0)
MCH: 29.1 pg (ref 26.0–34.0)
MCHC: 33.5 g/dL (ref 30.0–36.0)
MCV: 86.7 fL (ref 80.0–100.0)
Platelets: 324 10*3/uL (ref 150–400)
RBC: 4.75 MIL/uL (ref 3.87–5.11)
RDW: 12.7 % (ref 11.5–15.5)
WBC: 8.9 10*3/uL (ref 4.0–10.5)
nRBC: 0 % (ref 0.0–0.2)

## 2020-04-09 LAB — BASIC METABOLIC PANEL
Anion gap: 11 (ref 5–15)
BUN: 21 mg/dL — ABNORMAL HIGH (ref 6–20)
CO2: 24 mmol/L (ref 22–32)
Calcium: 9.3 mg/dL (ref 8.9–10.3)
Chloride: 105 mmol/L (ref 98–111)
Creatinine, Ser: 0.86 mg/dL (ref 0.44–1.00)
GFR, Estimated: >60 mL/min (ref >60)
Glucose, Bld: 86 mg/dL (ref 70–99)
Potassium: 4 mmol/L (ref 3.5–5.1)
Sodium: 140 mmol/L (ref 135–145)

## 2020-04-09 SURGERY — LEFT HEART CATH AND CORONARY ANGIOGRAPHY
Anesthesia: LOCAL

## 2020-04-09 MED ORDER — SODIUM CHLORIDE 0.9% FLUSH: 3.0000 mL | Freq: Two times a day (BID) | INTRAVENOUS | Status: DC

## 2020-04-09 MED ORDER — MIDAZOLAM HCL 2 MG/2ML IJ SOLN
INTRAMUSCULAR | Status: AC
  Filled 2020-04-09: qty 2

## 2020-04-09 MED ORDER — SODIUM CHLORIDE 0.9 % IV SOLN: INTRAVENOUS | Status: DC

## 2020-04-09 MED ORDER — ACETAMINOPHEN 325 MG PO TABS: 650.0000 mg | ORAL_TABLET | ORAL | Status: DC | PRN

## 2020-04-09 MED ORDER — FENTANYL CITRATE (PF) 100 MCG/2ML IJ SOLN
INTRAMUSCULAR | Status: DC | PRN
  Administered 2020-04-09 (×3): 50 ug via INTRAVENOUS

## 2020-04-09 MED ORDER — HEPARIN (PORCINE) IN NACL 1000-0.9 UT/500ML-% IV SOLN
INTRAVENOUS | Status: DC | PRN
  Administered 2020-04-09: 500 mL

## 2020-04-09 MED ORDER — LIDOCAINE HCL (PF) 1 % IJ SOLN
INTRAMUSCULAR | Status: DC | PRN
  Administered 2020-04-09: 3 mL

## 2020-04-09 MED ORDER — ONDANSETRON HCL 4 MG/2ML IJ SOLN: 4.0000 mg | Freq: Four times a day (QID) | INTRAMUSCULAR | Status: DC | PRN

## 2020-04-09 MED ORDER — SODIUM CHLORIDE 0.9% FLUSH: 3.0000 mL | INTRAVENOUS | Status: DC | PRN

## 2020-04-09 MED ORDER — HEPARIN SODIUM (PORCINE) 1000 UNIT/ML IJ SOLN
INTRAMUSCULAR | Status: DC | PRN
  Administered 2020-04-09: 4500 [IU] via INTRAVENOUS

## 2020-04-09 MED ORDER — NITROGLYCERIN 0.4 MG SL SUBL
SUBLINGUAL_TABLET | SUBLINGUAL | Status: AC
  Administered 2020-04-09: 0.4 mg
  Filled 2020-04-09: qty 1

## 2020-04-09 MED ORDER — HEPARIN SODIUM (PORCINE) 1000 UNIT/ML IJ SOLN
INTRAMUSCULAR | Status: AC
  Filled 2020-04-09: qty 1

## 2020-04-09 MED ORDER — MIDAZOLAM HCL 2 MG/2ML IJ SOLN
INTRAMUSCULAR | Status: DC | PRN
  Administered 2020-04-09 (×2): 2 mg via INTRAVENOUS

## 2020-04-09 MED ORDER — SODIUM CHLORIDE 0.9 % WEIGHT BASED INFUSION: 1.0000 mL/kg/h | INTRAVENOUS | Status: DC

## 2020-04-09 MED ORDER — FENTANYL CITRATE (PF) 100 MCG/2ML IJ SOLN
INTRAMUSCULAR | Status: AC
  Filled 2020-04-09: qty 2

## 2020-04-09 MED ORDER — SODIUM CHLORIDE 0.9 % IV SOLN: 250.0000 mL | INTRAVENOUS | Status: DC | PRN

## 2020-04-09 MED ORDER — DIPHENHYDRAMINE HCL 50 MG/ML IJ SOLN
INTRAMUSCULAR | Status: DC | PRN
  Administered 2020-04-09: 25 mg via INTRAVENOUS

## 2020-04-09 MED ORDER — SODIUM CHLORIDE 0.9 % WEIGHT BASED INFUSION
3.0000 mL/kg/h | INTRAVENOUS | Status: AC
  Administered 2020-04-09: 3 mL/kg/h via INTRAVENOUS

## 2020-04-09 MED ORDER — NITROGLYCERIN 0.4 MG SL SUBL: 0.4000 mg | SUBLINGUAL_TABLET | SUBLINGUAL | Status: DC | PRN

## 2020-04-09 MED ORDER — LIDOCAINE HCL (PF) 1 % IJ SOLN
INTRAMUSCULAR | Status: AC
  Filled 2020-04-09: qty 30

## 2020-04-09 MED ORDER — ASPIRIN 81 MG PO CHEW: 81.0000 mg | CHEWABLE_TABLET | ORAL | Status: DC

## 2020-04-09 MED ORDER — IOHEXOL 350 MG/ML SOLN
INTRAVENOUS | Status: DC | PRN
  Administered 2020-04-09: 30 mL

## 2020-04-09 MED ORDER — LABETALOL HCL 5 MG/ML IV SOLN: 10.0000 mg | INTRAVENOUS | Status: DC | PRN

## 2020-04-09 MED ORDER — HEPARIN (PORCINE) IN NACL 1000-0.9 UT/500ML-% IV SOLN
INTRAVENOUS | Status: AC
  Filled 2020-04-09: qty 500

## 2020-04-09 MED ORDER — VERAPAMIL HCL 2.5 MG/ML IV SOLN
INTRAVENOUS | Status: DC | PRN
  Administered 2020-04-09: 10 mL via INTRA_ARTERIAL

## 2020-04-09 MED ORDER — DIPHENHYDRAMINE HCL 50 MG/ML IJ SOLN
INTRAMUSCULAR | Status: AC
  Filled 2020-04-09: qty 1

## 2020-04-09 MED ORDER — HYDRALAZINE HCL 20 MG/ML IJ SOLN: 10.0000 mg | INTRAMUSCULAR | Status: DC | PRN

## 2020-04-09 MED ORDER — VERAPAMIL HCL 2.5 MG/ML IV SOLN
INTRAVENOUS | Status: AC
  Filled 2020-04-09: qty 2

## 2020-04-09 SURGICAL SUPPLY — 13 items
BAG SNAP BAND KOVER 36X36 (MISCELLANEOUS) ×1 IMPLANT
CATH INFINITI JR4 5F (CATHETERS) ×1 IMPLANT
CATH OPTITORQUE TIG 4.0 5F (CATHETERS) ×1 IMPLANT
COVER DOME SNAP 22 D (MISCELLANEOUS) ×1 IMPLANT
DEVICE RAD COMP TR BAND LRG (VASCULAR PRODUCTS) ×1 IMPLANT
GLIDESHEATH SLEND A-KIT 6F 22G (SHEATH) ×1 IMPLANT
GUIDEWIRE INQWIRE 1.5J.035X260 (WIRE) IMPLANT
INQWIRE 1.5J .035X260CM (WIRE) ×2
KIT HEART LEFT (KITS) ×2 IMPLANT
PACK CARDIAC CATHETERIZATION (CUSTOM PROCEDURE TRAY) ×2 IMPLANT
SYR MEDRAD MARK 7 150ML (SYRINGE) ×2 IMPLANT
TRANSDUCER W/STOPCOCK (MISCELLANEOUS) ×2 IMPLANT
TUBING CIL FLEX 10 FLL-RA (TUBING) ×2 IMPLANT

## 2020-04-09 NOTE — Progress Notes (Signed)
C/o mid chest pain and pressure. Rates pain as a 7. EKG completed. Dr Virgina Jock paged. bp 131/ 82 sat 100% pulse 68 resp 18

## 2020-04-09 NOTE — H&P (Signed)
Sheri Calderon is an 55 y.o. female.   Chief Complaint: Chest pain HPI:   55 year old Caucasian female with hypertension, tobacco dependence, COPD, suspected OSA, paroxysmal atrial fibrillation, exertional chest pain. Stress test findings with mild perfusion abnormality, non-limiting chest pain, and EKG changes, TID 1.2. In view of high risk stress test findings, recommend coronary angiography with possible intervention, in addition to ongoing medical therapy. Will make recommendations re: anticoagulation for Afib after coronary angiogram.    Patient has had COVID since her last office visit in 03/2020 and recovered well. She continues to have frequent chest pain episodes.    Past Medical History:  Diagnosis Date  . Allergic rhinitis   . Anxiety   . Atrial fibrillation ([place])    ~ 06/2010 single episdose, evaluted at [facility] with testing. No reccurence. Does not see cardiologist (08/01/2018)  . Colon cancer [facility]) 2014   cancer of the sigmoid colon  . Depression   . Diverticulitis    "this is my 2nd time in hospital w/this in the last 2 wk" (06/21/2012)  . Dysrhythmia    1 time issue with A. Fib. In the hospital for 1 week. Spontaneously convereted to NSR.  Marland Kitchen GERD (gastroesophageal reflux disease)   . Gout   . Hypertension   . Lower extremity edema   . Migraines   . Osteoarthritis   . Peripheral neuropathy   . PTSD (post-traumatic stress disorder)   . Restless legs syndrome   . S/P chemotherapy, time since less than 4 weeks   . Tremor, essential     Past Surgical History:  Procedure Laterality Date  . APPENDECTOMY    . CARDIOVASCULAR STRESS TEST     06/24/10 [facility]): No ischemia, EF 53%.  . COLONOSCOPY N/A 08/01/2017   Procedure: COLONOSCOPY;  Surgeon: Lin Landsman, MD;  Location: [facility] ENDOSCOPY;  Service: Gastroenterology;  Laterality: N/A;  . INCISIONAL HERNIA REPAIR  08/03/2018  . INCISIONAL HERNIA REPAIR N/A 08/03/2018   Procedure: LAPAROSCOPIC  INCISIONAL HERNIA REPAIR WITH MESH;  Surgeon: Coralie Keens, MD;  Location: [place];  Service: General;  Laterality: N/A;  . PARTIAL COLECTOMY N/A 06/28/2012   Procedure: PARTIAL COLECTOMY;  Surgeon: Harl Bowie, MD;  Location: [place];  Service: General;  Laterality: N/A;  . PORTACATH PLACEMENT N/A 07/11/2012   Procedure: INSERTION PORT-A-CATH;  Surgeon: Harl Bowie, MD;  Location: [facility];  Service: General;  Laterality: N/A;  . portacath removal    . TRANSTHORACIC ECHOCARDIOGRAM     06/22/10 [facility]): LVEF 55%. Normal LA size. Mild MR/TR. Trace PI.  . TUBAL LIGATION  ~ 1989     Family History  Problem Relation Age of Onset  . Diabetes Mother   . Heart disease Mother   . Stomach cancer Mother   . Ovarian cancer Mother 5  . COPD Father   . Hypertension Father   . Hyperlipidemia Father   . Colon polyps Father        80 lifetime colon polyps  . Down syndrome Paternal Aunt   . Cancer Paternal Uncle        cancer in the spine  . COPD Maternal Grandmother   . Stomach cancer Paternal Grandmother   . Vascular Disease Brother   . Aneurysm Brother     Social History:  reports that she has been smoking cigarettes. She started smoking about 29 years ago. She has a 3.00 pack-year smoking history. She has never used smokeless tobacco. She reports that she  does not drink alcohol and does not use drugs.  Allergies:  Allergies  Allergen Reactions  . Lorazepam Hives and Swelling    Patient reports receiving lorazepam intensol in the hospital and experienced swelling with hives.  . Gabapentin Rash    Review of Systems  Constitutional: Negative for decreased appetite, malaise/fatigue, weight gain and weight loss.  HENT: Negative for congestion.   Eyes: Negative for visual disturbance.  Cardiovascular: Positive for chest pain. Negative for dyspnea on exertion, leg swelling, palpitations and syncope.  Respiratory: Negative for cough.   Endocrine: Negative for cold  intolerance.  Hematologic/Lymphatic: Does not bruise/bleed easily.  Skin: Negative for itching and rash.  Musculoskeletal: Negative for myalgias.  Gastrointestinal: Negative for abdominal pain, nausea and vomiting.  Genitourinary: Negative for dysuria.  Neurological: Negative for dizziness and weakness.  Psychiatric/Behavioral: The patient is not nervous/anxious.   All other systems reviewed and are negative.    Blood pressure 137/89, pulse 71, temperature 98.4 F (36.9 C), temperature source Oral, height 5' 7"  (1.702 m), weight 92.5 kg, last menstrual period 10/12/2012, SpO2 100 %. Body mass index is 31.95 kg/m.  Physical Exam Vitals and nursing note reviewed.  Constitutional:      General: She is not in acute distress.    Appearance: She is well-developed.  HENT:     Head: Normocephalic and atraumatic.  Eyes:     Conjunctiva/sclera: Conjunctivae normal.     Pupils: Pupils are equal, round, and reactive to light.  Neck:     Vascular: No JVD.  Cardiovascular:     Rate and Rhythm: Normal rate and regular rhythm.     Pulses: Normal pulses and intact distal pulses.     Heart sounds: No murmur heard.   Pulmonary:     Effort: Pulmonary effort is normal.     Breath sounds: Normal breath sounds. No wheezing or rales.  Abdominal:     General: Bowel sounds are normal.     Palpations: Abdomen is soft.     Tenderness: There is no rebound.  Musculoskeletal:        General: No tenderness. Normal range of motion.     Left lower leg: No edema.  Lymphadenopathy:     Cervical: No cervical adenopathy.  Skin:    General: Skin is warm and dry.  Neurological:     Mental Status: She is alert and oriented to person, place, and time.     Cranial Nerves: No cranial nerve deficit.     Results for orders placed or performed during the hospital encounter of 04/09/20 (from the past 48 hour(s))  CBC     Status: None   Collection Time: 04/09/20 10:08 AM  Result Value Ref Range   WBC 8.9  4.0 - 10.5 K/uL   RBC 4.75 3.87 - 5.11 MIL/uL   Hemoglobin 13.8 12.0 - 15.0 g/dL   HCT 41.2 36.0 - 46.0 %   MCV 86.7 80.0 - 100.0 fL   MCH 29.1 26.0 - 34.0 pg   MCHC 33.5 30.0 - 36.0 g/dL   RDW 12.7 11.5 - 15.5 %   Platelets 324 150 - 400 K/uL   nRBC 0.0 0.0 - 0.2 %    Comment: Performed at [facility], [place] [address], [place], [place] [address]  Basic metabolic panel     Status: Abnormal   Collection Time: 04/09/20 10:08 AM  Result Value Ref Range   Sodium 140 135 - 145 mmol/L   Potassium 4.0 3.5 - 5.1  mmol/L   Chloride 105 98 - 111 mmol/L   CO2 24 22 - 32 mmol/L   Glucose, Bld 86 70 - 99 mg/dL    Comment: Glucose reference range applies only to samples taken after fasting for at least 8 hours.   BUN 21 (H) 6 - 20 mg/dL   Creatinine, Ser 0.86 0.44 - 1.00 mg/dL   Calcium 9.3 8.9 - 10.3 mg/dL   GFR, Estimated >60 >60 mL/min    Comment: (NOTE) Calculated using the CKD-EPI Creatinine Equation (2021)    Anion gap 11 5 - 15    Comment: Performed at [place] [address], [place],  [address]    Labs:   Lab Results  Component Value Date   WBC 8.9 04/09/2020   HGB 13.8 04/09/2020   HCT 41.2 04/09/2020   MCV 86.7 04/09/2020   PLT 324 04/09/2020    Recent Labs  Lab 04/09/20 1008  NA 140  K 4.0  CL 105  CO2 24  BUN 21*  CREATININE 0.86  CALCIUM 9.3  GLUCOSE 86    Lipid Panel  No results found for: CHOL, TRIG, HDL, CHOLHDL, VLDL, LDLCALC  BNP (last 3 results) No results for input(s): BNP in the last 8760 hours.  HEMOGLOBIN A1C Lab Results  Component Value Date   HGBA1C 5.7 (H) 05/13/2018   MPG 116.89 05/13/2018     TSH Recent Labs    01/25/20 0504  TSH 0.593     Medications Prior to Admission  Medication Sig Dispense Refill  . albuterol (VENTOLIN HFA) 108 (90 Base) MCG/ACT inhaler Inhale 1 puff into the lungs every 6 (six) hours as needed for wheezing or shortness of breath.    . ALPRAZolam (XANAX) 0.5 MG tablet Take 0.5 mg  by mouth 3 (three) times daily as needed for anxiety.  2  . Aspirin 81 MG CAPS Take 81 mg by mouth daily. 90 capsule 3  . lisinopril (ZESTRIL) 40 MG tablet Take 1 tablet (40 mg total) by mouth daily. 90 tablet 3  . metoprolol tartrate (LOPRESSOR) 25 MG tablet Take 1 tablet (25 mg total) by mouth 2 (two) times daily. 60 tablet 0  . nitroGLYCERIN (NITROSTAT) 0.4 MG SL tablet Place 1 tablet (0.4 mg total) under the tongue every 5 (five) minutes as needed for chest pain. 90 tablet 3  . omeprazole (PRILOSEC) 20 MG capsule Take 20 mg by mouth daily.    . ondansetron (ZOFRAN) 8 MG tablet Take 8 mg by mouth every 8 (eight) hours as needed for vomiting or nausea.    . pregabalin (LYRICA) 150 MG capsule Take 150 mg by mouth 2 (two) times daily.    . prochlorperazine (COMPAZINE) 10 MG tablet Take 10 mg by mouth every 8 (eight) hours as needed for nausea or vomiting.    . rizatriptan (MAXALT) 10 MG tablet Take 10 mg by mouth See admin instructions. Take 10 mg by mouth at onset of headache. Repeat in 2 hours if needed. Maximum 2 tablets in 24 hours.  3  . rosuvastatin (CRESTOR) 20 MG tablet Take 1 tablet (20 mg total) by mouth daily. 90 tablet 3      Current Facility-Administered Medications:  .  0.9 %  sodium chloride infusion, 250 mL, Intravenous, PRN, Laray Corbit J, MD .  [EXPIRED] 0.9% sodium chloride infusion, 3 mL/kg/hr, Intravenous, Continuous, Last Rate: 277.5 mL/hr at 04/09/20 0944, 3 mL/kg/hr at 04/09/20 0944 **FOLLOWED BY** 0.9% sodium chloride infusion, 1 mL/kg/hr, Intravenous, Continuous,  Nigel Mormon, MD .  Derrill Memo ON 04/10/2020] aspirin chewable tablet 81 mg, 81 mg, Oral, Pre-Cath, Dekayla Prestridge J, MD .  sodium chloride flush (NS) 0.9 % injection 3 mL, 3 mL, Intravenous, Q12H, Kessa Fairbairn J, MD .  sodium chloride flush (NS) 0.9 % injection 3 mL, 3 mL, Intravenous, PRN, Nikolaus Pienta J, MD   Today's Vitals   04/09/20 0858 04/09/20 0919  BP: 137/89   Pulse: 71    Temp: 98.4 F (36.9 C)   TempSrc: Oral   SpO2: 100%   Weight: 92.5 kg   Height: 5' 7"  (1.702 m)   PainSc:  5    Body mass index is 31.95 kg/m.   CARDIAC STUDIES:  EKG 04/09/2020: Sinus rhythm Normal EKG  Echocardiogram 03/06/2020:  Left ventricle cavity is normal in size and wall thickness. Normal global  wall motion. Normal LV systolic function with EF 61%. Normal diastolic  filling pattern. No significant valvular abnormalities.  No evidence of pulmonary hypertension.   Exercise Myoview stress test 03/04/2020: Exercise nuclear stress test was performed using Bruce protocol. Patient reached 7 METS, and 87% of age predicted maximum heart rate. Exercise capacity was fair. Non-limiting chest pain reported. Heart rate and hemodynamic response were normal.  Peak stress EKG showed 1.5-2 mm horizontal ST depressions in leads V5,V6, 1.5 mm upsloping ST depressions in leads V4, I, II, <1 mm ST elevations in leads V1-V4; persist beyond 2 min into recovery.  1 Day Rest/Stress Protocol. TID index is 1.21. All segments of left ventricle demonstrated normal wall motion and thickening. Stress LV EF is normal 65%.  SPECT images showed small sized, mild intensity perfusion defect in apical to basal inferior/inferoseptal myocardium, worse on rest images. While tissue attenuation is possible, EKG changes and TID 1.2 suggest possible global ischemia, High risk study.    Assessment/Plan  55 year old Caucasian female with hypertension, tobacco dependence, COPD, suspected OSA, paroxysmal atrial fibrillation, exertional chest pain, high risk stress test  Plan for coronary angiography, possible intervention   Nigel Mormon, MD Pager: [phone] Office: [phone]

## 2020-04-09 NOTE — Progress Notes (Signed)
Pt rates pain now as 1 or maybe 2 after 1 NTG

## 2020-04-09 NOTE — Discharge Instructions (Signed)
Radial Site Care  This sheet gives you information about how to care for yourself after your procedure. Your health care provider may also give you more specific instructions. If you have problems or questions, contact your health care provider. What can I expect after the procedure? After the procedure, it is common to have:  Bruising and tenderness at the catheter insertion area. Follow these instructions at home: Medicines  Take over-the-counter and prescription medicines only as told by your health care provider. Insertion site care  Follow instructions from your health care provider about how to take care of your insertion site. Make sure you: ? Wash your hands with soap and water before you change your bandage (dressing). If soap and water are not available, use hand sanitizer. ? Change your dressing as told by your health care provider. ? Leave stitches (sutures), skin glue, or adhesive strips in place. These skin closures may need to stay in place for 2 weeks or longer. If adhesive strip edges start to loosen and curl up, you may trim the loose edges. Do not remove adhesive strips completely unless your health care provider tells you to do that.  Check your insertion site every day for signs of infection. Check for: ? Redness, swelling, or pain. ? Fluid or blood. ? Pus or a bad smell. ? Warmth.  Do not take baths, swim, or use a hot tub until your health care provider approves.  You may shower 24-48 hours after the procedure, or as directed by your health care provider. ? Remove the dressing and gently wash the site with plain soap and water. ? Pat the area dry with a clean towel. ? Do not rub the site. That could cause bleeding.  Do not apply powder or lotion to the site. Activity  For 24 hours after the procedure, or as directed by your health care provider: ? Do not flex or bend the affected arm. ? Do not push or pull heavy objects with the affected arm. ? Do not drive  yourself home from the hospital or clinic. You may drive 24 hours after the procedure unless your health care provider tells you not to. ? Do not operate machinery or power tools.  Do not lift anything that is heavier than 10 lb (4.5 kg), or the limit that you are told, until your health care provider says that it is safe.  Ask your health care provider when it is okay to: ? Return to work or school. ? Resume usual physical activities or sports. ? Resume sexual activity.   General instructions  If the catheter site starts to bleed, raise your arm and put firm pressure on the site. If the bleeding does not stop, get help right away. This is a medical emergency.  If you went home on the same day as your procedure, a responsible adult should be with you for the first 24 hours after you arrive home.  Keep all follow-up visits as told by your health care provider. This is important. Contact a health care provider if:  You have a fever.  You have redness, swelling, or yellow drainage around your insertion site. Get help right away if:  You have unusual pain at the radial site.  The catheter insertion area swells very fast.  The insertion area is bleeding, and the bleeding does not stop when you hold steady pressure on the area.  Your arm or hand becomes pale, cool, tingly, or numb. These symptoms may represent a serious   problem that is an emergency. Do not wait to see if the symptoms will go away. Get medical help right away. Call your local emergency services (911 in the [place] Do not drive yourself to the hospital. Summary  After the procedure, it is common to have bruising and tenderness at the site.  Follow instructions from your health care provider about how to take care of your radial site wound. Check the wound every day for signs of infection.  Do not lift anything that is heavier than 10 lb (4.5 kg), or the limit that you are told, until your health care provider says that it  is safe. This information is not intended to replace advice given to you by your health care provider. Make sure you discuss any questions you have with your health care provider. Document Revised: 03/24/2017 Document Reviewed: 03/24/2017 Elsevier Patient Education  2021 Elsevier Inc.  

## 2020-04-09 NOTE — Progress Notes (Signed)
Dr Virgina Jock called and informed pt pain now a 1. Pt asking of she can d/c home. HE states yes she can be d/c.

## 2020-04-09 NOTE — Progress Notes (Signed)
Discharge instructions reviewed with pt and her husband (via telephone) both voice understanding.

## 2020-04-09 NOTE — Interval H&P Note (Signed)
History and Physical Interval Note:  04/09/2020 11:35 AM  Sheri Calderon  has presented today for surgery, with the diagnosis of cad.  The various methods of treatment have been discussed with the patient and family. After consideration of risks, benefits and other options for treatment, the patient has consented to  Procedure(s): LEFT HEART CATH AND CORONARY ANGIOGRAPHY (N/A) as a surgical intervention.  The patient's history has been reviewed, patient examined, no change in status, stable for surgery.  I have reviewed the patient's chart and labs.  Questions were answered to the patient's satisfaction.    2016/2017 Appropriate Use Criteria for Coronary Revascularization Clinical Presentation: Diabetes Mellitus? Symptom Status? S/P CABG? Antianginal Therapy (# of long-acting drugs)? Results of Non-invasive testing? FFR/iFR results in all diseased vessels? Patient undergoing renal transplant? Patient undergoing percutaneous valve procedure (TAVR, MitraClip, Others)? Symptom Status:  Ischemic Symptoms  Non-invasive Testing:  High risk  If no or indeterminate stress test, FFR/iFR results in all diseased vessels:  N/A  Diabetes Mellitus:  No  S/P CABG:  No  Antianginal therapy (number of long-acting drugs):  >=2  Patient undergoing renal transplant:  No  Patient undergoing percutaneous valve procedure:  No    newline 1 Vessel Disease PCI CABG  No proximal LAD involvement, No proximal left dominant LCX involvement A (8); Indication 2 M (6); Indication 2   Proximal left dominant LCX involvement A (8); Indication 5 A (8); Indication 5   Proximal LAD involvement A (8); Indication 5 A (8); Indication 5   newline 2 Vessel Disease  No proximal LAD involvement A (8); Indication 8 A (7); Indication 8   Proximal LAD involvement A (8); Indication 11 A (8); Indication 11   newline 3 Vessel Disease  Low disease complexity (e.g., focal stenoses, SYNTAX <=22) A (8); Indication 17 A (8); Indication 17    Intermediate or high disease complexity (e.g., SYNTAX >=23) M (6); Indication 21 A (9); Indication 21   newline Left Main Disease  Isolated LMCA disease: ostial or midshaft A (7); Indication 24 A (9); Indication 24   Isolated LMCA disease: bifurcation involvement M (6); Indication 25 A (9); Indication 25   LMCA ostial or midshaft, concurrent low disease burden multivessel disease (e.g., 1-2 additional focal stenoses, SYNTAX <=22) A (7); Indication 26 A (9); Indication 26   LMCA ostial or midshaft, concurrent intermediate or high disease burden multivessel disease (e.g., 1-2 additional bifurcation stenoses, long stenoses, SYNTAX >=23) M (4); Indication 27 A (9); Indication 27   LMCA bifurcation involvement, concurrent low disease burden multivessel disease (e.g., 1-2 additional focal stenoses, SYNTAX <=22) M (6); Indication 28 A (9); Indication 28   LMCA bifurcation involvement, concurrent intermediate or high disease burden multivessel disease (e.g., 1-2 additional bifurcation stenoses, long stenoses, SYNTAX >=23) R (3); Indication 29 A (9); Indication [place]

## 2020-04-09 NOTE — Progress Notes (Addendum)
NTG given  Dr Virgina Jock returned called informed we are doing an EKG and NTG given due to pt c/o mid chest pain.

## 2020-04-24 ENCOUNTER — Ambulatory Visit: Payer: Medicare HMO | Admitting: Cardiology

## 2020-04-24 DIAGNOSIS — I10 Essential (primary) hypertension: Secondary | ICD-10-CM | POA: Insufficient documentation

## 2020-04-24 DIAGNOSIS — I480 Paroxysmal atrial fibrillation: Secondary | ICD-10-CM | POA: Insufficient documentation

## 2020-04-24 NOTE — Progress Notes (Signed)
Rescheduled

## 2020-04-26 ENCOUNTER — Other Ambulatory Visit: Payer: Self-pay

## 2020-04-26 ENCOUNTER — Ambulatory Visit: Payer: Commercial Managed Care - PPO | Admitting: Student

## 2020-04-26 ENCOUNTER — Telehealth: Payer: Self-pay

## 2020-04-26 ENCOUNTER — Encounter: Payer: Self-pay | Admitting: Student

## 2020-04-26 VITALS — BP 146/95 | HR 86 | Temp 98.0°F | Resp 17 | Ht 67.0 in | Wt 208.0 lb

## 2020-04-26 DIAGNOSIS — I480 Paroxysmal atrial fibrillation: Secondary | ICD-10-CM | POA: Diagnosis not present

## 2020-04-26 DIAGNOSIS — M25511 Pain in right shoulder: Secondary | ICD-10-CM | POA: Diagnosis not present

## 2020-04-26 MED ORDER — HYDROCODONE-ACETAMINOPHEN 2.5-325 MG PO TABS: 1.0000 | ORAL_TABLET | Freq: Three times a day (TID) | ORAL | 0 refills | Status: DC

## 2020-04-26 MED ORDER — HYDROCODONE-ACETAMINOPHEN 5-325 MG PO TABS: 1.0000 | ORAL_TABLET | Freq: Three times a day (TID) | ORAL | 0 refills | Status: AC

## 2020-04-26 NOTE — Progress Notes (Signed)
Follow up visit  Subjective:   Sheri Calderon, female    DOB: Aug 20, 1965, 55 y.o.   MRN: [id number]     HPI  Chief Complaint  Patient presents with  . Shoulder Pain    55 y.o. Caucasian female with hypertension, tobacco dependence, COPD, suspected OSA, paroxysmal atrial fibrillation.    Given risk factors for CAD and symptoms of chest pressure, she underwent echocardiogram and exercise nuclear stress test, results below.  Patient unfortunately no showed her previously scheduled follow-up appointment.  She now presents for urgent visit with complaints of severe right shoulder and arm pain for the last 1 week.  States that she did not have symptoms until proximately 2 weeks following her cardiac catheterization, which was on February eighth 2022.  Patient has tried ice, heat, scents, Tylenol without relief.  States her arm feels heavy.  Patient denies known injury to the right shoulder or arm.  Patient denies chest pain, palpitations, dizziness, syncope, near syncope.  She does continue to have dyspnea on exertion.   Current Outpatient Medications on File Prior to Visit  Medication Sig Dispense Refill  . albuterol (VENTOLIN HFA) 108 (90 Base) MCG/ACT inhaler Inhale 1 puff into the lungs every 6 (six) hours as needed for wheezing or shortness of breath.    . ALPRAZolam (XANAX) 0.5 MG tablet Take 0.5 mg by mouth 3 (three) times daily as needed for anxiety.  2  . Aspirin 81 MG CAPS Take 81 mg by mouth daily. 90 capsule 3  . Butalbital-APAP-Caffeine 50-300-40 MG CAPS Take 1 tablet by mouth daily.    . DULoxetine (CYMBALTA) 60 MG capsule Take 1 capsule by mouth daily.    Marland Kitchen gemfibrozil (LOPID) 600 MG tablet Take 1 tablet by mouth daily.    Marland Kitchen lisinopril (ZESTRIL) 40 MG tablet Take 1 tablet (40 mg total) by mouth daily. 90 tablet 3  . metoprolol tartrate (LOPRESSOR) 25 MG tablet Take 1 tablet (25 mg total) by mouth 2 (two) times daily. 60 tablet 0  . nitroGLYCERIN (NITROSTAT) 0.4 MG SL  tablet Place 1 tablet (0.4 mg total) under the tongue every 5 (five) minutes as needed for chest pain. 90 tablet 3  . omeprazole (PRILOSEC) 20 MG capsule Take 20 mg by mouth daily.    . ondansetron (ZOFRAN) 8 MG tablet Take 8 mg by mouth every 8 (eight) hours as needed for vomiting or nausea.    . pregabalin (LYRICA) 150 MG capsule Take 150 mg by mouth 2 (two) times daily.    . prochlorperazine (COMPAZINE) 10 MG tablet Take 10 mg by mouth every 8 (eight) hours as needed for nausea or vomiting.    . rizatriptan (MAXALT) 10 MG tablet Take 10 mg by mouth See admin instructions. Take 10 mg by mouth at onset of headache. Repeat in 2 hours if needed. Maximum 2 tablets in 24 hours.  3  . rosuvastatin (CRESTOR) 20 MG tablet Take 1 tablet (20 mg total) by mouth daily. 90 tablet 3   No current facility-administered medications on file prior to visit.    Cardiovascular & other pertient studies:  EKG 04/26/2020: Sinus rhythm at a rate of 84 bpm.  Normal axis. Anteroseptal infarct-age indeterminate. Diffuse low voltage complexes.  Coronary angiography 04/09/2020: Normal coronary arteries No angiographic evidence of coronary artery disease Normal LVEDP   Echocardiogram 03/06/2020 Left ventricle cavity is normal in size and wall thickness. Normal global wall motion. Normal LV systolic function with EF 61%. Normal diastolic filling pattern. No  significant valvular abnormalities. No evidence of pulmonary hypertension.   Exercise Myoview stress test 03/04/2020: Exercise nuclear stress test was performed using Bruce protocol. Patient reached 7 METS, and 87% of age predicted maximum heart rate. Exercise capacity was fair. Non-limiting chest pain reported. Heart rate and hemodynamic response were normal.  Peak stress EKG showed 1.5-2 mm horizontal ST depressions in leads V5,V6, 1.5 mm upsloping ST depressions in leads V4, I, II, <1 mm ST elevations in leads V1-V4; persist beyond 2 min into recovery.  1 Day  Rest/Stress Protocol. TID index is 1.21. All segments of left ventricle demonstrated normal wall motion and thickening. Stress LV EF is normal 65%.  SPECT images showed small sized, mild intensity perfusion defect in apical to basal inferior/inferoseptal myocardium, worse on rest images. While tissue attenuation is possible, EKG changes and TID 1.2 suggest possible global ischemia, High risk study.   EKG 02/16/2020: Probable sinus rhythm 75 bpm Diffuse low voltage.  Anteroseptal infarct -age undetermined.   EKG 01/25/2020:  Atrial fibrillation with controlled ventricular response at a rate of 90 bpm   Recent labs: 04/09/2020: Glucose 86, BUN/Cr 21/0.86. EGFR >60. Na/K 140/4.0.  H/H 13/41. MCV 86. Platelets 324  02/26/2020: Glucose 98, BUN/Cr 10/0.93. EGFR 69. Na/K 142/4.5. Rest of the CMP normal  01/29/2020: Glucose 109, BUN/Cr 19/1.07. EGFR >60. Na/K 140/4.2. Rest of the CMP normal H/H 13.4/41.4. MCV 89.0. Platelets 406 0.59TSH  Normal  05/13/2018:  A1C 5.7%   Review of Systems  Constitutional: Negative for weight gain.  Cardiovascular: Positive for dyspnea on exertion. Negative for chest pain, claudication, leg swelling, near-syncope, orthopnea, palpitations, paroxysmal nocturnal dyspnea and syncope.  Hematologic/Lymphatic: Does not bruise/bleed easily.  Musculoskeletal: Positive for joint pain (right shoulder and arm ).  Gastrointestinal: Negative for melena.  Neurological: Negative for dizziness and weakness.         Vitals:   04/26/20 1216  BP: (!) 146/95  Pulse: 86  Resp: 17  Temp: 98 F (36.7 C)  SpO2: 99%     Body mass index is 32.58 kg/m. Filed Weights   04/26/20 1216  Weight: 208 lb (94.3 kg)     Objective:   Physical Exam Vitals reviewed.  Constitutional:      Comments: Patient is noticeably uncomfortable and tearful on exam.  Holding her right arm close to her body.  HENT:     Head: Normocephalic and atraumatic.  Neck:     Vascular: No  carotid bruit or JVD.  Cardiovascular:     Rate and Rhythm: Normal rate and regular rhythm.     Pulses: Intact distal pulses.     Heart sounds: S1 normal and S2 normal. No murmur heard. No gallop.      Comments: Right radial access site well-healed.  2+ pulses.  Capillary refill <2 seconds.  No hematoma, ecchymosis, erythema, swelling, drainage. Pulmonary:     Effort: Pulmonary effort is normal. No respiratory distress.     Breath sounds: No wheezing, rhonchi or rales.  Musculoskeletal:     Right shoulder: Tenderness present. Decreased range of motion (due to pain).     Right lower leg: No edema.     Left lower leg: No edema.     Comments: Pain in the right shoulder radiating to right wrist   Neurological:     Mental Status: She is alert.        Assessment & Recommendations:   55 y.o. Caucasian female with history of hypertension, palpitations, anxiety, bipolar 1 disorder, tobacco use,  polysubstance abuse, and colon cancer (2014)m now with paroxysmal Afib  Pain of the right shoulder: Patient's physical exam is consistent with musculoskeletal Elian allergy of right shoulder pain.  Symptoms did not begin until the inside 1 week following cardiac catheterization.  Do not suspect nerve root or injury otherwise as complication of recent left heart catheterization.  Advised patient to follow-up with PCP, however she reports she is only in the process of switching primary care providers. We will prescribe Hydrocodone-acetaminophen 5/325 mg 3 times daily for 5 days.  Recommend patient follow-up with PCP or orthopedics if pain does not improve.  Abnormal stress test: No significant coronary artery disease by cardiac catheterization on 04/09/2020. As patient is in significant distress secondary to right shoulder and arm pain today, briefly reviewed results of cardiac catheterization, however recommend patient schedule follow-up appointment to further discuss results and management  plan.  Follow-up with Dr. Virgina Jock as scheduled.  Patient was seen in collaboration with Dr. Einar Gip. He also reviewed patient's chart and examined the patient. Dr. Einar Gip is in agreement of the plan.    Alethia Berthold, PA-C 04/26/2020, 4:12 PM Office: [phone]

## 2020-04-29 NOTE — Telephone Encounter (Signed)
I am not sure what I had sent, Pt came in to see Celeste on Friday due to severe arm pain.

## 2020-05-02 ENCOUNTER — Other Ambulatory Visit: Payer: Self-pay

## 2020-05-02 DIAGNOSIS — I10 Essential (primary) hypertension: Secondary | ICD-10-CM

## 2020-05-02 DIAGNOSIS — I480 Paroxysmal atrial fibrillation: Secondary | ICD-10-CM

## 2020-05-02 MED ORDER — METOPROLOL TARTRATE 25 MG PO TABS: 25.0000 mg | ORAL_TABLET | Freq: Two times a day (BID) | ORAL | 1 refills | Status: DC

## 2020-05-06 ENCOUNTER — Encounter: Payer: Self-pay | Admitting: Neurology

## 2020-05-16 ENCOUNTER — Encounter: Payer: Self-pay | Admitting: Cardiology

## 2020-05-16 ENCOUNTER — Other Ambulatory Visit: Payer: Self-pay

## 2020-05-16 ENCOUNTER — Telehealth: Payer: Medicare Other | Admitting: Cardiology

## 2020-05-16 VITALS — BP 123/83

## 2020-05-16 DIAGNOSIS — I480 Paroxysmal atrial fibrillation: Secondary | ICD-10-CM

## 2020-05-16 NOTE — Progress Notes (Signed)
Follow up visit  Subjective:   Sheri Calderon, female    DOB: Feb 14, 1966, 55 y.o.   MRN: [id number]  I connected with the patient on 05/16/2020 by a telephone call and verified that I am speaking with the correct person using two identifiers.     I offered the patient a video enabled application for a virtual visit. Unfortunately, this could not be accomplished due to technical difficulties/lack of video enabled phone/computer. I discussed the limitations of evaluation and management by telemedicine and the availability of in person appointments. The patient expressed understanding and agreed to proceed.   This visit type was conducted due to national recommendations for restrictions regarding the COVID-19 Pandemic (e.g. social distancing).  This format is felt to be most appropriate for this patient at this time.  All issues noted in this document were discussed and addressed.  No physical exam was performed (except for noted visual exam findings with Tele health visits).  The patient has consented to conduct a Tele health visit and understands insurance will be billed.   Chief Complaint  Patient presents with  . Atrial Fibrillation  . Follow-up    2 week    55 y.o. Caucasian female with hypertension, tobacco dependence, COPD, suspected OSA, paroxysmal atrial fibrillation.    Patient has had right sided shoulder pain for the last few days. She had a virtual visit with her PCP and was referred to a neurologist. From cardiac standpoint, she is doing well. She ha snot had any chest pain. She has had  Only occasional symptoms of palpitations.     Current Outpatient Medications on File Prior to Visit  Medication Sig Dispense Refill  . albuterol (VENTOLIN HFA) 108 (90 Base) MCG/ACT inhaler Inhale 1 puff into the lungs every 6 (six) hours as needed for wheezing or shortness of breath.    . ALPRAZolam (XANAX) 0.5 MG tablet Take 0.5 mg by mouth 3 (three) times daily as needed for anxiety.  2  .  Aspirin 81 MG CAPS Take 81 mg by mouth daily. 90 capsule 3  . Butalbital-APAP-Caffeine 50-300-40 MG CAPS Take 1 tablet by mouth daily.    . DULoxetine (CYMBALTA) 60 MG capsule Take 1 capsule by mouth daily.    Marland Kitchen gemfibrozil (LOPID) 600 MG tablet Take 1 tablet by mouth daily.    Marland Kitchen lisinopril (ZESTRIL) 40 MG tablet Take 1 tablet (40 mg total) by mouth daily. 90 tablet 3  . metoprolol tartrate (LOPRESSOR) 25 MG tablet Take 1 tablet (25 mg total) by mouth 2 (two) times daily. 180 tablet 1  . nitroGLYCERIN (NITROSTAT) 0.4 MG SL tablet Place 1 tablet (0.4 mg total) under the tongue every 5 (five) minutes as needed for chest pain. 90 tablet 3  . omeprazole (PRILOSEC) 20 MG capsule Take 20 mg by mouth daily.    . pregabalin (LYRICA) 150 MG capsule Take 150 mg by mouth 2 (two) times daily.    . prochlorperazine (COMPAZINE) 10 MG tablet Take 10 mg by mouth every 8 (eight) hours as needed for nausea or vomiting.    . rizatriptan (MAXALT) 10 MG tablet Take 10 mg by mouth See admin instructions. Take 10 mg by mouth at onset of headache. Repeat in 2 hours if needed. Maximum 2 tablets in 24 hours.  3  . rosuvastatin (CRESTOR) 20 MG tablet Take 1 tablet (20 mg total) by mouth daily. 90 tablet 3   No current facility-administered medications on file prior to visit.    Cardiovascular &  other pertient studies:  EKG 04/26/2020: Sinus rhythm at a rate of 84 bpm.  Normal axis. Anteroseptal infarct-age indeterminate. Diffuse low voltage complexes.  Coronary angiography 04/09/2020: Normal coronary arteries No angiographic evidence of coronary artery disease Normal LVEDP   Echocardiogram 03/06/2020 Left ventricle cavity is normal in size and wall thickness. Normal global wall motion. Normal LV systolic function with EF 61%. Normal diastolic filling pattern. No significant valvular abnormalities. No evidence of pulmonary hypertension.   Exercise Myoview stress test 03/04/2020: Exercise nuclear stress test was  performed using Bruce protocol. Patient reached 7 METS, and 87% of age predicted maximum heart rate. Exercise capacity was fair. Non-limiting chest pain reported. Heart rate and hemodynamic response were normal.  Peak stress EKG showed 1.5-2 mm horizontal ST depressions in leads V5,V6, 1.5 mm upsloping ST depressions in leads V4, I, II, <1 mm ST elevations in leads V1-V4; persist beyond 2 min into recovery.  1 Day Rest/Stress Protocol. TID index is 1.21. All segments of left ventricle demonstrated normal wall motion and thickening. Stress LV EF is normal 65%.  SPECT images showed small sized, mild intensity perfusion defect in apical to basal inferior/inferoseptal myocardium, worse on rest images. While tissue attenuation is possible, EKG changes and TID 1.2 suggest possible global ischemia, High risk study.   EKG 02/16/2020: Probable sinus rhythm 75 bpm Diffuse low voltage.  Anteroseptal infarct -age undetermined.   EKG 01/25/2020:  Atrial fibrillation with controlled ventricular response at a rate of 90 bpm   Recent labs: 04/09/2020: Glucose 86, BUN/Cr 21/0.86. EGFR >60. Na/K 140/4.0.  H/H 13/41. MCV 86. Platelets 324  02/26/2020: Glucose 98, BUN/Cr 10/0.93. EGFR 69. Na/K 142/4.5. Rest of the CMP normal  01/29/2020: Glucose 109, BUN/Cr 19/1.07. EGFR >60. Na/K 140/4.2. Rest of the CMP normal H/H 13.4/41.4. MCV 89.0. Platelets 406 0.59TSH  Normal  05/13/2018:  A1C 5.7%   Review of Systems  Cardiovascular: Positive for palpitations. Negative for chest pain, dyspnea on exertion, leg swelling and syncope.  Musculoskeletal: Positive for joint pain.         Vitals:   05/16/20 1029  BP: 123/83      Objective:   Physical Exam Not performed. Telephone visit.    Assessment & Recommendations:   55 y.o. Caucasian female with hypertension, tobacco dependence, COPD, suspected OSA, paroxysmal atrial fibrillation.    Pain of the right shoulder: Musculoskeletal. Continue  f/uw/PCP  PAF: Occasional symptoms.  CHA2DS2VASc score 2 for female patient. Okay to omit anticoagulation.  F/u in 3 months   [employer], PA-C 05/16/2020, 10:16 AM Office: [phone]

## 2020-05-17 ENCOUNTER — Encounter: Payer: Self-pay | Admitting: Cardiology

## 2020-06-17 ENCOUNTER — Ambulatory Visit: Payer: Medicare HMO | Admitting: Neurology

## 2020-07-26 ENCOUNTER — Ambulatory Visit: Payer: Medicare Other | Admitting: Neurology

## 2020-11-27 ENCOUNTER — Other Ambulatory Visit: Payer: Self-pay | Admitting: Student

## 2020-11-27 DIAGNOSIS — I10 Essential (primary) hypertension: Secondary | ICD-10-CM

## 2020-11-27 DIAGNOSIS — I480 Paroxysmal atrial fibrillation: Secondary | ICD-10-CM

## 2021-02-23 ENCOUNTER — Encounter: Payer: Self-pay | Admitting: Emergency Medicine

## 2021-02-23 ENCOUNTER — Other Ambulatory Visit: Payer: Self-pay

## 2021-02-23 ENCOUNTER — Emergency Department
Admission: EM | Admit: 2021-02-23 | Discharge: 2021-02-23 | Disposition: A | Discharge status: Home / Self Care | Payer: Commercial Managed Care - PPO | Attending: Emergency Medicine | Admitting: Emergency Medicine

## 2021-02-23 ENCOUNTER — Emergency Department: Payer: Commercial Managed Care - PPO

## 2021-02-23 DIAGNOSIS — S0990XA Unspecified injury of head, initial encounter: Secondary | ICD-10-CM | POA: Diagnosis not present

## 2021-02-23 DIAGNOSIS — Z85038 Personal history of other malignant neoplasm of large intestine: Secondary | ICD-10-CM | POA: Insufficient documentation

## 2021-02-23 DIAGNOSIS — R42 Dizziness and giddiness: Secondary | ICD-10-CM | POA: Diagnosis present

## 2021-02-23 DIAGNOSIS — E860 Dehydration: Secondary | ICD-10-CM | POA: Diagnosis not present

## 2021-02-23 DIAGNOSIS — Z7952 Long term (current) use of systemic steroids: Secondary | ICD-10-CM | POA: Insufficient documentation

## 2021-02-23 DIAGNOSIS — J449 Chronic obstructive pulmonary disease, unspecified: Secondary | ICD-10-CM | POA: Insufficient documentation

## 2021-02-23 DIAGNOSIS — Z79899 Other long term (current) drug therapy: Secondary | ICD-10-CM | POA: Diagnosis not present

## 2021-02-23 DIAGNOSIS — N179 Acute kidney failure, unspecified: Secondary | ICD-10-CM | POA: Diagnosis not present

## 2021-02-23 DIAGNOSIS — W19XXXA Unspecified fall, initial encounter: Secondary | ICD-10-CM | POA: Diagnosis not present

## 2021-02-23 DIAGNOSIS — R531 Weakness: Secondary | ICD-10-CM | POA: Diagnosis not present

## 2021-02-23 DIAGNOSIS — F17210 Nicotine dependence, cigarettes, uncomplicated: Secondary | ICD-10-CM | POA: Diagnosis not present

## 2021-02-23 DIAGNOSIS — R110 Nausea: Secondary | ICD-10-CM | POA: Diagnosis not present

## 2021-02-23 DIAGNOSIS — I10 Essential (primary) hypertension: Secondary | ICD-10-CM | POA: Diagnosis not present

## 2021-02-23 DIAGNOSIS — Z7982 Long term (current) use of aspirin: Secondary | ICD-10-CM | POA: Diagnosis not present

## 2021-02-23 DIAGNOSIS — S199XXA Unspecified injury of neck, initial encounter: Secondary | ICD-10-CM | POA: Insufficient documentation

## 2021-02-23 LAB — URINALYSIS, MICROSCOPIC (REFLEX): Bacteria, UA: NONE SEEN

## 2021-02-23 LAB — BASIC METABOLIC PANEL
Anion gap: 7 (ref 5–15)
BUN: 31 mg/dL — ABNORMAL HIGH (ref 6–20)
CO2: 26 mmol/L (ref 22–32)
Calcium: 9.2 mg/dL (ref 8.9–10.3)
Chloride: 102 mmol/L (ref 98–111)
Creatinine, Ser: 1.52 mg/dL — ABNORMAL HIGH (ref 0.44–1.00)
GFR, Estimated: 40 mL/min — ABNORMAL LOW (ref >60)
Glucose, Bld: 100 mg/dL — ABNORMAL HIGH (ref 70–99)
Potassium: 4.3 mmol/L (ref 3.5–5.1)
Sodium: 135 mmol/L (ref 135–145)

## 2021-02-23 LAB — CBC
HCT: 38.2 % (ref 36.0–46.0)
Hemoglobin: 12.4 g/dL (ref 12.0–15.0)
MCH: 28.1 pg (ref 26.0–34.0)
MCHC: 32.5 g/dL (ref 30.0–36.0)
MCV: 86.6 fL (ref 80.0–100.0)
Platelets: 324 10*3/uL (ref 150–400)
RBC: 4.41 MIL/uL (ref 3.87–5.11)
RDW: 12.9 % (ref 11.5–15.5)
WBC: 5.8 10*3/uL (ref 4.0–10.5)
nRBC: 0 % (ref 0.0–0.2)

## 2021-02-23 LAB — URINALYSIS, ROUTINE W REFLEX MICROSCOPIC
Bilirubin Urine: NEGATIVE
Glucose, UA: NEGATIVE mg/dL
Hgb urine dipstick: NEGATIVE
Ketones, ur: NEGATIVE mg/dL
Nitrite: NEGATIVE
Protein, ur: NEGATIVE mg/dL
Specific Gravity, Urine: 1.01 (ref 1.005–1.030)
pH: 5.5 (ref 5.0–8.0)

## 2021-02-23 LAB — TROPONIN I (HIGH SENSITIVITY): Troponin I (High Sensitivity): 3 ng/L (ref <18)

## 2021-02-23 MED ORDER — LACTATED RINGERS IV BOLUS
1000.0000 mL | Freq: Once | INTRAVENOUS | Status: AC
  Administered 2021-02-23: 21:00:00 1000 mL via INTRAVENOUS

## 2021-02-23 MED ORDER — OXYCODONE HCL 5 MG PO TABS
5.0000 mg | ORAL_TABLET | Freq: Once | ORAL | Status: AC
  Administered 2021-02-23: 21:00:00 5 mg via ORAL
  Filled 2021-02-23: qty 1

## 2021-02-23 MED ORDER — ACETAMINOPHEN 500 MG PO TABS
1000.0000 mg | ORAL_TABLET | Freq: Once | ORAL | Status: AC
  Administered 2021-02-23: 21:00:00 1000 mg via ORAL
  Filled 2021-02-23: qty 2

## 2021-02-23 NOTE — ED Triage Notes (Addendum)
Pt to ED from home c/o fall tonight.  States got out of her car in her driveway to walk across the street and check her mail when she started to feel hot, light headed and had syncopal episode falling forward and hitting face.  Bruise and abrasion noted to right cheek and eye, teeth intact but states loose and painful, and right right knee on the ground.  Pt states this has happened to her before.  Also states she has been having low back pain and left groin pain for months.  Pt A&Ox4, chest rise even and unlabored, in NAD at this time.

## 2021-02-23 NOTE — ED Provider Notes (Signed)
[facility] Emergency Department Provider Note ____________________________________________   Event Date/Time   First MD Initiated Contact with Patient 02/23/21 1927     (approximate)  I have reviewed the triage vital signs and the nursing notes.  HISTORY  Chief Complaint Fall and Dizziness   HPI Sheri Calderon is a 55 y.o. femalewho presents to the ED for evaluation of dizziness and a fall.   Chart review indicates history of HTN, tobacco abuse and COPD.  Paroxysmal atrial fibrillation not on AC.  Left heart cath back in February with normal coronary arteries and LVEDP.  Patient presents to the ED, accompanied by her husband, for evaluation of a fall with possible syncope superimposed on chronic dizziness and nausea.  She reports about 8 years of nausea for which she takes antiemetics every day, since being in remission for colon cancer.  She reports continued nausea this past week and a few episodes of nonbloody nonbilious emesis, which is typical for her.  Denies any abdominal pain, diarrhea, dysuria, chest pain, shortness of breath.   This afternoon around 12 or 1 PM patient reports walking out to her mailbox, and when coming back to the house she began feeling presyncopal lightheaded dizziness, vision narrowing and weakness such that she fell to the ground and "blacked out."  She reports falling forward and striking the right side of her head and face on the ground.  She was able to get back up and has ambulated at her baseline since that time.  She reports some soreness to the right side of her face since then, as well as continued nausea, presyncopal dizziness and generalized weakness.    Past Medical History:  Diagnosis Date   Allergic rhinitis    Anxiety    Atrial fibrillation ([place])    ~ 06/2010 single episdose, evaluted at [facility] with testing. No reccurence. Does not see cardiologist (08/01/2018)   Colon cancer [facility]) 2014   cancer of the  sigmoid colon   Depression    Diverticulitis    "this is my 2nd time in hospital w/this in the last 2 wk" (06/21/2012)   Dysrhythmia    1 time issue with A. Fib. In the hospital for 1 week. Spontaneously convereted to NSR.   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/26/2020   Polysubstance abuse ([place]) 01/24/2020   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, with delusions ([place]) 01/24/2020   Severe manic bipolar 1 disorder with psychotic behavior ([place]) 01/24/2020   Substance induced mood disorder ([place]) 01/24/2020   Sepsis ([place]) 11/07/2018   Incisional hernia 08/03/2018   CAP (community acquired pneumonia) 05/13/2018   Ileitis 07/30/2017   Generalized abdominal pain    Genetic testing 02/14/2016   Personal history of sigmoid colon cancer 10/29/2014   Weight gain 10/29/2014   Cancer of sigmoid colon ([place]) 08/04/2012   Restless legs syndrome     Past Surgical History:  Procedure Laterality Date   APPENDECTOMY     CARDIOVASCULAR STRESS TEST     06/24/10 [facility]): No ischemia, EF 53%.   COLONOSCOPY N/A 08/01/2017   Procedure: COLONOSCOPY;  Surgeon: Lin Landsman, MD;  Location: [facility] ENDOSCOPY;  Service: Gastroenterology;  Laterality: N/A;   INCISIONAL HERNIA REPAIR  08/03/2018   INCISIONAL HERNIA REPAIR N/A 08/03/2018   Procedure: LAPAROSCOPIC INCISIONAL HERNIA REPAIR WITH MESH;  Surgeon: Coralie Keens, MD;  Location: [place];  Service:  General;  Laterality: N/A;   LEFT HEART CATH AND CORONARY ANGIOGRAPHY N/A 04/09/2020   Procedure: LEFT HEART CATH AND CORONARY ANGIOGRAPHY;  Surgeon: Nigel Mormon, MD;  Location: [place] [facility];  Service: Cardiovascular;  Laterality: N/A;   PARTIAL COLECTOMY N/A 06/28/2012   Procedure: PARTIAL COLECTOMY;  Surgeon: Harl Bowie, MD;  Location: [place];  Service: General;  Laterality: N/A;   PORTACATH PLACEMENT N/A 07/11/2012   Procedure: INSERTION PORT-A-CATH;  Surgeon: Harl Bowie, MD;  Location: [facility];  Service: General;  Laterality: N/A;   portacath removal     TRANSTHORACIC ECHOCARDIOGRAM     06/22/10 [facility]): LVEF 55%. Normal LA size. Mild MR/TR. Trace PI.   TUBAL LIGATION  ~ 1989    Prior to Admission medications   Medication Sig Start Date End Date Taking? Authorizing Provider  albuterol (VENTOLIN HFA) 108 (90 Base) MCG/ACT inhaler Inhale 1 puff into the lungs every 6 (six) hours as needed for wheezing or shortness of breath.    [provider]  ALPRAZolam Duanne Moron) 0.5 MG tablet Take 0.5 mg by mouth 3 (three) times daily as needed for anxiety. 07/18/17   [provider]  Aspirin 81 MG CAPS Take 81 mg by mouth daily. 03/06/20   Cantwell, Celeste C, PA-C  Butalbital-APAP-Caffeine 50-300-40 MG CAPS Take 1 tablet by mouth daily.    [provider]  DULoxetine (CYMBALTA) 60 MG capsule Take 1 capsule by mouth daily.    [provider]  gemfibrozil (LOPID) 600 MG tablet Take 1 tablet by mouth daily. 04/07/20   [provider]  lisinopril (ZESTRIL) 40 MG tablet Take 1 tablet (40 mg total) by mouth daily. 03/06/20 03/01/21  Cantwell, Celeste C, PA-C  metoprolol tartrate (LOPRESSOR) 25 MG tablet TAKE 1 TABLET BY MOUTH TWICE A DAY 11/27/20   Cantwell, Celeste C, PA-C  Nirmatrelvir & Ritonavir 20 x 150 MG & 10 x 100MG TBPK TAKE 3 TABLETS BY MOUTH 2 (TWO) TIMES DAILY FOR 5 DAYS. 03/11/20 03/11/21  Eileen Stanford, PA-C  nitroGLYCERIN  (NITROSTAT) 0.4 MG SL tablet Place 1 tablet (0.4 mg total) under the tongue every 5 (five) minutes as needed for chest pain. 03/06/20 06/04/20  Cantwell, Celeste C, PA-C  omeprazole (PRILOSEC) 20 MG capsule Take 20 mg by mouth daily.    [provider]  pregabalin (LYRICA) 150 MG capsule Take 150 mg by mouth 2 (two) times daily.    [provider]  prochlorperazine (COMPAZINE) 10 MG tablet Take 10 mg by mouth every 8 (eight) hours as needed for nausea or vomiting.    [provider]  rizatriptan (MAXALT) 10 MG tablet Take 10 mg by mouth See admin instructions. Take 10 mg by mouth at onset of headache. Repeat in 2 hours if needed. Maximum 2 tablets in 24 hours. 06/10/17   [provider]  rosuvastatin (CRESTOR) 20 MG tablet Take 1 tablet (20 mg total) by mouth daily. 03/06/20 03/01/21  Cantwell, Gerline Legacy, PA-C  Allergies Lorazepam and Gabapentin  Family History  Problem Relation Age of Onset   Diabetes Mother    Heart disease Mother    Stomach cancer Mother    Ovarian cancer Mother 42   COPD Father    Hypertension Father    Hyperlipidemia Father    Colon polyps Father        25 lifetime colon polyps   Down syndrome Paternal Aunt    Cancer Paternal Uncle        cancer in the spine   COPD Maternal Grandmother    Stomach cancer Paternal Grandmother    Vascular Disease Brother    Aneurysm Brother     Social History Social History   Tobacco Use   Smoking status: Every Day    Packs/day: 0.20    Years: 15.00    Pack years: 3.00    Types: Cigarettes    Start date: 01/17/1991   Smokeless tobacco: Never  Vaping Use   Vaping Use: Never used  Substance Use Topics   Alcohol use: No    Alcohol/week: 0.0 standard drinks   Drug use: No    Review of Systems  Constitutional: No fever/chills.  Positive for generalized weakness and presyncopal dizziness. Eyes: No visual changes. ENT: No sore throat. Cardiovascular: Denies chest pain. Respiratory:  Denies shortness of breath. Gastrointestinal: No abdominal pain.   No diarrhea.  No constipation. Marland Kitchen  Positive for chronic nausea and vomiting. Genitourinary: Negative for dysuria. Musculoskeletal: Negative for back pain. Skin: Negative for rash. Neurological: Negative for headaches, focal weakness or numbness. ____________________________________________   PHYSICAL EXAM:  VITAL SIGNS: Vitals:   02/23/21 2130 02/23/21 2200  BP: (!) 134/91 134/85  Pulse: 87 97  Resp: (!) 24 19  Temp:    SpO2: 97% 98%    Constitutional: Alert and oriented. Well appearing and in no acute distress. Eyes: Conjunctivae are normal. PERRL. EOMI. Head: Small superficial abrasion to right lateral forehead and lateral to her right orbit.  No bony step-offs, laceration or open injury.  No signs of EOM entrapment. Nose: No congestion/rhinnorhea. Mouth/Throat: Mucous membranes are dry.  Oropharynx non-erythematous. Neck: No stridor. No cervical spine tenderness to palpation. Cardiovascular: Normal rate, regular rhythm. Good peripheral circulation. Respiratory: Normal respiratory effort.  No retractions. Lungs CTAB. Gastrointestinal: Soft , nondistended, nontender to palpation.  Musculoskeletal: No joint effusions. No signs of acute trauma. Neurologic:  Normal speech and language. No gross focal neurologic deficits are appreciated. No gait instability noted. Cranial nerves II through XII intact 5/5 strength and sensation in all 4 extremities Skin:  Skin is warm, dry and intact. No rash noted. Psychiatric: Mood and affect are normal. Speech and behavior are normal. ____________________________________________   LABS (all labs ordered are listed, but only abnormal results are displayed)  Labs Reviewed  BASIC METABOLIC PANEL - Abnormal; Notable for the following components:      Result Value   Glucose, Bld 100 (*)    BUN 31 (*)    Creatinine, Ser 1.52 (*)    GFR, Estimated 40 (*)    All other components  within normal limits  URINALYSIS, ROUTINE W REFLEX MICROSCOPIC - Abnormal; Notable for the following components:   Leukocytes,Ua SMALL (*)    All other components within normal limits  CBC  URINALYSIS, MICROSCOPIC (REFLEX)  TROPONIN I (HIGH SENSITIVITY)   ____________________________________________  12 Lead EKG  Sinus rhythm with a rate of 77 bpm.  Normal axis and intervals.  No evidence of acute ischemia. ____________________________________________  RADIOLOGY  ED MD interpretation: CT head reviewed by me without evidence of acute intracranial pathology.  Official radiology report(s): CT HEAD WO CONTRAST (5MM)  Result Date: 02/23/2021 CLINICAL DATA:  Fall with right facial trauma and syncope. EXAM: CT HEAD WITHOUT CONTRAST TECHNIQUE: Contiguous axial images were obtained from the base of the skull through the vertex without intravenous contrast. COMPARISON:  11/26/2017 FINDINGS: Brain: The brain shows a normal appearance without evidence of malformation, atrophy, old or acute small or large vessel infarction, mass lesion, hemorrhage, hydrocephalus or extra-axial collection. Vascular: No hyperdense vessel. No evidence of atherosclerotic calcification. Skull: Normal.  No traumatic finding.  No focal bone lesion. Sinuses/Orbits: Sinuses are clear. Orbits appear normal. Mastoids are clear. Other: None significant IMPRESSION: Normal head CT. Electronically Signed   By: Nelson Chimes M.D.   On: 02/23/2021 20:40   CT CERVICAL SPINE WO CONTRAST  Result Date: 02/23/2021 CLINICAL DATA:  Syncopal episode today. Fall with trauma to the head and neck. EXAM: CT CERVICAL SPINE WITHOUT CONTRAST TECHNIQUE: Multidetector CT imaging of the cervical spine was performed without intravenous contrast. Multiplanar CT image reconstructions were also generated. COMPARISON:  None. FINDINGS: Alignment: Straightening and slight kyphotic curvature. No traumatic malalignment. Skull base and vertebrae: No fracture or  focal bone lesion Soft tissues and spinal canal: No traumatic soft tissue finding. Disc levels: The foramen magnum is widely patent. There is ordinary mild osteoarthritis of the C1-2 articulation but no encroachment upon the neural structures. C2-3: Normal C3-4: Spondylosis.  Mild bilateral foraminal narrowing. C4-5: Spondylosis.  Mild right foraminal narrowing. C5-6: Spondylosis.  Moderate bilateral foraminal narrowing. C6-7: Spondylosis.  Mild bilateral foraminal narrowing. C7-T1: Normal interspace. Upper chest: Negative Other: None IMPRESSION: No acute or traumatic finding. Chronic cervical spondylosis. Chronic foraminal narrowing, most pronounced at the C5-6 level. Electronically Signed   By: Nelson Chimes M.D.   On: 02/23/2021 20:42    ____________________________________________   PROCEDURES and INTERVENTIONS  Procedure(s) performed (including Critical Care):  .1-3 Lead EKG Interpretation Performed by: Vladimir Crofts, MD Authorized by: Vladimir Crofts, MD     Interpretation: normal     ECG rate:  76   ECG rate assessment: normal     Rhythm: sinus rhythm     Ectopy: none     Conduction: normal    Medications  lactated ringers bolus 1,000 mL (0 mLs Intravenous Stopped 02/23/21 2214)  acetaminophen (TYLENOL) tablet 1,000 mg (1,000 mg Oral Given 02/23/21 2032)  oxyCODONE (Oxy IR/ROXICODONE) immediate release tablet 5 mg (5 mg Oral Given 02/23/21 2032)    ____________________________________________   MDM / ED COURSE   55 year old female presents to the ED after a fall with possible syncopal episode in the setting of chronic nausea and vomiting, with evidence of mild dehydration and AKI as a likely etiology of a vasovagal syndrome.  Ultimately amenable to outpatient management.  Normal vitals on room air.  Exam is generally reassuring.  No evidence of neurologic or vascular deficits.  Couple abrasions to the right side of her face, but no signs of EOM entrapment or more severe features.   Blood work with what appears to be a prerenal AKI, but otherwise benign.  UA without infectious features.  CT head without evidence of ICH or CVA, and C-spine without evidence of fracture.  Provided fluid resuscitation with improvement of her nausea and dizziness, she is back to baseline and ambulating without difficulty.  We will discharge with return precautions.  Clinical Course as of 02/23/21 2231  Sun Feb 23, 2021  2220 Reassessed. Feeling good and requesting discharge.  [DS]    Clinical Course User Index [DS] Vladimir Crofts, MD    ____________________________________________   FINAL CLINICAL IMPRESSION(S) / ED DIAGNOSES  Final diagnoses:  Dehydration  AKI (acute kidney injury) [facility])     ED Discharge Orders     None        Ottilia Pippenger   Note:  This document was prepared using Dragon voice recognition software and may include unintentional dictation errors.    Vladimir Crofts, MD 02/23/21 [phone]

## 2021-02-23 NOTE — Discharge Instructions (Signed)
OPTIONS FOR DENTAL FOLLOW UP CARE ° °[place] Department of Health and Human Services - Local Safety Net Dental Clinics °http://www.ncdhhs.gov/dph/oralhealth/services/safetynetclinics.htm °  °[facility] (336-562-3123) ° °[facility] [place] ([phone]) ° °[facility] ([phone] ext [phone]) ° °[facility] ([phone]) ° °[facility] ([phone]) °This clinic caters to the indigent population and is on a lottery system. °Location: °[facility], [address], [place] °Clinic Hours: °Wednesdays from 6pm - 9pm, patients seen by a lottery system. °For dates, call or go to www.med.[facility].edu/[facility]/patients/Dental-[facility] °Services: °Cleanings, fillings and simple extractions. °Payment Options: °DENTAL WORK IS FREE OF CHARGE. Bring proof of income or support. °Best way to get seen: °Arrive at 5:15 pm - this is a lottery, NOT first come/first serve, so arriving earlier will not increase your chances of being seen. °  °  °[facility] °[phone] °Select option 1 for emergencies °  °Location: °[facility], [address], [place] °Clinic Hours: °No walk-ins accepted - call the day before to schedule an appointment. °Check in times are 9:30 am and 1:30 pm. °Services: °Simple extractions, temporary fillings, pulpectomy/pulp debridement, uncomplicated abscess drainage. °Payment Options: °PAYMENT IS DUE AT THE TIME OF SERVICE.  Fee is usually $100-200, additional surgical procedures (e.g. abscess drainage) may be extra. °Cash, checks, Visa/MasterCard accepted.  Can file Medicaid if patient is covered for dental - patient should call case worker to check. °No discount for [facility] patients. °Best way to get seen: °MUST call the day before and get onto the schedule. Can usually be seen the next 1-2 days. No walk-ins accepted. °  °  °[facility] °[phone] °   °Location: °[facility], [address], [place] °Clinic Hours: °M, W, Th, F 8am or 1:30pm, Tues 9a or 1:30 - first come/first served. °Services: °Simple extractions, temporary fillings, uncomplicated abscess drainage.  You do not need to be an [place] resident. °Payment Options: °PAYMENT IS DUE AT THE TIME OF SERVICE. °Dental insurance, otherwise sliding scale - bring proof of income or support. °Depending on income and treatment needed, cost is usually $50-200. °Best way to get seen: °Arrive early as it is first come/first served. °  °  °[facility] °[phone] °  °Location: °[address] °Clinic Hours: °Mon-Thu 8a-5p °Services: °Most basic dental services including extractions and fillings. °Payment Options: °PAYMENT IS DUE AT THE TIME OF SERVICE. °Sliding scale, up to 50% off - bring proof if income or support. °Medicaid with dental option accepted. °Best way to get seen: °Call to schedule an appointment, can usually be seen within 2 weeks OR they will try to see walk-ins - show up at 8a or 2p (you may have to wait). °  °  °[facility] °919-245-2435 °[place] RESIDENTS ONLY °  °Location: °[facility], [address], [place], [place] [address] °Clinic Hours: By appointment only. °Monday - Thursday 8am-5pm, Friday 8am-12pm °Services: Cleanings, fillings, extractions. °Payment Options: °PAYMENT IS DUE AT THE TIME OF SERVICE. °Cash, Visa or MasterCard. Sliding scale - $30 minimum per service. °Best way to get seen: °Come in to office, complete packet and make an appointment - need proof of income °or support monies for each household member and proof of [place] residence. °Usually takes about a month to get in. °  °  °[facility] °[phone] °  °Location: °[address]   [place] °Clinic Hours: Walk-in Urgent Care Dental Services are offered Monday-Friday  mornings only. °The numbers of emergencies accepted daily is limited to the number of °providers available. °Maximum 15 - Mondays, Wednesdays & Thursdays °Maximum 10 - Tuesdays & Fridays °Services: °You do not need to be a [place] resident to be seen for a dental emergency. °Emergencies are defined as pain, swelling, abnormal bleeding, or dental trauma. Walkins will receive x-rays if needed. °NOTE: Dental cleaning is not an emergency. °Payment Options: °PAYMENT IS DUE AT THE TIME OF SERVICE. °Minimum co-pay is $40.00 for uninsured patients. °Minimum co-pay is $3.00 for Medicaid with dental coverage. °Dental Insurance is accepted and must be presented at time of visit. °Medicare does not cover dental. °Forms of payment: Cash, credit card, checks. °Best way to get seen: °If not previously registered with the clinic, walk-in dental registration begins at 7:15 am and is on a first come/first serve basis. °If previously registered with the clinic, call to make an appointment. °  °  °[facility] °[phone] °[place] RESIDENTS ONLY °  °Location: °[address], [place], [place] °Clinic Hours: °Mon-Thu 10a-2p °Services: Extractions only! °Payment Options: °FREE (donations accepted) - bring proof of income or support °Best way to get seen: °Call and schedule an appointment OR come at 8am on the 1st Monday of every month (except for holidays) when it is first come/first served. °  °  °[facility] °[phone] °  °Location: °[address], [place] °Clinic Hours: °Friday mornings °Services, Payment Options, Best way to get seen: °Call for info °

## 2021-03-13 ENCOUNTER — Other Ambulatory Visit: Payer: Self-pay | Admitting: Student

## 2021-03-13 DIAGNOSIS — I10 Essential (primary) hypertension: Secondary | ICD-10-CM

## 2021-03-14 ENCOUNTER — Other Ambulatory Visit: Payer: Self-pay | Admitting: Student

## 2021-03-14 DIAGNOSIS — I10 Essential (primary) hypertension: Secondary | ICD-10-CM

## 2021-05-25 ENCOUNTER — Other Ambulatory Visit: Payer: Self-pay | Admitting: Cardiology

## 2021-05-25 DIAGNOSIS — I10 Essential (primary) hypertension: Secondary | ICD-10-CM

## 2021-06-07 ENCOUNTER — Other Ambulatory Visit: Payer: Self-pay | Admitting: Cardiology

## 2021-06-07 DIAGNOSIS — I10 Essential (primary) hypertension: Secondary | ICD-10-CM

## 2021-07-01 ENCOUNTER — Other Ambulatory Visit: Payer: Self-pay | Admitting: Student

## 2021-07-01 DIAGNOSIS — I10 Essential (primary) hypertension: Secondary | ICD-10-CM

## 2021-07-01 DIAGNOSIS — I480 Paroxysmal atrial fibrillation: Secondary | ICD-10-CM

## 2021-07-15 ENCOUNTER — Other Ambulatory Visit: Payer: Self-pay | Admitting: Student

## 2021-07-15 DIAGNOSIS — I480 Paroxysmal atrial fibrillation: Secondary | ICD-10-CM

## 2021-07-15 DIAGNOSIS — I10 Essential (primary) hypertension: Secondary | ICD-10-CM

## 2022-04-17 ENCOUNTER — Other Ambulatory Visit: Payer: Self-pay

## 2022-04-17 DIAGNOSIS — I10 Essential (primary) hypertension: Secondary | ICD-10-CM

## 2022-04-17 DIAGNOSIS — I480 Paroxysmal atrial fibrillation: Secondary | ICD-10-CM

## 2022-04-17 MED ORDER — METOPROLOL TARTRATE 25 MG PO TABS: 25.0000 mg | ORAL_TABLET | Freq: Two times a day (BID) | ORAL | 0 refills | Status: DC

## 2022-05-15 ENCOUNTER — Other Ambulatory Visit: Payer: Self-pay | Admitting: Cardiology

## 2022-05-15 DIAGNOSIS — I480 Paroxysmal atrial fibrillation: Secondary | ICD-10-CM

## 2022-05-15 DIAGNOSIS — I10 Essential (primary) hypertension: Secondary | ICD-10-CM

## 2022-05-17 ENCOUNTER — Other Ambulatory Visit: Payer: Self-pay | Admitting: Cardiology

## 2022-05-17 DIAGNOSIS — I480 Paroxysmal atrial fibrillation: Secondary | ICD-10-CM

## 2022-05-17 DIAGNOSIS — I10 Essential (primary) hypertension: Secondary | ICD-10-CM

## 2022-06-18 ENCOUNTER — Other Ambulatory Visit: Payer: Self-pay | Admitting: Cardiology

## 2022-06-18 DIAGNOSIS — I10 Essential (primary) hypertension: Secondary | ICD-10-CM

## 2022-06-18 DIAGNOSIS — I480 Paroxysmal atrial fibrillation: Secondary | ICD-10-CM

## 2022-07-07 ENCOUNTER — Other Ambulatory Visit: Payer: Self-pay | Admitting: Cardiology

## 2022-07-07 DIAGNOSIS — I480 Paroxysmal atrial fibrillation: Secondary | ICD-10-CM

## 2022-07-07 DIAGNOSIS — I10 Essential (primary) hypertension: Secondary | ICD-10-CM

## 2022-08-28 ENCOUNTER — Other Ambulatory Visit: Payer: Self-pay

## 2022-08-28 ENCOUNTER — Emergency Department: Payer: Commercial Managed Care - PPO

## 2022-08-28 ENCOUNTER — Inpatient Hospital Stay
Admission: EM | Admit: 2022-08-28 | Discharge: 2022-08-31 | DRG: 175 | Disposition: A | Discharge status: Home / Self Care | Payer: Commercial Managed Care - PPO | Attending: Internal Medicine | Admitting: Internal Medicine

## 2022-08-28 DIAGNOSIS — I2699 Other pulmonary embolism without acute cor pulmonale: Principal | ICD-10-CM | POA: Diagnosis present

## 2022-08-28 DIAGNOSIS — E669 Obesity, unspecified: Secondary | ICD-10-CM | POA: Diagnosis present

## 2022-08-28 DIAGNOSIS — R7989 Other specified abnormal findings of blood chemistry: Secondary | ICD-10-CM

## 2022-08-28 DIAGNOSIS — R140 Abdominal distension (gaseous): Secondary | ICD-10-CM | POA: Diagnosis not present

## 2022-08-28 DIAGNOSIS — F1210 Cannabis abuse, uncomplicated: Secondary | ICD-10-CM | POA: Diagnosis present

## 2022-08-28 DIAGNOSIS — I480 Paroxysmal atrial fibrillation: Secondary | ICD-10-CM | POA: Diagnosis present

## 2022-08-28 DIAGNOSIS — R031 Nonspecific low blood-pressure reading: Secondary | ICD-10-CM | POA: Diagnosis present

## 2022-08-28 DIAGNOSIS — D6489 Other specified anemias: Secondary | ICD-10-CM | POA: Diagnosis present

## 2022-08-28 DIAGNOSIS — J189 Pneumonia, unspecified organism: Secondary | ICD-10-CM

## 2022-08-28 DIAGNOSIS — E875 Hyperkalemia: Secondary | ICD-10-CM | POA: Diagnosis present

## 2022-08-28 DIAGNOSIS — F312 Bipolar disorder, current episode manic severe with psychotic features: Secondary | ICD-10-CM | POA: Diagnosis present

## 2022-08-28 DIAGNOSIS — Z79899 Other long term (current) drug therapy: Secondary | ICD-10-CM

## 2022-08-28 DIAGNOSIS — C187 Malignant neoplasm of sigmoid colon: Secondary | ICD-10-CM | POA: Diagnosis present

## 2022-08-28 DIAGNOSIS — Z833 Family history of diabetes mellitus: Secondary | ICD-10-CM

## 2022-08-28 DIAGNOSIS — Z7982 Long term (current) use of aspirin: Secondary | ICD-10-CM

## 2022-08-28 DIAGNOSIS — E871 Hypo-osmolality and hyponatremia: Secondary | ICD-10-CM | POA: Diagnosis present

## 2022-08-28 DIAGNOSIS — G2581 Restless legs syndrome: Secondary | ICD-10-CM | POA: Diagnosis present

## 2022-08-28 DIAGNOSIS — Z7901 Long term (current) use of anticoagulants: Secondary | ICD-10-CM | POA: Diagnosis not present

## 2022-08-28 DIAGNOSIS — I10 Essential (primary) hypertension: Secondary | ICD-10-CM

## 2022-08-28 DIAGNOSIS — F1910 Other psychoactive substance abuse, uncomplicated: Secondary | ICD-10-CM | POA: Diagnosis present

## 2022-08-28 DIAGNOSIS — F17210 Nicotine dependence, cigarettes, uncomplicated: Secondary | ICD-10-CM | POA: Diagnosis present

## 2022-08-28 DIAGNOSIS — Z9221 Personal history of antineoplastic chemotherapy: Secondary | ICD-10-CM

## 2022-08-28 DIAGNOSIS — F1510 Other stimulant abuse, uncomplicated: Secondary | ICD-10-CM | POA: Diagnosis present

## 2022-08-28 DIAGNOSIS — F4310 Post-traumatic stress disorder, unspecified: Secondary | ICD-10-CM | POA: Diagnosis present

## 2022-08-28 DIAGNOSIS — I129 Hypertensive chronic kidney disease with stage 1 through stage 4 chronic kidney disease, or unspecified chronic kidney disease: Secondary | ICD-10-CM | POA: Diagnosis present

## 2022-08-28 DIAGNOSIS — E860 Dehydration: Secondary | ICD-10-CM | POA: Diagnosis present

## 2022-08-28 DIAGNOSIS — G250 Essential tremor: Secondary | ICD-10-CM | POA: Diagnosis present

## 2022-08-28 DIAGNOSIS — E785 Hyperlipidemia, unspecified: Secondary | ICD-10-CM | POA: Diagnosis present

## 2022-08-28 DIAGNOSIS — J9601 Acute respiratory failure with hypoxia: Secondary | ICD-10-CM | POA: Diagnosis present

## 2022-08-28 DIAGNOSIS — K529 Noninfective gastroenteritis and colitis, unspecified: Secondary | ICD-10-CM | POA: Diagnosis present

## 2022-08-28 DIAGNOSIS — Z888 Allergy status to other drugs, medicaments and biological substances status: Secondary | ICD-10-CM

## 2022-08-28 DIAGNOSIS — Z8041 Family history of malignant neoplasm of ovary: Secondary | ICD-10-CM

## 2022-08-28 DIAGNOSIS — J440 Chronic obstructive pulmonary disease with acute lower respiratory infection: Secondary | ICD-10-CM | POA: Diagnosis present

## 2022-08-28 DIAGNOSIS — N1831 Chronic kidney disease, stage 3a: Secondary | ICD-10-CM | POA: Diagnosis present

## 2022-08-28 DIAGNOSIS — Z85038 Personal history of other malignant neoplasm of large intestine: Secondary | ICD-10-CM

## 2022-08-28 DIAGNOSIS — F1410 Cocaine abuse, uncomplicated: Secondary | ICD-10-CM | POA: Diagnosis present

## 2022-08-28 DIAGNOSIS — Z800 Family history of malignant neoplasm of digestive organs: Secondary | ICD-10-CM

## 2022-08-28 DIAGNOSIS — Z8249 Family history of ischemic heart disease and other diseases of the circulatory system: Secondary | ICD-10-CM

## 2022-08-28 DIAGNOSIS — R188 Other ascites: Secondary | ICD-10-CM | POA: Diagnosis present

## 2022-08-28 DIAGNOSIS — N170 Acute kidney failure with tubular necrosis: Secondary | ICD-10-CM | POA: Diagnosis present

## 2022-08-28 DIAGNOSIS — Z83438 Family history of other disorder of lipoprotein metabolism and other lipidemia: Secondary | ICD-10-CM

## 2022-08-28 DIAGNOSIS — Z9049 Acquired absence of other specified parts of digestive tract: Secondary | ICD-10-CM

## 2022-08-28 DIAGNOSIS — N179 Acute kidney failure, unspecified: Secondary | ICD-10-CM

## 2022-08-28 DIAGNOSIS — Z8279 Family history of other congenital malformations, deformations and chromosomal abnormalities: Secondary | ICD-10-CM

## 2022-08-28 DIAGNOSIS — M545 Low back pain, unspecified: Secondary | ICD-10-CM | POA: Diagnosis present

## 2022-08-28 DIAGNOSIS — Z83719 Family history of colon polyps, unspecified: Secondary | ICD-10-CM

## 2022-08-28 DIAGNOSIS — Z825 Family history of asthma and other chronic lower respiratory diseases: Secondary | ICD-10-CM

## 2022-08-28 LAB — BASIC METABOLIC PANEL
Anion gap: 11 (ref 5–15)
BUN: 46 mg/dL — ABNORMAL HIGH (ref 6–20)
CO2: 22 mmol/L (ref 22–32)
Calcium: 8.3 mg/dL — ABNORMAL LOW (ref 8.9–10.3)
Chloride: 100 mmol/L (ref 98–111)
Creatinine, Ser: 4.07 mg/dL — ABNORMAL HIGH (ref 0.44–1.00)
GFR, Estimated: 12 mL/min — ABNORMAL LOW (ref >60)
Glucose, Bld: 118 mg/dL — ABNORMAL HIGH (ref 70–99)
Potassium: 4.6 mmol/L (ref 3.5–5.1)
Sodium: 133 mmol/L — ABNORMAL LOW (ref 135–145)

## 2022-08-28 LAB — HEPATIC FUNCTION PANEL
ALT: 27 U/L (ref 0–44)
AST: 29 U/L (ref 15–41)
Albumin: 3.1 g/dL — ABNORMAL LOW (ref 3.5–5.0)
Alkaline Phosphatase: 40 U/L (ref 38–126)
Bilirubin, Direct: 0.4 mg/dL — ABNORMAL HIGH (ref 0.0–0.2)
Indirect Bilirubin: 1.6 mg/dL — ABNORMAL HIGH (ref 0.3–0.9)
Total Bilirubin: 2 mg/dL — ABNORMAL HIGH (ref 0.3–1.2)
Total Protein: 6.4 g/dL — ABNORMAL LOW (ref 6.5–8.1)

## 2022-08-28 LAB — LACTIC ACID, PLASMA: Lactic Acid, Venous: 0.8 mmol/L (ref 0.5–1.9)

## 2022-08-28 LAB — CBC
HCT: 35.5 % — ABNORMAL LOW (ref 36.0–46.0)
Hemoglobin: 11.2 g/dL — ABNORMAL LOW (ref 12.0–15.0)
MCH: 28.1 pg (ref 26.0–34.0)
MCHC: 31.5 g/dL (ref 30.0–36.0)
MCV: 89 fL (ref 80.0–100.0)
Platelets: 344 10*3/uL (ref 150–400)
RBC: 3.99 MIL/uL (ref 3.87–5.11)
RDW: 15.1 % (ref 11.5–15.5)
WBC: 13.2 10*3/uL — ABNORMAL HIGH (ref 4.0–10.5)
nRBC: 0 % (ref 0.0–0.2)

## 2022-08-28 LAB — TROPONIN I (HIGH SENSITIVITY)
Troponin I (High Sensitivity): 4 ng/L (ref <18)
Troponin I (High Sensitivity): 3 ng/L (ref <18)

## 2022-08-28 LAB — PROCALCITONIN: Procalcitonin: 0.12 ng/mL

## 2022-08-28 LAB — D-DIMER, QUANTITATIVE: D-Dimer, Quant: 0.85 ug/mL-FEU — ABNORMAL HIGH (ref 0.00–0.50)

## 2022-08-28 LAB — LIPASE, BLOOD: Lipase: 29 U/L (ref 11–51)

## 2022-08-28 MED ORDER — HEPARIN BOLUS VIA INFUSION
5000.0000 [IU] | Freq: Once | INTRAVENOUS | Status: AC
  Administered 2022-08-28: 5000 [IU] via INTRAVENOUS
  Filled 2022-08-28: qty 5000

## 2022-08-28 MED ORDER — LACTATED RINGERS IV BOLUS
1000.0000 mL | Freq: Once | INTRAVENOUS | Status: AC
  Administered 2022-08-28: 1000 mL via INTRAVENOUS

## 2022-08-28 MED ORDER — ONDANSETRON HCL 4 MG/2ML IJ SOLN
4.0000 mg | Freq: Once | INTRAMUSCULAR | Status: AC
  Administered 2022-08-28: 4 mg via INTRAVENOUS
  Filled 2022-08-28: qty 2

## 2022-08-28 MED ORDER — TRAMADOL HCL 50 MG PO TABS
50.0000 mg | ORAL_TABLET | Freq: Two times a day (BID) | ORAL | Status: DC | PRN
  Filled 2022-08-28: qty 1

## 2022-08-28 MED ORDER — LACTATED RINGERS IV SOLN: INTRAVENOUS | Status: DC

## 2022-08-28 MED ORDER — ALBUTEROL SULFATE (2.5 MG/3ML) 0.083% IN NEBU: 3.0000 mL | INHALATION_SOLUTION | RESPIRATORY_TRACT | Status: DC | PRN

## 2022-08-28 MED ORDER — MORPHINE SULFATE (PF) 4 MG/ML IV SOLN
4.0000 mg | Freq: Once | INTRAVENOUS | Status: AC
  Administered 2022-08-28: 4 mg via INTRAVENOUS
  Filled 2022-08-28: qty 1

## 2022-08-28 MED ORDER — ROSUVASTATIN CALCIUM 10 MG PO TABS
20.0000 mg | ORAL_TABLET | Freq: Every day | ORAL | Status: DC
  Administered 2022-08-29 – 2022-08-31 (×3): 20 mg via ORAL
  Filled 2022-08-28 (×3): qty 2

## 2022-08-28 MED ORDER — HEPARIN SODIUM (PORCINE) 5000 UNIT/ML IJ SOLN: 5000.0000 [IU] | Freq: Three times a day (TID) | INTRAMUSCULAR | Status: DC

## 2022-08-28 MED ORDER — SODIUM CHLORIDE 0.9 % IV SOLN
500.0000 mg | Freq: Once | INTRAVENOUS | Status: AC
  Administered 2022-08-28: 500 mg via INTRAVENOUS
  Filled 2022-08-28: qty 5

## 2022-08-28 MED ORDER — OXYCODONE-ACETAMINOPHEN 5-325 MG PO TABS
1.0000 | ORAL_TABLET | ORAL | Status: DC | PRN
  Administered 2022-08-28 – 2022-08-31 (×12): 1 via ORAL
  Filled 2022-08-28 (×12): qty 1

## 2022-08-28 MED ORDER — HEPARIN (PORCINE) 25000 UT/250ML-% IV SOLN
1650.0000 [IU]/h | INTRAVENOUS | Status: DC
  Administered 2022-08-28 – 2022-08-29 (×2): 1500 [IU]/h via INTRAVENOUS
  Administered 2022-08-30: 1650 [IU]/h via INTRAVENOUS
  Filled 2022-08-28 (×3): qty 250

## 2022-08-28 MED ORDER — TECHNETIUM TO 99M ALBUMIN AGGREGATED
4.3700 | Freq: Once | INTRAVENOUS | Status: AC | PRN
  Administered 2022-08-28: 4.37 via INTRAVENOUS

## 2022-08-28 MED ORDER — DULOXETINE HCL 30 MG PO CPEP
60.0000 mg | ORAL_CAPSULE | Freq: Every day | ORAL | Status: DC
  Administered 2022-08-29 – 2022-08-31 (×3): 60 mg via ORAL
  Filled 2022-08-28 (×3): qty 2

## 2022-08-28 MED ORDER — ALPRAZOLAM 0.5 MG PO TABS
0.5000 mg | ORAL_TABLET | Freq: Three times a day (TID) | ORAL | Status: DC | PRN
  Administered 2022-08-29 – 2022-08-31 (×5): 0.5 mg via ORAL
  Filled 2022-08-28 (×6): qty 1

## 2022-08-28 MED ORDER — SODIUM CHLORIDE 0.9 % IV SOLN
1.0000 g | Freq: Once | INTRAVENOUS | Status: AC
  Administered 2022-08-28: 1 g via INTRAVENOUS
  Filled 2022-08-28: qty 10

## 2022-08-28 MED ORDER — SODIUM CHLORIDE 0.9 % IV SOLN: INTRAVENOUS | Status: DC

## 2022-08-28 MED ORDER — METOPROLOL TARTRATE 25 MG PO TABS
25.0000 mg | ORAL_TABLET | Freq: Two times a day (BID) | ORAL | Status: DC
  Administered 2022-08-28 – 2022-08-31 (×6): 25 mg via ORAL
  Filled 2022-08-28 (×6): qty 1

## 2022-08-28 MED ORDER — LACTATED RINGERS IV BOLUS
1000.0000 mL | Freq: Once | INTRAVENOUS | Status: AC
Start: 2022-08-28 — End: 2022-08-28
  Administered 2022-08-28: 1000 mL via INTRAVENOUS

## 2022-08-28 NOTE — ED Triage Notes (Addendum)
Pt comes with c/o cp that started week ago and has now since gotten worse. Pt states tightness in chest and down her back and into hip.   Pt unable to sit still. Pt appears very uncomfortable EKG: performed and showed HR in 130s.

## 2022-08-28 NOTE — H&P (Signed)
History and Physical    Patient: Sheri Calderon:[id number] DOB: 08-Jan-1966 DOA: 08/28/2022 DOS: the patient was seen and examined on 08/28/2022 PCP: Marletta Lor, NP  Patient coming from: Home  Chief Complaint:  Chief Complaint  Patient presents with   Chest Pain   HPI: Sheri Calderon is a 57 y.o. female with medical history significant of essential hypertension, single episode atrial fibrillation 2012, colon cancer status post bowel resection, radiation and chemotherapy 10 years ago, depression, PTSD, depression, bipolar disorder, who present to the hospital with acute renal failure.  Symptoms started about 2 weeks ago, when she started having nausea vomiting diarrhea.  She had multiple loose stools every day, but she was able to keep liquid down.  She has lost about 30 pounds of body weight in 2 weeks.  Denies any fever or chills. Over the last week, she also had a decreased urine output, she started have abdominal distention.  She also complaining cramping and bloating pain in her stomach.  She also complaining of chest pain, but points to upper stomach.  She has some short of breath intermittently.  No paroxysmal nocturnal dyspnea or orthopnea.  In the emergency room, she is afebrile, heart rate 97, her blood pressure initially dropped down to 86/63, received 1 L bolus, blood pressure is better. Lab test showed sodium 133, CO2 22, creatinine 4.07 (1.5 in 01/2021). Procalcitonin level 0.12, bilirubin 2.0.  Troponin 4.0.  White cell 13.2, hemoglobin 11.2.  CT without contrast showed: 1. Focal ground-glass opacification over the posterior right upper lobe which may be due to atypical infectious or inflammatory process. Consider follow-up noncontrast chest CT 4-6 weeks. 2. Scattered areas of linear density over the lungs bilaterally likely atelectasis/scarring.  In the emergency room, patient was given 1 L fluid bolus, she also received a dose of Rocephin and a Zithromax.  Review of  Systems: As mentioned in the history of present illness. All other systems reviewed and are negative. Past Medical History:  Diagnosis Date   Allergic rhinitis    Anxiety    Atrial fibrillation (HCC)    ~ 06/2010 single episdose, evaluted at [facility] with testing. No reccurence. Does not see cardiologist (08/01/2018)   Colon cancer [facility]) 2014   cancer of the sigmoid colon   Depression    Diverticulitis    "this is my 2nd time in hospital w/this in the last 2 wk" (06/21/2012)   Dysrhythmia    1 time issue with A. Fib. In the hospital for 1 week. Spontaneously convereted to NSR.   GERD (gastroesophageal reflux disease)    Gout    Hypertension    Lower extremity edema    Migraines    Osteoarthritis    Peripheral neuropathy    PTSD (post-traumatic stress disorder)    Restless legs syndrome    S/P chemotherapy, time since less than 4 weeks    Tremor, essential    Past Surgical History:  Procedure Laterality Date   APPENDECTOMY     CARDIOVASCULAR STRESS TEST     06/24/10 [facility]): No ischemia, EF 53%.   COLONOSCOPY N/A 08/01/2017   Procedure: COLONOSCOPY;  Surgeon: Toney Reil, MD;  Location: [facility] ENDOSCOPY;  Service: Gastroenterology;  Laterality: N/A;   INCISIONAL HERNIA REPAIR  08/03/2018   INCISIONAL HERNIA REPAIR N/A 08/03/2018   Procedure: LAPAROSCOPIC INCISIONAL HERNIA REPAIR WITH MESH;  Surgeon: Abigail Miyamoto, MD;  Location: [facility] OR;  Service: General;  Laterality: N/A;   LEFT HEART CATH AND CORONARY  ANGIOGRAPHY N/A 04/09/2020   Procedure: LEFT HEART CATH AND CORONARY ANGIOGRAPHY;  Surgeon: Elder Negus, MD;  Location: [facility] INVASIVE CV LAB;  Service: Cardiovascular;  Laterality: N/A;   PARTIAL COLECTOMY N/A 06/28/2012   Procedure: PARTIAL COLECTOMY;  Surgeon: Shelly Rubenstein, MD;  Location: [facility] OR;  Service: General;  Laterality: N/A;   PORTACATH PLACEMENT N/A 07/11/2012   Procedure: INSERTION PORT-A-CATH;  Surgeon: Shelly Rubenstein, MD;  Location:  [facility] ORS;  Service: General;  Laterality: N/A;   portacath removal     TRANSTHORACIC ECHOCARDIOGRAM     06/22/10 [facility]): LVEF 55%. Normal LA size. Mild MR/TR. Trace PI.   TUBAL LIGATION  ~ 1989   Social History:  reports that she has been smoking cigarettes. She started smoking about 31 years ago. She has a 3.00 pack-year smoking history. She has never used smokeless tobacco. She reports that she does not drink alcohol and does not use drugs.  Allergies  Allergen Reactions   Lorazepam Hives and Swelling    Patient reports receiving lorazepam intensol in the hospital and experienced swelling with hives.   Gabapentin Rash    Family History  Problem Relation Age of Onset   Diabetes Mother    Heart disease Mother    Stomach cancer Mother    Ovarian cancer Mother 4   COPD Father    Hypertension Father    Hyperlipidemia Father    Colon polyps Father        69 lifetime colon polyps   Down syndrome Paternal Aunt    Cancer Paternal Uncle        cancer in the spine   COPD Maternal Grandmother    Stomach cancer Paternal Grandmother    Vascular Disease Brother    Aneurysm Brother     Prior to Admission medications   Medication Sig Start Date End Date Taking? Authorizing Provider  albuterol (VENTOLIN HFA) 108 (90 Base) MCG/ACT inhaler Inhale 1 puff into the lungs every 6 (six) hours as needed for wheezing or shortness of breath.   Yes [provider]  ALPRAZolam Prudy Feeler) 0.5 MG tablet Take 0.5 mg by mouth 3 (three) times daily as needed for anxiety. 07/18/17  Yes [provider]  amLODipine (NORVASC) 10 MG tablet Take 10 mg by mouth daily. 07/09/22  Yes [provider]  benzonatate (TESSALON) 100 MG capsule Take 100 mg by mouth 2 (two) times daily. 07/07/22  Yes [provider]  Butalbital-APAP-Caffeine 50-300-40 MG CAPS Take 1 tablet by mouth daily.   Yes [provider]  cyclobenzaprine (FLEXERIL) 10 MG tablet Take 10 mg by mouth 3  (three) times daily as needed. 07/16/22  Yes [provider]  DULoxetine (CYMBALTA) 60 MG capsule Take 1 capsule by mouth daily.   Yes [provider]  lisinopril (ZESTRIL) 40 MG tablet TAKE 1 TABLET BY MOUTH EVERY DAY 06/09/21  Yes Patwardhan, Manish J, MD  metoprolol tartrate (LOPRESSOR) 25 MG tablet TAKE 1 TABLET BY MOUTH TWICE A DAY 07/08/22  Yes Patwardhan, Manish J, MD  nitroGLYCERIN (NITROSTAT) 0.4 MG SL tablet Place 1 tablet (0.4 mg total) under the tongue every 5 (five) minutes as needed for chest pain. 03/06/20 08/28/22 Yes Cantwell, Celeste C, PA-C  omeprazole (PRILOSEC) 20 MG capsule Take 20 mg by mouth daily.   Yes [provider]  ondansetron (ZOFRAN) 8 MG tablet Take 8 mg by mouth every 8 (eight) hours as needed. 08/04/22  Yes [provider]  pregabalin (LYRICA) 150 MG  capsule Take 150 mg by mouth 2 (two) times daily.   Yes [provider]  prochlorperazine (COMPAZINE) 10 MG tablet Take 10 mg by mouth every 8 (eight) hours as needed for nausea or vomiting.   Yes [provider]  rizatriptan (MAXALT) 10 MG tablet Take 10 mg by mouth See admin instructions. Take 10 mg by mouth at onset of headache. Repeat in 2 hours if needed. Maximum 2 tablets in 24 hours. 06/10/17  Yes [provider]  rosuvastatin (CRESTOR) 20 MG tablet Take 1 tablet (20 mg total) by mouth daily. 03/06/20 08/28/22 Yes Cantwell, Celeste C, PA-C  traMADol (ULTRAM) 50 MG tablet Take 50 mg by mouth 2 (two) times daily as needed. 07/10/22  Yes [provider]  Aspirin 81 MG CAPS Take 81 mg by mouth daily. Patient not taking: Reported on 08/28/2022 03/06/20   Elvin So C, PA-C  ezetimibe (ZETIA) 10 MG tablet Take 10 mg by mouth daily. Patient not taking: Reported on 08/28/2022 08/04/22   [provider]  gemfibrozil (LOPID) 600 MG tablet Take 1 tablet by mouth daily. Patient not taking: Reported on 08/28/2022 04/07/20   [provider]     Physical Exam: Vitals:   08/28/22 1330 08/28/22 1435 08/28/22 1439 08/28/22 1535  BP: 104/68 91/60 (!) 86/63 99/73  Pulse: 97 78 81 81  Resp: 12 (!) 25 (!) 23 19  Temp:      SpO2: 95% 96% 96% 97%   Physical Exam Constitutional:      General: She is not in acute distress.    Appearance: She is obese. She is ill-appearing. She is not toxic-appearing.  HENT:     Head: Normocephalic and atraumatic.  Eyes:     Extraocular Movements: Extraocular movements intact.     Pupils: Pupils are equal, round, and reactive to light.  Neck:     Thyroid: No thyromegaly.     Vascular: No hepatojugular reflux or JVD.  Cardiovascular:     Rate and Rhythm: Normal rate and regular rhythm.     Heart sounds: Normal heart sounds. No murmur heard. Pulmonary:     Effort: No tachypnea or respiratory distress.     Comments: Decreased breath sounds bilaterally. Abdominal:     General: Bowel sounds are normal.     Palpations: Abdomen is soft.     Comments: There is significant abdominal distention, no shifting dullness.  Some tenderness in the lower abdomen.  Musculoskeletal:        General: Normal range of motion.     Cervical back: Normal range of motion and neck supple.     Right lower leg: No edema.     Left lower leg: No edema.  Skin:    General: Skin is warm and dry.  Neurological:     General: No focal deficit present.     Mental Status: She is alert and oriented to person, place, and time.     Cranial Nerves: No cranial nerve deficit.  Psychiatric:        Mood and Affect: Mood is anxious.        Behavior: Behavior is not agitated.     Data Reviewed:  Results reviewed as above.  Assessment and Plan: Acute renal failure superimposed on chronic kidney disease stage IIIa. Hyponatremia. Acute gastroenteritis. Patient has chronic kidney disease stage IIIa 2 years ago, there is no lab after that.  Patient also had a significant nausea vomiting diarrhea, could have significant  dehydration. Will obtain renal  ultrasound.  I will also place the Foley catheter and monitor urine output for now. Continue IV fluids with normal saline.  Nephrology will be consulted.  History of colon cancer. Abdominal distention. Patient also had significant abdominal distention, will obtain abdominal ultrasound to evaluate for possible metastasis and ascites.  Likely COPD. Tobacco abuse. Patient has a long history of smoking, has seen a decreased breathing sounds, with occasional shortness of breath.  Most likely COPD. Add as needed albuterol. Advised to quit smoking. Patient chest CT scan showed atypical infiltrates, procalcitonin level 0.12, even with acute renal failure.  Not consistent with bacterial pneumonia.  Will hold off antibiotics for now, pending blood culture results. Patient also getting a VQ scan, will follow-up with results, but suspicion for PE is not very high.  Continue prophylactic heparin.  Essential hypertension, dyslipidemia. Continue home medicines, hold off ACE inhibitor.  Bipolar disorder. PTSD. Depression. Continue some home medicines.   Advance Care Planning:   Code Status: Full Code have discussion with patient, she wishes to be full code.  Consults: Nephrology  Family Communication: None  Severity of Illness: The appropriate patient status for this patient is INPATIENT. Inpatient status is judged to be reasonable and necessary in order to provide the required intensity of service to ensure the patient's safety. The patient's presenting symptoms, physical exam findings, and initial radiographic and laboratory data in the context of their chronic comorbidities is felt to place them at high risk for further clinical deterioration. Furthermore, it is not anticipated that the patient will be medically stable for discharge from the hospital within 2 midnights of admission.   * I certify that at the point of admission it is my clinical judgment that the  patient will require inpatient hospital care spanning beyond 2 midnights from the point of admission due to high intensity of service, high risk for further deterioration and high frequency of surveillance required.*  Author: Marrion Coy, MD 08/28/2022 4:41 PM  For on call review [email or web address].

## 2022-08-28 NOTE — ED Provider Notes (Signed)
[facility] Provider Note    Event Date/Time   First MD Initiated Contact with Patient 08/28/22 1331     (approximate)   History   Chief Complaint Chest Pain   HPI  Sheri Calderon is a 57 y.o. female with past medical history of hypertension, atrial fibrillation, colon cancer, bipolar disorder, and polysubstance abuse who presents to the ED complaining of chest pain.  Patient reports that she has been dealing with pain both in her chest and abdomen for about the past month.  She describes it as a sharp pain that is worse when a deep breath, extending from her chest all the way down into the bilateral lower quadrants of her abdomen.  She states that the pain also moves into her upper and lower back, seems to be worse with certain changes in positions.  She has not had any fevers or cough, does endorse some difficulty breathing.  She describes the pain as reminiscent to when she was diagnosed with colon cancer, has since had partial colectomy and completed chemotherapy.     Physical Exam   Triage Vital Signs: ED Triage Vitals  Enc Vitals Group     BP 08/28/22 1325 94/60     Pulse Rate 08/28/22 1324 (!) 106     Resp 08/28/22 1324 20     Temp 08/28/22 1324 98 F (36.7 C)     Temp src --      SpO2 08/28/22 1324 95 %     Weight --      Height --      Head Circumference --      Peak Flow --      Pain Score 08/28/22 1323 10     Pain Loc --      Pain Edu? --      Excl. in GC? --     Most recent vital signs: Vitals:   08/28/22 1439 08/28/22 1535  BP: (!) 86/63 99/73  Pulse: 81 81  Resp: (!) 23 19  Temp:    SpO2: 96% 97%    Constitutional: Alert and oriented. Eyes: Conjunctivae are normal. Head: Atraumatic. Nose: No congestion/rhinnorhea. Mouth/Throat: Mucous membranes are moist.  Cardiovascular: Tachycardic, regular rhythm. Grossly normal heart sounds.  2+ radial pulses bilaterally. Respiratory: Normal respiratory effort.  No retractions. Lungs  CTAB.  No chest wall tenderness to palpation. Gastrointestinal: Soft and n diffusely tender to palpation with no rebound or guarding. No distention. Musculoskeletal: No lower extremity tenderness nor edema.  Midline thoracic and lumbar spinal tenderness to palpation noted. Neurologic:  Normal speech and language. No gross focal neurologic deficits are appreciated.    ED Results / Procedures / Treatments   Labs (all labs ordered are listed, but only abnormal results are displayed) Labs Reviewed  BASIC METABOLIC PANEL - Abnormal; Notable for the following components:      Result Value   Sodium 133 (*)    Glucose, Bld 118 (*)    BUN 46 (*)    Creatinine, Ser 4.07 (*)    Calcium 8.3 (*)    GFR, Estimated 12 (*)    All other components within normal limits  CBC - Abnormal; Notable for the following components:   WBC 13.2 (*)    Hemoglobin 11.2 (*)    HCT 35.5 (*)    All other components within normal limits  HEPATIC FUNCTION PANEL - Abnormal; Notable for the following components:   Total Protein 6.4 (*)    Albumin 3.1 (*)  Total Bilirubin 2.0 (*)    Bilirubin, Direct 0.4 (*)    Indirect Bilirubin 1.6 (*)    All other components within normal limits  D-DIMER, QUANTITATIVE - Abnormal; Notable for the following components:   D-Dimer, Quant 0.85 (*)    All other components within normal limits  CULTURE, BLOOD (ROUTINE X 2)  CULTURE, BLOOD (ROUTINE X 2)  LIPASE, BLOOD  URINALYSIS, ROUTINE W REFLEX MICROSCOPIC  LACTIC ACID, PLASMA  LACTIC ACID, PLASMA  TROPONIN I (HIGH SENSITIVITY)  TROPONIN I (HIGH SENSITIVITY)     EKG  ED ECG REPORT I, Chesley Noon, the attending physician, personally viewed and interpreted this ECG.   Date: 08/28/2022  EKG Time: 13:22  Rate: 132  Rhythm: sinus tachycardia  Axis: Normal  Intervals:none  ST&T Change: None  RADIOLOGY Chest x-ray reviewed and interpreted by me with no infiltrate, edema, or effusion.  PROCEDURES:  Critical Care  performed: Yes, see critical care procedure note(s)  .Critical Care  Performed by: Chesley Noon, MD Authorized by: Chesley Noon, MD   Critical care provider statement:    Critical care time (minutes):  30   Critical care time was exclusive of:  Separately billable procedures and treating other patients and teaching time   Critical care was necessary to treat or prevent imminent or life-threatening deterioration of the following conditions:  Renal failure   Critical care was time spent personally by me on the following activities:  Development of treatment plan with patient or surrogate, discussions with consultants, evaluation of patient's response to treatment, examination of patient, ordering and review of laboratory studies, ordering and review of radiographic studies, ordering and performing treatments and interventions, pulse oximetry, re-evaluation of patient's condition and review of old charts   I assumed direction of critical care for this patient from another provider in my specialty: no     Care discussed with: admitting provider      MEDICATIONS ORDERED IN ED: Medications  cefTRIAXone (ROCEPHIN) 1 g in sodium chloride 0.9 % 100 mL IVPB (has no administration in time range)  azithromycin (ZITHROMAX) 500 mg in sodium chloride 0.9 % 250 mL IVPB (has no administration in time range)  morphine (PF) 4 MG/ML injection 4 mg (4 mg Intravenous Given 08/28/22 1411)  ondansetron (ZOFRAN) injection 4 mg (4 mg Intravenous Given 08/28/22 1410)  lactated ringers bolus 1,000 mL (0 mLs Intravenous Stopped 08/28/22 1517)  lactated ringers bolus 1,000 mL (1,000 mLs Intravenous New Bag/Given 08/28/22 1501)     IMPRESSION / MDM / ASSESSMENT AND PLAN / ED COURSE  I reviewed the triage vital signs and the nursing notes.                              57 y.o. female with past medical history of hypertension, atrial fibrillation, colon cancer, bipolar disorder, and polysubstance abuse who presents  to the ED complaining of pain starting in her chest and moving into her abdomen and back with some difficulty breathing over the past month.  Patient's presentation is most consistent with acute presentation with potential threat to life or bodily function.  Differential diagnosis includes, but is not limited to, ACS, PE, dissection, pneumonia, pneumothorax, pancreatitis, hepatitis, cholecystitis, biliary colic, GERD, bowel obstruction, appendicitis, diverticulitis, UTI, kidney stone.  Patient uncomfortable appearing but nontoxic and in no acute distress, vital signs remarkable for tachycardia but otherwise reassuring.  Pain is reproducible with palpation of her abdomen diffusely as well as with  with palpation of her thoracic and lumbar spine.  EKG shows sinus tachycardia with no ischemic changes, with her difficulty breathing we will further assess for PE with CTA of her chest.  With her abdominal tenderness we will also scan through her abdomen/pelvis.  Lab results/are showed mild leukocytosis without significant anemia, additional results pending.  We will treat with IV morphine and Zofran, reassess.  Labs concerning for mild leukocytosis along with significant AKI compared to labs from a year and a half ago.  No associated electrolyte abnormality or significant anemia.  CT imaging performed without contrast due to AKI, remarkable for right upper lobe pneumonia, and no acute intra-abdominal process.  We will start patient on Rocephin and azithromycin, D-dimer also noted to be elevated and perfusion scan was ordered to rule out PE.  Troponin within normal limits and I doubt ACS.  Case discussed with hospitalist for admission.      FINAL CLINICAL IMPRESSION(S) / ED DIAGNOSES   Final diagnoses:  Pneumonia of right upper lobe due to infectious organism  AKI (acute kidney injury) (HCC)     Rx / DC Orders   ED Discharge Orders     None        Note:  This document was prepared using Dragon  voice recognition software and may include unintentional dictation errors.   Chesley Noon, MD 08/28/22 1556

## 2022-08-28 NOTE — Consult Note (Signed)
ANTICOAGULATION CONSULT NOTE - Initial Consult  Pharmacy Consult for Heparin Infusion Indication: pulmonary embolus  Allergies  Allergen Reactions   Lorazepam Hives and Swelling    Patient reports receiving lorazepam intensol in the hospital and experienced swelling with hives.   Gabapentin Rash    Patient Measurements: Height: 5\' 7"  (170.2 cm) Weight: 96.6 kg (212 lb 15.4 oz) IBW/kg (Calculated) : 61.6 Heparin Dosing Weight: 82.9 kg  Vital Signs: Temp: 98 F (36.7 C) (06/28 1324) BP: 106/58 (06/28 1600) Pulse Rate: 77 (06/28 1615)  Labs: Recent Labs    08/28/22 1337 08/28/22 1524  HGB 11.2*  --   HCT 35.5*  --   PLT 344  --   CREATININE 4.07*  --   TROPONINIHS 4 3    Estimated Creatinine Clearance: 18.2 mL/min (A) (by C-G formula based on SCr of 4.07 mg/dL (H)).   Medical History: Past Medical History:  Diagnosis Date   Allergic rhinitis    Anxiety    Atrial fibrillation (HCC)    ~ 06/2010 single episdose, evaluted at [facility] with testing. No reccurence. Does not see cardiologist (08/01/2018)   Colon cancer [facility]) 2014   cancer of the sigmoid colon   Depression    Diverticulitis    "this is my 2nd time in hospital w/this in the last 2 wk" (06/21/2012)   Dysrhythmia    1 time issue with A. Fib. In the hospital for 1 week. Spontaneously convereted to NSR.   GERD (gastroesophageal reflux disease)    Gout    Hypertension    Lower extremity edema    Migraines    Osteoarthritis    Peripheral neuropathy    PTSD (post-traumatic stress disorder)    Restless legs syndrome    S/P chemotherapy, time since less than 4 weeks    Tremor, essential    Assessment: Sheri Calderon is a 57 y.o. female presenting with acute renal failure. PMH significant for HTN, colon cancer (s/p bowel resection, radiation, and chemo), PTSD, bipolar disorder. Patient was not on [facility] PTA per chart review. VQ scan showed PE. Pharmacy has been consulted to initiate and manage heparin  infusion.   Baseline Labs: Hgb 11.2, Hct 35.5, Plt 344  Baseline aPTT and PT/INR ordered  Goal of Therapy:  Heparin level 0.3-0.7 units/ml Monitor platelets by anticoagulation protocol: Yes   Plan:  Give 5000 units bolus x 1 Start heparin infusion at 1500 units/hr Check HL in 8 hours given AKI Continue to monitor H&H and platelets daily while on heparin infusion   Celene Squibb, PharmD Clinical Pharmacist 08/28/2022 6:58 PM

## 2022-08-28 NOTE — ED Notes (Signed)
Pt to NM

## 2022-08-28 NOTE — ED Notes (Signed)
Pt's O2 dropped to low 80s while sleeping. Pt placed on 3L nasal cannula. O2 currently 92%

## 2022-08-29 ENCOUNTER — Inpatient Hospital Stay: Payer: Commercial Managed Care - PPO

## 2022-08-29 DIAGNOSIS — I2699 Other pulmonary embolism without acute cor pulmonale: Secondary | ICD-10-CM

## 2022-08-29 DIAGNOSIS — J9601 Acute respiratory failure with hypoxia: Secondary | ICD-10-CM | POA: Insufficient documentation

## 2022-08-29 DIAGNOSIS — N170 Acute kidney failure with tubular necrosis: Secondary | ICD-10-CM | POA: Diagnosis not present

## 2022-08-29 DIAGNOSIS — N1831 Chronic kidney disease, stage 3a: Secondary | ICD-10-CM

## 2022-08-29 LAB — URINALYSIS, ROUTINE W REFLEX MICROSCOPIC
Bilirubin Urine: NEGATIVE
Glucose, UA: NEGATIVE mg/dL
Hgb urine dipstick: NEGATIVE
Ketones, ur: NEGATIVE mg/dL
Leukocytes,Ua: NEGATIVE
Nitrite: NEGATIVE
Protein, ur: NEGATIVE mg/dL
Specific Gravity, Urine: 1.014 (ref 1.005–1.030)
pH: 5 (ref 5.0–8.0)

## 2022-08-29 LAB — CBC
HCT: 31.5 % — ABNORMAL LOW (ref 36.0–46.0)
Hemoglobin: 9.9 g/dL — ABNORMAL LOW (ref 12.0–15.0)
MCH: 28.5 pg (ref 26.0–34.0)
MCHC: 31.4 g/dL (ref 30.0–36.0)
MCV: 90.8 fL (ref 80.0–100.0)
Platelets: 258 10*3/uL (ref 150–400)
RBC: 3.47 MIL/uL — ABNORMAL LOW (ref 3.87–5.11)
RDW: 15.4 % (ref 11.5–15.5)
WBC: 8.4 10*3/uL (ref 4.0–10.5)
nRBC: 0 % (ref 0.0–0.2)

## 2022-08-29 LAB — URINE DRUG SCREEN, QUALITATIVE (ARMC ONLY)
Amphetamines, Ur Screen: NOT DETECTED
Barbiturates, Ur Screen: NOT DETECTED
Benzodiazepine, Ur Scrn: POSITIVE — AB
Cannabinoid 50 Ng, Ur ~~LOC~~: POSITIVE — AB
Cocaine Metabolite,Ur ~~LOC~~: POSITIVE — AB
MDMA (Ecstasy)Ur Screen: NOT DETECTED
Methadone Scn, Ur: NOT DETECTED
Opiate, Ur Screen: POSITIVE — AB
Phencyclidine (PCP) Ur S: NOT DETECTED
Tricyclic, Ur Screen: POSITIVE — AB

## 2022-08-29 LAB — COMPREHENSIVE METABOLIC PANEL
ALT: 15 U/L (ref 0–44)
AST: 13 U/L — ABNORMAL LOW (ref 15–41)
Albumin: 3.5 g/dL (ref 3.5–5.0)
Alkaline Phosphatase: 56 U/L (ref 38–126)
Anion gap: 7 (ref 5–15)
BUN: 41 mg/dL — ABNORMAL HIGH (ref 6–20)
CO2: 23 mmol/L (ref 22–32)
Calcium: 8 mg/dL — ABNORMAL LOW (ref 8.9–10.3)
Chloride: 108 mmol/L (ref 98–111)
Creatinine, Ser: 2.37 mg/dL — ABNORMAL HIGH (ref 0.44–1.00)
GFR, Estimated: 23 mL/min — ABNORMAL LOW (ref >60)
Glucose, Bld: 97 mg/dL (ref 70–99)
Potassium: 5.2 mmol/L — ABNORMAL HIGH (ref 3.5–5.1)
Sodium: 138 mmol/L (ref 135–145)
Total Bilirubin: 0.5 mg/dL (ref 0.3–1.2)
Total Protein: 6.7 g/dL (ref 6.5–8.1)

## 2022-08-29 LAB — BASIC METABOLIC PANEL
Anion gap: 5 (ref 5–15)
BUN: 38 mg/dL — ABNORMAL HIGH (ref 6–20)
CO2: 25 mmol/L (ref 22–32)
Calcium: 8.2 mg/dL — ABNORMAL LOW (ref 8.9–10.3)
Chloride: 111 mmol/L (ref 98–111)
Creatinine, Ser: 2 mg/dL — ABNORMAL HIGH (ref 0.44–1.00)
GFR, Estimated: 29 mL/min — ABNORMAL LOW (ref >60)
Glucose, Bld: 102 mg/dL — ABNORMAL HIGH (ref 70–99)
Potassium: 5.7 mmol/L — ABNORMAL HIGH (ref 3.5–5.1)
Sodium: 141 mmol/L (ref 135–145)

## 2022-08-29 LAB — CULTURE, BLOOD (ROUTINE X 2): Culture: NO GROWTH

## 2022-08-29 LAB — HEPARIN LEVEL (UNFRACTIONATED)
Heparin Unfractionated: 0.45 IU/mL (ref 0.30–0.70)
Heparin Unfractionated: 0.56 IU/mL (ref 0.30–0.70)

## 2022-08-29 LAB — HIV ANTIBODY (ROUTINE TESTING W REFLEX): HIV Screen 4th Generation wRfx: NONREACTIVE

## 2022-08-29 LAB — PROTIME-INR
INR: 1.1 (ref 0.8–1.2)
Prothrombin Time: 14.1 seconds (ref 11.4–15.2)

## 2022-08-29 LAB — POTASSIUM
Potassium: 5.6 mmol/L — ABNORMAL HIGH (ref 3.5–5.1)
Potassium: 5.1 mmol/L (ref 3.5–5.1)

## 2022-08-29 LAB — APTT: aPTT: 97 seconds — ABNORMAL HIGH (ref 24–36)

## 2022-08-29 LAB — LACTIC ACID, PLASMA: Lactic Acid, Venous: 0.4 mmol/L — ABNORMAL LOW (ref 0.5–1.9)

## 2022-08-29 MED ORDER — CALCIUM GLUCONATE-NACL 1-0.675 GM/50ML-% IV SOLN
1.0000 g | Freq: Once | INTRAVENOUS | Status: AC
  Administered 2022-08-29: 1000 mg via INTRAVENOUS
  Filled 2022-08-29: qty 50

## 2022-08-29 MED ORDER — SODIUM ZIRCONIUM CYCLOSILICATE 10 G PO PACK
10.0000 g | PACK | Freq: Once | ORAL | Status: AC
  Administered 2022-08-29: 10 g via ORAL
  Filled 2022-08-29: qty 1

## 2022-08-29 MED ORDER — SODIUM BICARBONATE 8.4 % IV SOLN
25.0000 meq | Freq: Once | INTRAVENOUS | Status: AC
  Administered 2022-08-29: 25 meq via INTRAVENOUS
  Filled 2022-08-29: qty 50

## 2022-08-29 MED ORDER — SODIUM ZIRCONIUM CYCLOSILICATE 5 G PO PACK
5.0000 g | PACK | Freq: Once | ORAL | Status: AC
  Administered 2022-08-29: 5 g via ORAL
  Filled 2022-08-29: qty 1

## 2022-08-29 NOTE — Consult Note (Signed)
ANTICOAGULATION CONSULT NOTE  Pharmacy Consult for Heparin Infusion Indication: pulmonary embolus  Allergies  Allergen Reactions   Lorazepam Hives and Swelling    Patient reports receiving lorazepam intensol in the hospital and experienced swelling with hives.   Gabapentin Rash    Patient Measurements: Height: 5\' 7"  (170.2 cm) Weight: 98.8 kg (217 lb 13 oz) IBW/kg (Calculated) : 61.6 Heparin Dosing Weight: 82.9 kg  Vital Signs: Temp: 98.7 F (37.1 C) (06/29 0224) Temp Source: Oral (06/29 0224) BP: 107/76 (06/29 0224) Pulse Rate: 70 (06/29 0224)  Labs: Recent Labs    08/28/22 1337 08/28/22 1524 08/28/22 2344 08/29/22 0358  HGB 11.2*  --   --  9.9*  HCT 35.5*  --   --  31.5*  PLT 344  --   --  258  APTT  --   --  97*  --   LABPROT  --   --   --  14.1  INR  --   --   --  1.1  HEPARINUNFRC  --   --   --  0.45  CREATININE 4.07*  --   --  2.37*  TROPONINIHS 4 3  --   --      Estimated Creatinine Clearance: 31.6 mL/min (A) (by C-G formula based on SCr of 2.37 mg/dL (H)).   Medical History: Past Medical History:  Diagnosis Date   Allergic rhinitis    Anxiety    Atrial fibrillation (HCC)    ~ 06/2010 single episdose, evaluted at [facility] with testing. No reccurence. Does not see cardiologist (08/01/2018)   Colon cancer [facility]) 2014   cancer of the sigmoid colon   Depression    Diverticulitis    "this is my 2nd time in hospital w/this in the last 2 wk" (06/21/2012)   Dysrhythmia    1 time issue with A. Fib. In the hospital for 1 week. Spontaneously convereted to NSR.   GERD (gastroesophageal reflux disease)    Gout    Hypertension    Lower extremity edema    Migraines    Osteoarthritis    Peripheral neuropathy    PTSD (post-traumatic stress disorder)    Restless legs syndrome    S/P chemotherapy, time since less than 4 weeks    Tremor, essential    Assessment: CERAH RYCHLIK is a 57 y.o. female presenting with acute renal failure. PMH significant for  HTN, colon cancer (s/p bowel resection, radiation, and chemo), PTSD, bipolar disorder. Patient was not on [facility] PTA per chart review. VQ scan showed PE. Pharmacy has been consulted to initiate and manage heparin infusion.   Baseline Labs: Hgb 11.2, Hct 35.5, Plt 344  Baseline aPTT and PT/INR ordered  Goal of Therapy:  Heparin level 0.3-0.7 units/ml Monitor platelets by anticoagulation protocol: Yes  6/29 0358 HL 0.45, therapeutic x 1   Plan:  Continue heparin infusion at 1500 units/hr Recheck HL in 6 hrs to confirm Continue to monitor H&H and platelets daily while on heparin infusion   Otelia Sergeant, PharmD, [facility] 08/29/2022 4:55 AM

## 2022-08-29 NOTE — Consult Note (Signed)
[facility]  CONSULT NOTE    Date: 08/29/2022                  Patient Name:  Sheri Calderon  MRN: [id number]  DOB: January 01, 1966  Age / Sex: 57 y.o., female         PCP: Marletta Lor, NP                 Service Requesting Consult: TRH                 Reason for Consult: Acute kidney injury            History of Present Illness: Sheri Calderon is a 57 y.o.  female with past medical conditions including atrial fibrillation (single episode) hypertension, colon cancer with bowel resection (radiation and chemotherapy 10 years ago) PTSD, bipolar disorder, who was admitted to [facility] on 08/28/2022 for Abdominal distension [R14.0] Elevated liver function tests [R79.89] Other ascites [R18.8] AKI (acute kidney injury) (HCC) [N17.9] Acute renal failure, unspecified acute renal failure type (HCC) [N17.9] Pneumonia of right upper lobe due to infectious organism [J18.9] Acute renal failure superimposed on stage 3a chronic kidney disease (HCC) [N17.9, N18.31]  Patient presents to the emergency department complaining of chest pain that began 1 week prior.  Patient also states that since her colon cancer resection, she has battled with diarrhea however this has increased over the past week as well.  Patient states that frequently travel therefore dining out however when home they eat mostly home-cooked meals.  Also complains of lower back pain.  States she was on multiple muscle relaxers but has stopped taking all medications.  Denies NSAID use.  Periodically takes Tylenol and hydrocodone for pain.  Labs on ED arrival significant for sodium 133, BUN 46, creatinine 4.07 with GFR 12, calcium 8.3, and hemoglobin 11.2.  Chest x-ray negative for any acute findings.  CT chest/abdomen/pelvis negative for renal obstruction, stable renal cyst.  Medications: Outpatient medications: Medications Prior to Admission  Medication Sig Dispense Refill Last Dose   albuterol (VENTOLIN HFA) 108 (90  Base) MCG/ACT inhaler Inhale 1 puff into the lungs every 6 (six) hours as needed for wheezing or shortness of breath.   prn at [facility]   ALPRAZolam (XANAX) 0.5 MG tablet Take 0.5 mg by mouth 3 (three) times daily as needed for anxiety.  2 08/28/2022   amLODipine (NORVASC) 10 MG tablet Take 10 mg by mouth daily.   08/28/2022   benzonatate (TESSALON) 100 MG capsule Take 100 mg by mouth 2 (two) times daily.   prn at [facility]   Butalbital-APAP-Caffeine 50-300-40 MG CAPS Take 1 tablet by mouth daily.   Past Week   cyclobenzaprine (FLEXERIL) 10 MG tablet Take 10 mg by mouth 3 (three) times daily as needed.   08/28/2022   DULoxetine (CYMBALTA) 60 MG capsule Take 1 capsule by mouth daily.   08/28/2022   lisinopril (ZESTRIL) 40 MG tablet TAKE 1 TABLET BY MOUTH EVERY DAY 30 tablet 1 08/28/2022   metoprolol tartrate (LOPRESSOR) 25 MG tablet TAKE 1 TABLET BY MOUTH TWICE A DAY 180 tablet 1 08/28/2022   nitroGLYCERIN (NITROSTAT) 0.4 MG SL tablet Place 1 tablet (0.4 mg total) under the tongue every 5 (five) minutes as needed for chest pain. 90 tablet 3 prn at [facility]k   omeprazole (PRILOSEC) 20 MG capsule Take 20 mg by mouth daily.   08/28/2022   ondansetron (ZOFRAN) 8 MG tablet Take 8 mg by mouth every  8 (eight) hours as needed.   Past Week   pregabalin (LYRICA) 150 MG capsule Take 150 mg by mouth 2 (two) times daily.   08/28/2022   prochlorperazine (COMPAZINE) 10 MG tablet Take 10 mg by mouth every 8 (eight) hours as needed for nausea or vomiting.   Past Week   rizatriptan (MAXALT) 10 MG tablet Take 10 mg by mouth See admin instructions. Take 10 mg by mouth at onset of headache. Repeat in 2 hours if needed. Maximum 2 tablets in 24 hours.  3 Past Month   rosuvastatin (CRESTOR) 20 MG tablet Take 1 tablet (20 mg total) by mouth daily. 90 tablet 3 08/28/2022   traMADol (ULTRAM) 50 MG tablet Take 50 mg by mouth 2 (two) times daily as needed.   08/27/2022   Aspirin 81 MG CAPS Take 81 mg by mouth daily. (Patient not taking: Reported on  08/28/2022) 90 capsule 3 Not Taking   ezetimibe (ZETIA) 10 MG tablet Take 10 mg by mouth daily. (Patient not taking: Reported on 08/28/2022)   Not Taking   gemfibrozil (LOPID) 600 MG tablet Take 1 tablet by mouth daily. (Patient not taking: Reported on 08/28/2022)   Not Taking    Current medications: Current Facility-Administered Medications  Medication Dose Route Frequency Provider Last Rate Last Admin   0.9 %  sodium chloride infusion   Intravenous Continuous Marrion Coy, MD 125 mL/hr at 08/29/22 1158 New Bag at 08/29/22 1158   albuterol (PROVENTIL) (2.5 MG/3ML) 0.083% nebulizer solution 3 mL  3 mL Inhalation Q4H PRN Marrion Coy, MD       ALPRAZolam Prudy Feeler) tablet 0.5 mg  0.5 mg Oral TID PRN Marrion Coy, MD   0.5 mg at 08/29/22 0941   calcium gluconate 1 g/ 50 mL sodium chloride IVPB  1 g Intravenous Once Marrion Coy, MD       DULoxetine (CYMBALTA) DR capsule 60 mg  60 mg Oral Daily Marrion Coy, MD   60 mg at 08/29/22 0941   heparin ADULT infusion 100 units/mL (25000 units/235mL)  1,500 Units/hr Intravenous Continuous CoulterEber Jones, RPH 15 mL/hr at 08/29/22 1148 1,500 Units/hr at 08/29/22 1148   metoprolol tartrate (LOPRESSOR) tablet 25 mg  25 mg Oral BID Marrion Coy, MD   25 mg at 08/29/22 0941   oxyCODONE-acetaminophen (PERCOCET/ROXICET) 5-325 MG per tablet 1 tablet  1 tablet Oral Q4H PRN Marrion Coy, MD   1 tablet at 08/29/22 1129   rosuvastatin (CRESTOR) tablet 20 mg  20 mg Oral Daily Marrion Coy, MD   20 mg at 08/29/22 0941   sodium zirconium cyclosilicate (LOKELMA) packet 10 g  10 g Oral Once Marrion Coy, MD          Allergies: Allergies  Allergen Reactions   Lorazepam Hives and Swelling    Patient reports receiving lorazepam intensol in the hospital and experienced swelling with hives.   Gabapentin Rash      Past Medical History: Past Medical History:  Diagnosis Date   Allergic rhinitis    Anxiety    Atrial fibrillation (HCC)    ~ 06/2010 single episdose, evaluted  at [facility] with testing. No reccurence. Does not see cardiologist (08/01/2018)   Colon cancer [facility]) 2014   cancer of the sigmoid colon   Depression    Diverticulitis    "this is my 2nd time in hospital w/this in the last 2 wk" (06/21/2012)   Dysrhythmia    1 time issue with A. Fib. In the hospital for 1 week.  Spontaneously convereted to NSR.   GERD (gastroesophageal reflux disease)    Gout    Hypertension    Lower extremity edema    Migraines    Osteoarthritis    Peripheral neuropathy    PTSD (post-traumatic stress disorder)    Restless legs syndrome    S/P chemotherapy, time since less than 4 weeks    Tremor, essential      Past Surgical History: Past Surgical History:  Procedure Laterality Date   APPENDECTOMY     CARDIOVASCULAR STRESS TEST     06/24/10 [facility]): No ischemia, EF 53%.   COLONOSCOPY N/A 08/01/2017   Procedure: COLONOSCOPY;  Surgeon: Toney Reil, MD;  Location: [facility] ENDOSCOPY;  Service: Gastroenterology;  Laterality: N/A;   INCISIONAL HERNIA REPAIR  08/03/2018   INCISIONAL HERNIA REPAIR N/A 08/03/2018   Procedure: LAPAROSCOPIC INCISIONAL HERNIA REPAIR WITH MESH;  Surgeon: Abigail Miyamoto, MD;  Location: [facility] OR;  Service: General;  Laterality: N/A;   LEFT HEART CATH AND CORONARY ANGIOGRAPHY N/A 04/09/2020   Procedure: LEFT HEART CATH AND CORONARY ANGIOGRAPHY;  Surgeon: Elder Negus, MD;  Location: [facility] INVASIVE CV LAB;  Service: Cardiovascular;  Laterality: N/A;   PARTIAL COLECTOMY N/A 06/28/2012   Procedure: PARTIAL COLECTOMY;  Surgeon: Shelly Rubenstein, MD;  Location: [facility] OR;  Service: General;  Laterality: N/A;   PORTACATH PLACEMENT N/A 07/11/2012   Procedure: INSERTION PORT-A-CATH;  Surgeon: Shelly Rubenstein, MD;  Location: [facility] ORS;  Service: General;  Laterality: N/A;   portacath removal     TRANSTHORACIC ECHOCARDIOGRAM     06/22/10 [facility]): LVEF 55%. Normal LA size. Mild MR/TR. Trace PI.   TUBAL LIGATION  ~ 1989      Family History: Family History  Problem Relation Age of Onset   Diabetes Mother    Heart disease Mother    Stomach cancer Mother    Ovarian cancer Mother 91   COPD Father    Hypertension Father    Hyperlipidemia Father    Colon polyps Father        59 lifetime colon polyps   Down syndrome Paternal Aunt    Cancer Paternal Uncle        cancer in the spine   COPD Maternal Grandmother    Stomach cancer Paternal Grandmother    Vascular Disease Brother    Aneurysm Brother      Social History: Social History   Socioeconomic History   Marital status: Married    Spouse name: Tawanna Cooler   Number of children: 1   Years of education: Not on file   Highest education level: Not on file  Occupational History    Employer: [employer]    Comment: Holiday representative  Tobacco Use   Smoking status: Every Day    Packs/day: 0.20    Years: 15.00    Additional pack years: 0.00    Total pack years: 3.00    Types: Cigarettes    Start date: 01/17/1991   Smokeless tobacco: Never  Vaping Use   Vaping Use: Never used  Substance and Sexual Activity   Alcohol use: No    Alcohol/week: 0.0 standard drinks of alcohol   Drug use: No   Sexual activity: Yes    Birth control/protection: Surgical  Other Topics Concern   Not on file  Social History Narrative   Married w/grown daughter born 11 - 1 grandson born 2009   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 [employer]  Husband works out of town frequently   4 caffeine drinks ([place]) qd   10/29/2014 update   Social Determinants of Health   Financial Resource Strain: Not on file  Food Insecurity: No Food Insecurity (08/29/2022)   Hunger Vital Sign    Worried About Running Out of Food in the Last Year: Never true    Ran Out of Food in the Last Year: Never true  Transportation Needs: No Transportation Needs (08/29/2022)   PRAPARE - Administrator, Civil Service (Medical): No     Lack of Transportation (Non-Medical): No  Physical Activity: Not on file  Stress: Not on file  Social Connections: Not on file  Intimate Partner Violence: Not At Risk (08/29/2022)   Humiliation, Afraid, Rape, and Kick questionnaire    Fear of Current or Ex-Partner: No    Emotionally Abused: No    Physically Abused: No    Sexually Abused: No     Review of Systems: Review of Systems  Constitutional:  Negative for chills, fever and malaise/fatigue.  HENT:  Negative for congestion, sore throat and tinnitus.   Eyes:  Negative for blurred vision and redness.  Respiratory:  Negative for cough, shortness of breath and wheezing.   Cardiovascular:  Positive for chest pain. Negative for palpitations, claudication and leg swelling.  Gastrointestinal:  Negative for abdominal pain, blood in stool, diarrhea, nausea and vomiting.  Genitourinary:  Negative for flank pain, frequency and hematuria.  Musculoskeletal:  Negative for back pain, falls and myalgias.  Skin:  Negative for rash.  Neurological:  Negative for dizziness, weakness and headaches.  Endo/Heme/Allergies:  Does not bruise/bleed easily.  Psychiatric/Behavioral:  Negative for depression. The patient is not nervous/anxious and does not have insomnia.     Vital Signs: Blood pressure 111/64, pulse 72, temperature 97.7 F (36.5 C), resp. rate 16, height 5\' 7"  (1.702 m), weight 98.8 kg, last menstrual period 10/12/2012, SpO2 100 %.  Weight trends: Filed Weights   08/28/22 1854 08/29/22 0224  Weight: 96.6 kg 98.8 kg    Physical Exam: General: Restless, grimacing  Head: Normocephalic, atraumatic. Moist oral mucosal membranes  Eyes: Anicteric  Neck: Supple, trachea midline  Lungs:  Coarse, NCO 2  Heart: Regular rate and rhythm  Abdomen:  Soft, nontender, obese  Extremities: Trace peripheral edema.  Neurologic: Nonfocal, moving all four extremities  Skin: No lesions  Access: None     Lab results: Basic Metabolic Panel: Recent  Labs  Lab 08/28/22 1337 08/29/22 0358 08/29/22 0933  NA 133* 138 141  K 4.6 5.2* 5.7*  CL 100 108 111  CO2 22 23 25   GLUCOSE 118* 97 102*  BUN 46* 41* 38*  CREATININE 4.07* 2.37* 2.00*  CALCIUM 8.3* 8.0* 8.2*    Liver Function Tests: Recent Labs  Lab 08/28/22 0420 08/29/22 0358  AST 29 13*  ALT 27 15  ALKPHOS 40 56  BILITOT 2.0* 0.5  PROT 6.4* 6.7  ALBUMIN 3.1* 3.5   Recent Labs  Lab 08/28/22 0420  LIPASE 29   No results for input(s): "AMMONIA" in the last 168 hours.  CBC: Recent Labs  Lab 08/28/22 1337 08/29/22 0358  WBC 13.2* 8.4  HGB 11.2* 9.9*  HCT 35.5* 31.5*  MCV 89.0 90.8  PLT 344 258    Cardiac Enzymes: No results for input(s): "CKTOTAL", "CKMB", "CKMBINDEX", "TROPONINI" in the last 168 hours.  BNP: Invalid input(s): "POCBNP"  CBG: No results for input(s): "GLUCAP" in the last 168 hours.  Microbiology: Results for  orders placed or performed during the hospital encounter of 08/28/22  Culture, blood (routine x 2)     Status: None (Preliminary result)   Collection Time: 08/28/22  3:46 PM   Specimen: BLOOD RIGHT HAND  Result Value Ref Range Status   Specimen Description BLOOD RIGHT HAND  Final   Special Requests   Final    BOTTLES DRAWN AEROBIC AND ANAEROBIC Blood Culture adequate volume   Culture   Final    NO GROWTH < 12 HOURS Performed at [facility], [address], [place], [place] [address]    Report Status PENDING  Incomplete  Culture, blood (routine x 2)     Status: None (Preliminary result)   Collection Time: 08/28/22  4:04 PM   Specimen: BLOOD  Result Value Ref Range Status   Specimen Description BLOOD LEFT ANTECUBITAL  Final   Special Requests   Final    BOTTLES DRAWN AEROBIC AND ANAEROBIC Blood Culture adequate volume   Culture   Final    NO GROWTH < 12 HOURS Performed at [facility], [address], [place], [place] [address]    Report Status PENDING  Incomplete    Coagulation Studies: Recent  Labs    08/29/22 0358  LABPROT 14.1  INR 1.1    Urinalysis: Recent Labs    08/29/22 0140  COLORURINE YELLOW*  LABSPEC 1.014  PHURINE 5.0  GLUCOSEU NEGATIVE  HGBUR NEGATIVE  BILIRUBINUR NEGATIVE  KETONESUR NEGATIVE  PROTEINUR NEGATIVE  NITRITE NEGATIVE  LEUKOCYTESUR NEGATIVE      Imaging: NM Pulmonary Perfusion  Result Date: 08/28/2022 CLINICAL DATA:  Shortness of breath colon cancer EXAM: NUCLEAR MEDICINE PERFUSION LUNG SCAN TECHNIQUE: Perfusion images were obtained in multiple projections after intravenous injection of radiopharmaceutical. RADIOPHARMACEUTICALS:  4.37 mCi Tc-88m MAA IV COMPARISON:  Chest x-ray 08/28/2022 FINDINGS: There are multiple bilateral wedge-shaped perfusion defects. IMPRESSION: Pulmonary embolism present by Pisaped criteria Critical Value/emergent results were called by telephone at the time of interpretation on 08/28/2022 at 6:31 pm to provider Dr. Scotty Court, Who verbally acknowledged these results. Electronically Signed   By: Jasmine Pang M.D.   On: 08/28/2022 18:31   CT CHEST ABDOMEN PELVIS WO CONTRAST  Result Date: 08/28/2022 CLINICAL DATA:  Respiratory illness. Chest pain 1 week getting worse. EXAM: CT CHEST, ABDOMEN AND PELVIS WITHOUT CONTRAST TECHNIQUE: Multidetector CT imaging of the chest, abdomen and pelvis was performed following the standard protocol without IV contrast. RADIATION DOSE REDUCTION: This exam was performed according to the departmental dose-optimization program which includes automated exposure control, adjustment of the mA and/or kV according to patient size and/or use of iterative reconstruction technique. COMPARISON:  Chest CT 08/09/2018 and abdominopelvic CT 11/07/2018 FINDINGS: CT CHEST FINDINGS Cardiovascular: Heart is normal size. Thoracic aorta is normal in caliber. Pulmonary arterial system and remaining vascular structures are unremarkable. Mediastinum/Nodes: No mediastinal or hilar adenopathy. Remaining mediastinal structures  are normal. Lungs/Pleura: Lungs are adequately inflated as there is centrilobular emphysematous disease present. Focal ground-glass opacification over the posterior right upper lobe. Linear density over the anterior inferior aspect of the right upper lobe likely scarring/atelectasis. Minimal linear density over the right middle lobe compatible with atelectasis/scarring. Linear density over the lingula likely atelectasis/scarring. Minimal patchy peripheral linear density over the anterior and medial left upper lobe likely atelectasis. No effusion. Airways are normal. Musculoskeletal: No focal abnormality. CT ABDOMEN PELVIS FINDINGS Hepatobiliary: Gallbladder, liver and biliary tree are normal. Pancreas: Normal. Spleen: Normal. Adrenals/Urinary Tract: Adrenal glands are normal. Kidneys are normal in size.  No nephrolithiasis. Right parapelvic renal cysts unchanged. Bilateral parapelvic renal cysts unchanged. No follow-up imaging recommended. 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 Ureters and bladder are otherwise unremarkable. Stomach/Bowel: Stomach and small bowel are normal. Findings suggesting previous appendectomy. Surgical suture line over the rectosigmoid junction as the colon is otherwise unremarkable. Vascular/Lymphatic: Minimal calcified plaque over the abdominal aorta which is normal in caliber. No evidence of adenopathy. Reproductive: Normal. Other: No free fluid or focal inflammatory change. Musculoskeletal: No focal abnormality. IMPRESSION: 1. Focal ground-glass opacification over the posterior right upper lobe which may be due to atypical infectious or inflammatory process. Consider follow-up noncontrast chest CT 4-6 weeks. 2. Scattered areas of linear density over the lungs bilaterally likely atelectasis/scarring. 3. No acute findings in the abdomen/pelvis. 4. Bilateral parapelvic renal cysts unchanged. Evidence of patient's duplicated left intrarenal collecting  system and ureter. 5. Aortic atherosclerosis. Aortic Atherosclerosis (ICD10-I70.0) and Emphysema (ICD10-J43.9). Electronically Signed   By: Elberta Fortis M.D.   On: 08/28/2022 15:37   DG Chest 2 View  Result Date: 08/28/2022 CLINICAL DATA:  Chest pain. EXAM: CHEST - 2 VIEW COMPARISON:  01/29/2020. FINDINGS: Linear atelectasis or scarring in the lingula. No consolidation or pulmonary edema. Normal heart size and mediastinal contours. No pleural effusion or pneumothorax. IMPRESSION: No evidence of acute cardiopulmonary disease. Electronically Signed   By: Orvan Falconer M.D.   On: 08/28/2022 13:55     Assessment & Plan: Sheri Calderon is a 57 y.o.  female with past medical conditions including atrial fibrillation (single episode) hypertension, colon cancer with bowel resection (radiation and chemotherapy 10 years ago) PTSD, bipolar disorder, who was admitted to [facility] on 08/28/2022 for Abdominal distension [R14.0] Elevated liver function tests [R79.89] Other ascites [R18.8] AKI (acute kidney injury) (HCC) [N17.9] Acute renal failure, unspecified acute renal failure type (HCC) [N17.9] Pneumonia of right upper lobe due to infectious organism [J18.9] Acute renal failure superimposed on stage 3a chronic kidney disease (HCC) [N17.9, N18.31]  Acute kidney injury with hyperkalemia appears secondary to ATN from dehydration, increased episodes of diarrhea.  Baseline appears to be 0.86 from April 09, 2020.  Episodes since that time believed to be acute kidney injuries.  CT abdomen pelvis negative for obstruction with stable renal cyst.  Agree with IV hydration.  Potassium 5.7 today, primary team has ordered Lokelma 10 g.  No acute indication for dialysis.  Continue to avoid nephrotoxic agents and therapies.  2. Anemia of kidney injury Normocytic Lab Results  Component Value Date   HGB 9.9 (L) 08/29/2022    Hemoglobin reduced but stable.  No need for ESA or transfusion at this time.    LOS:  1   6/29/202412:00 PM

## 2022-08-29 NOTE — Progress Notes (Signed)
Progress Note   Patient: Sheri Calderon BJY:[id number] DOB: May 21, 1965 DOA: 08/28/2022     1 DOS: the patient was seen and examined on 08/29/2022   Brief hospital course: Sheri Calderon is a 57 y.o. female with medical history significant of essential hypertension, single episode atrial fibrillation 2012, colon cancer status post bowel resection, radiation and chemotherapy 10 years ago, depression, PTSD, depression, bipolar disorder, who present to the hospital with acute renal failure.  Patient had a 2 weeks history of nausea vomiting diarrhea. Upon arriving the hospital, her blood pressure was low, creatinine was 4.07, VQ scan showed PE. Patient was placed on IV fluids and heparin drip.   Principal Problem:   Acute renal failure superimposed on stage 3a chronic kidney disease (HCC) Active Problems:   Cancer of sigmoid colon (HCC)   Polysubstance abuse (HCC)   Severe manic bipolar 1 disorder with psychotic behavior (HCC)   Paroxysmal atrial fibrillation (HCC)   Primary hypertension   Hyponatremia   Acute pulmonary embolism (HCC)   Acute hypoxemic respiratory failure (HCC)   Assessment and Plan: Acute hypoxemic respiratory failure secondary to PE. Pulmonary emboli. Patient came to the hospital complaining of shortness of breath, oxygen saturation dropped down to 83% overnight.  Consistent with acute respiratory failure with hypoxemia.  VQ scan showed clear evidence of PE.  Patient is placed on IV heparin. May transition to Eliquis when renal function recovers.  Acute renal failure superimposed on chronic kidney disease stage IIIa. Hyperkalemia. Gastroenteritis. Patient had a significant nausea vomiting diarrhea for 2 weeks prior to admission.  She has severe renal failure at the time of admission. She was treated with normal saline, she developed hyperkalemia with improving renal function.  She be given Lokelma, calcium gluconate and sodium bicarb IV infusion. Recheck potassium level  in the afternoon.  Continue telemetry monitoring. Diarrhea has resolved, patient better tolerating diet.  Discontinue enteric precautions.  Not able to send stool for studies.  History of colon cancer. Abdominal distention. Abdominal distention symptoms better today.  Ultrasound pending.  Polydrug abuse. Tobacco abuse. Patient drug screen positive for cocaine, marijuana, benzodiazepine, opioids, and Tricyclic.  Advised to quit.       Subjective:  Patient still complaining lower back pain, abdominal pain seems to be better.  No nausea vomiting or diarrhea.  Tolerating diet. Still has some short of breath, was placed on oxygen last night due to desaturation to 83%.  Physical Exam: Vitals:   08/29/22 0128 08/29/22 0130 08/29/22 0224 08/29/22 0902  BP:  96/64 107/76 111/64  Pulse: 72 74 70 72  Resp: 20 20 18 16   Temp:   98.7 F (37.1 C) 97.7 F (36.5 C)  TempSrc:   Oral   SpO2: 99% 100% 100% 100%  Weight:   98.8 kg   Height:   5\' 7"  (1.702 m)    General exam: Appears calm and comfortable  Respiratory system: Clear to auscultation. Respiratory effort normal. Cardiovascular system: S1 & S2 heard, RRR. No JVD, murmurs, rubs, gallops or clicks. No pedal edema. Gastrointestinal system: Abdomen is nondistended, soft and nontender. No organomegaly or masses felt. Normal bowel sounds heard. Central nervous system: Alert and oriented x3. No focal neurological deficits. Extremities: Symmetric 5 x 5 power. Skin: No rashes, lesions or ulcers Psychiatry: Judgement and insight appear normal. Mood & affect appropriate.    Data Reviewed:  Lab results reviewed.   Family Communication: Husband updated at bedside.  Disposition: Status is: Inpatient Remains inpatient appropriate because: Severity  of disease, IV treatment.     Time spent: 55 minutes  Author: Marrion Coy, MD 08/29/2022 12:00 PM  For on call review [email or web address].
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
## 2022-08-29 NOTE — Plan of Care (Signed)
  Problem: Education: Goal: Knowledge of General Education information will improve Description: Including pain rating scale, medication(s)/side effects and non-pharmacologic comfort measures Outcome: Completed/Met   Problem: Clinical Measurements: Goal: Ability to maintain clinical measurements within normal limits will improve Outcome: Completed/Met Goal: Will remain free from infection Outcome: Progressing Goal: Diagnostic test results will improve Outcome: Completed/Met Goal: Respiratory complications will improve Outcome: Progressing Goal: Cardiovascular complication will be avoided Outcome: Completed/Met   Problem: Clinical Measurements: Goal: Will remain free from infection Outcome: Progressing   Problem: Clinical Measurements: Goal: Diagnostic test results will improve Outcome: Completed/Met   Problem: Clinical Measurements: Goal: Cardiovascular complication will be avoided Outcome: Completed/Met   Problem: Activity: Goal: Risk for activity intolerance will decrease Outcome: Completed/Met   Problem: Nutrition: Goal: Adequate nutrition will be maintained Outcome: Progressing   Problem: Coping: Goal: Level of anxiety will decrease Outcome: Completed/Met   Problem: Elimination: Goal: Will not experience complications related to bowel motility Outcome: Completed/Met Goal: Will not experience complications related to urinary retention Outcome: Completed/Met   Problem: Elimination: Goal: Will not experience complications related to bowel motility Outcome: Completed/Met Goal: Will not experience complications related to urinary retention Outcome: Completed/Met   Problem: Pain Managment: Goal: General experience of comfort will improve Outcome: Progressing   Problem: Safety: Goal: Ability to remain free from injury will improve Outcome: Progressing   Problem: Skin Integrity: Goal: Risk for impaired skin integrity will decrease Outcome: Completed/Met

## 2022-08-29 NOTE — ED Notes (Signed)
Pt refusing foley catheter at this time.

## 2022-08-29 NOTE — Hospital Course (Signed)
Sheri Calderon is a 57 y.o. female with medical history significant of essential hypertension, single episode atrial fibrillation 2012, colon cancer status post bowel resection, radiation and chemotherapy 10 years ago, depression, PTSD, depression, bipolar disorder, who present to the hospital with acute renal failure.  Patient had a 2 weeks history of nausea vomiting diarrhea. Upon arriving the hospital, her blood pressure was low, creatinine was 4.07, VQ scan showed PE. Patient was placed on IV fluids and heparin drip.

## 2022-08-29 NOTE — Consult Note (Signed)
ANTICOAGULATION CONSULT NOTE  Pharmacy Consult for Heparin Infusion Indication: pulmonary embolus  Allergies  Allergen Reactions   Lorazepam Hives and Swelling    Patient reports receiving lorazepam intensol in the hospital and experienced swelling with hives.   Gabapentin Rash    Patient Measurements: Height: 5\' 7"  (170.2 cm) Weight: 98.8 kg (217 lb 13 oz) IBW/kg (Calculated) : 61.6 Heparin Dosing Weight: 82.9 kg  Vital Signs: Temp: 97.7 F (36.5 C) (06/29 0902) Temp Source: Oral (06/29 0224) BP: 111/64 (06/29 0902) Pulse Rate: 72 (06/29 0902)  Labs: Recent Labs    08/28/22 1337 08/28/22 1524 08/28/22 2344 08/29/22 0358 08/29/22 0933  HGB 11.2*  --   --  9.9*  --   HCT 35.5*  --   --  31.5*  --   PLT 344  --   --  258  --   APTT  --   --  97*  --   --   LABPROT  --   --   --  14.1  --   INR  --   --   --  1.1  --   HEPARINUNFRC  --   --   --  0.45 0.56  CREATININE 4.07*  --   --  2.37* 2.00*  TROPONINIHS 4 3  --   --   --      Estimated Creatinine Clearance: 37.5 mL/min (A) (by C-G formula based on SCr of 2 mg/dL (H)).   Medical History: Past Medical History:  Diagnosis Date   Allergic rhinitis    Anxiety    Atrial fibrillation (HCC)    ~ 06/2010 single episdose, evaluted at [facility] with testing. No reccurence. Does not see cardiologist (08/01/2018)   Colon cancer [facility]) 2014   cancer of the sigmoid colon   Depression    Diverticulitis    "this is my 2nd time in hospital w/this in the last 2 wk" (06/21/2012)   Dysrhythmia    1 time issue with A. Fib. In the hospital for 1 week. Spontaneously convereted to NSR.   GERD (gastroesophageal reflux disease)    Gout    Hypertension    Lower extremity edema    Migraines    Osteoarthritis    Peripheral neuropathy    PTSD (post-traumatic stress disorder)    Restless legs syndrome    S/P chemotherapy, time since less than 4 weeks    Tremor, essential    Assessment: Sheri Calderon is a 57 y.o. female  presenting with acute renal failure. PMH significant for HTN, colon cancer (s/p bowel resection, radiation, and chemo), PTSD, bipolar disorder. Patient was not on [facility] PTA per chart review. VQ scan showed PE. Pharmacy has been consulted to initiate and manage heparin infusion.   Baseline Labs: Hgb 11.2, Hct 35.5, Plt 344  Baseline aPTT and PT/INR ordered  Goal of Therapy:  Heparin level 0.3-0.7 units/ml Monitor platelets by anticoagulation protocol: Yes  6/29 0358 HL 0.45, therapeutic x 1 6/29 1933 HL 0.56, therapeutic x 2   Plan:  Continue heparin infusion at 1500 units/hr Recheck HL with AM labs Continue to monitor H&H and platelets daily while on heparin infusion   Bettey Costa, PharmD Clinical Pharmacist 08/29/2022 10:54 AM

## 2022-08-30 DIAGNOSIS — E669 Obesity, unspecified: Secondary | ICD-10-CM | POA: Insufficient documentation

## 2022-08-30 DIAGNOSIS — J9601 Acute respiratory failure with hypoxia: Secondary | ICD-10-CM | POA: Diagnosis not present

## 2022-08-30 DIAGNOSIS — I2699 Other pulmonary embolism without acute cor pulmonale: Secondary | ICD-10-CM | POA: Diagnosis not present

## 2022-08-30 DIAGNOSIS — N1831 Chronic kidney disease, stage 3a: Secondary | ICD-10-CM | POA: Diagnosis not present

## 2022-08-30 DIAGNOSIS — E875 Hyperkalemia: Secondary | ICD-10-CM | POA: Insufficient documentation

## 2022-08-30 DIAGNOSIS — N170 Acute kidney failure with tubular necrosis: Secondary | ICD-10-CM | POA: Diagnosis not present

## 2022-08-30 LAB — BASIC METABOLIC PANEL
Anion gap: 4 — ABNORMAL LOW (ref 5–15)
BUN: 24 mg/dL — ABNORMAL HIGH (ref 6–20)
CO2: 25 mmol/L (ref 22–32)
Calcium: 8.2 mg/dL — ABNORMAL LOW (ref 8.9–10.3)
Chloride: 109 mmol/L (ref 98–111)
Creatinine, Ser: 1.28 mg/dL — ABNORMAL HIGH (ref 0.44–1.00)
GFR, Estimated: 49 mL/min — ABNORMAL LOW (ref >60)
Glucose, Bld: 98 mg/dL (ref 70–99)
Potassium: 4.9 mmol/L (ref 3.5–5.1)
Sodium: 138 mmol/L (ref 135–145)

## 2022-08-30 LAB — IRON AND TIBC
Iron: 90 ug/dL (ref 28–170)
Saturation Ratios: 32 % — ABNORMAL HIGH (ref 10.4–31.8)
TIBC: 281 ug/dL (ref 250–450)
UIBC: 191 ug/dL

## 2022-08-30 LAB — MAGNESIUM: Magnesium: 1.6 mg/dL — ABNORMAL LOW (ref 1.7–2.4)

## 2022-08-30 LAB — CBC
HCT: 29 % — ABNORMAL LOW (ref 36.0–46.0)
Hemoglobin: 8.8 g/dL — ABNORMAL LOW (ref 12.0–15.0)
MCH: 28.1 pg (ref 26.0–34.0)
MCHC: 30.3 g/dL (ref 30.0–36.0)
MCV: 92.7 fL (ref 80.0–100.0)
Platelets: 251 10*3/uL (ref 150–400)
RBC: 3.13 MIL/uL — ABNORMAL LOW (ref 3.87–5.11)
RDW: 15.1 % (ref 11.5–15.5)
WBC: 5.8 10*3/uL (ref 4.0–10.5)
nRBC: 0 % (ref 0.0–0.2)

## 2022-08-30 LAB — FERRITIN: Ferritin: 67 ng/mL (ref 11–307)

## 2022-08-30 LAB — CULTURE, BLOOD (ROUTINE X 2): Culture: NO GROWTH

## 2022-08-30 LAB — HEPARIN LEVEL (UNFRACTIONATED): Heparin Unfractionated: 0.29 IU/mL — ABNORMAL LOW (ref 0.30–0.70)

## 2022-08-30 MED ORDER — ONDANSETRON HCL 4 MG/2ML IJ SOLN
4.0000 mg | Freq: Four times a day (QID) | INTRAMUSCULAR | Status: DC | PRN
  Administered 2022-08-30 (×2): 4 mg via INTRAVENOUS
  Filled 2022-08-30 (×2): qty 2

## 2022-08-30 MED ORDER — APIXABAN 5 MG PO TABS: 5.0000 mg | ORAL_TABLET | Freq: Two times a day (BID) | ORAL | Status: DC

## 2022-08-30 MED ORDER — MAGNESIUM SULFATE 2 GM/50ML IV SOLN
2.0000 g | Freq: Once | INTRAVENOUS | Status: AC
  Administered 2022-08-30: 2 g via INTRAVENOUS
  Filled 2022-08-30: qty 50

## 2022-08-30 MED ORDER — APIXABAN 5 MG PO TABS
10.0000 mg | ORAL_TABLET | Freq: Two times a day (BID) | ORAL | Status: DC
  Administered 2022-08-30 – 2022-08-31 (×3): 10 mg via ORAL
  Filled 2022-08-30 (×3): qty 2

## 2022-08-30 MED ORDER — APIXABAN (ELIQUIS) VTE STARTER PACK (10MG AND 5MG): ORAL_TABLET | ORAL | 0 refills | Status: AC

## 2022-08-30 MED ORDER — OXYCODONE-ACETAMINOPHEN 5-325 MG PO TABS
1.0000 | ORAL_TABLET | Freq: Once | ORAL | Status: AC
  Administered 2022-08-30: 1 via ORAL
  Filled 2022-08-30: qty 1

## 2022-08-30 NOTE — Progress Notes (Signed)
Progress Note   Patient: Sheri Calderon:[id number] DOB: [birth year] DOA: 08/28/2022     2 DOS: the patient was seen and examined on 08/30/2022   Brief hospital course: Sheri Calderon is a 57 y.o. female with medical history significant of essential hypertension, single episode atrial fibrillation 2012, colon cancer status post bowel resection, radiation and chemotherapy 10 years ago, depression, PTSD, depression, bipolar disorder, who present to the hospital with acute renal failure.  Patient had a 2 weeks history of nausea vomiting diarrhea. Upon arriving the hospital, her blood pressure was low, creatinine was 4.07, VQ scan showed PE. Patient was placed on IV fluids and heparin drip.  Function had improved, anticoagulation changed to oral Eliquis on 6/30.   Principal Problem:   Acute renal failure superimposed on stage 3a chronic kidney disease (HCC) Active Problems:   Cancer of sigmoid colon (HCC)   Polysubstance abuse (HCC)   Severe manic bipolar 1 disorder with psychotic behavior (HCC)   Paroxysmal atrial fibrillation (HCC)   Primary hypertension   Hyponatremia   Acute pulmonary embolism (HCC)   Acute hypoxemic respiratory failure (HCC)   Hyperkalemia   Hypomagnesemia   Obesity (BMI 30-39.9)   Assessment and Plan: Acute hypoxemic respiratory failure secondary to PE. Pulmonary emboli. Patient came to the hospital complaining of shortness of breath, oxygen saturation dropped down to 83% overnight.  Consistent with acute respiratory failure with hypoxemia.  VQ scan showed clear evidence of PE.  Patient is placed on IV heparin. Will transition to Eliquis today.   Acute renal failure superimposed on chronic kidney disease stage IIIa. Hyperkalemia. Gastroenteritis. Hypomagnesemia. Patient had a significant nausea vomiting diarrhea for 2 weeks prior to admission.  She has severe renal failure at the time of admission. She was treated with normal saline, she developed hyperkalemia  with improving renal function.  She be given Lokelma, calcium gluconate and sodium bicarb IV infusion. Condition had improved today, potassium normalized, renal function much better with creatinine dropped down to 1.28. Replete magnesium.  Acute anemia. Most likely due to acute units and acute renal failure.  Hemoglobin is 8.8, no active bleeding.  Iron level normal.  B12 level pending.  Continue to follow.    History of colon cancer. Abdominal distention. Abdominal ultrasound did not show any significant changes in the liver and kidney.   Polydrug abuse. Tobacco abuse. Patient drug screen positive for cocaine, marijuana, benzodiazepine, opioids, and Tricyclic.  Advised to quit.      Subjective:  Patient feels much better today,  Physical Exam: Vitals:   08/29/22 2003 08/30/22 0010 08/30/22 0354 08/30/22 0840  BP: 115/80 (!) 137/103 108/74 (!) 143/86  Pulse: 60 72 75 77  Resp: 18 20 18 18   Temp: 97.8 F (36.6 C) 98.1 F (36.7 C) 98.3 F (36.8 C) 97.9 F (36.6 C)  TempSrc: Oral Oral Oral   SpO2: 95% 99% (!) 89% 92%  Weight:      Height:       General exam: Appears calm and comfortable  Respiratory system: Clear to auscultation. Respiratory effort normal. Cardiovascular system: S1 & S2 heard, RRR. No JVD, murmurs, rubs, gallops or clicks. No pedal edema. Gastrointestinal system: Abdomen is nondistended, soft and nontender. No organomegaly or masses felt. Normal bowel sounds heard. Central nervous system: Alert and oriented. No focal neurological deficits. Extremities: Symmetric 5 x 5 power. Skin: No rashes, lesions or ulcers Psychiatry: Judgement and insight appear normal. Mood & affect appropriate.    Data Reviewed:  Lab  results reviewed.  Family Communication: Husband updated at bedside.  Disposition: Status is: Inpatient Remains inpatient appropriate because: Severity of disease,     Time spent: 35 minutes  Author: Marrion Coy, MD 08/30/2022 9:43  AM  For on call review [email or web address].

## 2022-08-30 NOTE — Consult Note (Signed)
ANTICOAGULATION CONSULT NOTE  Pharmacy Consult for Heparin Infusion Indication: pulmonary embolus  Allergies  Allergen Reactions   Lorazepam Hives and Swelling    Patient reports receiving lorazepam intensol in the hospital and experienced swelling with hives.   Gabapentin Rash    Patient Measurements: Height: 5\' 7"  (170.2 cm) Weight: 98.8 kg (217 lb 13 oz) IBW/kg (Calculated) : 61.6 Heparin Dosing Weight: 82.9 kg  Vital Signs: Temp: 98.3 F (36.8 C) (06/30 0354) Temp Source: Oral (06/30 0354) BP: 108/74 (06/30 0354) Pulse Rate: 75 (06/30 0354)  Labs: Recent Labs    08/28/22 1337 08/28/22 1524 08/28/22 2344 08/29/22 0358 08/29/22 0933 08/30/22 0449  HGB 11.2*  --   --  9.9*  --  8.8*  HCT 35.5*  --   --  31.5*  --  29.0*  PLT 344  --   --  258  --  251  APTT  --   --  97*  --   --   --   LABPROT  --   --   --  14.1  --   --   INR  --   --   --  1.1  --   --   HEPARINUNFRC  --   --   --  0.45 0.56 0.29*  CREATININE 4.07*  --   --  2.37* 2.00* 1.28*  TROPONINIHS 4 3  --   --   --   --      Estimated Creatinine Clearance: 58.6 mL/min (A) (by C-G formula based on SCr of 1.28 mg/dL (H)).   Medical History: Past Medical History:  Diagnosis Date   Allergic rhinitis    Anxiety    Atrial fibrillation (HCC)    ~ 06/2010 single episdose, evaluted at [facility] with testing. No reccurence. Does not see cardiologist (08/01/2018)   Colon cancer [facility]) 2014   cancer of the sigmoid colon   Depression    Diverticulitis    "this is my 2nd time in hospital w/this in the last 2 wk" (06/21/2012)   Dysrhythmia    1 time issue with A. Fib. In the hospital for 1 week. Spontaneously convereted to NSR.   GERD (gastroesophageal reflux disease)    Gout    Hypertension    Lower extremity edema    Migraines    Osteoarthritis    Peripheral neuropathy    PTSD (post-traumatic stress disorder)    Restless legs syndrome    S/P chemotherapy, time since less than 4 weeks     Tremor, essential    Assessment: Sheri Calderon is a 57 y.o. female presenting with acute renal failure. PMH significant for HTN, colon cancer (s/p bowel resection, radiation, and chemo), PTSD, bipolar disorder. Patient was not on [facility] PTA per chart review. VQ scan showed PE. Pharmacy has been consulted to initiate and manage heparin infusion.   Baseline Labs: Hgb 11.2, Hct 35.5, Plt 344  Baseline aPTT and PT/INR ordered  Goal of Therapy:  Heparin level 0.3-0.7 units/ml Monitor platelets by anticoagulation protocol: Yes  6/29 0358 HL 0.45, therapeutic x 1 6/29 1933 HL 0.56, therapeutic x 2 6/30 0449 HL 0.29, subtherapeutic   Plan:  Increase heparin infusion to 1650 units/hr Recheck HL in 6 hrs after rate change Continue to monitor H&H and platelets daily while on heparin infusion   Otelia Sergeant, PharmD, [facility] 08/30/2022 6:17 AM

## 2022-08-30 NOTE — Progress Notes (Signed)
[facility]  ROUNDING NOTE   Subjective:   Patient seen sitting up in bed Alert and oriented Appears anxious  Creatinine 1.28  Objective:  Vital signs in last 24 hours:  Temp:  [97.8 F (36.6 C)-98.7 F (37.1 C)] 97.9 F (36.6 C) (06/30 0840) Pulse Rate:  [60-77] 77 (06/30 0840) Resp:  [16-20] 18 (06/30 0840) BP: (108-143)/(73-103) 143/86 (06/30 0840) SpO2:  [89 %-99 %] 92 % (06/30 0840)  Weight change:  Filed Weights   08/28/22 1854 08/29/22 0224  Weight: 96.6 kg 98.8 kg    Intake/Output: I/O last 3 completed shifts: In: 950.4 [P.O.:480; I.V.:470.4] Out: 1300 [Urine:1300]   Intake/Output this shift:  No intake/output data recorded.  Physical Exam: General: NAD, anxious  Head: Normocephalic, atraumatic. Moist oral mucosal membranes  Eyes: Anicteric  Lungs:  Clear to auscultation, room air  Heart: Regular rate and rhythm  Abdomen:  Soft, nontender  Extremities:  No peripheral edema.  Neurologic: Alert and oriented, moving all four extremities  Skin: No lesions  Access: None    Basic Metabolic Panel: Recent Labs  Lab 08/28/22 1337 08/29/22 0358 08/29/22 0933 08/29/22 1401 08/29/22 1808 08/30/22 0449  NA 133* 138 141  --   --  138  K 4.6 5.2* 5.7* 5.6* 5.1 4.9  CL 100 108 111  --   --  109  CO2 22 23 25   --   --  25  GLUCOSE 118* 97 102*  --   --  98  BUN 46* 41* 38*  --   --  24*  CREATININE 4.07* 2.37* 2.00*  --   --  1.28*  CALCIUM 8.3* 8.0* 8.2*  --   --  8.2*  MG  --   --   --   --   --  1.6*    Liver Function Tests: Recent Labs  Lab 08/28/22 0420 08/29/22 0358  AST 29 13*  ALT 27 15  ALKPHOS 40 56  BILITOT 2.0* 0.5  PROT 6.4* 6.7  ALBUMIN 3.1* 3.5   Recent Labs  Lab 08/28/22 0420  LIPASE 29   No results for input(s): "AMMONIA" in the last 168 hours.  CBC: Recent Labs  Lab 08/28/22 1337 08/29/22 0358 08/30/22 0449  WBC 13.2* 8.4 5.8  HGB 11.2* 9.9* 8.8*  HCT 35.5* 31.5* 29.0*  MCV 89.0 90.8 92.7  PLT 344  258 251    Cardiac Enzymes: No results for input(s): "CKTOTAL", "CKMB", "CKMBINDEX", "TROPONINI" in the last 168 hours.  BNP: Invalid input(s): "POCBNP"  CBG: No results for input(s): "GLUCAP" in the last 168 hours.  Microbiology: Results for orders placed or performed during the hospital encounter of 08/28/22  Culture, blood (routine x 2)     Status: None (Preliminary result)   Collection Time: 08/28/22  3:46 PM   Specimen: BLOOD RIGHT HAND  Result Value Ref Range Status   Specimen Description BLOOD RIGHT HAND  Final   Special Requests   Final    BOTTLES DRAWN AEROBIC AND ANAEROBIC Blood Culture adequate volume   Culture   Final    NO GROWTH 2 DAYS Performed at [facility], [address] [place], [place] [address]    Report Status PENDING  Incomplete  Culture, blood (routine x 2)     Status: None (Preliminary result)   Collection Time: 08/28/22  4:04 PM   Specimen: BLOOD  Result Value Ref Range Status   Specimen Description BLOOD LEFT ANTECUBITAL  Final   Special Requests  Final    BOTTLES DRAWN AEROBIC AND ANAEROBIC Blood Culture adequate volume   Culture   Final    NO GROWTH 2 DAYS Performed at [facility], [address] [place] [place], [place] [address]    Report Status PENDING  Incomplete    Coagulation Studies: Recent Labs    08/29/22 0358  LABPROT 14.1  INR 1.1    Urinalysis: Recent Labs    08/29/22 0140  COLORURINE YELLOW*  LABSPEC 1.014  PHURINE 5.0  GLUCOSEU NEGATIVE  HGBUR NEGATIVE  BILIRUBINUR NEGATIVE  KETONESUR NEGATIVE  PROTEINUR NEGATIVE  NITRITE NEGATIVE  LEUKOCYTESUR NEGATIVE      Imaging: US Abdomen Complete  Result Date: 08/29/2022 CLINICAL DATA:  Acute renal failure, abdominal distension, elevated LFTs EXAM: ABDOMEN ULTRASOUND COMPLETE COMPARISON:  CT from yesterday FINDINGS: Gallbladder: No gallstones or wall thickening visualized. No sonographic Murphy sign noted by sonographer. Common bile duct: Diameter:  3 x 1 mm. No intrahepatic biliary ductal dilatation. Liver: No focal lesion identified. Within normal limits in parenchymal echogenicity. Portal vein is patent on color Doppler imaging with normal direction of blood flow towards the liver. IVC: No abnormality visualized. Pancreas: Visualized segments unremarkable, portions obscured by overlying bowel gas. Spleen: 10.9 cm craniocaudal length, without focal lesion Right Kidney: Length: 10.4. Echogenicity within normal limits. No mass or hydronephrosis visualized. Left Kidney: Length: 11.6. Echogenicity within normal limits. No mass or hydronephrosis visualized. Abdominal aorta: No aneurysm visualized. Other findings: Technologist describes technically difficult study secondary to bowel gas, patient inability to lay still and take deep inspiration. IMPRESSION: Negative. Electronically Signed   By: Corlis Leak M.D.   On: 08/29/2022 12:22   NM Pulmonary Perfusion  Result Date: 08/28/2022 CLINICAL DATA:  Shortness of breath colon cancer EXAM: NUCLEAR MEDICINE PERFUSION LUNG SCAN TECHNIQUE: Perfusion images were obtained in multiple projections after intravenous injection of radiopharmaceutical. RADIOPHARMACEUTICALS:  4.37 mCi Tc-49m MAA IV COMPARISON:  Chest x-ray 08/28/2022 FINDINGS: There are multiple bilateral wedge-shaped perfusion defects. IMPRESSION: Pulmonary embolism present by Pisaped criteria Critical Value/emergent results were called by telephone at the time of interpretation on 08/28/2022 at 6:31 pm to provider Dr. Scotty Court, Who verbally acknowledged these results. Electronically Signed   By: Jasmine Pang M.D.   On: 08/28/2022 18:31   CT CHEST ABDOMEN PELVIS WO CONTRAST  Result Date: 08/28/2022 CLINICAL DATA:  Respiratory illness. Chest pain 1 week getting worse. EXAM: CT CHEST, ABDOMEN AND PELVIS WITHOUT CONTRAST TECHNIQUE: Multidetector CT imaging of the chest, abdomen and pelvis was performed following the standard protocol without IV contrast.  RADIATION DOSE REDUCTION: This exam was performed according to the departmental dose-optimization program which includes automated exposure control, adjustment of the mA and/or kV according to patient size and/or use of iterative reconstruction technique. COMPARISON:  Chest CT 08/09/2018 and abdominopelvic CT 11/07/2018 FINDINGS: CT CHEST FINDINGS Cardiovascular: Heart is normal size. Thoracic aorta is normal in caliber. Pulmonary arterial system and remaining vascular structures are unremarkable. Mediastinum/Nodes: No mediastinal or hilar adenopathy. Remaining mediastinal structures are normal. Lungs/Pleura: Lungs are adequately inflated as there is centrilobular emphysematous disease present. Focal ground-glass opacification over the posterior right upper lobe. Linear density over the anterior inferior aspect of the right upper lobe likely scarring/atelectasis. Minimal linear density over the right middle lobe compatible with atelectasis/scarring. Linear density over the lingula likely atelectasis/scarring. Minimal patchy peripheral linear density over the anterior and medial left upper lobe likely atelectasis. No effusion. Airways are normal. Musculoskeletal: No focal abnormality. CT ABDOMEN PELVIS FINDINGS Hepatobiliary: Gallbladder, liver and  biliary tree are normal. Pancreas: Normal. Spleen: Normal. Adrenals/Urinary Tract: Adrenal glands are normal. Kidneys are normal in size. No nephrolithiasis. Right parapelvic renal cysts unchanged. Bilateral parapelvic renal cysts unchanged. No follow-up imaging recommended. 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 Ureters and bladder are otherwise unremarkable. Stomach/Bowel: Stomach and small bowel are normal. Findings suggesting previous appendectomy. Surgical suture line over the rectosigmoid junction as the colon is otherwise unremarkable. Vascular/Lymphatic: Minimal calcified plaque over the abdominal aorta which is normal in  caliber. No evidence of adenopathy. Reproductive: Normal. Other: No free fluid or focal inflammatory change. Musculoskeletal: No focal abnormality. IMPRESSION: 1. Focal ground-glass opacification over the posterior right upper lobe which may be due to atypical infectious or inflammatory process. Consider follow-up noncontrast chest CT 4-6 weeks. 2. Scattered areas of linear density over the lungs bilaterally likely atelectasis/scarring. 3. No acute findings in the abdomen/pelvis. 4. Bilateral parapelvic renal cysts unchanged. 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 5. Aortic atherosclerosis. Aortic Atherosclerosis (ICD10-I70.0) and Emphysema (ICD10-J43.9). Electronically Signed   By: Elberta Fortis M.D.   On: 08/28/2022 15:37   DG Chest 2 View  Result Date: 08/28/2022 CLINICAL DATA:  Chest pain. EXAM: CHEST - 2 VIEW COMPARISON:  01/29/2020. FINDINGS: Linear atelectasis or scarring in the lingula. No consolidation or pulmonary edema. Normal heart size and mediastinal contours. No pleural effusion or pneumothorax. IMPRESSION: No evidence of acute cardiopulmonary disease. Electronically Signed   By: Orvan Falconer M.D.   On: 08/28/2022 13:55     Medications:     apixaban  10 mg Oral BID   Followed by   Melene Muller ON 09/06/2022] apixaban  5 mg Oral BID   DULoxetine  60 mg Oral Daily   metoprolol tartrate  25 mg Oral BID   rosuvastatin  20 mg Oral Daily   albuterol, ALPRAZolam, ondansetron (ZOFRAN) IV, oxyCODONE-acetaminophen  Assessment/ Plan:  Sheri Calderon is a 57 y.o.  female with past medical conditions including atrial fibrillation (single episode) hypertension, colon cancer with bowel resection (radiation and chemotherapy 10 years ago) PTSD, bipolar disorder, who was admitted to [facility] 08/28/2022 for Abdominal distension [R14.0] Elevated liver function tests [R79.89] Other ascites [R18.8] AKI (acute kidney injury) (HCC) [N17.9] Acute renal failure,  unspecified acute renal failure type (HCC) [N17.9] Pneumonia of right upper lobe due to infectious organism [J18.9] Acute renal failure superimposed on stage 3a chronic kidney disease (HCC) [N17.9, N18.31]   Acute kidney injury with hyperkalemia appears secondary to ATN from dehydration, increased episodes of diarrhea. Baseline appears to be 0.86 from April 09, 2020. Episodes since that time believed to be acute kidney injuries. CT abdomen pelvis negative for obstruction with stable renal cyst.  - Creatinine has improved with IVF - Will decrease IVF to NS 0.9% at 75ml/hr. May consider stopping tomorrow.  Lab Results  Component Value Date   CREATININE 1.28 (H) 08/30/2022   CREATININE 2.00 (H) 08/29/2022   CREATININE 2.37 (H) 08/29/2022    Intake/Output Summary (Last 24 hours) at 08/30/2022 1235 Last data filed at 08/29/2022 2035 Gross per 24 hour  Intake 480 ml  Output 600 ml  Net -120 ml   2. Anemia of kidney injury Lab Results  Component Value Date   HGB 8.8 (L) 08/30/2022    Hgb decreased. Will monitor for now   LOS: 2   6/30/202412:35 PM

## 2022-08-31 DIAGNOSIS — F1910 Other psychoactive substance abuse, uncomplicated: Secondary | ICD-10-CM

## 2022-08-31 DIAGNOSIS — N170 Acute kidney failure with tubular necrosis: Secondary | ICD-10-CM | POA: Diagnosis not present

## 2022-08-31 DIAGNOSIS — J9601 Acute respiratory failure with hypoxia: Secondary | ICD-10-CM | POA: Diagnosis not present

## 2022-08-31 DIAGNOSIS — I2699 Other pulmonary embolism without acute cor pulmonale: Secondary | ICD-10-CM | POA: Diagnosis not present

## 2022-08-31 LAB — BASIC METABOLIC PANEL
Anion gap: 8 (ref 5–15)
BUN: 17 mg/dL (ref 6–20)
CO2: 25 mmol/L (ref 22–32)
Calcium: 8.8 mg/dL — ABNORMAL LOW (ref 8.9–10.3)
Chloride: 105 mmol/L (ref 98–111)
Creatinine, Ser: 1.15 mg/dL — ABNORMAL HIGH (ref 0.44–1.00)
GFR, Estimated: 56 mL/min — ABNORMAL LOW (ref >60)
Glucose, Bld: 97 mg/dL (ref 70–99)
Potassium: 4.6 mmol/L (ref 3.5–5.1)
Sodium: 138 mmol/L (ref 135–145)

## 2022-08-31 LAB — CBC
HCT: 29.1 % — ABNORMAL LOW (ref 36.0–46.0)
Hemoglobin: 9.3 g/dL — ABNORMAL LOW (ref 12.0–15.0)
MCH: 28.3 pg (ref 26.0–34.0)
MCHC: 32 g/dL (ref 30.0–36.0)
MCV: 88.4 fL (ref 80.0–100.0)
Platelets: 269 10*3/uL (ref 150–400)
RBC: 3.29 MIL/uL — ABNORMAL LOW (ref 3.87–5.11)
RDW: 14.2 % (ref 11.5–15.5)
WBC: 7.2 10*3/uL (ref 4.0–10.5)
nRBC: 0 % (ref 0.0–0.2)

## 2022-08-31 LAB — CULTURE, BLOOD (ROUTINE X 2): Special Requests: ADEQUATE

## 2022-08-31 LAB — PHOSPHORUS: Phosphorus: 2.8 mg/dL (ref 2.5–4.6)

## 2022-08-31 LAB — VITAMIN B12: Vitamin B-12: 102 pg/mL — ABNORMAL LOW (ref 180–914)

## 2022-08-31 LAB — MAGNESIUM: Magnesium: 1.8 mg/dL (ref 1.7–2.4)

## 2022-08-31 MED ORDER — AMLODIPINE BESYLATE 10 MG PO TABS: 10.0000 mg | ORAL_TABLET | Freq: Every day | ORAL | Status: DC

## 2022-08-31 MED ORDER — LABETALOL HCL 5 MG/ML IV SOLN: 20.0000 mg | INTRAVENOUS | Status: DC | PRN

## 2022-08-31 MED ORDER — HYDRALAZINE HCL 20 MG/ML IJ SOLN: 10.0000 mg | Freq: Four times a day (QID) | INTRAMUSCULAR | Status: DC | PRN

## 2022-08-31 NOTE — Discharge Summary (Addendum)
Physician Discharge Summary   Patient: Sheri Calderon MRN: [id number] DOB: August 22, 1965  Admit date:     08/28/2022  Discharge date: 08/31/22  Discharge Physician: Marrion Coy   PCP: Marletta Lor, NP   Recommendations at discharge:   Follow-up with PCP in 1 week. Follow-up on B12 results still pending.  Discharge Diagnoses: Principal Problem:   Acute renal failure superimposed on stage 3a chronic kidney disease (HCC) Active Problems:   Cancer of sigmoid colon (HCC)   Polysubstance abuse (HCC)   Severe manic bipolar 1 disorder with psychotic behavior (HCC)   Paroxysmal atrial fibrillation (HCC)   Primary hypertension   Hyponatremia   Acute pulmonary embolism (HCC)   Acute hypoxemic respiratory failure (HCC)   Hyperkalemia   Hypomagnesemia   Obesity (BMI 30-39.9)  Resolved Problems:   * No resolved hospital problems. *  Hospital Course: Sheri Calderon is a 57 y.o. female with medical history significant of essential hypertension, single episode atrial fibrillation 2012, colon cancer status post bowel resection, radiation and chemotherapy 10 years ago, depression, PTSD, depression, bipolar disorder, who present to the hospital with acute renal failure.  Patient had a 2 weeks history of nausea vomiting diarrhea. Upon arriving the hospital, her blood pressure was low, creatinine was 4.07, VQ scan showed PE. Patient was placed on IV fluids and heparin drip.  Function had improved, anticoagulation changed to oral Eliquis on 6/30.  Assessment and Plan:  Acute hypoxemic respiratory failure secondary to PE. Pulmonary emboli. Patient came to the hospital complaining of shortness of breath, oxygen saturation dropped down to 83% overnight.  Consistent with acute respiratory failure with hypoxemia.  VQ scan showed clear evidence of PE.  Patient is placed on IV heparin. Continue Eliquis as outpatient.  Follow-up with PCP as outpatient.   Acute renal failure superimposed on chronic kidney  disease stage IIIa. Hyperkalemia. Gastroenteritis. Hypomagnesemia. Patient had a significant nausea vomiting diarrhea for 2 weeks prior to admission.  She has severe renal failure at the time of admission. She was treated with normal saline, she developed hyperkalemia with improving renal function.  She be given Lokelma, calcium gluconate and sodium bicarb IV infusion. Creatinine has dropped down to 1.15, close to normal.  Electrolytes normalized.  Medically stable to be discharged.   Acute anemia. Most likely due to acute units and acute renal failure.  Hemoglobin improving to 9.3., no active bleeding.  Iron level normal.  B12 level is still pending.  Essential hypertension. Blood pressure running high again, restart metoprolol and amlodipine.  Hold lisinopril due to acute renal failure with hyperkalemia.     History of colon cancer. Abdominal distention. Abdominal ultrasound did not show any significant changes in the liver and kidney.   Polydrug abuse. Tobacco abuse. Patient drug screen positive for cocaine, marijuana, benzodiazepine, opioids, and Tricyclic.  Advised to quit.           Consultants: Nephrology Procedures performed: None  Disposition: Home Diet recommendation:  Discharge Diet Orders (From admission, onward)     Start     Ordered   08/30/22 0000  Diet - low sodium heart healthy        08/30/22 0931           Cardiac diet DISCHARGE MEDICATION: Allergies as of 08/31/2022       Reactions   Lorazepam Hives, Swelling   Patient reports receiving lorazepam intensol in the hospital and experienced swelling with hives.   Gabapentin Rash  Medication List     STOP taking these medications    Aspirin 81 MG Caps   ezetimibe 10 MG tablet Commonly known as: ZETIA   gemfibrozil 600 MG tablet Commonly known as: LOPID   lisinopril 40 MG tablet Commonly known as: ZESTRIL       TAKE these medications    albuterol 108 (90 Base) MCG/ACT  inhaler Commonly known as: VENTOLIN HFA Inhale 1 puff into the lungs every 6 (six) hours as needed for wheezing or shortness of breath.   ALPRAZolam 0.5 MG tablet Commonly known as: XANAX Take 0.5 mg by mouth 3 (three) times daily as needed for anxiety.   amLODipine 10 MG tablet Commonly known as: NORVASC Take 10 mg by mouth daily.   Apixaban Starter Pack (10mg  and 5mg ) Commonly known as: ELIQUIS STARTER PACK Take as directed on package: start with two-5mg  tablets twice daily for 7 days. On day 8, switch to one-5mg  tablet twice daily.   benzonatate 100 MG capsule Commonly known as: TESSALON Take 100 mg by mouth 2 (two) times daily.   Butalbital-APAP-Caffeine 50-300-40 MG Caps Take 1 tablet by mouth daily.   cyclobenzaprine 10 MG tablet Commonly known as: FLEXERIL Take 10 mg by mouth 3 (three) times daily as needed.   DULoxetine 60 MG capsule Commonly known as: CYMBALTA Take 1 capsule by mouth daily.   metoprolol tartrate 25 MG tablet Commonly known as: LOPRESSOR TAKE 1 TABLET BY MOUTH TWICE A DAY   nitroGLYCERIN 0.4 MG SL tablet Commonly known as: NITROSTAT Place 1 tablet (0.4 mg total) under the tongue every 5 (five) minutes as needed for chest pain.   omeprazole 20 MG capsule Commonly known as: PRILOSEC Take 20 mg by mouth daily.   ondansetron 8 MG tablet Commonly known as: ZOFRAN Take 8 mg by mouth every 8 (eight) hours as needed.   pregabalin 150 MG capsule Commonly known as: LYRICA Take 150 mg by mouth 2 (two) times daily.   prochlorperazine 10 MG tablet Commonly known as: COMPAZINE Take 10 mg by mouth every 8 (eight) hours as needed for nausea or vomiting.   rizatriptan 10 MG tablet Commonly known as: MAXALT Take 10 mg by mouth See admin instructions. Take 10 mg by mouth at onset of headache. Repeat in 2 hours if needed. Maximum 2 tablets in 24 hours.   rosuvastatin 20 MG tablet Commonly known as: CRESTOR Take 1 tablet (20 mg total) by mouth daily.    traMADol 50 MG tablet Commonly known as: ULTRAM Take 50 mg by mouth 2 (two) times daily as needed.        Follow-up Information     Marletta Lor, NP Follow up in 1 week(s).   Specialty: Nurse Practitioner Contact information: [facility] [address] [place] [address] [phone]                Discharge Exam: Ceasar Mons Weights   08/28/22 1854 08/29/22 0224  Weight: 96.6 kg 98.8 kg   General exam: Appears calm and comfortable  Respiratory system: Clear to auscultation. Respiratory effort normal. Cardiovascular system: S1 & S2 heard, RRR. No JVD, murmurs, rubs, gallops or clicks. No pedal edema. Gastrointestinal system: Abdomen is nondistended, soft and nontender. No organomegaly or masses felt. Normal bowel sounds heard. Central nervous system: Alert and oriented. No focal neurological deficits. Extremities: Symmetric 5 x 5 power. Skin: No rashes, lesions or ulcers Psychiatry: Judgement and insight appear normal. Mood & affect appropriate.  Condition at discharge: good  The results of significant diagnostics from this hospitalization (including imaging, microbiology, ancillary and laboratory) are listed below for reference.   Imaging Studies: US Abdomen Complete  Result Date: 08/29/2022 CLINICAL DATA:  Acute renal failure, abdominal distension, elevated LFTs EXAM: ABDOMEN ULTRASOUND COMPLETE COMPARISON:  CT from yesterday FINDINGS: Gallbladder: No gallstones or wall thickening visualized. No sonographic Murphy sign noted by sonographer. Common bile duct: Diameter: 3 x 1 mm. No intrahepatic biliary ductal dilatation. Liver: No focal lesion identified. Within normal limits in parenchymal echogenicity. Portal vein is patent on color Doppler imaging with normal direction of blood flow towards the liver. IVC: No abnormality visualized. Pancreas: Visualized segments unremarkable, portions obscured by overlying bowel gas. Spleen: 10.9 cm  craniocaudal length, without focal lesion Right Kidney: Length: 10.4. Echogenicity within normal limits. No mass or hydronephrosis visualized. Left Kidney: Length: 11.6. Echogenicity within normal limits. No mass or hydronephrosis visualized. Abdominal aorta: No aneurysm visualized. Other findings: Technologist describes technically difficult study secondary to bowel gas, patient inability to lay still and take deep inspiration. IMPRESSION: Negative. Electronically Signed   By: Corlis Leak M.D.   On: 08/29/2022 12:22   NM Pulmonary Perfusion  Result Date: 08/28/2022 CLINICAL DATA:  Shortness of breath colon cancer EXAM: NUCLEAR MEDICINE PERFUSION LUNG SCAN TECHNIQUE: Perfusion images were obtained in multiple projections after intravenous injection of radiopharmaceutical. RADIOPHARMACEUTICALS:  4.37 mCi Tc-58m MAA IV COMPARISON:  Chest x-ray 08/28/2022 FINDINGS: There are multiple bilateral wedge-shaped perfusion defects. IMPRESSION: Pulmonary embolism present by Pisaped criteria Critical Value/emergent results were called by telephone at the time of interpretation on 08/28/2022 at 6:31 pm to provider Dr. Scotty Court, Who verbally acknowledged these results. Electronically Signed   By: Jasmine Pang M.D.   On: 08/28/2022 18:31   CT CHEST ABDOMEN PELVIS WO CONTRAST  Result Date: 08/28/2022 CLINICAL DATA:  Respiratory illness. Chest pain 1 week getting worse. EXAM: CT CHEST, ABDOMEN AND PELVIS WITHOUT CONTRAST TECHNIQUE: Multidetector CT imaging of the chest, abdomen and pelvis was performed following the standard protocol without IV contrast. RADIATION DOSE REDUCTION: This exam was performed according to the departmental dose-optimization program which includes automated exposure control, adjustment of the mA and/or kV according to patient size and/or use of iterative reconstruction technique. COMPARISON:  Chest CT 08/09/2018 and abdominopelvic CT 11/07/2018 FINDINGS: CT CHEST FINDINGS Cardiovascular: Heart is  normal size. Thoracic aorta is normal in caliber. Pulmonary arterial system and remaining vascular structures are unremarkable. Mediastinum/Nodes: No mediastinal or hilar adenopathy. Remaining mediastinal structures are normal. Lungs/Pleura: Lungs are adequately inflated as there is centrilobular emphysematous disease present. Focal ground-glass opacification over the posterior right upper lobe. Linear density over the anterior inferior aspect of the right upper lobe likely scarring/atelectasis. Minimal linear density over the right middle lobe compatible with atelectasis/scarring. Linear density over the lingula likely atelectasis/scarring. Minimal patchy peripheral linear density over the anterior and medial left upper lobe likely atelectasis. No effusion. Airways are normal. Musculoskeletal: No focal abnormality. CT ABDOMEN PELVIS FINDINGS Hepatobiliary: Gallbladder, liver and biliary tree are normal. Pancreas: Normal. Spleen: Normal. Adrenals/Urinary Tract: Adrenal glands are normal. Kidneys are normal in size. No nephrolithiasis. Right parapelvic renal cysts unchanged. Bilateral parapelvic renal cysts unchanged. No follow-up imaging recommended. 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 Ureters and bladder are otherwise unremarkable. Stomach/Bowel: Stomach and small bowel are normal. Findings suggesting previous appendectomy. Surgical suture line over the rectosigmoid junction as the colon is otherwise unremarkable. Vascular/Lymphatic: Minimal calcified plaque over the abdominal aorta  which is normal in caliber. No evidence of adenopathy. Reproductive: Normal. Other: No free fluid or focal inflammatory change. Musculoskeletal: No focal abnormality. IMPRESSION: 1. Focal ground-glass opacification over the posterior right upper lobe which may be due to atypical infectious or inflammatory process. Consider follow-up noncontrast chest CT 4-6 weeks. 2. Scattered areas of linear  density over the lungs bilaterally likely atelectasis/scarring. 3. No acute findings in the abdomen/pelvis. 4. Bilateral parapelvic renal cysts unchanged. 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 5. Aortic atherosclerosis. Aortic Atherosclerosis (ICD10-I70.0) and Emphysema (ICD10-J43.9). Electronically Signed   By: Elberta Fortis M.D.   On: 08/28/2022 15:37   DG Chest 2 View  Result Date: 08/28/2022 CLINICAL DATA:  Chest pain. EXAM: CHEST - 2 VIEW COMPARISON:  01/29/2020. FINDINGS: Linear atelectasis or scarring in the lingula. No consolidation or pulmonary edema. Normal heart size and mediastinal contours. No pleural effusion or pneumothorax. IMPRESSION: No evidence of acute cardiopulmonary disease. Electronically Signed   By: Orvan Falconer M.D.   On: 08/28/2022 13:55    Microbiology: Results for orders placed or performed during the hospital encounter of 08/28/22  Culture, blood (routine x 2)     Status: None (Preliminary result)   Collection Time: 08/28/22  3:46 PM   Specimen: BLOOD RIGHT HAND  Result Value Ref Range Status   Specimen Description BLOOD RIGHT HAND  Final   Special Requests   Final    BOTTLES DRAWN AEROBIC AND ANAEROBIC Blood Culture adequate volume   Culture   Final    NO GROWTH 3 DAYS Performed at [facility], [address] [place], [place] [address]    Report Status PENDING  Incomplete  Culture, blood (routine x 2)     Status: None (Preliminary result)   Collection Time: 08/28/22  4:04 PM   Specimen: BLOOD  Result Value Ref Range Status   Specimen Description BLOOD LEFT ANTECUBITAL  Final   Special Requests   Final    BOTTLES DRAWN AEROBIC AND ANAEROBIC Blood Culture adequate volume   Culture   Final    NO GROWTH 3 DAYS Performed at [facility], [address] [place], [place] [address]    Report Status PENDING  Incomplete    Labs: CBC: Recent Labs  Lab 08/28/22 1337 08/29/22 0358  08/30/22 0449 08/31/22 0540  WBC 13.2* 8.4 5.8 7.2  HGB 11.2* 9.9* 8.8* 9.3*  HCT 35.5* 31.5* 29.0* 29.1*  MCV 89.0 90.8 92.7 88.4  PLT 344 258 251 269   Basic Metabolic Panel: Recent Labs  Lab 08/28/22 1337 08/29/22 0358 08/29/22 0933 08/29/22 1401 08/29/22 1808 08/30/22 0449 08/31/22 0540  NA 133* 138 141  --   --  138 138  K 4.6 5.2* 5.7* 5.6* 5.1 4.9 4.6  CL 100 108 111  --   --  109 105  CO2 22 23 25   --   --  25 25  GLUCOSE 118* 97 102*  --   --  98 97  BUN 46* 41* 38*  --   --  24* 17  CREATININE 4.07* 2.37* 2.00*  --   --  1.28* 1.15*  CALCIUM 8.3* 8.0* 8.2*  --   --  8.2* 8.8*  MG  --   --   --   --   --  1.6* 1.8  PHOS  --   --   --   --   --   --  2.8   Liver Function Tests: Recent Labs  Lab 08/28/22 0420 08/29/22 0358  AST 29 13*  ALT 27 15  ALKPHOS 40 56  BILITOT 2.0* 0.5  PROT 6.4* 6.7  ALBUMIN 3.1* 3.5   CBG: No results for input(s): "GLUCAP" in the last 168 hours.  Discharge time spent: greater than 30 minutes.  Signed: Marrion Coy, MD [facility] 08/31/2022

## 2022-08-31 NOTE — Discharge Instructions (Addendum)
                  Intensive Outpatient Programs  [facility]    [facility] [address]     [address] [place],  [place]     [place], [place] [phone]      [phone]  [facility]   [facility]  (Inpatient and outpatient)  [phone] (Suboxone and Methadone) [address]           [phone]           [facility]    [facility] - Intensive Outpatient [address]     [address] [place], [place] [address]     [place], [place]  [phone]      [phone]  [facility] (Outpatient, Inpatient, [facility]  [facility] (Groups and Residental) (insurance only) [phone]    [place], [place]          [phone]       [facility]    [facility] (for caregivers and family) [address]     [address] [place], [place]     [place], [place] [phone]      [phone]  Residential Treatment Programs  [facility]  [facility](2 years) Residential: 90 days)  [facility] [address]      [address] [place], [place]     [place], [place] [phone]      [phone] or [phone]  [facility]    [facility] [address]      [address] [place], [place]     [place], [place] [phone]      [phone]  [facility]   [facility] ([facility]) [address]     [address] [place], [place] [address]     [place], [place] [phone]      [phone] Admissions: 8am-3pm M-F  [facility]: Residential Program (90 Days)              [facility]: [facility]  [place], [place]     [place],   [phone] or [phone]    (Walk in Hours over the weekend or by referral)   [facility]: [facility]:[phone] (for crisis  response 24 hours a day) 

## 2022-08-31 NOTE — Progress Notes (Signed)
NT informed RN that patient was sitting in the bed rocking back and forth. Care RN walked in and patient stated, she was having a lot of anxiety and became very tearful. PRN anxiety medication given. Total of 30 minutes spent sitting with patient and providing therapeutic listening and positive reinforcement. Pt want to go home and be with her dog who she states is her support for mental health.   RN left room with patient much more relaxed but still mildly upset/sad regarding her family dynamic and relationship with her daughter. Pt stated, she will try to get some rest. Room door left open to help with feeling of walls closing in.

## 2022-08-31 NOTE — Progress Notes (Signed)
Discharged. AVS printed,and reviewed. BP elevated, medications given . Patient refused to stay for recheck. Leaving with husband

## 2022-08-31 NOTE — TOC Progression Note (Signed)
Transition of Care [facility]) - Progression Note    Patient Details  Name: Sheri Calderon MRN: [id number] Date of Birth: 03-15-1965  [facility]) CM/SW Contact  Garret Reddish, RN Phone Number: 08/31/2022, 9:24 AM  Clinical Narrative:   Chart reviewed.  Noted that patient has orders for discharge today. I have added substance abuse resources to patient's discharge instructions.    No other TOC needs noted.           Expected Discharge Plan and Services         Expected Discharge Date: 08/31/22                                     Social Determinants of Health (SDOH) Interventions SDOH Screenings   Food Insecurity: No Food Insecurity (08/29/2022)  Housing: Low Risk  (08/29/2022)  Transportation Needs: No Transportation Needs (08/29/2022)  Utilities: Not At Risk (08/29/2022)  Alcohol Screen: Low Risk  (01/24/2020)  Tobacco Use: High Risk (08/28/2022)    Readmission Risk Interventions     No data to display

## 2022-09-02 LAB — CULTURE, BLOOD (ROUTINE X 2): Special Requests: ADEQUATE

## 2022-10-07 ENCOUNTER — Telehealth: Payer: Self-pay | Admitting: Adult Health

## 2022-10-07 NOTE — Telephone Encounter (Signed)
Called number provided. Left message to call office. Patient has not established with our office. We will not be able to fill out any forms as we have never seen patient in our office.

## 2022-10-07 NOTE — Telephone Encounter (Signed)
Patient called the office regarding clearance for dental surgery, states her dentist office told her they contacted [place] for clearance for extractions they will be doing tomorrow. I could not find record of this, patient is asking if Sheri Calderon might be able to sign a clearance for her to have the dental work done. Says she has been off blood thinners for 2 weeks and is just needing clearance for the procedure. Patient can be reached at [phone]

## 2022-10-08 NOTE — Telephone Encounter (Signed)
Contacted pt and let her know that the form cannot be filled out without being an established patient first.

## 2022-10-08 NOTE — Telephone Encounter (Signed)
Detailed vm left informing of note below looks like pt has an appt made with cable 11-12-22

## 2022-11-12 ENCOUNTER — Ambulatory Visit: Payer: Medicare PPO | Admitting: Nurse Practitioner

## 2022-11-13 ENCOUNTER — Inpatient Hospital Stay: Payer: Medicare PPO | Admitting: Internal Medicine

## 2022-11-13 ENCOUNTER — Inpatient Hospital Stay: Payer: Medicare PPO

## 2022-11-25 NOTE — Progress Notes (Signed)
[facility] CONSULT NOTE  Patient Care Team: Marletta Lor, NP as PCP - General (Nurse Practitioner) Michaelyn Barter, MD as Consulting Physician (Oncology)  ASSESSMENT & PLAN:   57 year old woman with history of drug use, atrial fibrillation, remote history of colon cancer, hypertension, referred here for pulmonary embolism.  Patient had a long car ride about 6 hours without much prior to her diagnosis.  She is also a smoker.  She had never developed VTE during the time she received chemotherapy for colon cancer.  Of note, records from hospital drug screen showed positive for cocaine.  V[place]ka denies use any.  We discussed the importance of not using cocaine.  Report of risk of myocardial infarction like arterial thrombosis as well as venous thrombosis.  Sudden death may occur.  Patient reports she stopped her apixaban herself about a week later.  She has not experienced any chest pain was resolved while inpatient.  Oxygen saturation was 99% today.  History of PE First episode: provoked post long drive Treatment: apixaban for 1 week and self stopped. No new symptoms Setting: Patient drug screen positive for cocaine, marijuana, benzodiazepine, opioids, and Tricyclic.  Recommend stop smoking. No cocaine. Watch for new symptoms of PE or DVT. Precautions given If developing new VTE, will need to re-evaluated Since patient has no symptoms of PE or DVT and self-discontinued apixaban in July, there is no clear indication to resume anticoagulation this time. Proceed with dental procedure as indicated.  B12 deficiency She has been taking B12 once daily for a few months Check b12, folate  If developing new DVT or PE, then will need to be on anticoagulation again. I counseled patient on smoking cessation.  Patient education for risk factors and prevention of clotting We talked about modifiable risk factors.  Prevention of clotting like deep vein thrombosis including: Strong risk  factors including fractures of lower limb, hospitalization for severe illness, such as heart failure, myocardial infarction, spinal cord injury, major trauma, hip or knee replacement, and previous VTE. avoid prolonged immobilization and moving extremities every 1-2 hours during long car rides or flights.  Taking a break and moving extremities if working in a job setting with prolonged sitting.   Avoid dehydration, especially in high altitude. Avoid cigarette smoking Avoid use of hormone replacement therapy, estrogen-containing contraceptive in women.   Maintaining healthy lifestyle to prevent development of diabetes.  Weight loss if BMI over 30.  Regular exercises but not extreme.   Other risk factors for clotting are surgery, hospitalization, inflammatory disease or severe infection, trauma or injuries from inflammatory state and stasis. If developing one-sided leg swelling, pain, color change, chest pain, sudden short of breath, difficulty taking deep breaths, taking deep breath with chest discomfort or pain, dizziness or heart racing sensation, go to the emergency room immediately for evaluation. Avoid NSAIDs, aspirin while on blood thinner.  Discussed bleeding precautions and avoid high risk activities for falling while on anticoagulation. Anticoagulants do not cause bleeding.  Rather, if bleeding occurs when one is on anticoagulation, it may take longer for the bleeding to stop due to reduce coagulation capacity.  I reviewed with the patient about the plan for care for DVT/PE.  Hypertension from vital signs.  Asymptomatic.  Patient will follow-up with PCP.  Finally, at the end of our consultation today, I reinforced the importance of preventive strategies such as avoiding hormonal supplement, avoiding cigarette smoking, keeping up-to-date with screening programs for early cancer detection, frequent ambulation for long distance travel and aggressive  DVT prophylaxis in all surgical  settings.  Orders Placed This Encounter  Procedures   CBC with Differential (Cancer Center Only)    Standing Status:   Future    Standing Expiration Date:   11/27/2023   Folate    Standing Status:   Future    Standing Expiration Date:   11/27/2023   Ferritin    Standing Status:   Future    Standing Expiration Date:   11/27/2023   Vitamin B12    Standing Status:   Future    Standing Expiration Date:   11/27/2023    All questions were answered. The patient knows to call the clinic with any problems, questions or concerns.   Melven Sartorius, MD 9/27/202410:54 AM  CHIEF COMPLAINTS/PURPOSE OF CONSULTATION:  History of PE  HISTORY OF PRESENTING ILLNESS:  Sheri Calderon 57 y.o. female history of Stage IIIC (T4N2M0) adenocarcinoma of the sigmoid colon --treated with chemotherapy and radiation therapy in 2014 is here because of PE.  08/28/22 presented to the ED complaining of lower back pain and chest pain. She describes it as a sharp pain that is worse when taking a deep breath.  08/28/22 VQ scan FINDINGS: There are multiple bilateral wedge-shaped perfusion defects. IMPRESSION: Pulmonary embolism present by Pisaped criteria  08/27/25 CT CHEST, ABDOMEN AND PELVIS WITHOUT CONTRAST  1. Focal ground-glass opacification over the posterior right upper lobe which may be due to atypical infectious or inflammatory process. Consider follow-up noncontrast chest CT 4-6 weeks. 2. Scattered areas of linear density over the lungs bilaterally likely atelectasis/scarring. 3. No acute findings in the abdomen/pelvis. 4. Bilateral parapelvic renal cysts unchanged. 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 5. Aortic atherosclerosis.  Patient drug screen positive for cocaine, marijuana, benzodiazepine, opioids, and Tricyclic.  Patient was placed on IV fluids and heparin drip. Function had improved, anticoagulation changed to oral Eliquis on 6/30.   She took herself  off apixaban for about a week after discharged and thought she needs dental work.  She has no new symptoms.  She reports the chest pain and back pain resolved quickly after admission before charge. She denies short of breath or more chest pain after stop apixaban.  No trauma, sickness, illness, but had a 6 hour long car ride about April-May. No break. No leg swelling or pain.  No family history of PE or DVT.  She smokes cigarette. She denies using cocaine.    She is not a vegetarian. She does not eat healthy.  MEDICAL HISTORY:  Past Medical History:  Diagnosis Date   Allergic rhinitis    Anxiety    Atrial fibrillation (HCC)    ~ 06/2010 single episdose, evaluted at [facility] with testing. No reccurence. Does not see cardiologist (08/01/2018)   Colon cancer [facility]) 2014   cancer of the sigmoid colon   Depression    Diverticulitis    "this is my 2nd time in hospital w/this in the last 2 wk" (06/21/2012)   Dysrhythmia    1 time issue with A. Fib. In the hospital for 1 week. Spontaneously convereted to NSR.   GERD (gastroesophageal reflux disease)    Gout    Hypertension    Lower extremity edema    Migraines    Osteoarthritis    Peripheral neuropathy    PTSD (post-traumatic stress disorder)    Restless legs syndrome    S/P chemotherapy, time since less than 4 weeks    Tremor, essential  SURGICAL HISTORY: Past Surgical History:  Procedure Laterality Date   APPENDECTOMY     CARDIOVASCULAR STRESS TEST     06/24/10 [facility]): No ischemia, EF 53%.   COLONOSCOPY N/A 08/01/2017   Procedure: COLONOSCOPY;  Surgeon: Toney Reil, MD;  Location: [facility] ENDOSCOPY;  Service: Gastroenterology;  Laterality: N/A;   INCISIONAL HERNIA REPAIR  08/03/2018   INCISIONAL HERNIA REPAIR N/A 08/03/2018   Procedure: LAPAROSCOPIC INCISIONAL HERNIA REPAIR WITH MESH;  Surgeon: Abigail Miyamoto, MD;  Location: [facility] OR;  Service: General;  Laterality: N/A;   LEFT HEART CATH AND CORONARY  ANGIOGRAPHY N/A 04/09/2020   Procedure: LEFT HEART CATH AND CORONARY ANGIOGRAPHY;  Surgeon: Elder Negus, MD;  Location: [facility] INVASIVE CV LAB;  Service: Cardiovascular;  Laterality: N/A;   PARTIAL COLECTOMY N/A 06/28/2012   Procedure: PARTIAL COLECTOMY;  Surgeon: Shelly Rubenstein, MD;  Location: [facility] OR;  Service: General;  Laterality: N/A;   PORTACATH PLACEMENT N/A 07/11/2012   Procedure: INSERTION PORT-A-CATH;  Surgeon: Shelly Rubenstein, MD;  Location: [facility] ORS;  Service: General;  Laterality: N/A;   portacath removal     TRANSTHORACIC ECHOCARDIOGRAM     06/22/10 [facility]): LVEF 55%. Normal LA size. Mild MR/TR. Trace PI.   TUBAL LIGATION  ~ 1989    SOCIAL HISTORY: Social History   Socioeconomic History   Marital status: Married    Spouse name: Todd   Number of children: 1   Years of education: Not on file   Highest education level: Not on file  Occupational History    Employer: [employer]    Comment: Holiday representative  Tobacco Use   Smoking status: Every Day    Current packs/day: 0.20    Average packs/day: 0.2 packs/day for 31.9 years (6.4 ttl pk-yrs)    Types: Cigarettes    Start date: 01/17/1991   Smokeless tobacco: Never  Vaping Use   Vaping status: Never Used  Substance and Sexual Activity   Alcohol use: No    Alcohol/week: 0.0 standard drinks of alcohol   Drug use: No   Sexual activity: Yes    Birth control/protection: Surgical  Other Topics Concern   Not on file  Social History Narrative   Married w/grown daughter born 30 - 1 grandson born 2009   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 [employer]   Husband works out of town frequently   4 caffeine drinks ([place]) qd   10/29/2014 update   Social Determinants of Health   Financial Resource Strain: Not on file  Food Insecurity: No Food Insecurity (08/29/2022)   Hunger Vital Sign    Worried About Running Out of Food in the Last Year: Never true     Ran Out of Food in the Last Year: Never true  Transportation Needs: No Transportation Needs (08/29/2022)   PRAPARE - Administrator, Civil Service (Medical): No    Lack of Transportation (Non-Medical): No  Physical Activity: Not on file  Stress: Not on file  Social Connections: Unknown (11/04/2022)   Received from [facility]   Social Network    Social Network: Not on file  Intimate Partner Violence: Unknown (11/04/2022)   Received from [facility]   HITS    Physically Hurt: Not on file    Insult or Talk Down To: Not on file    Threaten Physical Harm: Not on file    Scream or Curse: Not on file    FAMILY HISTORY:  Family History  Problem Relation Age of Onset   Diabetes Mother    Heart disease Mother    Stomach cancer Mother    Ovarian cancer Mother 49   COPD Father    Hypertension Father    Hyperlipidemia Father    Colon polyps Father        2 lifetime colon polyps   Down syndrome Paternal Aunt    Cancer Paternal Uncle        cancer in the spine   COPD Maternal Grandmother    Stomach cancer Paternal Grandmother    Vascular Disease Brother    Aneurysm Brother     ALLERGIES:  is allergic to lorazepam and gabapentin.  MEDICATIONS:  Current Outpatient Medications  Medication Sig Dispense Refill   DULoxetine (CYMBALTA) 60 MG capsule Take 1 capsule by mouth daily.     ezetimibe (ZETIA) 10 MG tablet Take 10 mg by mouth daily.     lisinopril (ZESTRIL) 40 MG tablet Take 40 mg by mouth daily.     albuterol (VENTOLIN HFA) 108 (90 Base) MCG/ACT inhaler Inhale 1 puff into the lungs every 6 (six) hours as needed for wheezing or shortness of breath.     ALPRAZolam (XANAX) 0.5 MG tablet Take 0.5 mg by mouth 3 (three) times daily as needed for anxiety.  2   APIXABAN (ELIQUIS) VTE STARTER PACK (10MG  AND 5MG ) Take as directed on package: start with two-5mg  tablets twice daily for 7 days. On day 8, switch to one-5mg  tablet twice daily. (Patient not taking: Reported on  11/27/2022) 74 each 0   cyclobenzaprine (FLEXERIL) 10 MG tablet Take 10 mg by mouth 3 (three) times daily as needed.     ELIQUIS 5 MG TABS tablet Take 5 mg by mouth 2 (two) times daily. (Patient not taking: Reported on 11/27/2022)     metoprolol tartrate (LOPRESSOR) 25 MG tablet TAKE 1 TABLET BY MOUTH TWICE A DAY 180 tablet 1   nitroGLYCERIN (NITROSTAT) 0.4 MG SL tablet Place 1 tablet (0.4 mg total) under the tongue every 5 (five) minutes as needed for chest pain. 90 tablet 3   omeprazole (PRILOSEC) 20 MG capsule Take 20 mg by mouth daily.     ondansetron (ZOFRAN) 8 MG tablet Take 8 mg by mouth every 8 (eight) hours as needed.     pregabalin (LYRICA) 150 MG capsule Take 150 mg by mouth 2 (two) times daily.     prochlorperazine (COMPAZINE) 10 MG tablet Take 10 mg by mouth every 8 (eight) hours as needed for nausea or vomiting.     rizatriptan (MAXALT) 10 MG tablet Take 10 mg by mouth See admin instructions. Take 10 mg by mouth at onset of headache. Repeat in 2 hours if needed. Maximum 2 tablets in 24 hours.  3   rosuvastatin (CRESTOR) 20 MG tablet Take 1 tablet (20 mg total) by mouth daily. 90 tablet 3   traMADol (ULTRAM) 50 MG tablet Take 50 mg by mouth 2 (two) times daily as needed.     No current facility-administered medications for this visit.    REVIEW OF SYSTEMS:   Constitutional: Denies fevers Respiratory: Denies cough, shortness of breath or wheezes Cardiovascular: Denies palpitation, chest discomfort or heart racing sensation Gastrointestinal:  Denies abdominal pain  PHYSICAL EXAMINATION:  Vitals:   11/27/22 1012  BP: (!) 199/106  Pulse: 67  Resp: 18  Temp: 98.1 F (36.7 C)  SpO2: 99%   Filed Weights   11/27/22 1012  Weight: 206 lb 12.8  oz (93.8 kg)    GENERAL: alert, no distress and comfortable SKIN: skin color normal LUNGS: Effort normal and no respiratory distress.  Able to take deep breath without chest pain HEART: Pulse normal Musculoskeletal: no lower extremity  edema  LABORATORY DATA:  I have reviewed the data as listed   RADIOGRAPHIC STUDIES: I have personally reviewed the radiological images as listed and agreed with the findings in the report. No results found.

## 2022-11-27 ENCOUNTER — Inpatient Hospital Stay: Payer: Commercial Managed Care - PPO

## 2022-11-27 VITALS — BP 199/106 | HR 67 | Temp 98.1°F | Resp 18 | Wt 206.8 lb

## 2022-11-27 DIAGNOSIS — Z8349 Family history of other endocrine, nutritional and metabolic diseases: Secondary | ICD-10-CM | POA: Diagnosis not present

## 2022-11-27 DIAGNOSIS — F1910 Other psychoactive substance abuse, uncomplicated: Secondary | ICD-10-CM | POA: Insufficient documentation

## 2022-11-27 DIAGNOSIS — Z888 Allergy status to other drugs, medicaments and biological substances status: Secondary | ICD-10-CM | POA: Diagnosis not present

## 2022-11-27 DIAGNOSIS — F17210 Nicotine dependence, cigarettes, uncomplicated: Secondary | ICD-10-CM | POA: Insufficient documentation

## 2022-11-27 DIAGNOSIS — Z923 Personal history of irradiation: Secondary | ICD-10-CM | POA: Diagnosis not present

## 2022-11-27 DIAGNOSIS — Z85038 Personal history of other malignant neoplasm of large intestine: Secondary | ICD-10-CM | POA: Diagnosis not present

## 2022-11-27 DIAGNOSIS — Z800 Family history of malignant neoplasm of digestive organs: Secondary | ICD-10-CM | POA: Diagnosis not present

## 2022-11-27 DIAGNOSIS — Z79899 Other long term (current) drug therapy: Secondary | ICD-10-CM | POA: Diagnosis not present

## 2022-11-27 DIAGNOSIS — Z7901 Long term (current) use of anticoagulants: Secondary | ICD-10-CM | POA: Diagnosis not present

## 2022-11-27 DIAGNOSIS — N281 Cyst of kidney, acquired: Secondary | ICD-10-CM | POA: Diagnosis not present

## 2022-11-27 DIAGNOSIS — Z833 Family history of diabetes mellitus: Secondary | ICD-10-CM | POA: Insufficient documentation

## 2022-11-27 DIAGNOSIS — Z9049 Acquired absence of other specified parts of digestive tract: Secondary | ICD-10-CM | POA: Diagnosis not present

## 2022-11-27 DIAGNOSIS — I10 Essential (primary) hypertension: Secondary | ICD-10-CM | POA: Insufficient documentation

## 2022-11-27 DIAGNOSIS — Z9221 Personal history of antineoplastic chemotherapy: Secondary | ICD-10-CM | POA: Insufficient documentation

## 2022-11-27 DIAGNOSIS — Z8249 Family history of ischemic heart disease and other diseases of the circulatory system: Secondary | ICD-10-CM | POA: Diagnosis not present

## 2022-11-27 DIAGNOSIS — I700 Atherosclerosis of aorta: Secondary | ICD-10-CM | POA: Insufficient documentation

## 2022-11-27 DIAGNOSIS — F13939 Sedative, hypnotic or anxiolytic use, unspecified with withdrawal, unspecified: Secondary | ICD-10-CM | POA: Insufficient documentation

## 2022-11-27 DIAGNOSIS — Z86711 Personal history of pulmonary embolism: Secondary | ICD-10-CM | POA: Diagnosis not present

## 2022-11-27 DIAGNOSIS — J984 Other disorders of lung: Secondary | ICD-10-CM | POA: Insufficient documentation

## 2022-11-27 DIAGNOSIS — F1290 Cannabis use, unspecified, uncomplicated: Secondary | ICD-10-CM | POA: Diagnosis not present

## 2022-11-27 DIAGNOSIS — E538 Deficiency of other specified B group vitamins: Secondary | ICD-10-CM

## 2022-11-27 DIAGNOSIS — Z83719 Family history of colon polyps, unspecified: Secondary | ICD-10-CM | POA: Insufficient documentation

## 2022-11-27 DIAGNOSIS — Z8041 Family history of malignant neoplasm of ovary: Secondary | ICD-10-CM | POA: Insufficient documentation

## 2022-11-27 DIAGNOSIS — Z83438 Family history of other disorder of lipoprotein metabolism and other lipidemia: Secondary | ICD-10-CM | POA: Diagnosis not present

## 2022-11-27 DIAGNOSIS — D649 Anemia, unspecified: Secondary | ICD-10-CM

## 2022-11-27 NOTE — Patient Instructions (Addendum)
History of blood clot in the lung First episode: provoked post long drive Treatment: apixaban for 1 week and self stopped. No new symptoms Setting: Patient drug screen positive for cocaine, marijuana, benzodiazepine, opioids, and Tricyclic.  Recommend stop smoking. No cocaine. Watch for new symptoms of PE or DVT. Precautions given If developing new clot, will need to re-evaluated Since patient has no symptoms of PE or DVT and self-discontinued apixaban in July, there is no clear indication to resume anticoagulation this time. Proceed with dental procedure as indicated.  Patient education for risk factors and prevention of clotting We talked about modifiable risk factors.  Prevention of clotting like deep vein thrombosis including: Strong risk factors including fractures of lower limb, hospitalization for severe illness, such as heart failure, myocardial infarction, spinal cord injury, major trauma, hip or knee replacement, and previous VTE. avoid prolonged immobilization and moving extremities every 1-2 hours during long car rides or flights.  Taking a break and moving extremities if working in a job setting with prolonged sitting.   Avoid dehydration, especially in high altitude. Avoid cigarette smoking Avoid use of hormone replacement therapy, estrogen-containing contraceptive in women.   Maintaining healthy lifestyle to prevent development of diabetes.  Weight loss if BMI over 30.  Regular exercises but not extreme.   Other risk factors for clotting are surgery, hospitalization, inflammatory disease or severe infection, trauma or injuries from inflammatory state and stasis. If developing one-sided leg swelling, pain, color change, chest pain, sudden short of breath, difficulty taking deep breaths, taking deep breath with chest discomfort or pain, dizziness or heart racing sensation, go to the emergency room immediately for evaluation. Avoid NSAIDs, aspirin while on blood thinner.

## 2023-02-09 ENCOUNTER — Other Ambulatory Visit: Payer: Self-pay | Admitting: Cardiology

## 2023-02-09 DIAGNOSIS — I10 Essential (primary) hypertension: Secondary | ICD-10-CM

## 2023-02-09 DIAGNOSIS — I480 Paroxysmal atrial fibrillation: Secondary | ICD-10-CM

## 2023-02-22 ENCOUNTER — Emergency Department (HOSPITAL_BASED_OUTPATIENT_CLINIC_OR_DEPARTMENT_OTHER): Payer: Commercial Managed Care - PPO | Admitting: Radiology

## 2023-02-22 ENCOUNTER — Encounter (HOSPITAL_BASED_OUTPATIENT_CLINIC_OR_DEPARTMENT_OTHER): Payer: Self-pay | Admitting: Emergency Medicine

## 2023-02-22 ENCOUNTER — Emergency Department (HOSPITAL_BASED_OUTPATIENT_CLINIC_OR_DEPARTMENT_OTHER): Payer: Commercial Managed Care - PPO

## 2023-02-22 ENCOUNTER — Other Ambulatory Visit: Payer: Self-pay | Admitting: Cardiology

## 2023-02-22 ENCOUNTER — Emergency Department (HOSPITAL_BASED_OUTPATIENT_CLINIC_OR_DEPARTMENT_OTHER)
Admission: EM | Admit: 2023-02-22 | Discharge: 2023-02-22 | Disposition: A | Discharge status: Home / Self Care | Payer: Commercial Managed Care - PPO | Attending: Emergency Medicine | Admitting: Emergency Medicine

## 2023-02-22 ENCOUNTER — Other Ambulatory Visit: Payer: Self-pay

## 2023-02-22 DIAGNOSIS — Z7901 Long term (current) use of anticoagulants: Secondary | ICD-10-CM | POA: Insufficient documentation

## 2023-02-22 DIAGNOSIS — M25571 Pain in right ankle and joints of right foot: Secondary | ICD-10-CM | POA: Diagnosis not present

## 2023-02-22 DIAGNOSIS — Z85038 Personal history of other malignant neoplasm of large intestine: Secondary | ICD-10-CM | POA: Diagnosis not present

## 2023-02-22 DIAGNOSIS — Z79899 Other long term (current) drug therapy: Secondary | ICD-10-CM | POA: Diagnosis not present

## 2023-02-22 DIAGNOSIS — I10 Essential (primary) hypertension: Secondary | ICD-10-CM | POA: Diagnosis not present

## 2023-02-22 DIAGNOSIS — M79621 Pain in right upper arm: Secondary | ICD-10-CM | POA: Insufficient documentation

## 2023-02-22 DIAGNOSIS — I480 Paroxysmal atrial fibrillation: Secondary | ICD-10-CM

## 2023-02-22 LAB — CBC
HCT: 36.4 % (ref 36.0–46.0)
Hemoglobin: 11.9 g/dL — ABNORMAL LOW (ref 12.0–15.0)
MCH: 28.7 pg (ref 26.0–34.0)
MCHC: 32.7 g/dL (ref 30.0–36.0)
MCV: 87.7 fL (ref 80.0–100.0)
Platelets: 321 10*3/uL (ref 150–400)
RBC: 4.15 MIL/uL (ref 3.87–5.11)
RDW: 13.6 % (ref 11.5–15.5)
WBC: 6.3 10*3/uL (ref 4.0–10.5)
nRBC: 0 % (ref 0.0–0.2)

## 2023-02-22 LAB — BASIC METABOLIC PANEL
Anion gap: 6 (ref 5–15)
BUN: 15 mg/dL (ref 6–20)
CO2: 31 mmol/L (ref 22–32)
Calcium: 9.4 mg/dL (ref 8.9–10.3)
Chloride: 103 mmol/L (ref 98–111)
Creatinine, Ser: 0.93 mg/dL (ref 0.44–1.00)
GFR, Estimated: >60 mL/min (ref >60)
Glucose, Bld: 90 mg/dL (ref 70–99)
Potassium: 4 mmol/L (ref 3.5–5.1)
Sodium: 140 mmol/L (ref 135–145)

## 2023-02-22 MED ORDER — OXYCODONE-ACETAMINOPHEN 5-325 MG PO TABS
1.0000 | ORAL_TABLET | ORAL | Status: DC | PRN
Start: 2023-02-22 — End: 2023-02-22
  Administered 2023-02-22: 1 via ORAL
  Filled 2023-02-22: qty 1

## 2023-02-22 MED ORDER — METOCLOPRAMIDE HCL 5 MG/ML IJ SOLN
10.0000 mg | Freq: Once | INTRAMUSCULAR | Status: AC
  Administered 2023-02-22: 10 mg via INTRAVENOUS
  Filled 2023-02-22: qty 2

## 2023-02-22 MED ORDER — KETOROLAC TROMETHAMINE 15 MG/ML IJ SOLN
15.0000 mg | Freq: Once | INTRAMUSCULAR | Status: AC
  Administered 2023-02-22: 15 mg via INTRAVENOUS
  Filled 2023-02-22: qty 1

## 2023-02-22 NOTE — ED Triage Notes (Signed)
Had 2 metal shelves fall on her. At [employer] Happened on Friday. Headache, right arm and right ankle  Not relieved with migraine meds and motrin

## 2023-02-22 NOTE — ED Notes (Signed)
Pt advised this RN that she is ready to leave. EDP advised, to reassess

## 2023-02-22 NOTE — ED Provider Notes (Signed)
[facility] AT [facility] Provider Note   CSN: [id number] Arrival date & time: 02/22/23  1138     History  Chief Complaint  Patient presents with   Headache   Injury   HPI Sheri Calderon is a 57 y.o. female with history of colon cancer, multiple DVTs, atrial fibrillation, hypertension presenting for right arm and right ankle injury that occurred on Friday and a headache.  States she was at work when 2 metal shelves fell and struck her right upper arm and right ankle. States she has had pain in both those locations but the arm does hurt considerably.  States she did not hit her head or lose consciousness.  Patient does report history of DVTs not currently anticoagulated. States she has been off anticoagulation for 3 months now. Still able to ambulate and bear weight on the right ankle. She also states that she is "having a migraine".  This also started Friday night.  Endorses associated phonophobia and photophobia.  Denies fever and nuchal rigidity.   Headache Injury Associated symptoms include headaches.       Home Medications Prior to Admission medications   Medication Sig Start Date End Date Taking? Authorizing Provider  albuterol (VENTOLIN HFA) 108 (90 Base) MCG/ACT inhaler Inhale 1 puff into the lungs every 6 (six) hours as needed for wheezing or shortness of breath.    [provider]  ALPRAZolam Prudy Feeler) 0.5 MG tablet Take 0.5 mg by mouth 3 (three) times daily as needed for anxiety. 07/18/17   [provider]  APIXABAN Everlene Balls) VTE STARTER PACK (10MG  AND 5MG ) Take as directed on package: start with two-5mg  tablets twice daily for 7 days. On day 8, switch to one-5mg  tablet twice daily. Patient not taking: Reported on 11/27/2022 08/30/22   Marrion Coy, MD  cyclobenzaprine (FLEXERIL) 10 MG tablet Take 10 mg by mouth 3 (three) times daily as needed. 07/16/22   [provider]  DULoxetine (CYMBALTA) 60 MG capsule Take 1 capsule  by mouth daily.    [provider]  ELIQUIS 5 MG TABS tablet Take 5 mg by mouth 2 (two) times daily. Patient not taking: Reported on 11/27/2022 10/08/22   [provider]  ezetimibe (ZETIA) 10 MG tablet Take 10 mg by mouth daily. 11/22/22   [provider]  lisinopril (ZESTRIL) 40 MG tablet Take 40 mg by mouth daily. 11/04/22   [provider]  metoprolol tartrate (LOPRESSOR) 25 MG tablet TAKE 1 TABLET BY MOUTH TWICE A DAY 02/10/23   Patwardhan, Manish J, MD  nitroGLYCERIN (NITROSTAT) 0.4 MG SL tablet Place 1 tablet (0.4 mg total) under the tongue every 5 (five) minutes as needed for chest pain. 03/06/20 08/28/22  Cantwell, Celeste C, PA-C  omeprazole (PRILOSEC) 20 MG capsule Take 20 mg by mouth daily.    [provider]  ondansetron (ZOFRAN) 8 MG tablet Take 8 mg by mouth every 8 (eight) hours as needed. 08/04/22   [provider]  pregabalin (LYRICA) 150 MG capsule Take 150 mg by mouth 2 (two) times daily.    [provider]  prochlorperazine (COMPAZINE) 10 MG tablet Take 10 mg by mouth every 8 (eight) hours as needed for nausea or vomiting.    [provider]  rizatriptan (MAXALT) 10 MG tablet Take 10 mg by mouth See admin instructions. Take 10 mg by mouth at onset of headache. Repeat in 2 hours if needed. Maximum 2 tablets in 24 hours. 06/10/17   [provider]  rosuvastatin (CRESTOR) 20 MG tablet Take 1 tablet (20 mg total) by mouth daily. 03/06/20 08/28/22  Cantwell, Celeste C, PA-C  traMADol (ULTRAM) 50 MG tablet Take 50 mg by mouth 2 (two) times daily as needed. 07/10/22   [provider]      Allergies    Lorazepam and Gabapentin    Review of Systems   Review of Systems  Neurological:  Positive for headaches.    Physical Exam   Vitals:   02/22/23 1150 02/22/23 1415  BP: (!) 152/100 (!) 159/92  Pulse: 75 74  Resp: 19 17  Temp: (!) 97.4 F (36.3 C)   SpO2: 94% 92%    CONSTITUTIONAL:  well-appearing,  NAD NEURO: GCS 15. Speech is goal oriented. No deficits appreciated to CN III-XII; symmetric eyebrow raise, no facial drooping, tongue midline. Patient has equal grip strength bilaterally with 5/5 strength against resistance in all major muscle groups bilaterally. Sensation to light touch intact. Patient moves extremities without ataxia. Normal finger-nose-finger. Patient ambulatory with steady gait. EYES:  eyes equal and reactive ENT/NECK:  Supple, no stridor CARDIO:  Regular rhythm, appears well-perfused, pedal pulses 2+ bilaterally. PULM:  No respiratory distress,  GI/GU:  non-distended MSK/SPINE:  No gross deformities, no edema, moves all extremities.  Large area of ecchymosis noted in the right bicep extending to the tricep.  It is tender to touch.  No edema noted.  Right ankle appears normal, range of motion was limited due to pain. SKIN:  no rash, atraumatic  *Additional and/or pertinent findings included in MDM below   ED Results / Procedures / Treatments   Labs (all labs ordered are listed, but only abnormal results are displayed) Labs Reviewed  CBC - Abnormal; Notable for the following components:      Result Value   Hemoglobin 11.9 (*)    All other components within normal limits  BASIC METABOLIC PANEL    EKG None  Radiology US Venous Img Upper Right (DVT Study) Result Date: 02/22/2023 CLINICAL DATA:  RIGHT arm injury.  Trauma EXAM: RIGHT UPPER EXTREMITY VENOUS DOPPLER ULTRASOUND TECHNIQUE: Gray-scale sonography with graded compression, as well as color Doppler and duplex ultrasound were performed to evaluate the upper extremity deep venous system from the level of the subclavian vein and including the jugular, axillary, basilic, radial, ulnar and upper cephalic vein. Spectral Doppler was utilized to evaluate flow at rest and with distal augmentation maneuvers. COMPARISON:  None Available. FINDINGS: VENOUS Normal compressibility of the RIGHT internal jugular, subclavian,  axillary, cephalic, basilic, brachial, radial and ulnar veins. No filling defects to suggest DVT on grayscale or color Doppler imaging. Doppler waveforms show normal direction of venous flow, normal respiratory plasticity and response to augmentation. Limited views of the contralateral subclavian vein are unremarkable. OTHER No evidence of superficial thrombophlebitis or abnormal fluid collection. Limitations: none IMPRESSION: No evidence of DVT or superficial thrombophlebitis within the RIGHT upper extremity. Roanna Banning, MD [facility] Radiology Electronically Signed   By: Roanna Banning M.D.   On: 02/22/2023 15:05   DG Ankle Complete Right Result Date: 02/22/2023 CLINICAL DATA:  Trauma, swelling. EXAM: RIGHT ANKLE - COMPLETE 3 VIEW COMPARISON:  None Available. FINDINGS: Well corticated osseous fragment consistent with an old medial malleolus fracture. Soft tissue swelling of the lateral malleolus. Plantar calcaneal spur. No acute osseous abnormalities. IMPRESSION: Soft tissue swelling. Old medial malleolus fracture. Electronically Signed   By: Layla Maw M.D.   On: 02/22/2023 12:36    Procedures Procedures  Medications Ordered in ED Medications  oxyCODONE-acetaminophen (PERCOCET/ROXICET) 5-325 MG per tablet 1 tablet (1 tablet Oral Given 02/22/23 1222)  ketorolac (TORADOL) 15 MG/ML injection 15 mg (15 mg Intravenous Given 02/22/23 1413)  metoCLOPramide (REGLAN) injection 10 mg (10 mg Intravenous Given 02/22/23 1413)    ED Course/ Medical Decision Making/ A&P                                 Medical Decision Making Amount and/or Complexity of Data Reviewed Labs: ordered. Radiology: ordered.  Risk Prescription drug management.   57 year old well-appearing female presenting for trauma to the right arm and right ankle and headache.  Exam notable for moderate bruise in her right upper arm about her bicep.  Given her history of DVTs and  cancer, thought prompted further evaluation with ultrasound.  Ultrasound was negative.  X-ray of the right ankle showed some mild swelling and old medial malleolus fracture but otherwise nothing acute.  X-ray of the right humerus per my read appears nonacute but patient unfortunately eloped before formal evaluation by radiology.  Also was unable to reassess patient before she eloped but do suspect that her headache is likely a migraine.  Doubt stroke or SAH or meningitis given reassuring neuroexam.  Patient eloped.        Final Clinical Impression(s) / ED Diagnoses Final diagnoses:  Pain in right upper arm  Right ankle pain, unspecified chronicity    Rx / DC Orders ED Discharge Orders     None         Gareth Eagle, PA-C 02/22/23 1550    Gwyneth Sprout, MD 02/25/23 [id number]

## 2023-02-22 NOTE — ED Notes (Signed)
Pt called out stating she was ready to leave. This RN informed pt and family that the PA would be in shortly and encouraged pt to stay for results. Pt did not want to wait and requested that her IV be taken out. IV removed by this RN, AMA form signed, pt departed with even steady gait with family.

## 2023-03-12 ENCOUNTER — Other Ambulatory Visit: Payer: Self-pay | Admitting: Cardiology

## 2023-03-12 DIAGNOSIS — I480 Paroxysmal atrial fibrillation: Secondary | ICD-10-CM

## 2023-03-12 DIAGNOSIS — I10 Essential (primary) hypertension: Secondary | ICD-10-CM

## 2023-04-03 ENCOUNTER — Other Ambulatory Visit: Payer: Self-pay | Admitting: Cardiology

## 2023-04-03 DIAGNOSIS — I480 Paroxysmal atrial fibrillation: Secondary | ICD-10-CM

## 2023-04-03 DIAGNOSIS — I10 Essential (primary) hypertension: Secondary | ICD-10-CM
